# Patient Record
Sex: Male | Born: 1960
Health system: Southern US, Community
[De-identification: ages and names within clinical notes are randomized; demographics above are authoritative.]

## PROBLEM LIST (undated history)

## (undated) DIAGNOSIS — I10 Essential (primary) hypertension: Secondary | ICD-10-CM

## (undated) DIAGNOSIS — J449 Chronic obstructive pulmonary disease, unspecified: Secondary | ICD-10-CM

## (undated) DIAGNOSIS — M069 Rheumatoid arthritis, unspecified: Secondary | ICD-10-CM

## (undated) DIAGNOSIS — K7581 Nonalcoholic steatohepatitis (NASH): Secondary | ICD-10-CM

## (undated) DIAGNOSIS — R899 Unspecified abnormal finding in specimens from other organs, systems and tissues: Secondary | ICD-10-CM

## (undated) DIAGNOSIS — I872 Venous insufficiency (chronic) (peripheral): Secondary | ICD-10-CM

## (undated) DIAGNOSIS — Z993 Dependence on wheelchair: Secondary | ICD-10-CM

## (undated) DIAGNOSIS — R945 Abnormal results of liver function studies: Secondary | ICD-10-CM

## (undated) DIAGNOSIS — E66813 Obesity, class 3: Secondary | ICD-10-CM

## (undated) DIAGNOSIS — R768 Other specified abnormal immunological findings in serum: Secondary | ICD-10-CM

## (undated) DIAGNOSIS — R0602 Shortness of breath: Secondary | ICD-10-CM

## (undated) DIAGNOSIS — E785 Hyperlipidemia, unspecified: Secondary | ICD-10-CM

## (undated) DIAGNOSIS — Z720 Tobacco use: Secondary | ICD-10-CM

## (undated) DIAGNOSIS — G894 Chronic pain syndrome: Secondary | ICD-10-CM

## (undated) DIAGNOSIS — M1990 Unspecified osteoarthritis, unspecified site: Secondary | ICD-10-CM

## (undated) DIAGNOSIS — F418 Other specified anxiety disorders: Secondary | ICD-10-CM

## (undated) DIAGNOSIS — G4733 Obstructive sleep apnea (adult) (pediatric): Secondary | ICD-10-CM

## (undated) HISTORY — DX: Unspecified abnormal finding in specimens from other organs, systems and tissues: R89.9

## (undated) HISTORY — DX: Other specified anxiety disorders: F41.8

## (undated) HISTORY — DX: Obesity, class 3: E66.813

## (undated) HISTORY — DX: Other specified abnormal immunological findings in serum: R76.8

## (undated) HISTORY — DX: Abnormal results of liver function studies: R94.5

## (undated) HISTORY — DX: Hyperlipidemia, unspecified: E78.5

## (undated) HISTORY — DX: Morbid (severe) obesity due to excess calories: E66.01

## (undated) HISTORY — DX: Unspecified osteoarthritis, unspecified site: M19.90

## (undated) HISTORY — DX: Dependence on wheelchair: Z99.3

## (undated) HISTORY — DX: Venous insufficiency (chronic) (peripheral): I87.2

## (undated) HISTORY — DX: Essential (primary) hypertension: I10

## (undated) HISTORY — DX: Tobacco use: Z72.0

## (undated) HISTORY — DX: Obstructive sleep apnea (adult) (pediatric): G47.33

## (undated) HISTORY — DX: Nonalcoholic steatohepatitis (NASH): K75.81

## (undated) HISTORY — DX: Chronic obstructive pulmonary disease, unspecified: J44.9

## (undated) HISTORY — DX: Rheumatoid arthritis, unspecified: M06.9

## (undated) HISTORY — PX: TONSILLECTOMY: SUR1361

## (undated) HISTORY — DX: Chronic pain syndrome: G89.4

---

## 1993-03-25 HISTORY — PX: LAMINECTOMY: SHX219

## 1998-10-04 ENCOUNTER — Emergency Department (HOSPITAL_COMMUNITY): Admission: EM | Admit: 1998-10-04 | Discharge: 1998-10-04 | Payer: Self-pay | Admitting: *Deleted

## 1999-09-28 ENCOUNTER — Encounter: Admission: RE | Admit: 1999-09-28 | Discharge: 1999-09-28 | Payer: Self-pay | Admitting: Neurosurgery

## 1999-09-28 ENCOUNTER — Encounter: Payer: Self-pay | Admitting: Neurosurgery

## 1999-09-29 ENCOUNTER — Encounter: Payer: Self-pay | Admitting: Neurosurgery

## 1999-09-29 ENCOUNTER — Encounter: Admission: RE | Admit: 1999-09-29 | Discharge: 1999-09-29 | Payer: Self-pay | Admitting: Neurosurgery

## 1999-11-16 ENCOUNTER — Encounter: Payer: Self-pay | Admitting: Neurosurgery

## 1999-11-16 ENCOUNTER — Ambulatory Visit (HOSPITAL_COMMUNITY): Admission: RE | Admit: 1999-11-16 | Discharge: 1999-11-16 | Payer: Self-pay | Admitting: Neurosurgery

## 1999-11-30 ENCOUNTER — Encounter: Payer: Self-pay | Admitting: Neurosurgery

## 1999-11-30 ENCOUNTER — Ambulatory Visit (HOSPITAL_COMMUNITY): Admission: RE | Admit: 1999-11-30 | Discharge: 1999-11-30 | Payer: Self-pay | Admitting: Neurosurgery

## 1999-12-14 ENCOUNTER — Ambulatory Visit (HOSPITAL_COMMUNITY): Admission: RE | Admit: 1999-12-14 | Discharge: 1999-12-14 | Payer: Self-pay | Admitting: Neurosurgery

## 1999-12-17 ENCOUNTER — Encounter: Payer: Self-pay | Admitting: Neurosurgery

## 1999-12-17 ENCOUNTER — Ambulatory Visit (HOSPITAL_COMMUNITY): Admission: RE | Admit: 1999-12-17 | Discharge: 1999-12-17 | Payer: Self-pay | Admitting: Neurosurgery

## 2003-12-28 ENCOUNTER — Ambulatory Visit: Payer: Self-pay | Admitting: Family Medicine

## 2005-11-04 ENCOUNTER — Emergency Department (HOSPITAL_COMMUNITY): Admission: EM | Admit: 2005-11-04 | Discharge: 2005-11-04 | Payer: Self-pay | Admitting: Family Medicine

## 2006-03-05 ENCOUNTER — Emergency Department (HOSPITAL_COMMUNITY): Admission: EM | Admit: 2006-03-05 | Discharge: 2006-03-05 | Payer: Self-pay | Admitting: Emergency Medicine

## 2007-03-26 HISTORY — PX: TOTAL HIP ARTHROPLASTY: SHX124

## 2007-11-19 ENCOUNTER — Inpatient Hospital Stay (HOSPITAL_COMMUNITY): Admission: RE | Admit: 2007-11-19 | Discharge: 2007-11-23 | Payer: Self-pay | Admitting: Orthopedic Surgery

## 2008-01-20 ENCOUNTER — Encounter: Admission: RE | Admit: 2008-01-20 | Discharge: 2008-03-24 | Payer: Self-pay | Admitting: Orthopedic Surgery

## 2008-05-18 ENCOUNTER — Encounter: Admission: RE | Admit: 2008-05-18 | Discharge: 2008-06-28 | Payer: Self-pay | Admitting: Orthopedic Surgery

## 2008-06-10 ENCOUNTER — Inpatient Hospital Stay (HOSPITAL_COMMUNITY): Admission: EM | Admit: 2008-06-10 | Discharge: 2008-06-22 | Payer: Self-pay | Admitting: Emergency Medicine

## 2008-06-10 ENCOUNTER — Ambulatory Visit: Payer: Self-pay | Admitting: Internal Medicine

## 2008-06-21 ENCOUNTER — Encounter: Payer: Self-pay | Admitting: Internal Medicine

## 2008-07-11 ENCOUNTER — Encounter: Payer: Self-pay | Admitting: Internal Medicine

## 2008-08-01 ENCOUNTER — Ambulatory Visit: Payer: Self-pay | Admitting: *Deleted

## 2008-08-01 ENCOUNTER — Encounter (INDEPENDENT_AMBULATORY_CARE_PROVIDER_SITE_OTHER): Payer: Self-pay | Admitting: *Deleted

## 2008-08-01 DIAGNOSIS — Z720 Tobacco use: Secondary | ICD-10-CM | POA: Insufficient documentation

## 2008-08-01 DIAGNOSIS — M109 Gout, unspecified: Secondary | ICD-10-CM

## 2008-08-01 DIAGNOSIS — F17200 Nicotine dependence, unspecified, uncomplicated: Secondary | ICD-10-CM

## 2008-08-01 DIAGNOSIS — I10 Essential (primary) hypertension: Secondary | ICD-10-CM | POA: Insufficient documentation

## 2008-08-03 DIAGNOSIS — E785 Hyperlipidemia, unspecified: Secondary | ICD-10-CM | POA: Insufficient documentation

## 2008-08-03 LAB — CONVERTED CEMR LAB
Albumin: 4.1 g/dL (ref 3.5–5.2)
Bacteria, UA: NONE SEEN
Bilirubin Urine: NEGATIVE
CO2: 25 meq/L (ref 19–32)
Calcium: 9.1 mg/dL (ref 8.4–10.5)
Chloride: 102 meq/L (ref 96–112)
Cholesterol: 251 mg/dL — ABNORMAL HIGH (ref 0–200)
GFR calc Af Amer: >60 mL/min (ref >60)
GFR calc non Af Amer: >60 mL/min (ref >60)
Glucose, Bld: 94 mg/dL (ref 70–99)
HDL: 78 mg/dL (ref >39)
Ketones, ur: NEGATIVE mg/dL
LDL Cholesterol: 132 mg/dL — ABNORMAL HIGH (ref 0–99)
Protein, ur: NEGATIVE mg/dL
RBC / HPF: NONE SEEN (ref <3)
Sodium: 141 meq/L (ref 135–145)
Specific Gravity, Urine: 1.021 (ref 1.005–1.030)
Total Bilirubin: 0.4 mg/dL (ref 0.3–1.2)
Total Protein: 7.2 g/dL (ref 6.0–8.3)
Triglycerides: 206 mg/dL — ABNORMAL HIGH (ref <150)
Urine Glucose: NEGATIVE mg/dL
Urobilinogen, UA: 0.2 (ref 0.0–1.0)
VLDL: 41 mg/dL — ABNORMAL HIGH (ref 0–40)
WBC, UA: NONE SEEN cells/hpf (ref <3)

## 2008-08-21 ENCOUNTER — Encounter: Payer: Self-pay | Admitting: Internal Medicine

## 2008-08-21 ENCOUNTER — Ambulatory Visit (HOSPITAL_BASED_OUTPATIENT_CLINIC_OR_DEPARTMENT_OTHER): Admission: RE | Admit: 2008-08-21 | Discharge: 2008-08-21 | Payer: Self-pay | Admitting: *Deleted

## 2008-08-21 DIAGNOSIS — G4733 Obstructive sleep apnea (adult) (pediatric): Secondary | ICD-10-CM

## 2008-08-21 HISTORY — DX: Obstructive sleep apnea (adult) (pediatric): G47.33

## 2008-08-26 ENCOUNTER — Encounter (INDEPENDENT_AMBULATORY_CARE_PROVIDER_SITE_OTHER): Payer: Self-pay | Admitting: *Deleted

## 2008-08-28 ENCOUNTER — Ambulatory Visit: Payer: Self-pay | Admitting: Internal Medicine

## 2008-09-07 ENCOUNTER — Encounter: Admission: RE | Admit: 2008-09-07 | Discharge: 2008-10-10 | Payer: Self-pay | Admitting: Orthopedic Surgery

## 2008-11-16 ENCOUNTER — Ambulatory Visit: Payer: Self-pay | Admitting: Internal Medicine

## 2008-11-16 DIAGNOSIS — J449 Chronic obstructive pulmonary disease, unspecified: Secondary | ICD-10-CM | POA: Insufficient documentation

## 2008-11-16 DIAGNOSIS — G4733 Obstructive sleep apnea (adult) (pediatric): Secondary | ICD-10-CM | POA: Insufficient documentation

## 2008-12-19 ENCOUNTER — Ambulatory Visit: Payer: Self-pay | Admitting: Infectious Diseases

## 2009-03-03 ENCOUNTER — Ambulatory Visit: Payer: Self-pay | Admitting: Infectious Diseases

## 2009-03-03 ENCOUNTER — Ambulatory Visit (HOSPITAL_COMMUNITY): Admission: RE | Admit: 2009-03-03 | Discharge: 2009-03-03 | Payer: Self-pay | Admitting: Infectious Diseases

## 2009-03-03 LAB — CONVERTED CEMR LAB
Bilirubin Urine: NEGATIVE
Ketones, ur: NEGATIVE mg/dL
MCHC: 33.2 g/dL (ref 30.0–36.0)
MCV: 94.8 fL (ref >=78.0)
Nitrite: NEGATIVE
Platelets: 238 10*3/uL (ref 150–400)
Urobilinogen, UA: 0.2 (ref 0.0–1.0)
pH: 6.5 (ref 5.0–8.0)

## 2009-05-10 ENCOUNTER — Encounter: Admission: RE | Admit: 2009-05-10 | Discharge: 2009-05-10 | Payer: Self-pay | Admitting: Orthopedic Surgery

## 2009-06-23 ENCOUNTER — Encounter: Payer: Self-pay | Admitting: Internal Medicine

## 2009-06-29 ENCOUNTER — Encounter: Payer: Self-pay | Admitting: Internal Medicine

## 2009-07-15 ENCOUNTER — Encounter: Admission: RE | Admit: 2009-07-15 | Discharge: 2009-07-15 | Payer: Self-pay | Admitting: Orthopedic Surgery

## 2009-08-07 ENCOUNTER — Encounter: Payer: Self-pay | Admitting: Internal Medicine

## 2009-08-17 ENCOUNTER — Encounter
Admission: RE | Admit: 2009-08-17 | Discharge: 2009-08-17 | Payer: Self-pay | Admitting: Physical Medicine and Rehabilitation

## 2009-09-27 ENCOUNTER — Ambulatory Visit: Payer: Self-pay | Admitting: Internal Medicine

## 2009-09-28 LAB — CONVERTED CEMR LAB
Bacteria, UA: NONE SEEN
Bilirubin Urine: NEGATIVE
Casts: NONE SEEN /lpf
Crystals: NONE SEEN
Hemoglobin, Urine: NEGATIVE
Specific Gravity, Urine: >1.045 — ABNORMAL HIGH (ref 1.005–1.0)
pH: 6 (ref 5.0–8.0)

## 2009-10-25 ENCOUNTER — Encounter: Payer: Self-pay | Admitting: Internal Medicine

## 2009-12-25 ENCOUNTER — Ambulatory Visit (HOSPITAL_COMMUNITY): Admission: RE | Admit: 2009-12-25 | Discharge: 2009-12-25 | Payer: Self-pay | Admitting: Orthopedic Surgery

## 2010-01-26 ENCOUNTER — Encounter: Payer: Self-pay | Admitting: Internal Medicine

## 2010-01-30 ENCOUNTER — Telehealth (INDEPENDENT_AMBULATORY_CARE_PROVIDER_SITE_OTHER): Payer: Self-pay | Admitting: *Deleted

## 2010-01-31 ENCOUNTER — Ambulatory Visit: Payer: Self-pay | Admitting: Internal Medicine

## 2010-01-31 DIAGNOSIS — E1169 Type 2 diabetes mellitus with other specified complication: Secondary | ICD-10-CM

## 2010-01-31 LAB — CONVERTED CEMR LAB
ALT: 52 units/L (ref 0–53)
BUN: 8 mg/dL (ref 6–23)
Blood Glucose, Fingerstick: 264
CO2: 27 meq/L (ref 19–32)
Calcium: 9.8 mg/dL (ref 8.4–10.5)
Chloride: 96 meq/L (ref 96–112)
Cholesterol: 221 mg/dL — ABNORMAL HIGH (ref 0–200)
Creatinine, Ser: 0.79 mg/dL (ref 0.40–1.50)
Microalb Creat Ratio: 10.4 mg/g (ref 0.0–30.0)
Microalb, Ur: 1.64 mg/dL (ref 0.00–1.89)
VLDL: 64 mg/dL — ABNORMAL HIGH (ref 0–40)

## 2010-01-31 LAB — HM DIABETES FOOT EXAM

## 2010-02-26 ENCOUNTER — Telehealth (INDEPENDENT_AMBULATORY_CARE_PROVIDER_SITE_OTHER): Payer: Self-pay | Admitting: *Deleted

## 2010-02-28 ENCOUNTER — Ambulatory Visit: Payer: Self-pay

## 2010-04-06 ENCOUNTER — Encounter: Payer: Self-pay | Admitting: Internal Medicine

## 2010-04-15 ENCOUNTER — Encounter: Payer: Self-pay | Admitting: Physical Medicine and Rehabilitation

## 2010-04-15 ENCOUNTER — Encounter: Payer: Self-pay | Admitting: Orthopedic Surgery

## 2010-04-24 NOTE — Letter (Signed)
Summary: CMN - ADVANCED   CMN - ADVANCED   Imported By: Enedina Finner 02/13/2010 11:53:11  _____________________________________________________________________  External Attachment:    Type:   Image     Comment:   External Document

## 2010-04-24 NOTE — Progress Notes (Signed)
Summary: CDE appointment confirmed/dmr  Phone Note Call from Patient Call back at Home Phone [phone]   Caller: Patient Summary of Call: left voicemail about card for diabetes that someone sent him- called patient and confirmed that he does have an appointment on 12/7 with CDE and to bring his meter.  Initial call taken by: Barnabas Harries RD,CDE,  February 26, 2010 4:34 PM

## 2010-04-24 NOTE — Letter (Signed)
Summary: ORTHOTIC AND PROSTHETIC DEVICES  ORTHOTIC AND PROSTHETIC DEVICES   Imported By: Garlan Fillers 06/30/2009 11:38:06  _____________________________________________________________________  External Attachment:    Type:   Image     Comment:   External Document

## 2010-04-24 NOTE — Assessment & Plan Note (Signed)
Summary: EST-CK/FU/MEDS/CFB   Vital Signs:  Patient profile:   50 year old male Height:      68.5 inches (173.99 cm) Weight:      326.01 pounds (148.19 kg) BMI:     49.03 Temp:     98.4 degrees F (36.89 degrees C) oral Pulse rate:   97 / minute BP sitting:   123 / 73  (right arm)  Vitals Entered By: Sander Nephew RN (September 27, 2009 2:35 PM) CC: Depression, Back Pain Is Patient Diabetic? No Pain Assessment Patient in pain? yes     Location: back, legs Intensity: 10 Type: , throbbing Onset of pain  Constant Nutritional Status BMI of > 30 = obese  Have you ever been in a relationship where you felt threatened, hurt or afraid?No   Does patient need assistance? Functional Status Self care Ambulation Wheelchair Comments When he pees it burns.  Does'nt happen all the time. Blister on  his arms.  Knot on his stomach still has a knot on it.  Needs labs drawn for Mental Health.  Needs some urine jugs if possible.  Dry mouth a lot last 6 to 8 months.   Primary Care Provider:  [name] MD  CC:  Depression and Back Pain.  History of Present Illness: Patient is a 50 yo male with PMH of OSA, HTN, tobacco abuse, morbid obesity came here for regular f/u and depression. He said his depression has been well controlled until sevral months ag his father died, now he thought depression better, no SI/HI. He also reported intermittent urgency and dysuria, no fever. He continues smoking, but has cut to 1/2 ppd, denies CP or SOB. He denies ETOH or drug use, and has tried to lose weight about 20 lbs over 6 months.    Depression History:      The patient is having a depressed mood most of the day.        Suicide risk questions reveal that he wishes that he were dead and he has thought about ending his life.  The patient denies that he feels like life is not worth living and denies that he has planned how to end his life.        Comments:  REcently lost her father.  Down about being in the wheel  chair.    Preventive Screening-Counseling & Management  Alcohol-Tobacco     Smoking Status: current     Smoking Cessation Counseling: yes     Packs/Day: 0.5     Year Started: 2003  Comments: Cutting back since December.  Problems Prior to Update: 1)  Urinary Urgency  (DDU-202.54) 2)  Dizziness and Giddiness  (ICD-780.4) 3)  Uri  (ICD-465.9) 4)  Chronic Obstructive Pulmonary Disease  (ICD-496) 5)  Obstructive Sleep Apnea  (ICD-327.23) 6)  Hyperlipidemia  (ICD-272.4) 7)  Skin Lesion  (ICD-709.9) 8)  Morbid Obesity  (ICD-278.01) 9)  Hypertension  (ICD-401.9) 10)  Depression  (ICD-311) 11)  Osteoarthritis  (ICD-715.90) 12)  Hip Replacement, Left, Hx of  (ICD-V43.64) 13)  Gout, Unspecified  (ICD-274.9) 14)  Tobacco Abuse  (ICD-305.1) 15)  Transaminases, Serum, Elevated  (ICD-790.4) 16)  Back Pain, Chronic  (ICD-724.5)  Medications Prior to Update: 1)  Bayer Low Strength 81 Mg Tbec (Aspirin) .... Take One Tablet By Mouth Once Daily 2)  Prozac 20 Mg Caps (Fluoxetine Hcl) .... Take One Tablet By Mouth Once Daily 3)  Amlodipine Besylate 10 Mg Tabs (Amlodipine Besylate) .... Take One Tablet By Mouth  Once Daily 4)  Advair Diskus 100-50 Mcg/dose  Misc (Fluticasone-Salmeterol) .... One Puff Twice Daily 5)  Ventolin Hfa 108 (90 Base) Mcg/act  Aers (Albuterol Sulfate) .Marland Kitchen.. 1-2 Puffs Every 4-6 Hours As Needed For Shortness of Breath 6)  Lisinopril 20 Mg Tabs (Lisinopril) .... Take 1 Tablet By Mouth Once A Day 7)  Alprazolam 0.5 Mg Tabs (Alprazolam) .... Take 1 Tablet By Mouth Three Times A Day An Needed For Anxiety 8)  Cvs Omeprazole 20 Mg Tbec (Omeprazole) .... Take 1 Tablet By Mouth Once A Day 9)  Meclizine Hcl 25 Mg Chew (Meclizine Hcl) .... Take One Tablet Two Times A Day  Current Medications (verified): 1)  Bayer Low Strength 81 Mg Tbec (Aspirin) .... Take One Tablet By Mouth Once Daily 2)  Prozac 20 Mg Caps (Fluoxetine Hcl) .... Take One Tablet By Mouth Once Daily 3)  Advair  Diskus 100-50 Mcg/dose  Misc (Fluticasone-Salmeterol) .... One Puff Twice Daily 4)  Ventolin Hfa 108 (90 Base) Mcg/act  Aers (Albuterol Sulfate) .Marland Kitchen.. 1-2 Puffs Every 4-6 Hours As Needed For Shortness of Breath 5)  Alprazolam 0.5 Mg Tabs (Alprazolam) .... Take 1 Tablet By Mouth Three Times A Day An Needed For Anxiety 6)  Cvs Omeprazole 20 Mg Tbec (Omeprazole) .... Take 1 Tablet By Mouth Once A Day  Allergies (verified): No Known Drug Allergies  Past History:  Past Medical History: Last updated: 08/01/2008 1. Atypical pneumonia, hospitalized 06/10/2008 2. Respiratory distress secondary to chronic obstructive pulmonary     disease or asthma exacerbation. 3. Transaminitis. 4. Hypertension. 5. Tobacco abuse. 6. Depression and anxiety. 7. Morbid obesity. 8. Osteoarthritis. 9. Gout. 10.Carpal tunnel syndrome. 11.Status post left hip replacement.   Social History: Last updated: 11/16/2008 smokes 1/2 ppd EtOH: about once a week has a 6pack. Max last month 12 beers. Denies withdrawal, eye openers, adverse consequences Remote MJ, cocaine  Risk Factors: Exercise: yes (03/03/2009)  Risk Factors: Smoking Status: current (09/27/2009) Packs/Day: 0.5 (09/27/2009)  Social History: Reviewed history from 11/16/2008 and no changes required. smokes 1/2 ppd EtOH: about once a week has a 6pack. Max last month 12 beers. Denies withdrawal, eye openers, adverse consequences Remote MJ, cocainePacks/Day:  0.5  Review of Systems  The patient denies fever, dyspnea on exertion, peripheral edema, prolonged cough, headaches, hemoptysis, and abdominal pain.    Physical Exam  General:  alert, well-developed, well-nourished, well-hydrated, and overweight-appearing.   Head:  normocephalic.   Eyes:  pupils reactive to light.   Nose:  no nasal discharge.   Mouth:  pharynx pink and moist.   Neck:  supple.   Lungs:  normal respiratory effort, no accessory muscle use, normal breath sounds, no fremitus,  no crackles, and no wheezes.   Heart:  normal rate, regular rhythm, no murmur, no gallop, and no rub.   Abdomen:  soft, non-tender, normal bowel sounds, and no distention.   Msk:  normal ROM, no joint tenderness, no joint swelling, and no joint warmth.   Pulses:  2+ Extremities:  No pitting edema. Neurologic:  alert & oriented X3, cranial nerves II-XII intact, strength normal in all extremities, and sensation intact to light touch.     Impression & Recommendations:  Problem # 1:  CHRONIC OBSTRUCTIVE PULMONARY DISEASE (ICD-496) Assessment Improved He has COPD on bronchodilators and steroids, no excerbation since last discharge. Glad he lost 20 lbs. Current smoker, advised him to quit smoking and weight loss. He would be happy to do it.  His updated medication list for this problem  includes:    Advair Diskus 100-50 Mcg/dose Misc (Fluticasone-salmeterol) ..... One puff twice daily    Ventolin Hfa 108 (90 Base) Mcg/act Aers (Albuterol sulfate) .Marland Kitchen... 1-2 puffs every 4-6 hours as needed for shortness of breath  Problem # 2:  DEPRESSION (ICD-311) Assessment: Improved His dad's recent death made his depression worse, but now he feels better. Will continue current meds including seroqel. Will refill for him xanax.   His updated medication list for this problem includes:    Prozac 20 Mg Caps (Fluoxetine hcl) .Marland Kitchen... Take one tablet by mouth once daily    Alprazolam 0.5 Mg Tabs (Alprazolam) .Marland Kitchen... Take 1 tablet by mouth three times a day an needed for anxiety  Discussed treatment options, including trial of antidpressant medication. Will refer to behavioral health. Follow-up call in in 24-48 hours and recheck in 2 weeks, sooner as needed. Patient agrees to call if any worsening of symptoms or thoughts of doing harm arise. Verified that the patient has no suicidal ideation at this time.   Problem # 3:  HYPERLIPIDEMIA (NTI-144.4) Assessment: Comment Only Will check FLP and his psycho doc. lwill need lab  result faxed to them. Orders: T-Lipid Profile [id number]) T-CMP with Estimated GFR ([id number])  Labs Reviewed: SGOT: 33 (08/01/2008)   SGPT: 46 (08/01/2008)   HDL:78 (08/01/2008)  LDL:132 (08/01/2008)  Chol:251 (08/01/2008)  Trig:206 (08/01/2008)  Problem # 4:  URINARY URGENCY (TIW-580.99) Assessment: Unchanged He has urinary dysuria and frequency. Will check UA to r/o UTI. He may have prostatitis.  Orders: T-Urinalysis ([id number])  Problem # 5:  TOBACCO ABUSE (ICD-305.1) Assessment: Comment Only  Encouraged smoking cessation and discussed different methods for smoking cessation. He would like to consider cut and stop eventually.   Complete Medication List: 1)  Bayer Low Strength 81 Mg Tbec (Aspirin) .... Take one tablet by mouth once daily 2)  Prozac 20 Mg Caps (Fluoxetine hcl) .... Take one tablet by mouth once daily 3)  Advair Diskus 100-50 Mcg/dose Misc (Fluticasone-salmeterol) .... One puff twice daily 4)  Ventolin Hfa 108 (90 Base) Mcg/act Aers (Albuterol sulfate) .Marland Kitchen.. 1-2 puffs every 4-6 hours as needed for shortness of breath 5)  Alprazolam 0.5 Mg Tabs (Alprazolam) .... Take 1 tablet by mouth three times a day an needed for anxiety 6)  Cvs Omeprazole 20 Mg Tbec (Omeprazole) .... Take 1 tablet by mouth once a day 7)  Seroquel Xr 50 Mg Xr24h-tab (Quetiapine fumarate) .... Take 1 tablet by mouth once a day  Patient Instructions: 1)  Please schedule a follow-up appointment in 6 months. 2)  We will call you if any abnormal labs. 3)  Tobacco is very bad for your health and your loved ones! You Should stop smoking!. 4)  Stop Smoking Tips: Choose a Quit date. Cut down before the Quit date. decide what you will do as a substitute when you feel the urge to smoke(gum,toothpick,exercise). 5)  It is important that you exercise regularly at least 20 minutes 5 times a week. If you develop chest pain, have severe difficulty breathing, or feel very tired , stop exercising immediately  and seek medical attention. 6)  You need to lose weight. Consider a lower calorie diet and regular exercise.  Prescriptions: ALPRAZOLAM 0.5 MG TABS (ALPRAZOLAM) Take 1 tablet by mouth three times a day an needed for anxiety  #60 x 0   Entered and Authorized by:   [name] MD   Signed by:   [name] MD on 09/27/2009   Method used:  Print then Give to Patient   RxID:   [id number]   Prevention & Chronic Care Immunizations   Influenza vaccine: Not documented   Influenza vaccine due: 11/23/2008    Tetanus booster: Not documented   Td booster deferral: Deferred  (03/03/2009)    Pneumococcal vaccine: Not documented  Other Screening   Smoking status: current  (09/27/2009)   Smoking cessation counseling: yes  (09/27/2009)  Lipids   Total Cholesterol: 251  (08/01/2008)   Lipid panel action/deferral: Lipid Panel ordered   LDL: 132  (08/01/2008)   LDL Direct: Not documented   HDL: 78  (08/01/2008)   Triglycerides: 206  (08/01/2008)    SGOT (AST): 33  (08/01/2008)   SGPT (ALT): 46  (08/01/2008)   Alkaline phosphatase: 192  (08/01/2008)   Total bilirubin: 0.4  (08/01/2008)    Lipid flowsheet reviewed?: Yes   Progress toward LDL goal: Unchanged  Hypertension   Last Blood Pressure: 123 / 73  (09/27/2009)   Serum creatinine: 0.87  (08/01/2008)   Serum potassium 4.4  (08/01/2008)    Hypertension flowsheet reviewed?: Yes   Progress toward BP goal: At goal  Self-Management Support :   Personal Goals (by the next clinic visit) :      Personal blood pressure goal: 140/90  (12/19/2008)     Personal LDL goal: 130  (12/19/2008)    Patient will work on the following items until the next clinic visit to reach self-care goals:     Medications and monitoring: take my medicines every day, bring all of my medications to every visit  (09/27/2009)     Eating: drink diet soda or water instead of juice or soda, eat more vegetables, use fresh or frozen vegetables, eat foods  that are low in salt, eat baked foods instead of fried foods, eat fruit for snacks and desserts, limit or avoid alcohol  (09/27/2009)    Hypertension self-management support: Written self-care plan, Education handout, Pre-printed educational material, Resources for patients handout  (09/27/2009)   Hypertension self-care plan printed.   Hypertension education handout printed    Lipid self-management support: Written self-care plan, Education handout, Pre-printed educational material, Resources for patients handout  (09/27/2009)   Lipid self-care plan printed.   Lipid education handout printed      Resource handout printed.  Process Orders Check Orders Results:     [facility]: QAE not required for this insurance Tests Sent for requisitioning (September 28, 2009 6:18 PM):     09/27/2009: [facility] -- T-Lipid Profile [id number] (signed)     09/27/2009: [facility] -- T-CMP with Estimated GFR [[id number]] (signed)     09/27/2009: [facility] -- T-Urinalysis [[id number]] (signed)     Vital Signs:  Patient profile:   50 year old male Height:      68.5 inches (173.99 cm) Weight:      326.01 pounds (148.19 kg) BMI:     49.03 Temp:     98.4 degrees F (36.89 degrees C) oral Pulse rate:   97 / minute BP sitting:   123 / 73  (right arm)  Vitals Entered By: Sander Nephew RN (September 27, 2009 2:35 PM)

## 2010-04-24 NOTE — Progress Notes (Signed)
Summary: PREVENTIVE COLONOSCOPY  Phone Note Outgoing Call   Summary of Call: Patient is under the age of 55.  No Info on Colonoscopy at this time. Initial call taken by: Enedina Finner,  January 30, 2010 4:54 PM

## 2010-04-24 NOTE — Assessment & Plan Note (Signed)
Summary: CHECKUP/SB.   Vital Signs:  Patient profile:   50 year old male Height:      68.5 inches (173.99 cm) Weight:      310.0 pounds (140.91 kg) BMI:     46.62 Temp:     97.2 degrees F (36.22 degrees C) oral Pulse rate:   91 / minute BP sitting:   132 / 95  (right arm) Cuff size:   large  Vitals Entered By: Mateo Flow Deborra Medina) (January 31, 2010 10:50 AM) CC: f/u elevated blood glucoses Is Patient Diabetic? Yes Did you bring your meter with you today? none-rx given today Pain Assessment Patient in pain? no      Nutritional Status BMI of > 30 = obese CBG Result 264  Have you ever been in a relationship where you felt threatened, hurt or afraid?No   Does patient need assistance? Functional Status Cook/clean Ambulation Impaired:Risk for fall, Wheelchair Comments uses a cane, arrived via w/c   Diabetic Foot Exam Foot Inspection Is there a history of a foot ulcer?              No Is there a foot ulcer now?              No Is there swelling or an abnormal foot shape?          No Are the toenails long?                No Are the toenails thick?                No Are the toenails ingrown?              No Is there heavy callous build-up?              No Is there pain in the calf muscle (Intermittent claudication) when walking?    NoIs there a claw toe deformity?              No Is there elevated skin temperature?            No Is there limited ankle dorsiflexion?            No Is there foot or ankle muscle weakness?            No  Diabetic Foot Care Education Patient educated on appropriate care of diabetic feet.  Pulse Check          Right Foot          Left Foot Posterior Tibial:        normal            normal Dorsalis Pedis:        normal            normal  High Risk Feet? No Set Next Diabetic Foot Exam here: 01/30/2011   10-g (5.07) Semmes-Weinstein Monofilament Test Performed by: Adline Peals          Right Foot          Left Foot Visual Inspection      normal           normal Test Control      normal         normal Site 1         normal         normal Site 2         normal         normal  Site 3         normal         normal Site 4         normal         normal Site 5         normal         normal Site 6         normal         normal  Impression      normal         normal   Primary Care Provider:  [name] MD  CC:  f/u elevated blood glucoses.  History of Present Illness: Patient is a 50 yo male with PMH of OSA, HTN, tobacco abuse and morbid obesity who came here for regular f/u. He has been doing well, occasionally has nocturia, no significant thirsty, polyuria or frequency. He has been trying to eat more vegetable and lost about 16 lbs. Still has exertional SOB, no chest pain, adbominal pain, fever, or diarrhea. No dizziness or palpitationm. Current smoker about 1/2 PPD, denies ETOH or drug abuse. Depression is stable on prozac. No SI/HI.   Preventive Screening-Counseling & Management  Alcohol-Tobacco     Smoking Status: current     Smoking Cessation Counseling: yes     Packs/Day: 0.5     Year Started: 2003  Problems Prior to Update: 1)  Glycosuria  (ICD-791.5) 2)  Urinary Urgency  (ICD-788.63) 3)  Dizziness and Giddiness  (ICD-780.4) 4)  Uri  (ICD-465.9) 5)  Chronic Obstructive Pulmonary Disease  (ICD-496) 6)  Obstructive Sleep Apnea  (ICD-327.23) 7)  Hyperlipidemia  (ICD-272.4) 8)  Skin Lesion  (ICD-709.9) 9)  Morbid Obesity  (ICD-278.01) 10)  Hypertension  (ICD-401.9) 11)  Depression  (ICD-311) 12)  Osteoarthritis  (ICD-715.90) 13)  Hip Replacement, Left, Hx of  (ICD-V43.64) 14)  Gout, Unspecified  (ICD-274.9) 15)  Tobacco Abuse  (ICD-305.1) 16)  Transaminases, Serum, Elevated  (ICD-790.4) 17)  Back Pain, Chronic  (ICD-724.5)  Medications Prior to Update: 1)  Bayer Low Strength 81 Mg Tbec (Aspirin) .... Take One Tablet By Mouth Once Daily 2)  Prozac 20 Mg Caps (Fluoxetine Hcl) .... Take One Tablet By Mouth  Once Daily 3)  Advair Diskus 100-50 Mcg/dose  Misc (Fluticasone-Salmeterol) .... One Puff Twice Daily 4)  Ventolin Hfa 108 (90 Base) Mcg/act  Aers (Albuterol Sulfate) .Marland Kitchen.. 1-2 Puffs Every 4-6 Hours As Needed For Shortness of Breath 5)  Alprazolam 0.5 Mg Tabs (Alprazolam) .... Take 1 Tablet By Mouth Three Times A Day An Needed For Anxiety 6)  Cvs Omeprazole 20 Mg Tbec (Omeprazole) .... Take 1 Tablet By Mouth Once A Day 7)  Seroquel Xr 50 Mg Xr24h-Tab (Quetiapine Fumarate) .... Take 1 Tablet By Mouth Once A Day  Current Medications (verified): 1)  Bayer Low Strength 81 Mg Tbec (Aspirin) .... Take One Tablet By Mouth Once Daily 2)  Prozac 20 Mg Caps (Fluoxetine Hcl) .... Take One Tablet By Mouth Once Daily 3)  Advair Diskus 100-50 Mcg/dose  Misc (Fluticasone-Salmeterol) .... One Puff Twice Daily 4)  Ventolin Hfa 108 (90 Base) Mcg/act  Aers (Albuterol Sulfate) .Marland Kitchen.. 1-2 Puffs Every 4-6 Hours As Needed For Shortness of Breath 5)  Alprazolam 0.5 Mg Tabs (Alprazolam) .... Take 1 Tablet By Mouth Three Times A Day An Needed For Anxiety 6)  Cvs Omeprazole 20 Mg Tbec (Omeprazole) .... Take 1 Tablet By Mouth Once A Day 7)  Prodigy No Coding Blood Gluc  Strp (Glucose Blood) .... Check Your Sugar Twice A Day 8)  Prodigy Lancets 26g  Misc (Lancets) .... Check Sugar Two Times A Day 9)  Prodigy Preferred Monitor  Devi (Blood Glucose Monitoring Suppl) .... Use As Directed. 10)  Lisinopril 10 Mg Tabs (Lisinopril) .... Take 1 Tablet By Mouth Once A Day 11)  Metformin Hcl 500 Mg Tabs (Metformin Hcl) .... Take 1 Tablet By Mouth Two Times A Day  Allergies (verified): No Known Drug Allergies  Past History:  Past Medical History: Last updated: 08/01/2008 1. Atypical pneumonia, hospitalized 06/10/2008 2. Respiratory distress secondary to chronic obstructive pulmonary     disease or asthma exacerbation. 3. Transaminitis. 4. Hypertension. 5. Tobacco abuse. 6. Depression and anxiety. 7. Morbid obesity. 8.  Osteoarthritis. 9. Gout. 10.Carpal tunnel syndrome. 11.Status post left hip replacement.   Family History: Last updated: 01/31/2010 Parents have DM.   Social History: Last updated: 11/16/2008 smokes 1/2 ppd EtOH: about once a week has a 6pack. Max last month 12 beers. Denies withdrawal, eye openers, adverse consequences Remote MJ, cocaine  Risk Factors: Smoking Status: current (01/31/2010) Packs/Day: 0.5 (01/31/2010)  Family History: Parents have DM.   Social History: Reviewed history from 11/16/2008 and no changes required. smokes 1/2 ppd EtOH: about once a week has a 6pack. Max last month 12 beers. Denies withdrawal, eye openers, adverse consequences Remote MJ, cocaine  Review of Systems       The patient complains of weight loss, dyspnea on exertion, and prolonged cough.  The patient denies anorexia, fever, chest pain, syncope, peripheral edema, hemoptysis, abdominal pain, melena, and hematochezia.    Physical Exam  General:  alert, well-developed, well-nourished, well-hydrated, and overweight-appearing.  Sitting in wheel chair. Head:  normocephalic.   Nose:  no nasal discharge.   Mouth:  pharynx pink and moist.   Neck:  supple.   Lungs:  normal respiratory effort, no accessory muscle use, normal breath sounds, and no crackles. Very mild wheezing.   Heart:  normal rate, no murmur, and no JVD.   Abdomen:  soft, non-tender, normal bowel sounds, no distention, and no masses.   Msk:  normal ROM, no joint tenderness, no joint swelling, and no joint warmth.   Pulses:  2+ Extremities:  trace left pedal edema and trace right pedal edema.   Neurologic:  alert & oriented X3, cranial nerves II-XII intact, strength normal in all extremities, sensation intact to light touch, gait normal, and DTRs symmetrical and normal.    Diabetes Management Exam:    Foot Exam (with socks and/or shoes not present):       Sensory-Monofilament:          Left foot: normal          Right foot:  normal   Impression & Recommendations:  Problem # 1:  DM (ICD-250.00) Assessment New His CBG 264 and A1C 9.6. This is newly diagnosed DM. Will start metformin and ACEIs. Provided DM education and side effects of these medications in detail. He fully understands these. Also have DM referral for detailed DM education.  His updated medication list for this problem includes:    Bayer Low Strength 81 Mg Tbec (Aspirin) .Marland Kitchen... Take one tablet by mouth once daily    Lisinopril 10 Mg Tabs (Lisinopril) .Marland Kitchen... Take 1 tablet by mouth once a day    Metformin Hcl 500 Mg Tabs (Metformin hcl) .Marland Kitchen... Take 1 tablet by mouth two times a day  Labs Reviewed: Creat: 0.87 (08/01/2008)     Orders: T-Urine  Microalbumin w/creat. ratio [id number]) T-CMP with Estimated GFR ([id number]) Diabetic Clinic Referral (Diabetic)  Labs Reviewed: Creat: 0.87 (08/01/2008)    Reviewed HgBA1c results: 9.6 (01/31/2010)  Problem # 2:  HYPERTENSION (ICD-401.9) Assessment: Deteriorated Since he has newly diagnosed DM, will start low dose of lisinopril for HTN and kidney protection. Recheck BP at next visit. BP today: 132/95 Prior BP: 123/73 (09/27/2009)  Labs Reviewed: K+: 4.4 (08/01/2008) Creat: : 0.87 (08/01/2008)   Chol: 251 (08/01/2008)   HDL: 78 (08/01/2008)   LDL: 132 (08/01/2008)   TG: 206 (08/01/2008)  His updated medication list for this problem includes:    Lisinopril 10 Mg Tabs (Lisinopril) .Marland Kitchen... Take 1 tablet by mouth once a day  Problem # 3:  CHRONIC OBSTRUCTIVE PULMONARY DISEASE (ICD-496) Assessment: Unchanged Stable, very mild wheezing, will continue bronchodilators and advised quit smoking. He would like to cut soon.  His updated medication list for this problem includes:    Advair Diskus 100-50 Mcg/dose Misc (Fluticasone-salmeterol) ..... One puff twice daily    Ventolin Hfa 108 (90 Base) Mcg/act Aers (Albuterol sulfate) .Marland Kitchen... 1-2 puffs every 4-6 hours as needed for shortness of breath  Problem  # 4:  TOBACCO ABUSE (ICD-305.1) Assessment: Unchanged  Encouraged smoking cessation and discussed different methods for smoking cessation. He would like to cut soon.  Problem # 5:  HYPERLIPIDEMIA (GHW-299.4) Assessment: Unchanged  Will check FLP and start zocor.  Orders: T-Lipid Profile [phone] [id number]) T-CMP with Estimated GFR ([id number])  Labs Reviewed: SGOT: 33 (08/01/2008)   SGPT: 46 (08/01/2008)   HDL:78 (08/01/2008)  LDL:132 (08/01/2008)  Chol:251 (08/01/2008)  Trig:206 (08/01/2008)  His updated medication list for this problem includes:    Zocor 40 Mg Tab (Simvastatin) .Marland Kitchen... Take 1 tablet by mouth each evening  Complete Medication List: 1)  Bayer Low Strength 81 Mg Tbec (Aspirin) .... Take one tablet by mouth once daily 2)  Prozac 20 Mg Caps (Fluoxetine hcl) .... Take one tablet by mouth once daily 3)  Advair Diskus 100-50 Mcg/dose Misc (Fluticasone-salmeterol) .... One puff twice daily 4)  Ventolin Hfa 108 (90 Base) Mcg/act Aers (Albuterol sulfate) .Marland Kitchen.. 1-2 puffs every 4-6 hours as needed for shortness of breath 5)  Alprazolam 0.5 Mg Tabs (Alprazolam) .... Take 1 tablet by mouth three times a day an needed for anxiety 6)  Cvs Omeprazole 20 Mg Tbec (Omeprazole) .... Take 1 tablet by mouth once a day 7)  Prodigy No Coding Blood Gluc Strp (Glucose blood) .... Check your sugar twice a day 8)  Prodigy Lancets 26g Misc (Lancets) .... Check sugar two times a day 9)  Prodigy Preferred Monitor Devi (Blood glucose monitoring suppl) .... Use as directed. 10)  Lisinopril 10 Mg Tabs (Lisinopril) .... Take 1 tablet by mouth once a day 11)  Metformin Hcl 500 Mg Tabs (Metformin hcl) .... Take 1 tablet by mouth two times a day 12)  Zocor 40 Mg Tab (Simvastatin) .... Take 1 tablet by mouth each evening  Other Orders: T-Hgb A1C (in-house) ([id number]) T- Capillary Blood Glucose (24235)  Patient Instructions: 1)  Please schedule a follow-up appointment in 3 months. 2)  Tobacco is very bad for  your health and your loved ones! You Should stop smoking!. 3)  Stop Smoking Tips: Choose a Quit date. Cut down before the Quit date. decide what you will do as a substitute when you feel the urge to smoke(gum,toothpick,exercise). 4)  It is important that you exercise regularly at least 20 minutes 5 times a week. If you develop chest  pain, have severe difficulty breathing, or feel very tired , stop exercising immediately and seek medical attention. 5)  You need to lose weight. Consider a lower calorie diet and regular exercise.  6)  Check your blood sugars regularly. If your readings are usually above : or below 70 you should contact our office. 7)  Check your feet each night for sore areas, calluses or signs of infection. Prescriptions: ZOCOR 40 MG TAB (SIMVASTATIN) Take 1 tablet by mouth each evening  #30 x 3   Entered and Authorized by:   [name] MD   Signed by:   [name] MD on 01/31/2010   Method used:   Electronically to        [facility]. [phone]* (retail)       [address] [place]  [address]       Ph: [phone] or [phone]       Fax: [phone]   RxID:   [id number] METFORMIN HCL 500 MG TABS (METFORMIN HCL) Take 1 tablet by mouth two times a day  #60 x 5   Entered and Authorized by:   [name] MD   Signed by:   [name] MD on 01/31/2010   Method used:   Electronically to        [facility]. [phone]* (retail)       [address] [place]  [address]       Ph: [phone] or [phone]       Fax: [phone]   RxID:   [id number] ALPRAZOLAM 0.5 MG TABS (ALPRAZOLAM) Take 1 tablet by mouth three times a day an needed for anxiety  #60 x 1   Entered and Authorized by:   [name] MD   Signed by:   [name] MD on 01/31/2010   Method used:   Print then Give to Patient   RxID:   [id number] LISINOPRIL 10 MG TABS (LISINOPRIL) Take 1 tablet by mouth once a day  #30 x 5   Entered and  Authorized by:   [name] MD   Signed by:   [name] MD on 01/31/2010   Method used:   Electronically to        [facility]. [phone]* (retail)       [address] [place]  [address]       Ph: [phone] or [phone]       Fax: [phone]   RxID:   [id number] PRODIGY PREFERRED MONITOR  DEVI (BLOOD GLUCOSE MONITORING SUPPL) use as directed.  #1 x 1   Entered and Authorized by:   [name] MD   Signed by:   [name] MD on 01/31/2010   Method used:   Electronically to        [facility]. [phone]* (retail)       [address], [place]  [address]       Ph: [phone] or [phone]       Fax: [phone]   RxID:   [id number] PRODIGY LANCETS 26G  MISC (LANCETS) check sugar two times a day  #60 x 5   Entered and Authorized by:   [name] MD   Signed by:   [name] MD on 01/31/2010   Method used:   Electronically to  [facility]. [phone]* (retail)       [phone] [place]  [address]       Ph: [phone] or [phone]       Fax: [phone]   RxID:   [id number] PRODIGY NO CODING BLOOD GLUC  STRP (GLUCOSE BLOOD) check your sugar twice a day  #60 x 4   Entered and Authorized by:   [name] MD   Signed by:   [name] MD on 01/31/2010   Method used:   Electronically to        [facility]. [phone]* (retail)       [address] [place]  [address]       Ph: [phone] or [phone]       Fax: [phone]   RxID:   [id number]    Orders Added: 1)  T-Hgb A1C (in-house) [17001VC] 2)  T- Capillary Blood Glucose [82948] 3)  T-Urine Microalbumin w/creat. ratio [[id number]] 4)  T-Lipid Profile [[id number]] 5)  T-CMP with Estimated GFR [[id number]] 6)  Diabetic Clinic Referral [Diabetic] 7)  Est. Patient Level IV [94496]   Process Orders Tests Sent for requisitioning (January 31, 2010 3:17 PM):     01/31/2010: [facility] -- T-Urine Microalbumin w/creat. ratio [[id number]] (signed)     01/31/2010: [facility] -- T-Lipid Profile [phone] (signed)     01/31/2010: [facility] -- T-CMP with Estimated GFR [[id number]] (signed)   Prevention & Chronic Care Immunizations   Influenza vaccine: Not documented   Influenza vaccine deferral: Refused  (01/31/2010)   Influenza vaccine due: 11/23/2008    Tetanus booster: Not documented   Td booster deferral: Deferred  (03/03/2009)    Pneumococcal vaccine: Not documented  Other Screening   Smoking status: current  (01/31/2010)   Smoking cessation counseling: yes  (01/31/2010)  Diabetes Mellitus   HgbA1C: 9.6  (01/31/2010)    Eye exam: Not documented    Foot exam: yes  (01/31/2010)   Foot exam action/deferral: Do today   High risk foot: No  (01/31/2010)   Foot care education: Done  (01/31/2010)   Foot exam due: 01/30/2011    Urine microalbumin/creatinine ratio: 4.7  (08/01/2008)   Urine microalbumin action/deferral: Ordered    Diabetes flowsheet reviewed?: Yes   Progress toward A1C goal: Unchanged  Lipids   Total Cholesterol: 251  (08/01/2008)   Lipid panel action/deferral: Lipid Panel ordered   LDL: 132  (08/01/2008)   LDL Direct: Not documented   HDL: 78  (08/01/2008)   Triglycerides: 206  (08/01/2008)    SGOT (AST): 33  (08/01/2008)   SGPT (ALT): 46  (08/01/2008)   Alkaline phosphatase: 192  (08/01/2008)   Total bilirubin: 0.4  (08/01/2008)    Lipid flowsheet reviewed?: Yes   Progress toward LDL goal: Unchanged  Hypertension   Last Blood Pressure: 132 / 95  (01/31/2010)   Serum creatinine: 0.87  (08/01/2008)   Serum potassium 4.4  (08/01/2008)    Hypertension flowsheet reviewed?: Yes   Progress toward BP goal: Deteriorated  Self-Management Support :   Personal Goals (by the next clinic visit) :     Personal A1C goal: 7  (01/31/2010)     Personal blood pressure goal: 130/80   (01/31/2010)     Personal LDL goal: 100  (01/31/2010)    Patient will work on the following items until the next clinic visit  to reach self-care goals:     Medications and monitoring: take my medicines every day, check my blood sugar  (01/31/2010)     Eating: eat more vegetables  (01/31/2010)    Diabetes self-management support: Written self-care plan, Education handout  (01/31/2010)   Diabetes care plan printed   Diabetes education handout printed   Referred for diabetes self-mgmt training.    Hypertension self-management support: Written self-care plan, Education handout  (01/31/2010)   Hypertension self-care plan printed.   Hypertension education handout printed    Lipid self-management support: Written self-care plan  (01/31/2010)   Lipid self-care plan printed.   Nursing Instructions: Diabetic foot exam today     Laboratory Results   Blood Tests   Date/Time Received: January 31, 2010 11:15 AM Date/Time Reported: Maryan Rued  January 31, 2010 11:15 AM   HGBA1C: 9.6%   (Normal Range: Non-Diabetic - 3-6%   Control Diabetic - 6-8%) CBG Random:: 242m/dL     Process Orders Tests Sent for requisitioning (January 31, 2010 3:17 PM):     01/31/2010: [facility] -- T-Urine Microalbumin w/creat. ratio [[id number]] (signed)     01/31/2010: [facility] -- T-Lipid Profile [phone] [id number](signed)     01/31/2010: [facility] -- T-CMP with Estimated GFR [[[id number]](signed)

## 2010-04-24 NOTE — Letter (Signed)
Summary: ADVANCED CMN ORDER  ADVANCED CMN ORDER   Imported By: Enedina Finner 10/30/2009 16:51:12  _____________________________________________________________________  External Attachment:    Type:   Image     Comment:   External Document

## 2010-04-24 NOTE — Letter (Signed)
Summary: MEDICAL EQUIPMENT AND ORTHOTIC AND PROSTHETIC DEVICES  MEDICAL EQUIPMENT AND ORTHOTIC AND PROSTHETIC DEVICES   Imported By: Garlan Fillers 07/04/2009 15:36:52  _____________________________________________________________________  External Attachment:    Type:   Image     Comment:   External Document

## 2010-04-26 NOTE — Letter (Signed)
Summary: ADVANCED-CMN  ADVANCED-CMN   Imported By: Enedina Finner 04/06/2010 11:28:15  _____________________________________________________________________  External Attachment:    Type:   Image     Comment:   External Document

## 2010-04-27 ENCOUNTER — Ambulatory Visit: Payer: Self-pay | Admitting: Internal Medicine

## 2010-04-27 ENCOUNTER — Ambulatory Visit: Payer: Self-pay | Admitting: Ophthalmology

## 2010-05-24 ENCOUNTER — Ambulatory Visit (INDEPENDENT_AMBULATORY_CARE_PROVIDER_SITE_OTHER): Payer: Medicaid Other | Admitting: Internal Medicine

## 2010-05-24 ENCOUNTER — Encounter: Payer: Self-pay | Admitting: Internal Medicine

## 2010-05-24 VITALS — BP 110/70 | HR 80 | Temp 97.7°F | Resp 18 | Ht 68.5 in | Wt 312.2 lb

## 2010-05-24 DIAGNOSIS — E119 Type 2 diabetes mellitus without complications: Secondary | ICD-10-CM

## 2010-05-24 DIAGNOSIS — F329 Major depressive disorder, single episode, unspecified: Secondary | ICD-10-CM

## 2010-05-24 DIAGNOSIS — M549 Dorsalgia, unspecified: Secondary | ICD-10-CM

## 2010-05-24 DIAGNOSIS — J449 Chronic obstructive pulmonary disease, unspecified: Secondary | ICD-10-CM

## 2010-05-24 MED ORDER — TRAMADOL HCL 50 MG PO TABS: 50.0000 mg | ORAL_TABLET | Freq: Four times a day (QID) | ORAL | Status: DC | PRN

## 2010-05-24 MED ORDER — ACCU-CHEK MULTICLIX LANCETS MISC: 1.0000 | Freq: Two times a day (BID) | Status: DC

## 2010-05-24 MED ORDER — ALPRAZOLAM 0.5 MG PO TABS: 0.5000 mg | ORAL_TABLET | Freq: Three times a day (TID) | ORAL | Status: DC | PRN

## 2010-05-24 MED ORDER — ACCU-CHEK AVIVA KIT: 1.0000 | PACK | Status: DC

## 2010-05-24 MED ORDER — GLUCOSE BLOOD VI STRP: 1.0000 | ORAL_STRIP | Freq: Two times a day (BID) | Status: DC

## 2010-05-24 NOTE — Assessment & Plan Note (Addendum)
He has run out of xanax and wants to refill. Denies SI/HI. Will continue Prozac and refill xanax for him.

## 2010-05-24 NOTE — Assessment & Plan Note (Signed)
Now has not used his bronchodilators for several months, no SOB or wheezing. Has cut smoking a lot. Will stop advair and continue albuterol prn. Encourage to stop smoking.

## 2010-05-24 NOTE — Assessment & Plan Note (Signed)
CBG well controlled, most time <200, and current A1C 6.0. Will continue current metformin dose. He has not done eye exam yet. Will have eye referral for annual eye exam.

## 2010-05-24 NOTE — Patient Instructions (Signed)
Please try to cut your smoking and eventually quit it.

## 2010-05-24 NOTE — Assessment & Plan Note (Signed)
He still has chronic back pain and ibuprofen does not help too much. So will give him tramadol and advised him to continue weight loss.

## 2010-05-24 NOTE — Progress Notes (Signed)
  Subjective:    Patient ID: Andrew Fowler, male    DOB: 05/16/1960, 50 y.o.   MRN: [id number]  HPI Pt is a 50 yo male with PMH of DM, HTN, morbid obesity, OSA, tobacco abuse who came here for regular f/u. He has no c/o, including SOB, wheezing, CP, or fever. He has not used his bronchodilators for several months. He still has back pain and ibuprofen does not help too much. HisCBGhas been well controlled and most time less than 200. No abdominal pain or diarrhea. Now cut his smoking to 1/2 PPD from 2 PPD.       Review of Systems  Constitutional: Negative for fever, chills and fatigue.  Respiratory: Negative for apnea, cough, chest tightness, shortness of breath and wheezing.   Cardiovascular: Negative for chest pain, palpitations and leg swelling.  Gastrointestinal: Negative for nausea, abdominal pain, diarrhea, blood in stool, abdominal distention and anal bleeding.  Genitourinary: Negative for dysuria.  Musculoskeletal: Positive for back pain and arthralgias.  Skin: Negative.   Neurological: Positive for weakness. Negative for light-headedness.       Objective:   Physical Exam  Constitutional: He is oriented to person, place, and time. He appears well-developed and well-nourished.  HENT:  Head: Normocephalic and atraumatic.  Mouth/Throat: No oropharyngeal exudate.  Eyes: Conjunctivae and EOM are normal. Pupils are equal, round, and reactive to light.  Neck: Normal range of motion. Neck supple. No thyromegaly present.  Cardiovascular: Normal rate, regular rhythm, normal heart sounds and intact distal pulses.   Pulmonary/Chest: Effort normal and breath sounds normal. No respiratory distress. He has no wheezes. He exhibits no tenderness.  Abdominal: Soft. Bowel sounds are normal. He exhibits no distension. There is no tenderness. There is no rebound.  Neurological: He is alert and oriented to person, place, and time. He has normal reflexes.          Assessment & Plan:

## 2010-06-04 ENCOUNTER — Other Ambulatory Visit: Payer: Self-pay | Admitting: Internal Medicine

## 2010-06-04 DIAGNOSIS — E785 Hyperlipidemia, unspecified: Secondary | ICD-10-CM

## 2010-06-05 LAB — GLUCOSE, CAPILLARY: Glucose-Capillary: 264 mg/dL — ABNORMAL HIGH (ref 70–99)

## 2010-06-07 LAB — COMPREHENSIVE METABOLIC PANEL
Albumin: 3.3 g/dL — ABNORMAL LOW (ref 3.5–5.2)
BUN: 3 mg/dL — ABNORMAL LOW (ref 6–23)
Creatinine, Ser: 0.65 mg/dL (ref 0.4–1.5)
Potassium: 4.1 mEq/L (ref 3.5–5.1)
Total Protein: 5.9 g/dL — ABNORMAL LOW (ref 6.0–8.3)

## 2010-06-07 LAB — CBC
Hemoglobin: 16.3 g/dL (ref 13.0–17.0)
Platelets: 145 10*3/uL — ABNORMAL LOW (ref 150–400)
RBC: 5 MIL/uL (ref 4.22–5.81)
WBC: 7.9 10*3/uL (ref 4.0–10.5)

## 2010-06-07 LAB — PROTIME-INR: INR: 0.82 (ref 0.00–1.49)

## 2010-06-07 LAB — APTT: aPTT: 24 seconds (ref 24–37)

## 2010-06-07 LAB — SURGICAL PCR SCREEN
MRSA, PCR: NEGATIVE
Staphylococcus aureus: NEGATIVE

## 2010-06-24 ENCOUNTER — Inpatient Hospital Stay (HOSPITAL_COMMUNITY)
Admission: EM | Admit: 2010-06-24 | Discharge: 2010-06-26 | DRG: 641 | Disposition: A | Discharge status: Home / Self Care | Payer: Medicaid Other | Attending: Infectious Diseases | Admitting: Infectious Diseases

## 2010-06-24 ENCOUNTER — Emergency Department (HOSPITAL_COMMUNITY): Payer: Medicaid Other

## 2010-06-24 DIAGNOSIS — W19XXXA Unspecified fall, initial encounter: Secondary | ICD-10-CM

## 2010-06-24 DIAGNOSIS — M109 Gout, unspecified: Secondary | ICD-10-CM | POA: Diagnosis present

## 2010-06-24 DIAGNOSIS — G4733 Obstructive sleep apnea (adult) (pediatric): Secondary | ICD-10-CM | POA: Diagnosis present

## 2010-06-24 DIAGNOSIS — E119 Type 2 diabetes mellitus without complications: Secondary | ICD-10-CM | POA: Diagnosis present

## 2010-06-24 DIAGNOSIS — F341 Dysthymic disorder: Secondary | ICD-10-CM | POA: Diagnosis present

## 2010-06-24 DIAGNOSIS — E662 Morbid (severe) obesity with alveolar hypoventilation: Secondary | ICD-10-CM | POA: Diagnosis present

## 2010-06-24 DIAGNOSIS — Z96649 Presence of unspecified artificial hip joint: Secondary | ICD-10-CM

## 2010-06-24 DIAGNOSIS — R7989 Other specified abnormal findings of blood chemistry: Secondary | ICD-10-CM | POA: Diagnosis present

## 2010-06-24 DIAGNOSIS — M25559 Pain in unspecified hip: Secondary | ICD-10-CM

## 2010-06-24 DIAGNOSIS — E871 Hypo-osmolality and hyponatremia: Principal | ICD-10-CM | POA: Diagnosis present

## 2010-06-24 DIAGNOSIS — F1010 Alcohol abuse, uncomplicated: Secondary | ICD-10-CM | POA: Diagnosis present

## 2010-06-24 DIAGNOSIS — I10 Essential (primary) hypertension: Secondary | ICD-10-CM | POA: Diagnosis present

## 2010-06-24 DIAGNOSIS — G5600 Carpal tunnel syndrome, unspecified upper limb: Secondary | ICD-10-CM | POA: Diagnosis present

## 2010-06-24 DIAGNOSIS — W1809XA Striking against other object with subsequent fall, initial encounter: Secondary | ICD-10-CM | POA: Diagnosis present

## 2010-06-24 LAB — BASIC METABOLIC PANEL
CO2: 20 mEq/L (ref 19–32)
Calcium: 8.5 mg/dL (ref 8.4–10.5)
Chloride: 94 mEq/L — ABNORMAL LOW (ref 96–112)
GFR calc Af Amer: >60 mL/min (ref >60)
Sodium: 128 mEq/L — ABNORMAL LOW (ref 135–145)

## 2010-06-24 LAB — CBC
HCT: 42.3 % (ref 39.0–52.0)
Hemoglobin: 14.5 g/dL (ref 13.0–17.0)
RDW: 14.2 % (ref 11.5–15.5)
WBC: 13.4 10*3/uL — ABNORMAL HIGH (ref 4.0–10.5)

## 2010-06-24 LAB — DIFFERENTIAL
Basophils Absolute: 0.1 10*3/uL (ref 0.0–0.1)
Lymphocytes Relative: 21 % (ref 12–46)
Neutro Abs: 9.4 10*3/uL — ABNORMAL HIGH (ref 1.7–7.7)
Neutrophils Relative %: 70 % (ref 43–77)

## 2010-06-25 DIAGNOSIS — M25559 Pain in unspecified hip: Secondary | ICD-10-CM

## 2010-06-25 DIAGNOSIS — Z9181 History of falling: Secondary | ICD-10-CM

## 2010-06-25 LAB — COMPREHENSIVE METABOLIC PANEL
ALT: 42 U/L (ref 0–53)
AST: 38 U/L — ABNORMAL HIGH (ref 0–37)
Alkaline Phosphatase: 156 U/L — ABNORMAL HIGH (ref 39–117)
CO2: 22 mEq/L (ref 19–32)
Chloride: 104 mEq/L (ref 96–112)
GFR calc Af Amer: >60 mL/min (ref >60)
GFR calc non Af Amer: >60 mL/min (ref >60)
Potassium: 4.3 mEq/L (ref 3.5–5.1)
Sodium: 136 mEq/L (ref 135–145)
Total Bilirubin: <0.1 mg/dL — ABNORMAL LOW (ref 0.3–1.2)

## 2010-06-25 LAB — MAGNESIUM: Magnesium: 2.1 mg/dL (ref 1.5–2.5)

## 2010-06-25 LAB — URINALYSIS, ROUTINE W REFLEX MICROSCOPIC
Hgb urine dipstick: NEGATIVE
Specific Gravity, Urine: 1.009 (ref 1.005–1.030)
Urobilinogen, UA: 0.2 mg/dL (ref 0.0–1.0)

## 2010-06-25 LAB — GLUCOSE, CAPILLARY
Glucose-Capillary: 199 mg/dL — ABNORMAL HIGH (ref 70–99)
Glucose-Capillary: 130 mg/dL — ABNORMAL HIGH (ref 70–99)
Glucose-Capillary: 110 mg/dL — ABNORMAL HIGH (ref 70–99)

## 2010-06-25 LAB — PHOSPHORUS: Phosphorus: 3.6 mg/dL (ref 2.3–4.6)

## 2010-06-25 LAB — SODIUM, URINE, RANDOM: Sodium, Ur: 20 mEq/L

## 2010-06-25 LAB — CREATININE, URINE, RANDOM: Creatinine, Urine: 20.9 mg/dL

## 2010-06-25 LAB — CBC
Hemoglobin: 12.5 g/dL — ABNORMAL LOW (ref 13.0–17.0)
MCH: 30.4 pg (ref 26.0–34.0)
RBC: 4.11 MIL/uL — ABNORMAL LOW (ref 4.22–5.81)
WBC: 7.8 10*3/uL (ref 4.0–10.5)

## 2010-06-25 LAB — OSMOLALITY, URINE: Osmolality, Ur: 160 mOsm/kg — ABNORMAL LOW (ref 390–1090)

## 2010-06-25 LAB — LIPID PANEL: VLDL: 28 mg/dL (ref 0–40)

## 2010-06-25 LAB — RAPID URINE DRUG SCREEN, HOSP PERFORMED: Cocaine: NOT DETECTED

## 2010-06-26 LAB — CARDIAC PANEL(CRET KIN+CKTOT+MB+TROPI): CK, MB: 1.4 ng/mL (ref 0.3–4.0)

## 2010-06-26 LAB — BASIC METABOLIC PANEL
BUN: 9 mg/dL (ref 6–23)
CO2: 26 mEq/L (ref 19–32)
Calcium: 8.6 mg/dL (ref 8.4–10.5)
GFR calc non Af Amer: >60 mL/min (ref >60)
Glucose, Bld: 109 mg/dL — ABNORMAL HIGH (ref 70–99)
Potassium: 4 mEq/L (ref 3.5–5.1)

## 2010-07-05 LAB — CBC
HCT: 43.3 % (ref 39.0–52.0)
HCT: 46.2 % (ref 39.0–52.0)
HCT: 47.2 % (ref 39.0–52.0)
HCT: 48.6 % (ref 39.0–52.0)
HCT: 48.8 % (ref 39.0–52.0)
HCT: 49.2 % (ref 39.0–52.0)
Hemoglobin: 15.1 g/dL (ref 13.0–17.0)
Hemoglobin: 15.6 g/dL (ref 13.0–17.0)
Hemoglobin: 16.5 g/dL (ref 13.0–17.0)
Hemoglobin: 16.7 g/dL (ref 13.0–17.0)
Hemoglobin: 17 g/dL (ref 13.0–17.0)
Hemoglobin: 17 g/dL (ref 13.0–17.0)
Hemoglobin: 17.3 g/dL — ABNORMAL HIGH (ref 13.0–17.0)
MCHC: 33.9 g/dL (ref 30.0–36.0)
MCHC: 34.3 g/dL (ref 30.0–36.0)
MCHC: 34.3 g/dL (ref 30.0–36.0)
MCHC: 34.5 g/dL (ref 30.0–36.0)
MCHC: 34.5 g/dL (ref 30.0–36.0)
MCHC: 34.8 g/dL (ref 30.0–36.0)
MCV: 95.2 fL (ref 78.0–100.0)
MCV: 95.3 fL (ref 78.0–100.0)
MCV: 95.8 fL (ref 78.0–100.0)
MCV: 95.8 fL (ref 78.0–100.0)
MCV: 96.2 fL (ref 78.0–100.0)
MCV: 96.4 fL (ref 78.0–100.0)
Platelets: 189 10*3/uL (ref 150–400)
Platelets: 191 10*3/uL (ref 150–400)
Platelets: 191 10*3/uL (ref 150–400)
Platelets: 196 10*3/uL (ref 150–400)
Platelets: 205 10*3/uL (ref 150–400)
RBC: 4.49 MIL/uL (ref 4.22–5.81)
RBC: 4.55 MIL/uL (ref 4.22–5.81)
RBC: 4.66 MIL/uL (ref 4.22–5.81)
RBC: 4.68 MIL/uL (ref 4.22–5.81)
RBC: 4.89 MIL/uL (ref 4.22–5.81)
RBC: 4.99 MIL/uL (ref 4.22–5.81)
RBC: 5.12 MIL/uL (ref 4.22–5.81)
RBC: 5.14 MIL/uL (ref 4.22–5.81)
RBC: 5.18 MIL/uL (ref 4.22–5.81)
RBC: 5.25 MIL/uL (ref 4.22–5.81)
RDW: 14.3 % (ref 11.5–15.5)
RDW: 14.4 % (ref 11.5–15.5)
RDW: 14.6 % (ref 11.5–15.5)
RDW: 14.6 % (ref 11.5–15.5)
RDW: 14.7 % (ref 11.5–15.5)
RDW: 14.9 % (ref 11.5–15.5)
WBC: 10.5 10*3/uL (ref 4.0–10.5)
WBC: 11.2 10*3/uL — ABNORMAL HIGH (ref 4.0–10.5)
WBC: 11.8 10*3/uL — ABNORMAL HIGH (ref 4.0–10.5)
WBC: 12 10*3/uL — ABNORMAL HIGH (ref 4.0–10.5)
WBC: 12.3 10*3/uL — ABNORMAL HIGH (ref 4.0–10.5)
WBC: 14.5 10*3/uL — ABNORMAL HIGH (ref 4.0–10.5)
WBC: 14.5 10*3/uL — ABNORMAL HIGH (ref 4.0–10.5)
WBC: 15.1 10*3/uL — ABNORMAL HIGH (ref 4.0–10.5)

## 2010-07-05 LAB — URINE MICROSCOPIC-ADD ON

## 2010-07-05 LAB — DIFFERENTIAL
Basophils Absolute: 0 10*3/uL (ref 0.0–0.1)
Basophils Absolute: 0 10*3/uL (ref 0.0–0.1)
Basophils Absolute: 0 10*3/uL (ref 0.0–0.1)
Basophils Absolute: 0.1 10*3/uL (ref 0.0–0.1)
Basophils Absolute: 0.1 10*3/uL (ref 0.0–0.1)
Basophils Absolute: 0.4 10*3/uL — ABNORMAL HIGH (ref 0.0–0.1)
Basophils Relative: 0 % (ref 0–1)
Basophils Relative: 0 % (ref 0–1)
Basophils Relative: 0 % (ref 0–1)
Basophils Relative: 1 % (ref 0–1)
Basophils Relative: 3 % — ABNORMAL HIGH (ref 0–1)
Eosinophils Absolute: 0 10*3/uL (ref 0.0–0.7)
Eosinophils Absolute: 0 10*3/uL (ref 0.0–0.7)
Eosinophils Absolute: 0 10*3/uL (ref 0.0–0.7)
Eosinophils Absolute: 0.1 10*3/uL (ref 0.0–0.7)
Eosinophils Absolute: 0.1 10*3/uL (ref 0.0–0.7)
Eosinophils Relative: 0 % (ref 0–5)
Eosinophils Relative: 0 % (ref 0–5)
Eosinophils Relative: 0 % (ref 0–5)
Eosinophils Relative: 0 % (ref 0–5)
Eosinophils Relative: 0 % (ref 0–5)
Lymphocytes Relative: 11 % — ABNORMAL LOW (ref 12–46)
Lymphocytes Relative: 7 % — ABNORMAL LOW (ref 12–46)
Lymphs Abs: 0.9 10*3/uL (ref 0.7–4.0)
Lymphs Abs: 1 10*3/uL (ref 0.7–4.0)
Lymphs Abs: 1.3 10*3/uL (ref 0.7–4.0)
Monocytes Absolute: 0.3 10*3/uL (ref 0.1–1.0)
Monocytes Absolute: 0.6 10*3/uL (ref 0.1–1.0)
Monocytes Absolute: 0.7 10*3/uL (ref 0.1–1.0)
Monocytes Absolute: 0.7 10*3/uL (ref 0.1–1.0)
Monocytes Relative: 5 % (ref 3–12)
Monocytes Relative: 7 % (ref 3–12)
Neutro Abs: 11 10*3/uL — ABNORMAL HIGH (ref 1.7–7.7)
Neutro Abs: 11.9 10*3/uL — ABNORMAL HIGH (ref 1.7–7.7)
Neutro Abs: 11.9 10*3/uL — ABNORMAL HIGH (ref 1.7–7.7)
Neutro Abs: 9.8 10*3/uL — ABNORMAL HIGH (ref 1.7–7.7)
Neutrophils Relative %: 81 % — ABNORMAL HIGH (ref 43–77)
Neutrophils Relative %: 82 % — ABNORMAL HIGH (ref 43–77)
Neutrophils Relative %: 89 % — ABNORMAL HIGH (ref 43–77)
Neutrophils Relative %: 90 % — ABNORMAL HIGH (ref 43–77)

## 2010-07-05 LAB — COMPREHENSIVE METABOLIC PANEL
ALT: 106 U/L — ABNORMAL HIGH (ref 0–53)
ALT: 117 U/L — ABNORMAL HIGH (ref 0–53)
ALT: 506 U/L — ABNORMAL HIGH (ref 0–53)
ALT: 848 U/L — ABNORMAL HIGH (ref 0–53)
ALT: 851 U/L — ABNORMAL HIGH (ref 0–53)
ALT: 871 U/L — ABNORMAL HIGH (ref 0–53)
AST: 177 U/L — ABNORMAL HIGH (ref 0–37)
AST: 225 U/L — ABNORMAL HIGH (ref 0–37)
AST: 259 U/L — ABNORMAL HIGH (ref 0–37)
AST: 70 U/L — ABNORMAL HIGH (ref 0–37)
AST: 71 U/L — ABNORMAL HIGH (ref 0–37)
Albumin: 2.5 g/dL — ABNORMAL LOW (ref 3.5–5.2)
Albumin: 2.6 g/dL — ABNORMAL LOW (ref 3.5–5.2)
Albumin: 2.7 g/dL — ABNORMAL LOW (ref 3.5–5.2)
Albumin: 2.9 g/dL — ABNORMAL LOW (ref 3.5–5.2)
Alkaline Phosphatase: 162 U/L — ABNORMAL HIGH (ref 39–117)
Alkaline Phosphatase: 200 U/L — ABNORMAL HIGH (ref 39–117)
Alkaline Phosphatase: 218 U/L — ABNORMAL HIGH (ref 39–117)
Alkaline Phosphatase: 219 U/L — ABNORMAL HIGH (ref 39–117)
Alkaline Phosphatase: 287 U/L — ABNORMAL HIGH (ref 39–117)
Alkaline Phosphatase: 337 U/L — ABNORMAL HIGH (ref 39–117)
Alkaline Phosphatase: 361 U/L — ABNORMAL HIGH (ref 39–117)
BUN: 12 mg/dL (ref 6–23)
BUN: 14 mg/dL (ref 6–23)
BUN: 15 mg/dL (ref 6–23)
BUN: 15 mg/dL (ref 6–23)
BUN: 16 mg/dL (ref 6–23)
CO2: 28 mEq/L (ref 19–32)
CO2: 29 mEq/L (ref 19–32)
CO2: 30 mEq/L (ref 19–32)
CO2: 31 mEq/L (ref 19–32)
CO2: 31 mEq/L (ref 19–32)
CO2: 32 mEq/L (ref 19–32)
CO2: 34 mEq/L — ABNORMAL HIGH (ref 19–32)
CO2: 35 mEq/L — ABNORMAL HIGH (ref 19–32)
Calcium: 8.5 mg/dL (ref 8.4–10.5)
Calcium: 8.9 mg/dL (ref 8.4–10.5)
Calcium: 9.4 mg/dL (ref 8.4–10.5)
Chloride: 100 mEq/L (ref 96–112)
Chloride: 102 mEq/L (ref 96–112)
Chloride: 103 mEq/L (ref 96–112)
Chloride: 93 mEq/L — ABNORMAL LOW (ref 96–112)
Chloride: 94 mEq/L — ABNORMAL LOW (ref 96–112)
Chloride: 97 mEq/L (ref 96–112)
Chloride: 99 mEq/L (ref 96–112)
Creatinine, Ser: 0.78 mg/dL (ref 0.4–1.5)
Creatinine, Ser: 0.94 mg/dL (ref 0.4–1.5)
Creatinine, Ser: 1.02 mg/dL (ref 0.4–1.5)
GFR calc Af Amer: >60 mL/min (ref >60)
GFR calc Af Amer: >60 mL/min (ref >60)
GFR calc Af Amer: >60 mL/min (ref >60)
GFR calc Af Amer: >60 mL/min (ref >60)
GFR calc Af Amer: >60 mL/min (ref >60)
GFR calc non Af Amer: >60 mL/min (ref >60)
GFR calc non Af Amer: >60 mL/min (ref >60)
GFR calc non Af Amer: >60 mL/min (ref >60)
GFR calc non Af Amer: >60 mL/min (ref >60)
GFR calc non Af Amer: >60 mL/min (ref >60)
GFR calc non Af Amer: >60 mL/min (ref >60)
Glucose, Bld: 110 mg/dL — ABNORMAL HIGH (ref 70–99)
Glucose, Bld: 115 mg/dL — ABNORMAL HIGH (ref 70–99)
Glucose, Bld: 124 mg/dL — ABNORMAL HIGH (ref 70–99)
Glucose, Bld: 130 mg/dL — ABNORMAL HIGH (ref 70–99)
Glucose, Bld: 169 mg/dL — ABNORMAL HIGH (ref 70–99)
Potassium: 3.5 mEq/L (ref 3.5–5.1)
Potassium: 3.6 mEq/L (ref 3.5–5.1)
Potassium: 3.8 mEq/L (ref 3.5–5.1)
Potassium: 3.9 mEq/L (ref 3.5–5.1)
Potassium: 4 mEq/L (ref 3.5–5.1)
Potassium: 4.8 mEq/L (ref 3.5–5.1)
Potassium: 5.2 mEq/L — ABNORMAL HIGH (ref 3.5–5.1)
Sodium: 136 mEq/L (ref 135–145)
Sodium: 136 mEq/L (ref 135–145)
Sodium: 136 mEq/L (ref 135–145)
Sodium: 137 mEq/L (ref 135–145)
Sodium: 138 mEq/L (ref 135–145)
Sodium: 140 mEq/L (ref 135–145)
Total Bilirubin: 0.7 mg/dL (ref 0.3–1.2)
Total Bilirubin: 0.9 mg/dL (ref 0.3–1.2)
Total Bilirubin: 1 mg/dL (ref 0.3–1.2)
Total Bilirubin: 1 mg/dL (ref 0.3–1.2)
Total Bilirubin: 1.5 mg/dL — ABNORMAL HIGH (ref 0.3–1.2)
Total Bilirubin: 1.7 mg/dL — ABNORMAL HIGH (ref 0.3–1.2)
Total Protein: 6.1 g/dL (ref 6.0–8.3)
Total Protein: 7 g/dL (ref 6.0–8.3)
Total Protein: 7.1 g/dL (ref 6.0–8.3)

## 2010-07-05 LAB — LIPID PANEL
HDL: 61 mg/dL (ref >39)
LDL Cholesterol: 111 mg/dL — ABNORMAL HIGH (ref 0–99)
Triglycerides: 77 mg/dL (ref <150)
VLDL: 15 mg/dL (ref 0–40)

## 2010-07-05 LAB — BASIC METABOLIC PANEL
BUN: 13 mg/dL (ref 6–23)
CO2: 27 mEq/L (ref 19–32)
CO2: 28 mEq/L (ref 19–32)
Chloride: 96 mEq/L (ref 96–112)
Chloride: 98 mEq/L (ref 96–112)
GFR calc Af Amer: >60 mL/min (ref >60)
GFR calc non Af Amer: >60 mL/min (ref >60)
Glucose, Bld: 144 mg/dL — ABNORMAL HIGH (ref 70–99)
Potassium: 3.9 mEq/L (ref 3.5–5.1)
Potassium: 4.4 mEq/L (ref 3.5–5.1)
Sodium: 134 mEq/L — ABNORMAL LOW (ref 135–145)
Sodium: 135 mEq/L (ref 135–145)

## 2010-07-05 LAB — URINALYSIS, ROUTINE W REFLEX MICROSCOPIC
Glucose, UA: NEGATIVE mg/dL
Leukocytes, UA: NEGATIVE
Nitrite: NEGATIVE
Specific Gravity, Urine: 1.013 (ref 1.005–1.030)
pH: 7 (ref 5.0–8.0)

## 2010-07-05 LAB — BLOOD GAS, ARTERIAL
Bicarbonate: 28.8 mEq/L — ABNORMAL HIGH (ref 20.0–24.0)
O2 Content: 3 L/min
Patient temperature: 98.6
TCO2: 30.5 mmol/L (ref 0–100)
pCO2 arterial: 53.6 mmHg — ABNORMAL HIGH (ref 35.0–45.0)
pH, Arterial: 7.35 (ref 7.350–7.450)
pO2, Arterial: 70.3 mmHg — ABNORMAL LOW (ref 80.0–100.0)

## 2010-07-05 LAB — ALPHA-1-ANTITRYPSIN: A-1 Antitrypsin, Ser: 126 mg/dL (ref 83–200)

## 2010-07-05 LAB — CARDIAC PANEL(CRET KIN+CKTOT+MB+TROPI)
CK, MB: 2.4 ng/mL (ref 0.3–4.0)
CK, MB: 3.2 ng/mL (ref 0.3–4.0)
Relative Index: 1.4 (ref 0.0–2.5)
Total CK: 132 U/L (ref 7–232)
Troponin I: 0.01 ng/mL (ref 0.00–0.06)

## 2010-07-05 LAB — VANCOMYCIN, TROUGH: Vancomycin Tr: 22.5 ug/mL — ABNORMAL HIGH (ref 10.0–20.0)

## 2010-07-05 LAB — HEPATITIS PANEL, ACUTE: Hepatitis B Surface Ag: NEGATIVE

## 2010-07-05 LAB — LEGIONELLA ANTIGEN, URINE: Legionella Antigen, Urine: NEGATIVE

## 2010-07-05 LAB — CK TOTAL AND CKMB (NOT AT ARMC)
CK, MB: 1.8 ng/mL (ref 0.3–4.0)
Relative Index: 1.5 (ref 0.0–2.5)
Total CK: 105 U/L (ref 7–232)
Total CK: 115 U/L (ref 7–232)

## 2010-07-05 LAB — HEPATIC FUNCTION PANEL
ALT: 74 U/L — ABNORMAL HIGH (ref 0–53)
Albumin: 3.5 g/dL (ref 3.5–5.2)
Alkaline Phosphatase: 246 U/L — ABNORMAL HIGH (ref 39–117)
Indirect Bilirubin: 0.7 mg/dL (ref 0.3–0.9)
Total Protein: 7.3 g/dL (ref 6.0–8.3)

## 2010-07-05 LAB — RAPID URINE DRUG SCREEN, HOSP PERFORMED
Barbiturates: NOT DETECTED
Benzodiazepines: NOT DETECTED

## 2010-07-05 LAB — CULTURE, RESPIRATORY W GRAM STAIN

## 2010-07-05 LAB — EXPECTORATED SPUTUM ASSESSMENT W GRAM STAIN, RFLX TO RESP C

## 2010-07-05 LAB — CULTURE, BLOOD (ROUTINE X 2): Culture: NO GROWTH

## 2010-07-05 LAB — TROPONIN I: Troponin I: <0.01 ng/mL (ref 0.00–0.06)

## 2010-07-05 LAB — HIV ANTIBODY (ROUTINE TESTING W REFLEX): HIV: NONREACTIVE

## 2010-07-05 LAB — RHEUMATOID FACTOR: Rhuematoid fact SerPl-aCnc: <20 IU/mL (ref 0–20)

## 2010-07-05 LAB — CERULOPLASMIN: Ceruloplasmin: 33 mg/dL (ref 21–63)

## 2010-07-09 NOTE — Discharge Summary (Signed)
Andrew Fowler, VANDENBRINK               ACCOUNT NO.:  [id number]  MEDICAL RECORD NO.:  [id number]           PATIENT TYPE:  I  LOCATION:  5400                         FACILITY:  [facility]  PHYSICIAN:  Alison Murray, M.D.  DATE OF BIRTH:  05-10-1960  DATE OF ADMISSION:  06/24/2010 DATE OF DISCHARGE:  06/26/2010                              DISCHARGE SUMMARY   PRIMARY CARE PHYSICIAN:  [name], MD  DISCHARGE DIAGNOSES: 1. Hyponatremia secondary to dehydration and excessive beer intake     resolved at the time of discharge. 2. Alcohol abuse.  The patient says he drinks about 18 14 ounces beer     a day at this admission, has been drinking since the age of 73. 3. Chronic obstructive pulmonary disease or asthma exacerbation.  The     patient does not have any pulmonary function test on file. 4. Elevated liver enzymes secondary to alcohol abuse, negative workup     including abdominal ultrasound, hepatitis serology, ANA, rheumatoid     factor, ceruloplasmin, CMV serologies, alpha-1 antitrypsin and AMA     in the past. 5. Hypertension. 6. Depression and anxiety. 7. Morbid obesity.  The patient's weight at the time of this admission     was 147.8 kg. 8. Osteoarthritis status post total left hip replacement. 9. Gout. 10.Carpal tunnel syndrome. 11.Obstructive sleep apnea/obesity hypoventilation syndrome.  The     patient is using CPAP at home although it is unsure how compliant     he is with it. 12.Diabetes mellitus.  The patient's last HbA1c in March was 6 been     treated with oral hypoglycemic agents.  DISCHARGE MEDICATIONS: 1. Advair 1 puff twice a day. 2. Protonix 40 mg 1 tablet by mouth twice a day. 3. Fluoxetine 20 mg 1 tablet by mouth daily. 4. Ibuprofen 200 mg 4 tablets by mouth twice a day as needed for pain. 5. Lisinopril 10 mg 1 tablet by mouth daily. 6. Metformin 500 mg 1 tablet by mouth twice a day. 7. Simvastatin 40 mg 1 tablet by mouth every evening. 8. Xanax 0.5  mg 1 tablet by mouth daily as needed for anxiety.  DISPOSITION AND FOLLOWUP:  The patient will be followed up by [facility], an appointment has not been set up at the time of discharge but we will call the patient with an appointment within next 2 weeks of discharge.  At that time, follow up on his basic metabolic panel to check for persistent hyponatremia and any hypokalemia.  Also, the patient was counseled extensively about alcohol abuse.  This will need to be reinforced during outpatient clinic visit.  No other acute issues needs to be followed up for this hospitalization during the Outpatient Clinic.  Please continue his regular chronic medical problem management as before in the clinic.  ADMISSION HISTORY AND PHYSICAL:  A 50 year old gentleman with past medical history as dictated below presents to the emergency room for fall.  He reports drinking 18 beer in 4 hours duration with his brother on the day of admission and when he got up from his wheelchair his left leg  gave out and he fell on his left side and hit his face on the table. He is by the way wheelchair-bound 90% of the time during the day.  He denied any loss of consciousness, shortness of breath, chest pain, dizziness, diarrhea, cough, numbness or tingling in his left leg.  He reports bilateral leg pain from his ankle up to his groin area which he could not bear weight, left greater than right for past several months and was last seen by Dr. Sharol Given, orthopedist in October 2011.  Per the patient, Dr. Sharol Given does not think that his right hip pain is bad enough to require total hip replacement as of yet.  He denied any trauma/laceration to his left leg or thigh.  In the emergency room, blood pressure was found to be 64/42, tachycardia at rate of 103.  The patient received 3 liters of normal saline and his blood pressure trended up to 130/90 and stayed stable since then.  He remained asymptomatic after his  presentation.  PHYSICAL EXAMINATION:  VITAL SIGNS:  At admission temperature 97, pulse 103, blood pressure 130/90 when we saw the patient, respiration 18, oxygen saturation 97% on room air. GENERAL:  Alert, well developed no acute distress. NECK:  Supple.  No JVD. LUNGS:  Distant breath sounds bilaterally with mild expiratory wheezes bilaterally.  Normal respiratory effort.  No crackles. HEART:  Tachycardia regular rate and rhythm.  No murmurs. ABDOMEN:  Soft, obese, nontender, nondistended.  Normal bowel sounds. NEUROLOGICAL:  Cranial nerves II-XII intact.  Strength normal bilaterally.  Sensation is grossly intact.  Reflexes normal bilaterally. The patient was alert to time, place and person.  LABORATORY DATA:  At admission, his white count was 13.4, hemoglobin 14.5.  Sodium 128, potassium 4.2, chloride 94, bicarb 20, BUN 13, creatinine 0.98, anion gap of 14.  There was no evidence of fractures on his pelvic x-ray after his fall although it did show advanced right hip osteoarthritis and no evidence of pneumonia on his chest x-ray.  HOSPITAL COURSE: 1. Hyponatremia this was secondary to dehydration and excessive beer    intake.  We hydrated the patient aggressively during this     hospitalization and his labs trended back to normal and remained so     since then.  He tolerated regular diet prior to discharge.  We     checked his magnesium, phosphorus and other electrolytes and they     were normal prior to discharge.  He was not orthostatic at the time     of discharge.  We have encouraged him to maintain a good oral     rehydration at the time of discharge. 2. Alcohol abuse.  The patient was kept on CIWA observation during     this hospital course and he did not show any signs of withdrawal     during this hospitalization.  He was counseled extensively by the     physicians and social worker against alcohol abuse during this     hospitalization.  This will be re-enforced in his  outpatient clinic     visits. 3. Obstructive sleep apnea.  The patient was prescribed CPAP but he     refused to use it.  He also says he does not use it at home very     frequently. 4. All his other medical problems have been stable and no changes have     been made to his management.  DISCHARGE VITALS:  Temperature 98.6, pulse 93, respiration 18, blood  pressure 132/80, oxygen saturation 95% on room air.  DISCHARGE LABORATORIES:  Sodium 139, potassium 4, chloride 108, bicarb 26, BUN 9, creatinine 0.84, glucose 109, calcium 8.6.  Liver function tests show AST of 38, ALT of 42, alk phos of 156, albumin of 2.9, it seems to be at his baseline.  His lipid profile shows a cholesterol of 146, HDL of 59, LDL of 59, triglyceride of 138.  The patient has an appointment with outpatient clinic on July 11, 2010, at 1:15 p.m. with his primary care physician.     Lester [place], MD   ______________________________ Alison Murray, M.D.    MD/MEDQ  D:  06/28/2010  T:  06/28/2010  Job:  [id number]  Electronically Signed by Lester Riley  on 07/03/2010 12:21:47 PM Electronically Signed by Lars Mage M.D. on 07/09/2010 12:39:03 PM

## 2010-07-11 ENCOUNTER — Encounter: Payer: Medicaid Other | Admitting: Internal Medicine

## 2010-07-24 ENCOUNTER — Other Ambulatory Visit: Payer: Self-pay | Admitting: *Deleted

## 2010-07-24 DIAGNOSIS — J449 Chronic obstructive pulmonary disease, unspecified: Secondary | ICD-10-CM

## 2010-07-24 DIAGNOSIS — M549 Dorsalgia, unspecified: Secondary | ICD-10-CM

## 2010-07-24 DIAGNOSIS — E119 Type 2 diabetes mellitus without complications: Secondary | ICD-10-CM

## 2010-07-24 DIAGNOSIS — F329 Major depressive disorder, single episode, unspecified: Secondary | ICD-10-CM

## 2010-07-24 MED ORDER — OMEPRAZOLE 20 MG PO TBEC: 1.0000 | DELAYED_RELEASE_TABLET | Freq: Every day | ORAL | Status: DC

## 2010-07-24 MED ORDER — LISINOPRIL 10 MG PO TABS: 10.0000 mg | ORAL_TABLET | Freq: Every day | ORAL | Status: DC

## 2010-07-24 MED ORDER — ALPRAZOLAM 0.5 MG PO TABS: 0.5000 mg | ORAL_TABLET | Freq: Three times a day (TID) | ORAL | Status: DC | PRN

## 2010-07-25 NOTE — Telephone Encounter (Signed)
Rx faxed in.

## 2010-08-07 ENCOUNTER — Other Ambulatory Visit: Payer: Self-pay | Admitting: Internal Medicine

## 2010-08-07 NOTE — Procedures (Signed)
Andrew Fowler, Andrew Fowler               ACCOUNT NO.:  [id number]   MEDICAL RECORD NO.:  [id number]          PATIENT TYPE:  OUT   LOCATION:  SLEEP CENTER                 FACILITY:  [facility]   PHYSICIAN:  Clinton D. Annamaria Boots, MD, FCCP, FACPDATE OF BIRTH:  1960/05/24   DATE OF STUDY:  08/21/2008                            NOCTURNAL POLYSOMNOGRAM   REFERRING PHYSICIAN:  Lajean Saver, MD   REFERRING PHYSICIAN:  Lajean Saver, MD   INDICATION FOR STUDY:  Hypersomnia with sleep apnea.   EPWORTH SLEEPINESS SCORE:  6/24, BMI 48.5, weight 348 pounds, height 71  inches, neck 22 inches.   MEDICATIONS:  Home medications are charted and reviewed.   SLEEP ARCHITECTURE:  Split study protocol.  During the diagnostic phase,  total sleep time 143 minutes with sleep efficiency 51.7%.  Stage I was  22%, stage II 61.9%, stage III 6.3%, REM 9.8% of total sleep time.  Sleep latency 81 minutes, REM latency 181 minutes, awake after sleep  onset 52 minutes, arousal index 19.3.  No bedtime medication was taken.   RESPIRATORY DATA:  Split study protocol.  Apnea-hypopnea index (AHI)  16.4 per hour.  A total of 39 events was scored including 19 obstructive  apneas, 1 central apnea, and 19 hypopneas.  All events were recorded as  supine.  REM AHI 107.1.  CPAP was then titrated to 17 CWP, AHI 0.9 per  hour.  He wore a large Respironics FitLife mask with heated humidifier.   OXYGEN DATA:  Moderately loud snoring before CPAP with oxygen  desaturation to a nadir of 66%.  After CPAP control, mean oxygen  saturation held 94.2% on room air.   CARDIAC DATA:  Normal sinus rhythm.   MOVEMENT-PARASOMNIA:  No significant movement disturbance.  Bathroom x2.   IMPRESSIONS-RECOMMENDATIONS:  1. Moderate obstructive sleep apnea/hypopnea syndrome, apnea-hypopnea      index 16.4 per hour with most events while sleeping supine.      Moderately loud snoring with oxygen desaturation to a nadir of 66%      on room air.  2. Successful  continuous positive airway pressure titration to 17      centimeters of water pressure, apnea-hypopnea index 0.9 per hour.      He wore a large Respironics FitLife mask with heated humidifier.      Clinton D. Annamaria Boots, MD, FCCP, FACP  Diplomate, Tax adviser of Sleep Medicine  Electronically Signed     CDY/MEDQ  D:  08/28/2008 19:11:16  T:  08/29/2008 06:47:50  Job:  [id number]

## 2010-08-07 NOTE — Discharge Summary (Signed)
Andrew Fowler, Andrew Fowler               ACCOUNT NO.:  [id number]   MEDICAL RECORD NO.:  [id number]          PATIENT TYPE:  INP   LOCATION:  9242                         FACILITY:  [facility]   PHYSICIAN:  Newt Minion, MD     DATE OF BIRTH:  12/21/1960   DATE OF ADMISSION:  11/19/2007  DATE OF DISCHARGE:  11/23/2007                               DISCHARGE SUMMARY   FINAL DIAGNOSIS:  Osteoarthritis, left hip.   PROCEDURE:  Left total hip arthroplasty.   Discharged to home in stable condition with home health physical  therapy, Tylox, and Coumadin.  Followup in the office in 2 weeks.   HISTORY OF PRESENT ILLNESS:  The patient is a 50 year old gentleman with  osteoarthritis of the left hip.  He has pain with activities of daily  living, has failed conservative care, and presents at this time for  total hip arthroplasty.  The patient's hospital course was essentially  unremarkable.  He underwent a left total hip arthroplasty on November 19, 2007, with DePuy components #13 femur, 56-mm acetabulum, 49-mm head with  -1 neck.  He received Kefzol for infection prophylaxis and was started  on Coumadin for DVT prophylaxis.  Postoperatively, the patient  progressed well with his physical therapy and was discharged to home in  stable condition on November 23, 2007, with prescriptions for Tylox and  Coumadin with followup in the office in 2 weeks.      Newt Minion, MD  Electronically Signed     MVD/MEDQ  D:  01/13/2008  T:  01/13/2008  Job:  [id number]

## 2010-08-07 NOTE — Op Note (Signed)
Andrew Fowler, Andrew Fowler               ACCOUNT NO.:  [id number]   MEDICAL RECORD NO.:  [id number]          PATIENT TYPE:  INP   LOCATION:  7001                         FACILITY:  [facility]   PHYSICIAN:  Newt Minion, MD     DATE OF BIRTH:  [birth year]   DATE OF PROCEDURE:  11/19/2007  DATE OF DISCHARGE:                               OPERATIVE REPORT   PREOPERATIVE DIAGNOSIS:  Osteoarthritis, left hip.   POSTOPERATIVE DIAGNOSIS:  Osteoarthritis, left hip.   PROCEDURE:  Left total hip arthroplasty with DePuy components, #13  Corail femur, 56-mm acetabulum, 49-mm head with a -1 mm neck.   SURGEON:  Newt Minion, MD   ANESTHESIA:  General.   ESTIMATED BLOOD LOSS:  300 mL.   ANTIBIOTICS:  2 g of Kefzol.   DRAINS:  None.   COMPLICATIONS:  None.   DISPOSITION:  To PACU in stable condition.   INDICATIONS FOR PROCEDURE:  The patient is a 50 year old gentleman with  severe osteoarthritis of his left hip.  He has extension of 0 degrees,  flexion of 30 degrees, internal and external rotation of 0 degrees.  He  has failed conservative care.  He has pain with activities of daily  living and presents at this time for total hip arthroplasty.  Of note,  the patient is morbidly obese with a BMI greater than 40 with a body  weight over 350 pounds.  Risks and benefits of surgery were discussed  including infection, neurovascular injury, persistent pain, and need for  additional surgery.  The patient states he understands and wish to  proceed at this time.  The patient required extra time to perform the  procedure secondary to the morbid obesity.   DESCRIPTION OF PROCEDURE:  The patient was brought to OR 1 and underwent  a general anesthetic.  After adequate level of anesthesia obtained, the  patient was placed in the right lateral decubitus position with the left  side up and the left lower extremity was prepped using DuraPrep, draped  in a sterile field, and Ioban was used to cover all exposed  skin.  A  posterolateral incision was made, this was carried down to the tensor  fascia lata, which was split, a Charnley retractor was placed.  The  insertion of the gluteus maximus was released.  The piriformis, short  external rotators, and capsules were incised longitudinally off the neck  and this was reflected with #1 Ethibond.  The hip was dislocated and a  femoral neck cut was made 1 cm proximal to the calcar.  Attention was  then focused on the acetabulum.  The fibrinous tissue, bone spurs, and  capsule were incised from around the acetabulum.  The capsule insertion  was relaxed medially.  The acetabulum was sequentially reamed to 55 mm  from a 56-mm acetabulum.  This was impacted at 45 degrees of abduction  and 20 degrees of flexion.  Attention was then focused on the femur.  The femur was sequentially broached to a size 13, and this was tried  with a size 13 with a minus 1 neck.  The patient had flexion to greater  than 100 degrees.  He had full abduction with internal rotation of 70  degrees and his hip was stable.  The trial components were then removed.  The wounds was irrigated with normal saline throughout the case.  The  #13 Corail stem was then placed with the 49-mm head, +1 neck.  This was  reduced again, placed through range of motion, and the hip was stable.  It was again irrigated.  The piriformis short external rotators and  capsules were reapproximated using #1 Ethibond.  The tensor fascia lata  was closed using #1 Vicryl.  Subcu was closed using 2-0 Vicryl.  The  skin was closed using approximate staples.  The wound was covered with a  Mepilex dressing.  The patient was extubated, placed supine on the bed,  and was taken to PACU in stable condition with a bed pillow between his  legs.  Plan for step-down unit observation overnight due to his severe  sleep apnea and follow up expectantly with in-patient rehab.      Newt Minion, MD  Electronically  Signed     MVD/MEDQ  D:  11/19/2007  T:  11/20/2007  Job:  [id number]

## 2010-08-07 NOTE — Discharge Summary (Signed)
NAMEMELINDA, POTTINGER               ACCOUNT NO.:  [id number]   MEDICAL RECORD NO.:  [id number]          PATIENT TYPE:  INP   LOCATION:  0973                         FACILITY:  [facility]   PHYSICIAN:  Thomes Lolling, M.D.    DATE OF BIRTH:  December 29, 1960   DATE OF ADMISSION:  06/10/2008  DATE OF DISCHARGE:  06/22/2008                               DISCHARGE SUMMARY   DISCHARGE DIAGNOSES:  1. Atypical pneumonia.  2. Respiratory distress secondary to chronic obstructive pulmonary      disease or asthma exacerbation.  3. Transaminitis.  4. Hypertension.  5. Tobacco abuse.  6. Depression and anxiety.  7. Morbid obesity.  8. Osteoarthritis.  9. Gout.  10.Carpal tunnel syndrome.  11.Status post left hip replacement.   DISCHARGE MEDICATIONS:  1. HCTZ 25 mg p.o. daily.  2. Aspirin 81 mg p.o. daily.  3. Prozac 20 mg p.o. daily.  4. Seroquel 200 mg p.o. at bedtime.  5. Amlodipine 10 mg p.o. daily.  6. Advair Diskus 100/20mg, one puff b.i.d.  7. Albuterol 108 mcg 1-2 puffs every 4-6 hours p.r.n. for shortness of      breath.  8. Avelox 400 mg p.o. daily for 5 days.   DISPOSITION AND FOLLOWUP:  Mr. CWadlowwas discharged from the hospital  on June 22, 2008, in stable and improved condition.  The patient's  pneumonia and shortness of breath has significantly improved on  discharge, he has only mild wheezing and his blood pressure has been  well controlled.  He will have appointment [facility] on July 11, 2008, at 2:30 p.m.  At that  time, we need to check if his pulmonary symptoms has resolved completely  and may have sleep study referal.  Check the CMET for his liver function  because he had transaminitis during the hospitalization without  specifically identified causes.  Check his blood pressure and may need  medication adjustment if necessary.   CONSULTATIONS:  None.   PROCEDURES PERFORMED:  1. Chest x-ray on June 10, 2008, shows no  acute disease.  2. Chest CT angiogram on June 10, 2008, shows diffuse patchy ground-      glass opacities in both lungs consistent with atypical pneumonia.      No pulmonary embolism.  Mildly-to-moderately enlarged mediastinal      and hilar lymph nodes, may be reactive.  3. Abdominal ultrasound on June 11, 2008, shows no gallstones, but      fatty infiltration of the liver.   ADMISSION HISTORY:  Mr. CZimbelmanis a 50year old Caucasian male with  past medical history of smoking abuse, morbid obesity, and hypertension  [facility] with fever,  pleuritic chest pain, and shortness of breath.  The patient has these  symptoms about 5 days prior to admission, he first had right-sided chest  pain, soon later he had productive cough, shortness of breath, sore  throat, and chills.  The sputum was brownish, moderate, no blood.  He  also has nasal congestion and rhinorrhea.  During the 2 days prior to  admission, the patient found his symptoms becoming worse, and he started  to have fever, chills, and exacerbation of shortness of breath  particularly on walking.  So, he came to the ED for further evaluation.  He denies use of any drug or recent travel or sick contact.  When he  came to the ED, his temperature went up to 101.2.   ADMISSION PHYSICAL EXAMINATION:  VITAL SIGNS:  Temperature 98.7, blood  pressure 144/78, heart rate 118, respiratory rate 22, O2 saturation 91%  on room air.  GENERAL:  The patient is in no acute distress, morbidly obese.  ENT:  Poor dentition.  Dry mucosa.  NECK:  Supple.  No JVD or thyroid enlargement.  LUNGS:  Clear.  Poor-to-fair air movement, diffuse wheezing bilaterally,  but no crackles.  HEART:  Regular rate and rhythm.  Distant heart sounds, tachycardia.  No  murmur.  ABDOMEN:  Soft.  No tenderness or distention.  Bowel sounds positive.  EXTREMITIES:  No edema or tenderness.  NEURO:  Alert and oriented x3, cranial  nerves II through XII intact.   ADMISSION LABORATORY DATA:  White blood cells 10.5, hemoglobin 17,  platelets 165, ANC 8.4, MCV 95.2.  Sodium 135, potassium 3.9, chloride  98, bicarb 27, BUN 13, creatinine 1.08, glucose 119.  Bilirubin 1,  alkaline phosphatase 246, AST 61, ALT 74, total protein 7.3, albumin  3.5.  BNP less than 30, D-dimer 0.75.  Cyclic cardiac enzymes negative  x3.   HOSPITAL COURSE:  1. Atypical pneumonia.  The patient has symptoms of pleuritic chest      pain with fever, productive cough, sore throat, nasal congestion,      rhinorrhea, and chest CT shows typical finding of atypical      pneumonia with diffuse patchy ground-glass opacities in both lungs.      So, this is most likely atypical pneumonia. After admission, we      have treated the patient with Rocephin and azithromycin initially      and about 2 days later, we checked his chest X-Ray and found the      worsening of pulmonary patchy area.  So, we have added vancomycin      and Tamiflu to cover more broader bacteria and viral infection.      The patient  was also given Solu-Medrol for his pulmonary wheezing.      During the hospitalization, his blood culture is negative, and      urine Legionella antigen is negative.  Sputum culture did not found      any specific bacteria, but induced sputum only shows few Candida      albicans. Following these treatment, the patient's symptoms has      been gradually improved after treatment of antibiotics, total of      about 10 days IV then two days of oral avelox. On discharge the      patient has been afebrile, and his shortness of breath has      significantly improved, and the patient will continue to take 5      more days of oral Avelox after discharge, and we will follow up his      pulmonary symptoms at [facility] within 3 weeks.  2. Respiratory distress likely due to COPD or asthma exacerbation.      When the patient came to the hospital, he had severe  wheezing, so      we have treated this patient with Solu-Medrol 60 mg q.6 h.  with      bronchodilators of albuterol and Atrovent besides antibiotics      treatment. After 5 days of Solu-Medrol treatment, the patient's      wheeze has been gradually increased.  So, we tapered his Solu-      Medrol, and then switched to p.o. prednisone, and on discharge, we      have tapered off his prednisone, but continued to use      bronchodilators. On discharge, his wheezing has significantly      improved, and almost back to his baseline, the patient will      continue to use Advair and albuterol after discharge, also asked      the patient to quit the smoking and he would like to quit.  3. Transaminitis.  On admission, the patient had mildly increased ALT      and AST and increased alkaline phosphatase. We checked ultrasound,      which showed only fatty liver, hepatitis panel negative. Later on      the patient's liver function has been gradually increased, and the      highest alkaline phosphatase was 355, AST 259, and ALT 871.  We      felt that the increased liver function may be due to his medication      side effect, or viral infection such as CMV or Wilson disease,      autoimmune hepatitis or vasculitis associated hepatitis.  So, we      checked his ANA, rheumatoid factor, ceruloplasmin, CMV IgM, alpha      antitrypsin, and AMA, which were all within normal limits.  So, we      are not clear about the cause of his increased liver function, but      it may be due to the viral infections related to his atypical      pneumonia or the medication-induced liver damage inaddition to his      fatty liver. On discharge, his AST was 155, ALT was 772, bilirubin      1.7, alkaline phosphatase 355, and the patient had no symptoms      including jaundice, fever or abdominal pain, and we will follow up      his liver function at next office visit to see if his liver      function has been trending down. If  they continue to goes up, we      may have the GI referral for further evaluation.  4. Hypertension.  During the hospitalization, the patient's blood      pressure had been well controlled with HCTZ and amlodipine.  The      patient will continue to take the medications after discharge.  On      discharge, his blood pressure is 116/87.  5. Leukocytosis.  During the hospitalization, his white blood cells      has been stable.  They ranged around 10.5-14.5.  He had been stable      during the hospitalization without fever,  so this is likely due to      the steroid use.  On discharge, his white blood cell is 13.5, and      we will follow up his CBC at next office visit.  6. Anxiety and depression.  The patient has been taking Prozac and      Seroquel during the hospitalization, and has stable mood.  No      suicide or homicide ideation.  7. Tobacco abuse.  The patient has been given nicotine patch during      the hospitalization, we also provided smoking cessation counseling.      The patient would like to quit the smoking after discharge.  So, we      will follow up for this at next office visit.   DISCHARGE VITAL SIGNS:  Temperature 98.1, blood pressure 116/87, pulse  93, respirations 18, oxygen saturation 93% on 1 L.   DISCHARGE LABORATORY DATA:  White blood cells 13.5, hemoglobin 17.3,  platelets 192.  Sodium 134, potassium 3.2, chloride 93, bicarbonate 31,  BUN 16, creatinine 1.02, glucose 150.  Bilirubin 1.7, alkaline  phosphatase 355, AST 155, ALT 772.      [name], MD  Electronically Signed      Thomes Lolling, M.D.  Electronically Signed    ZY/MEDQ  D:  06/23/2008  T:  06/24/2008  Job:  [id number]   cc:   Outpatient Clinic

## 2010-10-03 ENCOUNTER — Other Ambulatory Visit: Payer: Self-pay | Admitting: *Deleted

## 2010-10-03 DIAGNOSIS — M549 Dorsalgia, unspecified: Secondary | ICD-10-CM

## 2010-10-03 DIAGNOSIS — J449 Chronic obstructive pulmonary disease, unspecified: Secondary | ICD-10-CM

## 2010-10-03 DIAGNOSIS — F329 Major depressive disorder, single episode, unspecified: Secondary | ICD-10-CM

## 2010-10-03 DIAGNOSIS — E119 Type 2 diabetes mellitus without complications: Secondary | ICD-10-CM

## 2010-10-03 MED ORDER — ALPRAZOLAM 0.5 MG PO TABS: 0.5000 mg | ORAL_TABLET | Freq: Three times a day (TID) | ORAL | Status: DC | PRN

## 2010-10-03 NOTE — Telephone Encounter (Signed)
[facility]anax called to [facility]; pt has an appt 10/10/10.

## 2010-10-03 NOTE — Telephone Encounter (Signed)
Ok to refill X 1 month but needs appointment prior to any additional refills or requests.Marland Kitchen

## 2010-10-10 ENCOUNTER — Ambulatory Visit (INDEPENDENT_AMBULATORY_CARE_PROVIDER_SITE_OTHER): Payer: Medicaid Other | Admitting: Internal Medicine

## 2010-10-10 ENCOUNTER — Encounter: Payer: Self-pay | Admitting: Internal Medicine

## 2010-10-10 VITALS — BP 132/90 | HR 91 | Temp 97.9°F | Ht 68.5 in | Wt 320.4 lb

## 2010-10-10 DIAGNOSIS — I10 Essential (primary) hypertension: Secondary | ICD-10-CM

## 2010-10-10 DIAGNOSIS — K429 Umbilical hernia without obstruction or gangrene: Secondary | ICD-10-CM

## 2010-10-10 DIAGNOSIS — E119 Type 2 diabetes mellitus without complications: Secondary | ICD-10-CM

## 2010-10-10 LAB — POCT GLYCOSYLATED HEMOGLOBIN (HGB A1C): Hemoglobin A1C: 7.2

## 2010-10-10 LAB — GLUCOSE, CAPILLARY: Glucose-Capillary: 135 mg/dL — ABNORMAL HIGH (ref 70–99)

## 2010-10-10 NOTE — Assessment & Plan Note (Signed)
Patient HTN is well controlled. His Bp is 132/90 mmHg. Will continue current regimen.

## 2010-10-10 NOTE — Assessment & Plan Note (Signed)
Patient's A1c is 7.2 today. Will continue the current regimen.

## 2010-10-10 NOTE — Assessment & Plan Note (Signed)
Patient's hernia is completely reducible. Referral to general surgeon.

## 2010-10-10 NOTE — Progress Notes (Signed)
  Subjective:    Patient ID: Andrew Fowler, male    DOB: 1960-06-27, 50 y.o.   MRN: [id number]  HPI Patient is 50 YO male with PMH of hypertension, DM-II, depression, COPD, morbid obesity and s/p of left hip replacement presents with abdominal bulge and pain. Patient first noticed the abdominal bulge around his umbilicus area one and half years ago. It was fist-like in size at the beginning. It is gradually enlarging. Now it is football-like in size. It is completely reducible at rest and appears when he does exertion or bears down.  Since three months ago, he starts having abdominal pain. The pain is localized at the middle upper and left lower quadrants, 8/10 in severity, sharp. Pain always comes on when bulge appears and goes away when bulge disappears. Patient denies nausea, vomiting, diarrhea, fever and chills.    Review of Systems  Constitutional: Negative for fever, chills, diaphoresis, activity change, appetite change, fatigue and unexpected weight change.  HENT: Negative for hearing loss, ear pain, nosebleeds, congestion, sore throat, facial swelling, rhinorrhea, sneezing, drooling, mouth sores, neck pain, neck stiffness, dental problem, voice change, postnasal drip, sinus pressure, tinnitus and ear discharge.   Eyes: Negative.   Respiratory: Negative for apnea, cough, choking, chest tightness, shortness of breath, wheezing and stridor.   Cardiovascular: Negative for chest pain, palpitations and leg swelling.  Gastrointestinal: Positive for abdominal pain. Negative for nausea, vomiting, diarrhea, constipation, blood in stool, abdominal distention, anal bleeding and rectal pain.  Genitourinary: Negative.   Musculoskeletal: Negative for myalgias, back pain, joint swelling, arthralgias and gait problem.  Skin: Negative for color change, pallor and rash.  Neurological: Negative.   Hematological: Negative for adenopathy. Does not bruise/bleed easily.  Psychiatric/Behavioral: Negative for  suicidal ideas, hallucinations, behavioral problems, confusion, sleep disturbance, self-injury, dysphoric mood, decreased concentration and agitation. The patient is not nervous/anxious and is not hyperactive.        Objective:   Physical Exam    General: alert, well-developed, and cooperative to examination.  Head: normocephalic and atraumatic.  Eyes: vision grossly intact, pupils equal, pupils round, pupils reactive to light, no injection and anicteric.  Mouth: pharynx pink and moist, no erythema, and no exudates.  Neck: supple, full ROM, no thyromegaly, no JVD, and no carotid bruits.  Lungs: normal respiratory effort, no accessory muscle use, normal breath sounds, no crackles, and no wheezes. Heart: normal rate, regular rhythm, no murmur, no gallop, and no rub.  Abdomen: soft, normal bowel sounds,  no guarding, no hepatomegaly, and no splenomegaly. A football-sized bulge around umbilicus area can be seen when patient bears down at supine position. It is completely reducible. Tenderness over middle upper and left lower quadrants. No rebound pain.   Pulses: 2+ DP/PT pulses bilaterally Extremities: No cyanosis, clubbing, edema Neurologic: alert, strength normal in all extremities, sensation intact to light touch, and gait normal.  Skin: turgor normal and no rashes.  Psych: Oriented X3, memory intact for recent and remote, normally interactive, good eye contact, not anxious appearing, and not depressed appearing.      Assessment & Plan:

## 2010-10-11 NOTE — Progress Notes (Signed)
Andrew Fowler was seen and examined. He is scheduled to see general surgery on 10/26/10. I agree with assessment and plan as per Dr. Donna Bernard. Patient knows to be very careful and to seek emergent care if he is unable to reduce hernia.

## 2010-10-12 ENCOUNTER — Encounter (INDEPENDENT_AMBULATORY_CARE_PROVIDER_SITE_OTHER): Payer: Self-pay | Admitting: Surgery

## 2010-10-22 ENCOUNTER — Encounter (INDEPENDENT_AMBULATORY_CARE_PROVIDER_SITE_OTHER): Payer: Self-pay | Admitting: Surgery

## 2010-10-26 ENCOUNTER — Ambulatory Visit (INDEPENDENT_AMBULATORY_CARE_PROVIDER_SITE_OTHER): Payer: Medicaid Other | Admitting: Surgery

## 2010-10-26 ENCOUNTER — Encounter (INDEPENDENT_AMBULATORY_CARE_PROVIDER_SITE_OTHER): Payer: Self-pay | Admitting: Surgery

## 2010-10-26 VITALS — BP 114/84 | HR 80 | Temp 97.4°F | Ht 71.0 in | Wt 315.2 lb

## 2010-10-26 DIAGNOSIS — K429 Umbilical hernia without obstruction or gangrene: Secondary | ICD-10-CM

## 2010-10-26 NOTE — Progress Notes (Signed)
Andrew Fowler is a 50 y.o. male.    Chief Complaint  Patient presents with  . Other    new pt- eval umb hernia.... npi in system    HPI HPI Patient presents with several months of periumbilical pain. He has also noted a large mass that protrudes in his upper abdomen when he is supine transition into a sitting position. Most of his tenderness is around his umbilicus. He denies any obstructive symptoms.  Past Medical History  Diagnosis Date  . COPD (chronic obstructive pulmonary disease)   . Hypertension   . Hyperlipidemia   . Arthritis   . Obesity   . Tobacco abuse   . Gout   . Diabetes mellitus   . Depression   . Obstructive sleep apnea   . Umbilical hernia     Past Surgical History  Procedure Date  . Back surgery 1995    lower back  . Joint replacement     left hip replacement    Family History  Problem Relation Age of Onset  . Diabetes Mother   . Heart attack Mother   . Heart disease Mother   . Diabetes Father   . Kidney disease Father     Social History History  Substance Use Topics  . Smoking status: Current Everyday Smoker -- 0.5 packs/day  . Smokeless tobacco: Not on file  . Alcohol Use: 3.6 oz/week    6 Cans of beer per week    No Known Allergies  Current Outpatient Prescriptions  Medication Sig Dispense Refill  . albuterol (VENTOLIN HFA) 108 (90 BASE) MCG/ACT inhaler Inhale 1-2 puffs into the lungs every 4 (four) hours as needed. Or every 6 hours as needed for shortness of breath       . ALPRAZolam (XANAX) 0.5 MG tablet Take 1 tablet (0.5 mg total) by mouth 3 (three) times daily as needed. For anxiety  90 tablet  0  . aspirin (BAYER LOW STRENGTH) 81 MG EC tablet Take 81 mg by mouth daily.        . Blood Glucose Monitoring Suppl (ACCU-CHEK AVIVA) kit 1 each by Other route as directed. Use as instructed  1 each  0  . FLUoxetine (PROZAC) 20 MG capsule Take 20 mg by mouth daily.        Marland Kitchen glucose blood (ACCU-CHEK AVIVA PLUS) test strip 1 each by  Other route 2 (two) times daily before a meal. Use as instructed  100 each  12  . Lancets (ACCU-CHEK MULTICLIX) lancets 1 each by Other route 2 (two) times daily before a meal. Use as instructed  100 each  12  . lisinopril (PRINIVIL,ZESTRIL) 10 MG tablet TAKE 1 TABLET BY MOUTH ONCE A DAY  90 tablet  2  . metFORMIN (GLUCOPHAGE) 500 MG tablet TAKE 1 TABLET BY MOUTH TWO TIMES A DAY  180 tablet  2  . simvastatin (ZOCOR) 40 MG tablet TAKE 1 TABLET BY MOUTH EACH EVENING  30 tablet  5  . Omeprazole ([facility] OMEPRAZOLE) 20 MG TBEC Take 1 tablet (20 mg total) by mouth daily.  30 each  5  . traMADol (ULTRAM) 50 MG tablet Take 1 tablet (50 mg total) by mouth every 6 (six) hours as needed for pain.  60 tablet  3    Review of Systems ROS Positive for obesity shortness of breath, sleep apnea, reflux symptoms, chronic hip pain requiring motorized scooter Physical Exam Physical Exam   Blood pressure 114/84, pulse 80, temperature 97.4 F (36.3 C),  height 5' 11"  (1.803 m), weight 315 lb 3.2 oz (142.974 kg). WDWN in NAD - obese, wheelchair bound HEENT:  EOMI, sclera anicteric Neck:  No masses, no thyromegaly Lungs:  CTA bilaterally; normal respiratory effort CV:  Regular rate and rhythm; no murmurs Abd:  +bowel sounds, soft, non-tender,large upper midline rectus diastasis, 2 cm reducible umbilical hernia Ext:  Well-perfused; no edema Skin:  Warm, dry; no sign of jaundice  Assessment/Plan 1.  Rectus diastasis - no treatment needed 2.  Umbilical hernia - recommend umbilical hernia repair with mesh.  The surgical procedure has been discussed with the patient.  Potential risks, benefits, alternative treatments, and expected outcomes have been explained.  All of the patient's questions at this time have been answered.  The patient understand the proposed surgical procedure and wishes to proceed  Counseled the patient regarding smoking cessation. Latoia Eyster K. 10/26/2010, 3:19 PM

## 2010-11-01 ENCOUNTER — Other Ambulatory Visit: Payer: Self-pay | Admitting: *Deleted

## 2010-11-01 ENCOUNTER — Telehealth: Payer: Self-pay | Admitting: *Deleted

## 2010-11-01 DIAGNOSIS — J449 Chronic obstructive pulmonary disease, unspecified: Secondary | ICD-10-CM

## 2010-11-01 DIAGNOSIS — M549 Dorsalgia, unspecified: Secondary | ICD-10-CM

## 2010-11-01 DIAGNOSIS — E119 Type 2 diabetes mellitus without complications: Secondary | ICD-10-CM

## 2010-11-01 DIAGNOSIS — F329 Major depressive disorder, single episode, unspecified: Secondary | ICD-10-CM

## 2010-11-01 MED ORDER — ALPRAZOLAM 0.5 MG PO TABS: 0.5000 mg | ORAL_TABLET | Freq: Three times a day (TID) | ORAL | Status: DC | PRN

## 2010-11-01 NOTE — Telephone Encounter (Signed)
[facility] rx [facility]; pt was called and made awared.

## 2010-11-01 NOTE — Telephone Encounter (Signed)
Appears to be on alprazolam chronically for anxiety but there is no anxiety on problem list or mention of anxiety in recent progress notes.  Will refill and ask that Dr. Blaine Hamper document anxiety, if present, at his follow-up visit.

## 2010-11-01 NOTE — Telephone Encounter (Signed)
Last refill 10/03/10. Last appt was 10/10/10.

## 2010-12-07 ENCOUNTER — Encounter: Payer: Self-pay | Admitting: Internal Medicine

## 2010-12-07 ENCOUNTER — Other Ambulatory Visit: Payer: Self-pay | Admitting: *Deleted

## 2010-12-07 DIAGNOSIS — E785 Hyperlipidemia, unspecified: Secondary | ICD-10-CM

## 2010-12-07 MED ORDER — SIMVASTATIN 40 MG PO TABS: 40.0000 mg | ORAL_TABLET | Freq: Every day | ORAL | Status: DC

## 2010-12-19 ENCOUNTER — Encounter: Payer: Medicaid Other | Admitting: Internal Medicine

## 2010-12-19 ENCOUNTER — Emergency Department (HOSPITAL_COMMUNITY): Payer: Medicaid Other

## 2010-12-19 ENCOUNTER — Emergency Department (HOSPITAL_COMMUNITY)
Admission: EM | Admit: 2010-12-19 | Discharge: 2010-12-19 | Disposition: A | Discharge status: Home / Self Care | Payer: Medicaid Other | Attending: Emergency Medicine | Admitting: Emergency Medicine

## 2010-12-19 DIAGNOSIS — I2510 Atherosclerotic heart disease of native coronary artery without angina pectoris: Secondary | ICD-10-CM | POA: Insufficient documentation

## 2010-12-19 DIAGNOSIS — I10 Essential (primary) hypertension: Secondary | ICD-10-CM | POA: Insufficient documentation

## 2010-12-19 DIAGNOSIS — F3289 Other specified depressive episodes: Secondary | ICD-10-CM | POA: Insufficient documentation

## 2010-12-19 DIAGNOSIS — R059 Cough, unspecified: Secondary | ICD-10-CM | POA: Insufficient documentation

## 2010-12-19 DIAGNOSIS — Z79899 Other long term (current) drug therapy: Secondary | ICD-10-CM | POA: Insufficient documentation

## 2010-12-19 DIAGNOSIS — R079 Chest pain, unspecified: Secondary | ICD-10-CM | POA: Insufficient documentation

## 2010-12-19 DIAGNOSIS — R05 Cough: Secondary | ICD-10-CM | POA: Insufficient documentation

## 2010-12-19 DIAGNOSIS — R0609 Other forms of dyspnea: Secondary | ICD-10-CM | POA: Insufficient documentation

## 2010-12-19 DIAGNOSIS — F329 Major depressive disorder, single episode, unspecified: Secondary | ICD-10-CM | POA: Insufficient documentation

## 2010-12-19 DIAGNOSIS — E119 Type 2 diabetes mellitus without complications: Secondary | ICD-10-CM | POA: Insufficient documentation

## 2010-12-19 DIAGNOSIS — Z7982 Long term (current) use of aspirin: Secondary | ICD-10-CM | POA: Insufficient documentation

## 2010-12-19 DIAGNOSIS — M25519 Pain in unspecified shoulder: Secondary | ICD-10-CM | POA: Insufficient documentation

## 2010-12-19 DIAGNOSIS — R000 Tachycardia, unspecified: Secondary | ICD-10-CM | POA: Insufficient documentation

## 2010-12-19 DIAGNOSIS — R0989 Other specified symptoms and signs involving the circulatory and respiratory systems: Secondary | ICD-10-CM | POA: Insufficient documentation

## 2010-12-19 DIAGNOSIS — R0602 Shortness of breath: Secondary | ICD-10-CM | POA: Insufficient documentation

## 2010-12-19 LAB — COMPREHENSIVE METABOLIC PANEL
AST: 58 U/L — ABNORMAL HIGH (ref 0–37)
Albumin: 3.5 g/dL (ref 3.5–5.2)
Alkaline Phosphatase: 338 U/L — ABNORMAL HIGH (ref 39–117)
Chloride: 99 mEq/L (ref 96–112)
Creatinine, Ser: 0.74 mg/dL (ref 0.50–1.35)
Potassium: 3.6 mEq/L (ref 3.5–5.1)
Total Bilirubin: 0.4 mg/dL (ref 0.3–1.2)
Total Protein: 7 g/dL (ref 6.0–8.3)

## 2010-12-19 LAB — DIFFERENTIAL
Basophils Absolute: 0.1 10*3/uL (ref 0.0–0.1)
Basophils Relative: 1 % (ref 0–1)
Monocytes Absolute: 0.8 10*3/uL (ref 0.1–1.0)
Neutro Abs: 9.8 10*3/uL — ABNORMAL HIGH (ref 1.7–7.7)
Neutrophils Relative %: 73 % (ref 43–77)

## 2010-12-19 LAB — CBC
Hemoglobin: 14.5 g/dL (ref 13.0–17.0)
MCHC: 34.4 g/dL (ref 30.0–36.0)
RBC: 4.64 MIL/uL (ref 4.22–5.81)
WBC: 13.5 10*3/uL — ABNORMAL HIGH (ref 4.0–10.5)

## 2010-12-19 LAB — TROPONIN I: Troponin I: <0.3 ng/mL (ref <0.30)

## 2010-12-19 LAB — CK TOTAL AND CKMB (NOT AT ARMC): CK, MB: 1.7 ng/mL (ref 0.3–4.0)

## 2010-12-19 MED ORDER — IOHEXOL 350 MG/ML SOLN
100.0000 mL | Freq: Once | INTRAVENOUS | Status: AC | PRN
  Administered 2010-12-19: 100 mL via INTRAVENOUS

## 2010-12-26 ENCOUNTER — Emergency Department (HOSPITAL_COMMUNITY): Payer: Medicaid Other

## 2010-12-26 ENCOUNTER — Emergency Department (HOSPITAL_COMMUNITY)
Admission: EM | Admit: 2010-12-26 | Discharge: 2010-12-26 | Disposition: A | Discharge status: Home / Self Care | Payer: Medicaid Other | Attending: Emergency Medicine | Admitting: Emergency Medicine

## 2010-12-26 DIAGNOSIS — M542 Cervicalgia: Secondary | ICD-10-CM | POA: Insufficient documentation

## 2010-12-26 DIAGNOSIS — R05 Cough: Secondary | ICD-10-CM | POA: Insufficient documentation

## 2010-12-26 DIAGNOSIS — R0989 Other specified symptoms and signs involving the circulatory and respiratory systems: Secondary | ICD-10-CM | POA: Insufficient documentation

## 2010-12-26 DIAGNOSIS — M546 Pain in thoracic spine: Secondary | ICD-10-CM | POA: Insufficient documentation

## 2010-12-26 DIAGNOSIS — R059 Cough, unspecified: Secondary | ICD-10-CM | POA: Insufficient documentation

## 2010-12-26 DIAGNOSIS — R0602 Shortness of breath: Secondary | ICD-10-CM | POA: Insufficient documentation

## 2010-12-26 DIAGNOSIS — R071 Chest pain on breathing: Secondary | ICD-10-CM | POA: Insufficient documentation

## 2010-12-26 DIAGNOSIS — E119 Type 2 diabetes mellitus without complications: Secondary | ICD-10-CM | POA: Insufficient documentation

## 2010-12-26 DIAGNOSIS — J45909 Unspecified asthma, uncomplicated: Secondary | ICD-10-CM | POA: Insufficient documentation

## 2010-12-26 DIAGNOSIS — F329 Major depressive disorder, single episode, unspecified: Secondary | ICD-10-CM | POA: Insufficient documentation

## 2010-12-26 DIAGNOSIS — Z79899 Other long term (current) drug therapy: Secondary | ICD-10-CM | POA: Insufficient documentation

## 2010-12-26 DIAGNOSIS — F3289 Other specified depressive episodes: Secondary | ICD-10-CM | POA: Insufficient documentation

## 2010-12-26 DIAGNOSIS — I10 Essential (primary) hypertension: Secondary | ICD-10-CM | POA: Insufficient documentation

## 2010-12-26 DIAGNOSIS — R0609 Other forms of dyspnea: Secondary | ICD-10-CM | POA: Insufficient documentation

## 2010-12-26 LAB — CBC
HCT: 42.1 % (ref 39.0–52.0)
MCHC: 34.7 g/dL (ref 30.0–36.0)
Platelets: 228 10*3/uL (ref 150–400)
RDW: 14.2 % (ref 11.5–15.5)
WBC: 15.1 10*3/uL — ABNORMAL HIGH (ref 4.0–10.5)

## 2010-12-26 LAB — COMPREHENSIVE METABOLIC PANEL
ALT: 44 U/L (ref 0–53)
AST: 27 U/L (ref 0–37)
Albumin: 3.6 g/dL (ref 3.5–5.2)
Alkaline Phosphatase: 321 U/L — ABNORMAL HIGH (ref 39–117)
Chloride: 90 mEq/L — ABNORMAL LOW (ref 96–112)
Potassium: 3.3 mEq/L — ABNORMAL LOW (ref 3.5–5.1)
Sodium: 130 mEq/L — ABNORMAL LOW (ref 135–145)
Total Bilirubin: 0.4 mg/dL (ref 0.3–1.2)
Total Protein: 7.4 g/dL (ref 6.0–8.3)

## 2010-12-26 LAB — DIFFERENTIAL
Basophils Absolute: 0.1 10*3/uL (ref 0.0–0.1)
Basophils Relative: 1 % (ref 0–1)
Eosinophils Relative: 2 % (ref 0–5)
Lymphocytes Relative: 15 % (ref 12–46)

## 2010-12-26 LAB — POCT I-STAT TROPONIN I: Troponin i, poc: 0.01 ng/mL (ref 0.00–0.08)

## 2010-12-31 ENCOUNTER — Telehealth: Payer: Self-pay | Admitting: Internal Medicine

## 2010-12-31 ENCOUNTER — Telehealth: Payer: Self-pay | Admitting: *Deleted

## 2010-12-31 DIAGNOSIS — M109 Gout, unspecified: Secondary | ICD-10-CM

## 2010-12-31 MED ORDER — COLCHICINE 0.6 MG PO TABS: 0.6000 mg | ORAL_TABLET | Freq: Every day | ORAL | Status: DC

## 2010-12-31 NOTE — Assessment & Plan Note (Signed)
a 

## 2010-12-31 NOTE — Telephone Encounter (Signed)
If patient develops muscle pain, please stop taking this medication and come to clinic for evaluation.

## 2010-12-31 NOTE — Telephone Encounter (Signed)
Call from pt would like to get a refill on his Colchicine.   Has not had the medication for awhile.

## 2011-01-02 NOTE — Telephone Encounter (Signed)
Unable to reach pt, message left for him to call clinic

## 2011-01-16 ENCOUNTER — Inpatient Hospital Stay (INDEPENDENT_AMBULATORY_CARE_PROVIDER_SITE_OTHER)
Admission: RE | Admit: 2011-01-16 | Discharge: 2011-01-16 | Disposition: A | Discharge status: Home / Self Care | Payer: Medicaid Other | Source: Ambulatory Visit | Attending: Family Medicine | Admitting: Family Medicine

## 2011-01-16 DIAGNOSIS — IMO0002 Reserved for concepts with insufficient information to code with codable children: Secondary | ICD-10-CM

## 2011-01-16 NOTE — Progress Notes (Signed)
Addended by: Hulan Fray on: 01/16/2011 07:34 AM   Modules accepted: Orders

## 2011-01-31 ENCOUNTER — Encounter: Payer: Self-pay | Admitting: Internal Medicine

## 2011-01-31 ENCOUNTER — Encounter: Payer: Medicaid Other | Admitting: Internal Medicine

## 2011-01-31 ENCOUNTER — Ambulatory Visit (INDEPENDENT_AMBULATORY_CARE_PROVIDER_SITE_OTHER): Payer: Medicaid Other | Admitting: Internal Medicine

## 2011-01-31 VITALS — BP 114/82 | HR 100 | Temp 98.1°F | Wt 304.9 lb

## 2011-01-31 DIAGNOSIS — L738 Other specified follicular disorders: Secondary | ICD-10-CM

## 2011-01-31 DIAGNOSIS — I10 Essential (primary) hypertension: Secondary | ICD-10-CM

## 2011-01-31 DIAGNOSIS — E785 Hyperlipidemia, unspecified: Secondary | ICD-10-CM

## 2011-01-31 DIAGNOSIS — Z23 Encounter for immunization: Secondary | ICD-10-CM

## 2011-01-31 DIAGNOSIS — F17200 Nicotine dependence, unspecified, uncomplicated: Secondary | ICD-10-CM

## 2011-01-31 DIAGNOSIS — J449 Chronic obstructive pulmonary disease, unspecified: Secondary | ICD-10-CM

## 2011-01-31 DIAGNOSIS — N8300 Follicular cyst of ovary, unspecified side: Secondary | ICD-10-CM

## 2011-01-31 DIAGNOSIS — F329 Major depressive disorder, single episode, unspecified: Secondary | ICD-10-CM

## 2011-01-31 DIAGNOSIS — E119 Type 2 diabetes mellitus without complications: Secondary | ICD-10-CM

## 2011-01-31 DIAGNOSIS — M549 Dorsalgia, unspecified: Secondary | ICD-10-CM

## 2011-01-31 LAB — POCT GLYCOSYLATED HEMOGLOBIN (HGB A1C): Hemoglobin A1C: 6.4

## 2011-01-31 LAB — GLUCOSE, CAPILLARY: Glucose-Capillary: 142 mg/dL — ABNORMAL HIGH (ref 70–99)

## 2011-01-31 MED ORDER — SIMVASTATIN 40 MG PO TABS: 40.0000 mg | ORAL_TABLET | Freq: Every day | ORAL | Status: DC

## 2011-01-31 MED ORDER — FLUOXETINE HCL 20 MG PO CAPS: 20.0000 mg | ORAL_CAPSULE | Freq: Every day | ORAL | Status: DC

## 2011-01-31 MED ORDER — METFORMIN HCL 500 MG PO TABS: 500.0000 mg | ORAL_TABLET | Freq: Two times a day (BID) | ORAL | Status: DC

## 2011-01-31 MED ORDER — LISINOPRIL 10 MG PO TABS: 10.0000 mg | ORAL_TABLET | Freq: Every day | ORAL | Status: DC

## 2011-01-31 MED ORDER — ALPRAZOLAM 0.5 MG PO TABS: 0.5000 mg | ORAL_TABLET | Freq: Three times a day (TID) | ORAL | Status: DC | PRN

## 2011-01-31 NOTE — Assessment & Plan Note (Signed)
No changes in meds

## 2011-01-31 NOTE — Patient Instructions (Signed)
1800 Calorie Diabetic Diet The 1800 calorie diabetic diet is designed for eating up to 1800 calories each day. Following this diet and making healthy meal choices can help improve overall health. It controls blood glucose (sugar) levels, and it can also help lower blood pressure and cholesterol. SERVING SIZES Measuring foods and serving sizes helps to make sure you are getting the right amount of food. The list below tells how big or small some common serving sizes are:  1 oz.........4 stacked dice.   3 oz........Marland KitchenDeck of cards.   1 tsp.......Marland KitchenTip of little finger.   1 tbs......Marland KitchenMarland KitchenThumb.   2 tbs.......Marland KitchenGolf ball.    cup......Marland KitchenHalf of a fist.   1 cup.......Marland KitchenA fist.  GUIDELINES FOR CHOOSING FOODS The goal of this diet is to eat a variety of foods and limit calories to 1800 each day. This can be done by choosing foods that are low in calories and fat. The diet also suggests eating small amounts of food frequently. Doing this helps control your blood glucose levels so they do not get too high or too low. Each meal or snack may include a protein food source to help you feel more satisfied. Try to eat about the same amount of food around the same time each day. This includes weekend days, travel days, and days off work. Space your meals about 4 to 5 hours apart, and add a snack between them, if you wish.  For example, a daily food plan could include breakfast, a morning snack, lunch, dinner, and an evening snack. Healthy meals and snacks have different types of foods, including whole grains, vegetables, fruits, lean meats, poultry, fish, and dairy products. As you plan your meals, select a variety of foods. Choose from the bread and starch, vegetable, fruit, dairy, and meat/protein groups. Examples of foods from each group are listed below with their suggested serving sizes. Use measuring cups and spoons to become familiar with what a healthy portion looks like. Bread and Starch Each serving equals  15 grams of carbohydrates.  1 slice bread.    bagel.    cup cold cereal (unsweetened).    cup hot cereal or mashed potatoes.   1 small potato (size of a computer mouse).   ? cup cooked pasta or rice.    English muffin.   1 cup broth-based soup.   3 cups of popcorn.   4 to 6 whole-wheat crackers.    cup cooked beans, peas, or corn.  Vegetables Each serving equals 5 grams of carbohydrates.   cup cooked vegetables.   1 cup raw vegetables.    cup tomato or vegetable juice.  Fruit Each serving equals 15 grams of carbohydrates.  1 small apple or orange.   1  cup watermelon or strawberries.    cup applesauce (no sugar added).   2 tbs raisins.    banana.    cup canned fruit, packed in water or in its own juice.    cup unsweetened fruit juice.  Dairy Each serving equals 12 to 15 grams of carbohydrates.  1 cup fat-free milk.   6 oz artificially sweetened yogurt or plain yogurt.   1 cup low-fat buttermilk.   1 cup soy milk.   1 cup almond milk.  Meat/Protein  1 large egg.   2 to 3 oz meat, poultry, or fish.    cup low-fat cottage cheese.   1 tbs peanut butter.   1 oz low-fat cheese.    cup tuna, packed in water.  cup tofu.  Fat  1 tsp oil.   1 tsp trans-fat-free margarine.   1 tsp butter.   1 tsp mayonnaise.   2 tbs avocado.   1 tbs salad dressing.   1 tbs cream cheese.   2 tbs sour cream.  SAMPLE 1800 CALORIE DIET PLAN Breakfast   cup unsweetened cereal (1 carb serving).   1 cup fat-free milk (1 carb serving).   1 slice whole-wheat toast (1 carb serving).    small banana (1 carb serving).   1 scrambled egg.   1 tsp trans-fat-free margarine.  Lunch  Tuna sandwich.   2 slices whole-wheat bread (2 carb servings).    cup canned tuna in water, drained.   1 tbs reduced fat mayonnaise.   1 stalk celery, chopped.   2 slices tomato.   1 lettuce leaf.   1 cup carrot sticks.   24 to 30 seedless  grapes (2 carb servings).   6 oz light yogurt (1 carb serving).  Afternoon Snack  3 graham cracker squares (1 carb serving).   1 cup fat-free milk (1 carb serving).   1 tbs peanut butter.  Dinner  3 oz salmon, broiled with 1 tsp oil.   1 cup mashed potatoes (2 carb servings) with 1 tsp trans-fat-free margarine.   1 cup fresh or frozen green beans.   1 cup steamed asparagus.   1 cup fat-free milk (1 carb serving).  Evening Snack  3 cups of air-popped popcorn (1 carb serving).   2 tbs Parmesan cheese.  Meal Plan You can use this worksheet to help you make a daily meal plan based on the 1800 calorie diabetic diet suggestions. If you are using this plan to help you control your blood glucose, you may interchange carbohydrate-containing foods (dairy, starches, and fruits). Select a variety of fresh foods of varying colors and flavors. The total amount of carbohydrate in your meals or snacks is more important than making sure you include all of the food groups every time you eat. Choose from the approximate amount of the following foods to build your day's meals:  8 Starches.   4 Vegetables.   3 Fruits.   2 Dairy.   6 to 7 oz Meat/Protein.   Up to 4 Fats.  Your dietician can use this worksheet to help you decide how many servings and which types of foods are right for you. BREAKFAST Food Group and Servings / Food Choice Starches _______________________________________________________ Dairy __________________________________________________________ Fruit ___________________________________________________________ Meat/Protein ____________________________________________________ Fat ____________________________________________________________ LUNCH Food Group and Servings / Food Choice Starch _________________________________________________________ Meat/Protein ___________________________________________________ Vegetables  _____________________________________________________ Fruit __________________________________________________________ Dairy __________________________________________________________ Fat ____________________________________________________________ Andrew Fowler Food Group and Servings / Food Choice Starch ________________________________________________________ Meat/Protein ___________________________________________________ Fruit __________________________________________________________ Dairy __________________________________________________________ Andrew Fowler Food Group and Servings / Food Choice Starches _______________________________________________________ Meat/Protein ___________________________________________________ Dairy __________________________________________________________ Vegetable ______________________________________________________ Fruit ___________________________________________________________ Fat ____________________________________________________________ Andrew Fowler Food Group and Servings / Food Choice Fruit __________________________________________________________ Meat/Protein ___________________________________________________ Dairy __________________________________________________________ Starch _________________________________________________________ DAILY TOTALS Starches _________________________ Vegetables _______________________ Fruits ____________________________ Dairy ____________________________ Meat/Protein_____________________ Fats _____________________________ Document Released: 10/01/2004 Document Revised: 11/21/2010 Document Reviewed: 01/26/2009 [employer] Patient Information 2012 [place], [place].

## 2011-01-31 NOTE — Assessment & Plan Note (Signed)
Counseled but not ready to quit at this time.

## 2011-01-31 NOTE — Assessment & Plan Note (Signed)
Not indicated today.

## 2011-01-31 NOTE — Assessment & Plan Note (Signed)
1800 calorie diet pamphlet given today.

## 2011-01-31 NOTE — Assessment & Plan Note (Signed)
Well controlled 

## 2011-01-31 NOTE — Progress Notes (Signed)
  Subjective:    Patient ID: Andrew Fowler, male    DOB: Aug 21, 1960, 50 y.o.   MRN: [id number]  HPI Patient is today for regular follow up.  He was started on tapering doses of prednisone by his orthopedics for gout flare 3 weeks ago.  He does not complain of any pain in his joints at this time.  He is trying to lose weight and has lost about 10 pounds since last visit.  Still smoking about 1/2 pack per day.  No other complaints.  He has a little bump on his head which seems like a folliculitis. No fever, chills, pain noted. He did pinch it himself and had some discharge come out of it.   Review of Systems  Constitutional: Negative for fever, activity change and appetite change.  HENT: Negative for sore throat.   Respiratory: Negative for cough and shortness of breath.   Cardiovascular: Negative for chest pain and leg swelling.  Gastrointestinal: Negative for nausea, abdominal pain, diarrhea, constipation and abdominal distention.  Genitourinary: Negative for frequency, hematuria and difficulty urinating.  Neurological: Negative for dizziness and headaches.  Psychiatric/Behavioral: Negative for suicidal ideas and behavioral problems.       Objective:   Physical Exam  Constitutional: He is oriented to person, place, and time. He appears well-developed and well-nourished.       Wheel chair bound  HENT:  Head: Normocephalic and atraumatic.    Eyes: Conjunctivae and EOM are normal. Pupils are equal, round, and reactive to light. No scleral icterus.  Neck: Normal range of motion. Neck supple. No JVD present. No thyromegaly present.  Cardiovascular: Normal rate, regular rhythm, normal heart sounds and intact distal pulses.  Exam reveals no gallop and no friction rub.   No murmur heard. Pulmonary/Chest: Effort normal and breath sounds normal. No respiratory distress. He has no wheezes. He has no rales.  Abdominal: Soft. Bowel sounds are normal. He exhibits no distension and no  mass. There is no tenderness. There is no rebound and no guarding.  Musculoskeletal: Normal range of motion. He exhibits no edema and no tenderness.  Lymphadenopathy:    He has no cervical adenopathy.  Neurological: He is alert and oriented to person, place, and time.  Psychiatric: He has a normal mood and affect. His behavior is normal.          Assessment & Plan:

## 2011-02-18 ENCOUNTER — Other Ambulatory Visit: Payer: Self-pay | Admitting: Internal Medicine

## 2011-02-18 DIAGNOSIS — M109 Gout, unspecified: Secondary | ICD-10-CM

## 2011-02-18 MED ORDER — COLCHICINE 0.6 MG PO TABS: 0.6000 mg | ORAL_TABLET | Freq: Every day | ORAL | Status: DC

## 2011-02-18 NOTE — Assessment & Plan Note (Signed)
a 

## 2011-02-19 NOTE — Progress Notes (Signed)
Pt informed and voices understanding 

## 2011-03-07 ENCOUNTER — Other Ambulatory Visit: Payer: Self-pay | Admitting: Orthopedic Surgery

## 2011-03-07 DIAGNOSIS — M542 Cervicalgia: Secondary | ICD-10-CM

## 2011-03-12 ENCOUNTER — Telehealth: Payer: Self-pay | Admitting: Dietician

## 2011-03-12 DIAGNOSIS — E119 Type 2 diabetes mellitus without complications: Secondary | ICD-10-CM

## 2011-03-12 NOTE — Telephone Encounter (Signed)
Patient called saying he is now ready to have na eye exam, wants to know if he should call or if he needs another referral. He missed appointment with Dr. Herbert Deaner. Referral entered for tracking. Patient to call to  Tell us day and time of new appointment

## 2011-03-15 ENCOUNTER — Inpatient Hospital Stay: Admission: RE | Admit: 2011-03-15 | Payer: Medicaid Other | Source: Ambulatory Visit

## 2011-03-26 DIAGNOSIS — I872 Venous insufficiency (chronic) (peripheral): Secondary | ICD-10-CM

## 2011-03-26 DIAGNOSIS — R768 Other specified abnormal immunological findings in serum: Secondary | ICD-10-CM

## 2011-03-26 DIAGNOSIS — M069 Rheumatoid arthritis, unspecified: Secondary | ICD-10-CM

## 2011-03-26 HISTORY — DX: Rheumatoid arthritis, unspecified: M06.9

## 2011-03-26 HISTORY — DX: Venous insufficiency (chronic) (peripheral): I87.2

## 2011-03-26 HISTORY — DX: Other specified abnormal immunological findings in serum: R76.8

## 2011-03-28 ENCOUNTER — Other Ambulatory Visit: Payer: Self-pay | Admitting: *Deleted

## 2011-03-28 DIAGNOSIS — F329 Major depressive disorder, single episode, unspecified: Secondary | ICD-10-CM

## 2011-03-28 DIAGNOSIS — M549 Dorsalgia, unspecified: Secondary | ICD-10-CM

## 2011-03-28 DIAGNOSIS — E119 Type 2 diabetes mellitus without complications: Secondary | ICD-10-CM

## 2011-03-28 DIAGNOSIS — J449 Chronic obstructive pulmonary disease, unspecified: Secondary | ICD-10-CM

## 2011-03-28 MED ORDER — TRAMADOL HCL 50 MG PO TABS: 50.0000 mg | ORAL_TABLET | Freq: Four times a day (QID) | ORAL | Status: DC | PRN

## 2011-03-29 ENCOUNTER — Emergency Department (HOSPITAL_COMMUNITY)
Admission: EM | Admit: 2011-03-29 | Discharge: 2011-03-30 | Disposition: A | Discharge status: Home / Self Care | Payer: Medicaid Other | Attending: Emergency Medicine | Admitting: Emergency Medicine

## 2011-03-29 ENCOUNTER — Encounter (HOSPITAL_COMMUNITY): Payer: Self-pay | Admitting: Emergency Medicine

## 2011-03-29 ENCOUNTER — Emergency Department (HOSPITAL_COMMUNITY): Payer: Medicaid Other

## 2011-03-29 ENCOUNTER — Other Ambulatory Visit: Payer: Self-pay

## 2011-03-29 DIAGNOSIS — R209 Unspecified disturbances of skin sensation: Secondary | ICD-10-CM | POA: Insufficient documentation

## 2011-03-29 DIAGNOSIS — F17200 Nicotine dependence, unspecified, uncomplicated: Secondary | ICD-10-CM | POA: Insufficient documentation

## 2011-03-29 DIAGNOSIS — E119 Type 2 diabetes mellitus without complications: Secondary | ICD-10-CM | POA: Insufficient documentation

## 2011-03-29 DIAGNOSIS — E669 Obesity, unspecified: Secondary | ICD-10-CM | POA: Insufficient documentation

## 2011-03-29 DIAGNOSIS — R079 Chest pain, unspecified: Secondary | ICD-10-CM | POA: Insufficient documentation

## 2011-03-29 DIAGNOSIS — I10 Essential (primary) hypertension: Secondary | ICD-10-CM | POA: Insufficient documentation

## 2011-03-29 LAB — COMPREHENSIVE METABOLIC PANEL
CO2: 28 mEq/L (ref 19–32)
Calcium: 10 mg/dL (ref 8.4–10.5)
Creatinine, Ser: 0.7 mg/dL (ref 0.50–1.35)
GFR calc Af Amer: >90 mL/min (ref >90)
GFR calc non Af Amer: >90 mL/min (ref >90)
Glucose, Bld: 101 mg/dL — ABNORMAL HIGH (ref 70–99)

## 2011-03-29 LAB — DIFFERENTIAL
Eosinophils Relative: 2 % (ref 0–5)
Lymphocytes Relative: 14 % (ref 12–46)
Lymphs Abs: 1.6 10*3/uL (ref 0.7–4.0)
Monocytes Absolute: 0.7 10*3/uL (ref 0.1–1.0)

## 2011-03-29 LAB — CBC
HCT: 40.5 % (ref 39.0–52.0)
MCV: 91.6 fL (ref 78.0–100.0)
RBC: 4.42 MIL/uL (ref 4.22–5.81)
WBC: 11.3 10*3/uL — ABNORMAL HIGH (ref 4.0–10.5)

## 2011-03-29 LAB — CARDIAC PANEL(CRET KIN+CKTOT+MB+TROPI)
CK, MB: 1.4 ng/mL (ref 0.3–4.0)
Troponin I: <0.3 ng/mL (ref <0.30)

## 2011-03-29 MED ORDER — MORPHINE SULFATE 4 MG/ML IJ SOLN
4.0000 mg | Freq: Once | INTRAMUSCULAR | Status: AC
  Administered 2011-03-29: 4 mg via INTRAVENOUS
  Filled 2011-03-29: qty 1

## 2011-03-29 MED ORDER — ASPIRIN 81 MG PO CHEW
324.0000 mg | CHEWABLE_TABLET | Freq: Once | ORAL | Status: AC
  Administered 2011-03-29: 324 mg via ORAL
  Filled 2011-03-29: qty 4

## 2011-03-29 MED ORDER — ONDANSETRON HCL 4 MG/2ML IJ SOLN
4.0000 mg | Freq: Once | INTRAMUSCULAR | Status: AC
  Administered 2011-03-29: 4 mg via INTRAVENOUS
  Filled 2011-03-29: qty 2

## 2011-03-29 MED ORDER — ONDANSETRON HCL 8 MG PO TABS: 8.0000 mg | ORAL_TABLET | ORAL | Status: AC | PRN

## 2011-03-29 MED ORDER — MORPHINE SULFATE 4 MG/ML IJ SOLN
INTRAMUSCULAR | Status: AC
  Filled 2011-03-29: qty 1

## 2011-03-29 MED ORDER — MORPHINE SULFATE 4 MG/ML IJ SOLN
4.0000 mg | Freq: Once | INTRAMUSCULAR | Status: AC
  Administered 2011-03-29: 4 mg via INTRAVENOUS

## 2011-03-29 MED ORDER — OXYCODONE-ACETAMINOPHEN 5-325 MG PO TABS: 2.0000 | ORAL_TABLET | ORAL | Status: DC | PRN

## 2011-03-29 NOTE — ED Provider Notes (Addendum)
History     CSN: [id number]  Arrival date & time 03/29/11  60   First MD Initiated Contact with Patient 03/29/11 1554      Chief Complaint  Patient presents with  . Chest Pain  . Shoulder Pain  . Cough    (Consider location/radiation/quality/duration/timing/severity/associated sxs/prior treatment) HPI... chest pain since last night, described as pressure and tightness.no dyspnea, diaphoresis, nausea. He has chronic neck pain and is scheduled for an MRI tomorrow of same via Dr. Sharol Given.  Has tried ibuprofen without relief. No previous MI.  Has several cardiac risk factors.   nothing makes pain better or worse.    Past Medical History  Diagnosis Date  . COPD (chronic obstructive pulmonary disease)   . Hypertension   . Hyperlipidemia   . Arthritis   . Obesity   . Tobacco abuse   . Gout   . Diabetes mellitus   . Depression   . Obstructive sleep apnea   . Umbilical hernia     Past Surgical History  Procedure Date  . Back surgery 1995    lower back  . Joint replacement     left hip replacement    Family History  Problem Relation Age of Onset  . Diabetes Mother   . Heart attack Mother   . Heart disease Mother   . Diabetes Father   . Kidney disease Father     History  Substance Use Topics  . Smoking status: Current Everyday Smoker -- 0.5 packs/day    Types: Cigarettes  . Smokeless tobacco: Not on file  . Alcohol Use: 3.6 oz/week    6 Cans of beer per week      Review of Systems  All other systems reviewed and are negative.    Allergies  Review of patient's allergies indicates no known allergies.  Home Medications   Current Outpatient Rx  Name Route Sig Dispense Refill  . ALPRAZOLAM 0.5 MG PO TABS Oral Take 1 tablet (0.5 mg total) by mouth 3 (three) times daily as needed. For anxiety 90 tablet 3  . ASPIRIN 81 MG PO TBEC Oral Take 81 mg by mouth daily.      Marland Kitchen FLUOXETINE HCL 20 MG PO CAPS Oral Take 1 capsule (20 mg total) by mouth daily. 90 capsule 3  .  IBUPROFEN 200 MG PO TABS Oral Take 600 mg by mouth every 6 (six) hours as needed. pain     . LISINOPRIL 10 MG PO TABS Oral Take 1 tablet (10 mg total) by mouth daily. 90 tablet 3  . METFORMIN HCL 500 MG PO TABS Oral Take 1 tablet (500 mg total) by mouth 2 (two) times daily with a meal. 180 tablet 3  . NABUMETONE 750 MG PO TABS Oral Take 750 mg by mouth 2 (two) times daily as needed. Inflammation/pain     . SIMVASTATIN 40 MG PO TABS Oral Take 1 tablet (40 mg total) by mouth at bedtime. 90 tablet 1  . TRAMADOL HCL 50 MG PO TABS Oral Take 1 tablet (50 mg total) by mouth every 6 (six) hours as needed for pain. 60 tablet 3  . ALBUTEROL SULFATE HFA 108 (90 BASE) MCG/ACT IN AERS Inhalation Inhale 1-2 puffs into the lungs every 4 (four) hours as needed. shortness of breath      BP 113/66  Pulse 100  Temp 97.7 F (36.5 C)  Resp 16  SpO2 100%  Physical Exam  Nursing note and vitals reviewed. Constitutional: He is oriented to person,  place, and time. He appears well-developed and well-nourished.       obese  HENT:  Head: Normocephalic and atraumatic.  Eyes: Conjunctivae and EOM are normal. Pupils are equal, round, and reactive to light.  Neck: Normal range of motion. Neck supple.  Cardiovascular: Normal rate and regular rhythm.   Pulmonary/Chest: Effort normal and breath sounds normal.  Abdominal: Soft. Bowel sounds are normal.  Musculoskeletal: Normal range of motion.  Neurological: He is alert and oriented to person, place, and time.  Skin: Skin is warm and dry.  Psychiatric: He has a normal mood and affect.    ED Course  Procedures (including critical care time)  Labs Reviewed  CBC - Abnormal; Notable for the following:    WBC 11.3 (*)    All other components within normal limits  DIFFERENTIAL - Abnormal; Notable for the following:    Neutro Abs 8.7 (*)    All other components within normal limits  COMPREHENSIVE METABOLIC PANEL  CARDIAC PANEL(CRET KIN+CKTOT+MB+TROPI)   Dg Chest  Port 1 View  03/29/2011  *RADIOLOGY REPORT*  Clinical Data: Numbness and tingling in both arms and legs. Smoker.  Hypertension.  PORTABLE CHEST - 1 VIEW  Comparison: 12/26/2010.  Findings: Poor inspiration.  Mildly enlarged cardiac silhouette. Clear lungs with normal vascularity.  Interstitial markings are mildly prominent.  Unremarkable bones.  IMPRESSION: Cardiomegaly and mild chronic interstitial lung disease.  Original Report Authenticated By: Gerald Stabs, M.D.     No diagnosis found.  Date: 03/29/2011  Rate: 97  Rhythm: normal sinus rhythm  QRS Axis: normal  Intervals: normal  ST/T Wave abnormalities: normal  Conduction Disutrbances:none  Narrative Interpretation:   Old EKG Reviewed: changes noted   MDM  Will do rule out protocol. Patient has multiple cardiac risk factors.    Recheck at 11:15. Doing much better. We'll follow up with primary care     Nat Christen, MD 03/29/11 2046  Nat Christen, MD 03/29/11 [place], MD 03/29/11 [phone]

## 2011-03-29 NOTE — ED Notes (Signed)
Per EMS.  Pt has hx of neck/back injury which is beginning to cause pain radiating into his arms and hand. Pt is scheduled for MRI tomarrow.  He also has some chest wall pain that is worse with deep breath and palpation.  States he has had cold sx recently and he is coughing up green sputum.

## 2011-03-29 NOTE — ED Notes (Signed)
Ultrasound at bedside

## 2011-03-30 ENCOUNTER — Ambulatory Visit
Admission: RE | Admit: 2011-03-30 | Discharge: 2011-03-30 | Disposition: A | Discharge status: Home / Self Care | Payer: Medicaid Other | Source: Ambulatory Visit | Attending: Orthopedic Surgery | Admitting: Orthopedic Surgery

## 2011-03-30 DIAGNOSIS — M542 Cervicalgia: Secondary | ICD-10-CM

## 2011-04-02 ENCOUNTER — Encounter: Payer: Self-pay | Admitting: Internal Medicine

## 2011-04-02 ENCOUNTER — Ambulatory Visit (INDEPENDENT_AMBULATORY_CARE_PROVIDER_SITE_OTHER): Payer: Medicaid Other | Admitting: Internal Medicine

## 2011-04-02 ENCOUNTER — Other Ambulatory Visit: Payer: Self-pay | Admitting: Internal Medicine

## 2011-04-02 VITALS — BP 100/70 | HR 96 | Temp 97.1°F | Wt 309.0 lb

## 2011-04-02 DIAGNOSIS — M1990 Unspecified osteoarthritis, unspecified site: Secondary | ICD-10-CM

## 2011-04-02 MED ORDER — NAPROXEN SODIUM 220 MG PO TABS: 440.0000 mg | ORAL_TABLET | Freq: Two times a day (BID) | ORAL | Status: DC

## 2011-04-02 MED ORDER — ACETAMINOPHEN 325 MG PO TABS: 650.0000 mg | ORAL_TABLET | ORAL | Status: DC | PRN

## 2011-04-02 NOTE — Patient Instructions (Signed)
  Please complete the medications as prescribed Please call or come back to hospital if your symptoms get severely worse.

## 2011-04-02 NOTE — Progress Notes (Signed)
Patient ID: Andrew Fowler, male   DOB: [birth year], 51 y.o.   MRN: [id number]   HPI Patient is 51 YO male with PMH of hypertension, DM-II, depression, COPD, morbid obesity and s/p of left hip replacement presents with bilateral shoulder pain and neck pain. Per patient, his pain started about 6 weeks ago. The pain is aching, radiating to his arms and hands bilaterally. He visited orthopedic, Dr. Trecia Rogers ordered a MRI test. Unfortunately, he could not complete this test due to severe pain. He has been prescribed 20 tablet of percocet which relived his pain. But after running off the percocet, he still have pain. Several pain medications are on his med list, including ibuprofen, percocet and tramadol. He side that he is taking them some times, not regularly.  Denies fever, chills, fatigue, headaches,  cough, chest pain, SOB,  abdominal pain,diarrhea, constipation, dysuria, urgency, frequency, hematuria, joint pain or leg swelling.   Past Medical History  Diagnosis Date  . COPD (chronic obstructive pulmonary disease)   . Hypertension   . Hyperlipidemia   . Arthritis   . Obesity   . Tobacco abuse   . Gout   . Diabetes mellitus   . Depression   . Obstructive sleep apnea   . Umbilical hernia    Current Outpatient Prescriptions  Medication Sig Dispense Refill  . albuterol (VENTOLIN HFA) 108 (90 BASE) MCG/ACT inhaler Inhale 1-2 puffs into the lungs every 4 (four) hours as needed. shortness of breath      . ALPRAZolam (XANAX) 0.5 MG tablet Take 1 tablet (0.5 mg total) by mouth 3 (three) times daily as needed. For anxiety  90 tablet  3  . aspirin (BAYER LOW STRENGTH) 81 MG EC tablet Take 81 mg by mouth daily.        Marland Kitchen FLUoxetine (PROZAC) 20 MG capsule Take 1 capsule (20 mg total) by mouth daily.  90 capsule  3  . lisinopril (PRINIVIL,ZESTRIL) 10 MG tablet Take 1 tablet (10 mg total) by mouth daily.  90 tablet  3  . metFORMIN (GLUCOPHAGE) 500 MG tablet Take 1 tablet (500 mg total) by mouth 2 (two)  times daily with a meal.  180 tablet  3  . ondansetron (ZOFRAN) 8 MG tablet Take 1 tablet (8 mg total) by mouth every 4 (four) hours as needed for nausea.  12 tablet  0  . simvastatin (ZOCOR) 40 MG tablet Take 1 tablet (40 mg total) by mouth at bedtime.  90 tablet  1  . acetaminophen (TYLENOL) 325 MG tablet Take 2 tablets (650 mg total) by mouth every 4 (four) hours as needed for pain.  100 tablet  2  . naproxen sodium (AF-NAPROXEN SODIUM) 220 MG tablet Take 2 tablets (440 mg total) by mouth 2 (two) times daily with a meal.  120 tablet  2   Family History  Problem Relation Age of Onset  . Diabetes Mother   . Heart attack Mother   . Heart disease Mother   . Diabetes Father   . Kidney disease Father    History   Social History  . Marital Status: Single    Spouse Name: N/A    Number of Children: N/A  . Years of Education: N/A   Social History Main Topics  . Smoking status: Current Everyday Smoker -- 0.5 packs/day    Types: Cigarettes  . Smokeless tobacco: None  . Alcohol Use: 3.6 oz/week    6 Cans of beer per week  . Drug Use: No  .  Sexually Active: None   Other Topics Concern  . None   Social History Narrative  . None    ROS: General: no fevers, chills, no changes in body weight, no changes in appetite Skin: no rash HEENT: no blurry vision, hearing changes or sore throat Pulm: no dyspnea, coughing, wheezing CV: no chest pain, palpitations, shortness of breath Abd: no nausea/vomiting, abdominal pain, diarrhea/constipation GU: no dysuria, hematuria, polyuria Ext: tender over shoulders bilaterally. There is no redness or warmth or swelling over his shoulder. Numbness on the ulnar side of hands.  Neuro: no weakness, numbness, or tingling. Brachial reflexes are 2+ bilaterally.   Objective:  Physical Exam: Filed Vitals:   04/02/11 1451 04/02/11 1452  BP:  100/70  Pulse:  96  Temp: 97.1 F (36.2 C)   Weight: 309 lb (140.161 kg)   SpO2: 95%     Head: Normocephalic  and atraumatic Ear: TM normal bilaterally Mouth: no erythema or exudates, MMM Eyes: PERRL, EOMI, conjunctivae normal, No scleral icterus.  Neck: Supple, Trachea midline normal ROM, No JVD, mass, thyromegaly, or carotid bruit present. tender at back of his neck at C5 to C7 level. Cardiovascular: RRR, S1 normal, S2 normal, no MRG, pulses symmetric and intact bilaterally Pulmonary/Chest: CTAB, no wheezes, rales, or rhonchi Abdominal: Soft. Non-tender, non-distended, bowel sounds are normal, no masses, organomegaly, or guarding present.  GU: no CVA tenderness Ext: tender over shoulders bilaterally. There is no redness or warmth or swelling over his shoulder. Numbness on the ulnar side of hands. Normal muscle strength and sensation to light touch. Hematology: no cervical, inginal, or axillary adenopathy.  Neurological: A&O x3, Strenght is normal and symmetric bilaterally, cranial nerve II-XII are grossly intact, no focal motor deficit, sensory intact to light touch bilaterally. Brachial reflexes are 2+ bilaterally. Skin: Warm, dry and intact. No rash, cyanosis, or clubbing.  Psychiatric: Normal mood and affect. speech and behavior is normal. Judgment and thought content normal. Cognition and memory are normal.   Assessment & Plan:   # shoulder and neck pain: patient had an abnormal MRI-spin in 5/12, which showed multilevel disc protrusions, To significant left foraminal stenosis at C4-5 and C5-6. This is most likely the source of his pain. Patient  Was seen by orthopedic, Dr. Trecia Rogers in the last week. Dr. Trecia Rogers ordered MRI for him. Unfortunately, he could not complete this test due to severe pain. Patient said that he will go back to Dr. Trecia Rogers next week for further evaluation. Today I prescribed Naproxen and tylenol for and anti-inflamatory and symptomatic treatment. I discontinued his ibuprofen, percocet, tramadol and Relafen.  He finally still needs orthopedic evaluation. I advice patient to follow up with  Dr. Trecia Rogers for further evaluation and he promised that he will do it.  # DM-II: well controlled. His meter record shows that his glucose is 89% within the target range. Will continue the current regimen.  # HYPERTENSION - Patient HTN is well controlled. His Bp is 100/70 mmHg. Will continue current regimen.

## 2011-04-03 NOTE — Progress Notes (Signed)
I agree with Dr. Edgar Frisk assessment and plan.

## 2011-04-04 ENCOUNTER — Telehealth: Payer: Self-pay | Admitting: *Deleted

## 2011-04-04 ENCOUNTER — Other Ambulatory Visit: Payer: Self-pay | Admitting: Internal Medicine

## 2011-04-04 DIAGNOSIS — M1990 Unspecified osteoarthritis, unspecified site: Secondary | ICD-10-CM

## 2011-04-04 MED ORDER — TRAMADOL HCL 50 MG PO TABS: 50.0000 mg | ORAL_TABLET | Freq: Every evening | ORAL | Status: DC | PRN

## 2011-04-04 NOTE — Telephone Encounter (Signed)
Pt informed tramadol has been called in for him.

## 2011-04-04 NOTE — Telephone Encounter (Signed)
Pt called in stating tylenol and naproxen 220 mg is only helping a little.  Pain is increase at night. He can not afford the OTC meds and wants to know if you can write a Rx for a stronger dose so medicaid will pay for it.  Insurance will pay if it's not OTC strength. [facility] on [address]. [phone]

## 2011-04-13 ENCOUNTER — Inpatient Hospital Stay: Admission: RE | Admit: 2011-04-13 | Payer: Medicaid Other | Source: Ambulatory Visit

## 2011-04-13 ENCOUNTER — Encounter (HOSPITAL_COMMUNITY): Payer: Self-pay | Admitting: Emergency Medicine

## 2011-04-13 ENCOUNTER — Emergency Department (HOSPITAL_COMMUNITY)
Admission: EM | Admit: 2011-04-13 | Discharge: 2011-04-13 | Disposition: A | Discharge status: Home / Self Care | Payer: Medicaid Other | Attending: Emergency Medicine | Admitting: Emergency Medicine

## 2011-04-13 ENCOUNTER — Emergency Department (HOSPITAL_COMMUNITY): Payer: Medicaid Other

## 2011-04-13 DIAGNOSIS — R059 Cough, unspecified: Secondary | ICD-10-CM | POA: Insufficient documentation

## 2011-04-13 DIAGNOSIS — F17200 Nicotine dependence, unspecified, uncomplicated: Secondary | ICD-10-CM | POA: Insufficient documentation

## 2011-04-13 DIAGNOSIS — Z7982 Long term (current) use of aspirin: Secondary | ICD-10-CM | POA: Insufficient documentation

## 2011-04-13 DIAGNOSIS — E669 Obesity, unspecified: Secondary | ICD-10-CM | POA: Insufficient documentation

## 2011-04-13 DIAGNOSIS — M109 Gout, unspecified: Secondary | ICD-10-CM | POA: Insufficient documentation

## 2011-04-13 DIAGNOSIS — J449 Chronic obstructive pulmonary disease, unspecified: Secondary | ICD-10-CM | POA: Insufficient documentation

## 2011-04-13 DIAGNOSIS — G4733 Obstructive sleep apnea (adult) (pediatric): Secondary | ICD-10-CM | POA: Insufficient documentation

## 2011-04-13 DIAGNOSIS — R0602 Shortness of breath: Secondary | ICD-10-CM | POA: Insufficient documentation

## 2011-04-13 DIAGNOSIS — I10 Essential (primary) hypertension: Secondary | ICD-10-CM | POA: Insufficient documentation

## 2011-04-13 DIAGNOSIS — E119 Type 2 diabetes mellitus without complications: Secondary | ICD-10-CM | POA: Insufficient documentation

## 2011-04-13 DIAGNOSIS — R609 Edema, unspecified: Secondary | ICD-10-CM | POA: Insufficient documentation

## 2011-04-13 DIAGNOSIS — R05 Cough: Secondary | ICD-10-CM | POA: Insufficient documentation

## 2011-04-13 DIAGNOSIS — Z79899 Other long term (current) drug therapy: Secondary | ICD-10-CM | POA: Insufficient documentation

## 2011-04-13 DIAGNOSIS — J4489 Other specified chronic obstructive pulmonary disease: Secondary | ICD-10-CM | POA: Insufficient documentation

## 2011-04-13 DIAGNOSIS — Z96649 Presence of unspecified artificial hip joint: Secondary | ICD-10-CM | POA: Insufficient documentation

## 2011-04-13 DIAGNOSIS — M129 Arthropathy, unspecified: Secondary | ICD-10-CM | POA: Insufficient documentation

## 2011-04-13 MED ORDER — HYDROCODONE-ACETAMINOPHEN 5-325 MG PO TABS: 1.0000 | ORAL_TABLET | Freq: Four times a day (QID) | ORAL | Status: DC | PRN

## 2011-04-13 MED ORDER — HYDROCODONE-ACETAMINOPHEN 5-325 MG PO TABS
2.0000 | ORAL_TABLET | Freq: Once | ORAL | Status: AC
  Administered 2011-04-13: 2 via ORAL
  Filled 2011-04-13: qty 2

## 2011-04-13 MED ORDER — IPRATROPIUM BROMIDE 0.02 % IN SOLN
0.5000 mg | Freq: Once | RESPIRATORY_TRACT | Status: AC
  Administered 2011-04-13: 0.5 mg via RESPIRATORY_TRACT
  Filled 2011-04-13: qty 2.5

## 2011-04-13 MED ORDER — ALBUTEROL SULFATE (5 MG/ML) 0.5% IN NEBU
5.0000 mg | INHALATION_SOLUTION | Freq: Once | RESPIRATORY_TRACT | Status: AC
  Administered 2011-04-13: 5 mg via RESPIRATORY_TRACT
  Filled 2011-04-13: qty 1

## 2011-04-13 NOTE — ED Notes (Signed)
Assisted to sitting up at bedside---c/o achiness of shoulders, back, all joints.

## 2011-04-13 NOTE — ED Notes (Signed)
RTJ:[id number]<YT> Expected date:04/13/11<BR> Expected time: 3:49 AM<BR> Means of arrival:Ambulance<BR> Comments:<BR> Flu like s/s, generalized pain and weakness

## 2011-04-13 NOTE — ED Notes (Signed)
Vital signs are HR 110, Oral Temp 99.5, R 24, B/P 115/76  Pulse Ox 100% on 2 liters 02

## 2011-04-13 NOTE — ED Notes (Signed)
Assisted to sitting on bedside---pt. Very anxious--unable to maintain a position of comfort--talking to self

## 2011-04-13 NOTE — ED Notes (Signed)
Reports feeling he can breathe easier after neb trt.

## 2011-04-13 NOTE — ED Provider Notes (Signed)
History     CSN: [id number]  Arrival date & time 04/13/11  5170   First MD Initiated Contact with Patient 04/13/11 [phone]      Chief Complaint  Patient presents with  . Cough    (Consider location/radiation/quality/duration/timing/severity/associated sxs/prior treatment) HPI This is a 51 year old white male with several day history of cough, shortness of breath, wheezing, generalized body aches, malaise and difficulty sleeping due to the pain. He is not aware of having a fever but was noted to have a temperature of 99.5 here. He states he has severe generalized pain and is not getting relief with naproxen sodium. He states his sugars have been well controlled recently despite his history of diabetes. He he states he has some edema of the ankles and hands. He denies nausea, vomiting or diarrhea . He did have some constipation earlier in the week is resolved. He is being treated for arthritis and has an MRI scheduled for later today.  Past Medical History  Diagnosis Date  . COPD (chronic obstructive pulmonary disease)   . Hypertension   . Hyperlipidemia   . Arthritis   . Obesity   . Tobacco abuse   . Gout   . Diabetes mellitus   . Depression   . Obstructive sleep apnea   . Umbilical hernia     Past Surgical History  Procedure Date  . Back surgery 1995    lower back  . Joint replacement     left hip replacement    Family History  Problem Relation Age of Onset  . Diabetes Mother   . Heart attack Mother   . Heart disease Mother   . Diabetes Father   . Kidney disease Father     History  Substance Use Topics  . Smoking status: Current Everyday Smoker -- 1.0 packs/day    Types: Cigarettes  . Smokeless tobacco: Not on file  . Alcohol Use: 0.0 oz/week     weekends      Review of Systems  All other systems reviewed and are negative.    Allergies  Review of patient's allergies indicates no known allergies.  Home Medications   Current Outpatient Rx  Name Route  Sig Dispense Refill  . ACETAMINOPHEN 325 MG PO TABS Oral Take 2 tablets (650 mg total) by mouth every 4 (four) hours as needed for pain. 100 tablet 2  . ALPRAZOLAM 0.5 MG PO TABS Oral Take 1 tablet (0.5 mg total) by mouth 3 (three) times daily as needed. For anxiety 90 tablet 3  . ASPIRIN 81 MG PO TBEC Oral Take 81 mg by mouth daily.      Marland Kitchen FLUOXETINE HCL 20 MG PO CAPS Oral Take 1 capsule (20 mg total) by mouth daily. 90 capsule 3  . LISINOPRIL 10 MG PO TABS Oral Take 1 tablet (10 mg total) by mouth daily. 90 tablet 3  . METFORMIN HCL 500 MG PO TABS Oral Take 1 tablet (500 mg total) by mouth 2 (two) times daily with a meal. 180 tablet 3  . NAPROXEN SODIUM 220 MG PO TABS Oral Take 2 tablets (440 mg total) by mouth 2 (two) times daily with a meal. 120 tablet 2  . SIMVASTATIN 40 MG PO TABS Oral Take 1 tablet (40 mg total) by mouth at bedtime. 90 tablet 1  . ALBUTEROL SULFATE HFA 108 (90 BASE) MCG/ACT IN AERS Inhalation Inhale 1-2 puffs into the lungs every 4 (four) hours as needed. shortness of breath      BP  115/76  Pulse 110  Temp(Src) 99.5 F (37.5 C) (Oral)  Resp 24  Ht 5' 9"  (1.753 m)  Wt 309 lb (140.161 kg)  BMI 45.63 kg/m2  SpO2 100%  Physical Exam General: Well-developed, well-nourished male in no acute distress; appearance consistent with age of record HENT: normocephalic, atraumatic Eyes: pupils equal round and reactive to light; extraocular muscles intact Neck: supple Heart: regular rate and rhythm Lungs: Expiratory wheezes Abdomen: soft; nondistended; nontender Extremities: No deformity; full range of motion; trace edema of lower legs and hands Neurologic: Awake, alert and oriented; motor function intact in all extremities and symmetric; no facial droop Skin: Warm and dry Psychiatric: Flat affect    ED Course  Procedures (including critical care time)     MDM   Nursing notes and vitals signs, including pulse oximetry, reviewed.  Summary of this visit's results,  reviewed by myself:  Labs:  Results for orders placed in visit on 04/02/11  GLUCOSE, CAPILLARY      Component Value Range   Glucose-Capillary 107 (*) 70 - 99 (mg/dL)    Imaging Studies: Dg Chest 2 View  04/13/2011  *RADIOLOGY REPORT*  Clinical Data: Shortness of breath.  CHEST - 2 VIEW  Comparison: 03/29/2011  Findings: Heart is borderline in size.  Mild vascular congestion. Low lung volumes.  No confluent opacities or effusions.  No edema.  IMPRESSION: Borderline heart size.  Vascular congestion.  Original Report Authenticated By: Raelyn Number, M.D.            Wynetta Fines, MD 04/13/11 [phone]

## 2011-04-13 NOTE — ED Notes (Addendum)
C/o generalized achiness, chills, non productive cough and dark blood in stool yesterday.  History for gout--uses a wheelchair at home--brother assists him with ADL's.

## 2011-04-13 NOTE — ED Notes (Signed)
02 sat on room air has fluctuated between 95-97%---Neb trt in progress.

## 2011-04-15 ENCOUNTER — Encounter (HOSPITAL_COMMUNITY): Payer: Self-pay

## 2011-04-15 ENCOUNTER — Emergency Department (HOSPITAL_COMMUNITY)
Admission: EM | Admit: 2011-04-15 | Discharge: 2011-04-15 | Disposition: A | Discharge status: Home / Self Care | Payer: Medicaid Other | Attending: Emergency Medicine | Admitting: Emergency Medicine

## 2011-04-15 ENCOUNTER — Ambulatory Visit
Admit: 2011-04-15 | Discharge: 2011-04-15 | Disposition: A | Discharge status: Home / Self Care | Payer: Medicaid Other | Attending: Orthopedic Surgery | Admitting: Orthopedic Surgery

## 2011-04-15 DIAGNOSIS — G4733 Obstructive sleep apnea (adult) (pediatric): Secondary | ICD-10-CM | POA: Insufficient documentation

## 2011-04-15 DIAGNOSIS — M79629 Pain in unspecified upper arm: Secondary | ICD-10-CM

## 2011-04-15 DIAGNOSIS — J4489 Other specified chronic obstructive pulmonary disease: Secondary | ICD-10-CM | POA: Insufficient documentation

## 2011-04-15 DIAGNOSIS — G894 Chronic pain syndrome: Secondary | ICD-10-CM | POA: Insufficient documentation

## 2011-04-15 DIAGNOSIS — F411 Generalized anxiety disorder: Secondary | ICD-10-CM | POA: Insufficient documentation

## 2011-04-15 DIAGNOSIS — J449 Chronic obstructive pulmonary disease, unspecified: Secondary | ICD-10-CM | POA: Insufficient documentation

## 2011-04-15 DIAGNOSIS — F17200 Nicotine dependence, unspecified, uncomplicated: Secondary | ICD-10-CM | POA: Insufficient documentation

## 2011-04-15 DIAGNOSIS — I10 Essential (primary) hypertension: Secondary | ICD-10-CM | POA: Insufficient documentation

## 2011-04-15 DIAGNOSIS — M542 Cervicalgia: Secondary | ICD-10-CM | POA: Insufficient documentation

## 2011-04-15 DIAGNOSIS — E119 Type 2 diabetes mellitus without complications: Secondary | ICD-10-CM | POA: Insufficient documentation

## 2011-04-15 DIAGNOSIS — M25519 Pain in unspecified shoulder: Secondary | ICD-10-CM | POA: Insufficient documentation

## 2011-04-15 MED ORDER — OXYCODONE-ACETAMINOPHEN 5-325 MG PO TABS
1.0000 | ORAL_TABLET | Freq: Four times a day (QID) | ORAL | Status: DC | PRN
Start: 2011-04-15 — End: 2011-04-17

## 2011-04-15 MED ORDER — HYDROMORPHONE HCL PF 1 MG/ML IJ SOLN
1.0000 mg | Freq: Once | INTRAMUSCULAR | Status: AC
  Administered 2011-04-15: 1 mg via INTRAVENOUS
  Filled 2011-04-15: qty 1

## 2011-04-15 MED ORDER — ACETAMINOPHEN 325 MG PO TABS
650.0000 mg | ORAL_TABLET | Freq: Once | ORAL | Status: AC
  Administered 2011-04-15: 650 mg via ORAL
  Filled 2011-04-15: qty 2

## 2011-04-15 MED ORDER — DIAZEPAM 5 MG/ML IJ SOLN
5.0000 mg | Freq: Once | INTRAMUSCULAR | Status: AC
  Administered 2011-04-15: 5 mg via INTRAVENOUS
  Filled 2011-04-15: qty 2

## 2011-04-15 MED ORDER — DIAZEPAM 5 MG PO TABS: 5.0000 mg | ORAL_TABLET | Freq: Three times a day (TID) | ORAL | Status: DC | PRN

## 2011-04-15 MED ORDER — ONDANSETRON HCL 4 MG/2ML IJ SOLN
4.0000 mg | Freq: Once | INTRAMUSCULAR | Status: AC
  Administered 2011-04-15: 4 mg via INTRAVENOUS
  Filled 2011-04-15: qty 2

## 2011-04-15 MED ORDER — IBUPROFEN 200 MG PO TABS
400.0000 mg | ORAL_TABLET | Freq: Once | ORAL | Status: AC
  Administered 2011-04-15: 400 mg via ORAL
  Filled 2011-04-15: qty 2

## 2011-04-15 NOTE — ED Notes (Signed)
Patient presents with generalized, arthritic/chronic pain x six months with worsening pain over past few weeks.  Patient especially reporting pain to bilateral upper extremities.  Patient has been seen for similar symptoms before.

## 2011-04-17 ENCOUNTER — Telehealth: Payer: Self-pay | Admitting: *Deleted

## 2011-04-17 ENCOUNTER — Ambulatory Visit: Payer: Medicaid Other | Admitting: Internal Medicine

## 2011-04-17 ENCOUNTER — Encounter (HOSPITAL_COMMUNITY): Payer: Self-pay | Admitting: *Deleted

## 2011-04-17 ENCOUNTER — Emergency Department (HOSPITAL_COMMUNITY)
Admission: EM | Admit: 2011-04-17 | Discharge: 2011-04-17 | Disposition: A | Discharge status: Home / Self Care | Payer: Medicaid Other | Attending: Emergency Medicine | Admitting: Emergency Medicine

## 2011-04-17 DIAGNOSIS — R609 Edema, unspecified: Secondary | ICD-10-CM | POA: Insufficient documentation

## 2011-04-17 DIAGNOSIS — E119 Type 2 diabetes mellitus without complications: Secondary | ICD-10-CM | POA: Insufficient documentation

## 2011-04-17 DIAGNOSIS — M2550 Pain in unspecified joint: Secondary | ICD-10-CM | POA: Insufficient documentation

## 2011-04-17 DIAGNOSIS — I10 Essential (primary) hypertension: Secondary | ICD-10-CM | POA: Insufficient documentation

## 2011-04-17 DIAGNOSIS — F17200 Nicotine dependence, unspecified, uncomplicated: Secondary | ICD-10-CM | POA: Insufficient documentation

## 2011-04-17 DIAGNOSIS — J4489 Other specified chronic obstructive pulmonary disease: Secondary | ICD-10-CM | POA: Insufficient documentation

## 2011-04-17 DIAGNOSIS — J449 Chronic obstructive pulmonary disease, unspecified: Secondary | ICD-10-CM | POA: Insufficient documentation

## 2011-04-17 DIAGNOSIS — R600 Localized edema: Secondary | ICD-10-CM

## 2011-04-17 DIAGNOSIS — E785 Hyperlipidemia, unspecified: Secondary | ICD-10-CM | POA: Insufficient documentation

## 2011-04-17 MED ORDER — HYDROMORPHONE HCL PF 2 MG/ML IJ SOLN
2.0000 mg | Freq: Once | INTRAMUSCULAR | Status: AC
  Administered 2011-04-17: 2 mg via INTRAMUSCULAR
  Filled 2011-04-17: qty 1

## 2011-04-17 MED ORDER — OXYCODONE-ACETAMINOPHEN 5-325 MG PO TABS: ORAL_TABLET | ORAL | Status: DC

## 2011-04-17 NOTE — ED Notes (Signed)
Per EMS: Pt from home, non-ambulatory. Reports gout pain all over in joints. NAD noted.

## 2011-04-17 NOTE — ED Notes (Signed)
Pt reports pain and joint swelling x2 months. Believes it is gout. Seen two days ago for same. Given Percocet, sts "these only work for a few hours."

## 2011-04-17 NOTE — Telephone Encounter (Signed)
Pt calls and states medicaid has informed him that he must call his pcp office before going to ED, he states when he calls he needs to be seen then not later, it is explained that usually there are long waits in the ED and if he would call office, wait for a return call and keep appts on a regular basis there should not be a problem. He is ask if he could have appt asap, offered today and pt refused citing transportation. appt is made for fri pm dr Newt Lukes. He states he has pain in hands, arms, shoulders, neck and back.

## 2011-04-17 NOTE — Telephone Encounter (Signed)
Pt just called and stated" i'm going to the emergency room for pain medicine" i informed him that he should let me try to schedule an appt at which time he told me he couldn't wait and he was just calling to let the clinic know, i advised him that this should be addressed at an appt and that an appt could be scheduled, he refused and hung up

## 2011-04-17 NOTE — ED Provider Notes (Signed)
History     CSN: [id number]  Arrival date & time 04/17/11  1521   First MD Initiated Contact with Patient 04/17/11 1714      Chief Complaint  Patient presents with  . Gout    (Consider location/radiation/quality/duration/timing/severity/associated sxs/prior treatment) HPI  Patient who has been followed by Dr. Blaine Hamper and Dr. Sharol Given (PCP and ortho) for bilateral hand pain and swelling as well as shoulder pain presents to ER complaining of a 2 month hx of waxing and waning bilateral shoulder and hand pain with bilateral hand edema that he states he will get temporary relief after anti-inflammatories and percocet but after running out of medication the pain will return and be severe. Patient states that he was been told "maybe gout" and that Dr. Sharol Given had set him up for MRI but that he was unable to perform MRI due to pain in shoulders when lying flat. Patient states the pain has been the same pain x 2 months without new or changing symptoms. Patient denies fevers, chills, erythema or breaks in skin. Patient states he has a f/u with PCP in 2 days for further evaluation. Patient states he called PCP today and had appt this afternoon but did not have transportation and therefore called EMS. Pain is aggravated by touch and movement and temporarily relieved with pain medication use.   Past Medical History  Diagnosis Date  . COPD (chronic obstructive pulmonary disease)   . Hypertension   . Hyperlipidemia   . Arthritis   . Obesity   . Tobacco abuse   . Gout   . Diabetes mellitus   . Depression   . Obstructive sleep apnea   . Umbilical hernia     Past Surgical History  Procedure Date  . Back surgery 1995    lower back  . Joint replacement     left hip replacement  . Total hip arthroplasty     to left hip    Family History  Problem Relation Age of Onset  . Diabetes Mother   . Heart attack Mother   . Heart disease Mother   . Diabetes Father   . Kidney disease Father     History    Substance Use Topics  . Smoking status: Current Everyday Smoker -- 1.0 packs/day    Types: Cigarettes  . Smokeless tobacco: Not on file  . Alcohol Use: 0.0 oz/week     weekends      Review of Systems  All other systems reviewed and are negative.    Allergies  Review of patient's allergies indicates no known allergies.  Home Medications   Current Outpatient Rx  Name Route Sig Dispense Refill  . ALBUTEROL SULFATE HFA 108 (90 BASE) MCG/ACT IN AERS Inhalation Inhale 1-2 puffs into the lungs every 4 (four) hours as needed. shortness of breath    . ALPRAZOLAM 0.5 MG PO TABS Oral Take 0.5 mg by mouth 3 (three) times daily. For anxiety    . ASPIRIN 81 MG PO TBEC Oral Take 81 mg by mouth daily.      Marland Kitchen DIAZEPAM 5 MG PO TABS Oral Take 5 mg by mouth every 8 (eight) hours as needed. For anxiety    . FLUOXETINE HCL 20 MG PO CAPS Oral Take 1 capsule (20 mg total) by mouth daily. 90 capsule 3  . HYDROCODONE-ACETAMINOPHEN 5-325 MG PO TABS Oral Take 1-2 tablets by mouth every 6 (six) hours as needed. For pain    . IBUPROFEN 200 MG PO TABS Oral  Take 800-1,200 mg by mouth 6 (six) times daily. For pain    . LISINOPRIL 10 MG PO TABS Oral Take 1 tablet (10 mg total) by mouth daily. 90 tablet 3  . METFORMIN HCL 500 MG PO TABS Oral Take 1 tablet (500 mg total) by mouth 2 (two) times daily with a meal. 180 tablet 3  . OXYCODONE-ACETAMINOPHEN 5-325 MG PO TABS Oral Take 1-2 tablets by mouth every 6 (six) hours as needed. For pain    . SIMVASTATIN 40 MG PO TABS Oral Take 1 tablet (40 mg total) by mouth at bedtime. 90 tablet 1    BP 139/93  Pulse 112  Temp(Src) 97.8 F (36.6 C) (Oral)  Resp 20  SpO2 96%  Physical Exam  Nursing note and vitals reviewed. Constitutional: He is oriented to person, place, and time. He appears well-developed and well-nourished. No distress.  HENT:  Head: Normocephalic and atraumatic.  Eyes: Conjunctivae are normal.  Neck: Normal range of motion. Neck supple.   Cardiovascular: Normal rate, regular rhythm, normal heart sounds and intact distal pulses.  Exam reveals no gallop and no friction rub.   No murmur heard. Pulmonary/Chest: Effort normal and breath sounds normal. No respiratory distress. He has no wheezes. He has no rales. He exhibits no tenderness.  Abdominal: Bowel sounds are normal. He exhibits no distension and no mass. There is no tenderness. There is no rebound and no guarding.  Musculoskeletal: Normal range of motion. He exhibits edema and tenderness.       TTP of bilateral shoulders and hands with soft tissue swelling of dorsal of bilateral hands and fingers but no erythema or crepitous. 5/5 grip but with pain. Good radial pulse and cap refill bilaterally. FROM of bilateral shoulders but with pain. No TTP of neck and FROM of neck without pain. FROM of LE without pain. No edema of LE's.   Neurological: He is alert and oriented to person, place, and time.  Skin: Skin is warm and dry. No rash noted. He is not diaphoretic. No erythema.  Psychiatric: He has a normal mood and affect.    ED Course  Procedures (including critical care time)  IM dilaudid  Labs Reviewed - No data to display No results found.   1. Arthralgia   2. Edema of upper extremity       MDM  poly arthralgias of UE but without signs or symptoms of septic joints or central cord compression. Patient is being followed by PCP and ortho for same symptoms for greater than 2 months. Afebrile. NAD. Patient has follow up appointment in 2 days with PCP.         Eben Burow, PA 04/18/11 0001

## 2011-04-18 NOTE — ED Provider Notes (Signed)
Medical screening examination/treatment/procedure(s) were conducted as a shared visit with non-physician practitioner(s) and myself.  I personally evaluated the patient during the encounter Patient in no distress w sym strength in UE.  We had a long discussion re: pain control, and the necessity for pain management services. D/C home in stable condition.  Carmin Muskrat, MD 04/18/11 0003

## 2011-04-19 ENCOUNTER — Ambulatory Visit (INDEPENDENT_AMBULATORY_CARE_PROVIDER_SITE_OTHER): Payer: Medicaid Other | Admitting: Internal Medicine

## 2011-04-19 ENCOUNTER — Encounter: Payer: Self-pay | Admitting: Internal Medicine

## 2011-04-19 ENCOUNTER — Telehealth: Payer: Self-pay | Admitting: Internal Medicine

## 2011-04-19 ENCOUNTER — Other Ambulatory Visit: Payer: Medicaid Other

## 2011-04-19 ENCOUNTER — Encounter: Payer: Medicaid Other | Admitting: Internal Medicine

## 2011-04-19 ENCOUNTER — Ambulatory Visit: Payer: Medicaid Other | Admitting: Internal Medicine

## 2011-04-19 DIAGNOSIS — G4733 Obstructive sleep apnea (adult) (pediatric): Secondary | ICD-10-CM

## 2011-04-19 DIAGNOSIS — G894 Chronic pain syndrome: Secondary | ICD-10-CM

## 2011-04-19 DIAGNOSIS — J449 Chronic obstructive pulmonary disease, unspecified: Secondary | ICD-10-CM

## 2011-04-19 DIAGNOSIS — M5412 Radiculopathy, cervical region: Secondary | ICD-10-CM

## 2011-04-19 DIAGNOSIS — R609 Edema, unspecified: Secondary | ICD-10-CM | POA: Insufficient documentation

## 2011-04-19 DIAGNOSIS — F418 Other specified anxiety disorders: Secondary | ICD-10-CM

## 2011-04-19 DIAGNOSIS — E119 Type 2 diabetes mellitus without complications: Secondary | ICD-10-CM

## 2011-04-19 DIAGNOSIS — E669 Obesity, unspecified: Secondary | ICD-10-CM | POA: Insufficient documentation

## 2011-04-19 DIAGNOSIS — E785 Hyperlipidemia, unspecified: Secondary | ICD-10-CM

## 2011-04-19 DIAGNOSIS — F17200 Nicotine dependence, unspecified, uncomplicated: Secondary | ICD-10-CM

## 2011-04-19 DIAGNOSIS — M109 Gout, unspecified: Secondary | ICD-10-CM

## 2011-04-19 DIAGNOSIS — I10 Essential (primary) hypertension: Secondary | ICD-10-CM

## 2011-04-19 DIAGNOSIS — Z79891 Long term (current) use of opiate analgesic: Secondary | ICD-10-CM | POA: Insufficient documentation

## 2011-04-19 DIAGNOSIS — F341 Dysthymic disorder: Secondary | ICD-10-CM

## 2011-04-19 HISTORY — DX: Chronic pain syndrome: G89.4

## 2011-04-19 HISTORY — DX: Edema, unspecified: R60.9

## 2011-04-19 HISTORY — DX: Other specified anxiety disorders: F41.8

## 2011-04-19 MED ORDER — GABAPENTIN 300 MG PO CAPS: ORAL_CAPSULE | ORAL | Status: DC

## 2011-04-19 MED ORDER — COLCHICINE 0.6 MG PO TABS: ORAL_TABLET | ORAL | Status: DC

## 2011-04-19 MED ORDER — OXYCODONE-ACETAMINOPHEN 5-325 MG PO TABS: 1.0000 | ORAL_TABLET | ORAL | Status: DC | PRN

## 2011-04-19 MED ORDER — TRAMADOL HCL 50 MG PO TABS: 50.0000 mg | ORAL_TABLET | Freq: Four times a day (QID) | ORAL | Status: DC | PRN

## 2011-04-19 NOTE — Assessment & Plan Note (Addendum)
Please see A&P under chronic pain syndrome.

## 2011-04-19 NOTE — Progress Notes (Signed)
  Subjective:    Patient ID: Andrew Fowler, male    DOB: 05-Nov-1960, 51 y.o.   MRN: [id number]  HPI  Please see the A&P for the status of the pt's chronic medical problems.   Review of Systems  Constitutional: Positive for fatigue.  Respiratory: Negative for shortness of breath.   Cardiovascular: Positive for leg swelling.  Musculoskeletal: Positive for myalgias, back pain, joint swelling, arthralgias and gait problem.  Psychiatric/Behavioral: Positive for sleep disturbance.       Objective:   Physical Exam  Constitutional: He appears well-developed and well-nourished. No distress.  HENT:  Head: Normocephalic and atraumatic.  Right Ear: External ear normal.  Left Ear: External ear normal.  Musculoskeletal: He exhibits edema and tenderness.       Decreased ROM passive and active of hands, wrist B. Non-pitting edema of hands. 1+ pitting edema of L leg. Trace on R leg. Some mild erythema of hands B. Tobacco staining of R fingers. In [facility] so cannot assess gait. Decreased and painful ROM of legs B.   Skin: Skin is warm and dry. Rash noted. He is not diaphoretic.  Psychiatric: He has a normal mood and affect. His behavior is normal. Judgment normal.          Assessment & Plan:

## 2011-04-19 NOTE — Assessment & Plan Note (Signed)
He is at high risk for a CM. He has untreated OSA, HTN, HLD, DM, tobacco use and obesity. He states that he has had an ECHO but I cannot find in the EMR. He now some edema of hands (likely unrelated) and legs L>R. Will proceed with ECHO to R/O CM. Also check TSH and UA for protein. Has had CMP and CBC recently so do not need to recheck. Albumin was a little low.

## 2011-04-19 NOTE — Patient Instructions (Signed)
The gabapentin is for the nerve pain. Take it every day. The ultram is for pain flares. Take it as needed The oxycodone is for the pain with the MRI. The ECHO is to look at your call. Call 1800QUITNOW for help stopping smoking The colchicine is to try to decrease the gout. One day only is needed.

## 2011-04-19 NOTE — Assessment & Plan Note (Signed)
I discussed his benzo use. He uses it for anxiety - gets agitated by people, things, and situations. Describes. Usually takes in in AM, one a few hours later, and one at bedtime. Occassioanlly may be able to skip a dose. Has been able to go a day or two without any but not recently. Uses the SSRI also.  Will need to get contract at next visit for benzo.

## 2011-04-19 NOTE — Assessment & Plan Note (Signed)
His A1 C is well controlled. Need to address further next visit.

## 2011-04-19 NOTE — Telephone Encounter (Signed)
Called around 2:30 pm 04/19/11, as [facility] refused to fill prescription as there were not appropriate sig on prescription. I will resend the prescription with sigs.

## 2011-04-19 NOTE — Assessment & Plan Note (Addendum)
Pt states that he used to have freq attacks but not anymore. He has never been on allopurinol nor colchicine. He has had flares in his ankles and great toe. He has never had a crystal diagnosis. He states that his hands have been swollen and painful for three weeks. They reached max pain rapidly and stayed at max pain since. He has assoc swelling and very mild erythema. He has sig reduced motion of hands. They are tender to touch and movement. This could be gout although it would be nice to get a crystal diagnosis at some point. It is strange that the gout flared while on pretty high dosage of NSAID. Will try colchicine - acute tx dosage only. Will get uric acid level - this has been going on for 3 weeks so technically not acute.

## 2011-04-19 NOTE — Assessment & Plan Note (Signed)
I told Andrew Fowler that if he and Dr Sharol Given even entertain the idea of a surgical intervention for his neck that he needs to be an ex-smoker at least 4 weeks prior to the surgery. I explained that he is at very high risk of complications and the he could D/C the smoking to decrease his risk. I gave him the 1800QUITNOW number.

## 2011-04-19 NOTE — Assessment & Plan Note (Deleted)
His most recent BMI was 45.6. I can refer to Christene Slates at next visit.

## 2011-04-19 NOTE — Assessment & Plan Note (Signed)
Per pt, he has had PFT's. I was unable to locate in EMR. If he does have COPD, I will need to improve med regimen as he is on albuterol alone.

## 2011-04-19 NOTE — Assessment & Plan Note (Addendum)
BP better today. I am checking a UA to see if gross proteinuria as cause of edema.  BP Readings from Last 3 Encounters:  04/19/11 130/86  04/17/11 125/93  04/15/11 144/89

## 2011-04-19 NOTE — Assessment & Plan Note (Addendum)
I discussed that he really needs to use CPAP every night as it can cause severe if he doesn't. Will need to delve further why he is not compliant. Ordered CPAP to assess R ventricle as has new edema.

## 2011-04-19 NOTE — Assessment & Plan Note (Signed)
His most recent BMI was 45.6. I can refer to Christene Slates at next visit.

## 2011-04-20 LAB — URINALYSIS, ROUTINE W REFLEX MICROSCOPIC
Glucose, UA: NEGATIVE mg/dL
Leukocytes, UA: NEGATIVE
Protein, ur: NEGATIVE mg/dL
Specific Gravity, Urine: 1.026 (ref 1.005–1.030)
Urobilinogen, UA: 1 mg/dL (ref 0.0–1.0)
pH: 7 (ref 5.0–8.0)

## 2011-04-23 ENCOUNTER — Encounter: Payer: Self-pay | Admitting: Internal Medicine

## 2011-04-23 ENCOUNTER — Ambulatory Visit (INDEPENDENT_AMBULATORY_CARE_PROVIDER_SITE_OTHER): Payer: Medicaid Other | Admitting: Internal Medicine

## 2011-04-23 VITALS — BP 124/91 | HR 115 | Temp 96.9°F | Ht 70.5 in

## 2011-04-23 DIAGNOSIS — M2550 Pain in unspecified joint: Secondary | ICD-10-CM

## 2011-04-23 DIAGNOSIS — G894 Chronic pain syndrome: Secondary | ICD-10-CM

## 2011-04-23 DIAGNOSIS — F341 Dysthymic disorder: Secondary | ICD-10-CM

## 2011-04-23 DIAGNOSIS — F418 Other specified anxiety disorders: Secondary | ICD-10-CM

## 2011-04-23 DIAGNOSIS — F17200 Nicotine dependence, unspecified, uncomplicated: Secondary | ICD-10-CM

## 2011-04-23 DIAGNOSIS — F419 Anxiety disorder, unspecified: Secondary | ICD-10-CM

## 2011-04-23 DIAGNOSIS — M5412 Radiculopathy, cervical region: Secondary | ICD-10-CM

## 2011-04-23 DIAGNOSIS — I10 Essential (primary) hypertension: Secondary | ICD-10-CM

## 2011-04-23 DIAGNOSIS — R609 Edema, unspecified: Secondary | ICD-10-CM

## 2011-04-23 DIAGNOSIS — M109 Gout, unspecified: Secondary | ICD-10-CM

## 2011-04-23 LAB — GLUCOSE, CAPILLARY: Glucose-Capillary: 110 mg/dL — ABNORMAL HIGH (ref 70–99)

## 2011-04-23 MED ORDER — HYDROCODONE-ACETAMINOPHEN 5-500 MG PO TABS: 2.0000 | ORAL_TABLET | Freq: Four times a day (QID) | ORAL | Status: DC | PRN

## 2011-04-23 MED ORDER — GABAPENTIN 300 MG PO CAPS: 600.0000 mg | ORAL_CAPSULE | Freq: Three times a day (TID) | ORAL | Status: DC

## 2011-04-23 NOTE — Assessment & Plan Note (Signed)
I need to research his chronic pain syndrome further. We focused on his neck / shoulder / arm pain today.  This has been present since 2011 at least. He had an MRI that showed multi-level DDD and L foraminal stenosis at C4-5 and C5-6. He states that Dr Sharol Given wanted to do ClTS release at that time. Had a steroid shot at some point that helped some. I need to get old records and review.   Pain has been persistent since then, maybe worse recently. He was referred back to Dr Sharol Given who wanted to get a repeat MRI but pt tried and was unable to lie on the hard flat surface 2/2 pain. Pain in lower neck, B shoulders, B biceps, and sometimes to elbows and hands B. It interferes with his sleep. He got some oxycodone 5 mg from the ER recently and was requesting same from me. I am hesitant to start him on chronic opioids. He says he gets no relief from the NSAID.   I will rx gabapentin for neuropathic pain. I also Rx tramadol for flares. He knows that I cannot take away all of his pain just reduce it to tolerable. I am also treating his possible gout with colchicine that might help his hands. I also re-ordered the cervical MRI and gave oxycodone to use the day before and day of so that he can tolerate the MRI procedure. I may end up putting him on opioids in the end as I do not know what Dr Sharol Given will plan.

## 2011-04-23 NOTE — Progress Notes (Signed)
Subjective:     Patient ID: Andrew Fowler, male   DOB: 10-29-1960, 51 y.o.   MRN: [id number]  HPI Patient is a 51 year old man with extensive medical problems including diabetes, hypertension, chronic pain syndrome, OSA, cervical neuropathic pain, and anxiety with depression who is here for followup.  Patient was seen 4 days ago on Friday by Dr. Lynnae January for continued pain of the bilateral hands, neck, and bilateral shoulders and clavicles. 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 On Friday, he was given a course of colchicine for possible gout of the hands. He says the colchicine may have helped, but he has a hard time remembering. He says tramadol he was given did not help. He says gabapentin he was given maybe helped a little bit. His pain is worst in the bilateral hands, with inability to fully flex his fingers to close this, and pain at MCP, PIP, and DIP.  Please see individual problems or further history, assessments, and plans  Review of Systems As per history of present illness    Objective:   Physical Exam GEN: NAD.  Alert and oriented x 3.  Pleasant, conversant, and cooperative to exam.  In wheelchair Hands: No erythema or warmth, no atrophy, but diffuse TTP of the b/l hands.  Difficulty with finger flexion, decreased grip strength diffusely.  Possibly mild non-pitting edema, but I have never examined his hands before. Arms: inability to raise arms above head.  TTP at neck. EXT: warm and dry. Trace pitting edema in R leg worse than L     Assessment:         Plan:

## 2011-04-23 NOTE — Assessment & Plan Note (Signed)
BP continues to be acceptable. No protein in urine.

## 2011-04-23 NOTE — Assessment & Plan Note (Signed)
Uric acid came back as normal. Colchicine was equivocal in its efficacy of treatment. He does not have overtly warm or swollen joints today. He does not have a crystal diagnosis of a history of gout, and it appears that this pain on Friday was possibly not gout. We did not start him on a prophylactic gout medicine.

## 2011-04-23 NOTE — Assessment & Plan Note (Signed)
Patient continues to smoke, and knows the risks of continuing to smoke. Regarding his preoperative risk, that is jumping down a little bit given that we don't know what his operation be or how good of an overall surgical candidate he is. Nonetheless, we encouraged him to consider quitting smoking.

## 2011-04-23 NOTE — Assessment & Plan Note (Addendum)
Patient is scheduled for an echo, as he has high-risk for cardiomyopathy given OSA, HTN, HLD, DM, tobacco, obesity. TSH and UA were unremarkable. - Followup echo

## 2011-04-23 NOTE — Assessment & Plan Note (Addendum)
Patient continues to use Xanax up to 3 times daily. He signed a pain contract today or Xanax. However, I did not give him any more prescriptions today because he still had a refill to go. He said he had not taken Xanax in a couple of days, so that it may not be in his urine. I said that for this baseline test that would be okay. - UDS today for baseline - Pain contract has been signed

## 2011-04-23 NOTE — Assessment & Plan Note (Addendum)
This is the patient's primary concern today. He does continue to have considerable bilateral hand pain as well as pain in the neck and bilateral shoulders. He has an MRI scheduled for Monday, and will followup with his orthopedist Dr.Duda regarding those results. Tramadol provided him no relief, and it appears that cultures seen may not have helped either. He reports some relief with gabapentin, so we will uptitrate from [phone] daily. He asked for a narcotic to help him until his MRI, and after discussion with Dr. Lynnae January, we agreed that it would be okay. We gave him Vicodin to last him until his MRI. He also has a prescription for 10 pills of Percocet for the day of the MRI, but he says the Percocet upsets his stomach and he would prefer vicodin anyway.  As outlined in the overview, it is difficult to say if he has a chronic pain syndrome, cervical neck pathology, or bilateral carpal tunnel syndromes (the patient says he has been diagnosed formally with carpal tunnel). He is a very difficult case, and he does come off as reasonable and appropriate. I do not think he is "seeking" pain medicines in the classic sense, but he has a hard time managing his pain. Depending on what his MRI shows and what Dr. Sharol Given wants to do, he may end up on chronic opioids in the end. For now, he can use his Xanax and a few Vicodin until his MRI.  He does complain that his pain is worse in the morning, with complaints of shoulder stiffness, so we will get a few basic labs to evaluate rheumatic pathology. - Increase gabapentin to 600 3 times a day - Vicodin to bridge through MRI in 6 days from now - Followup with Dr. Sharol Given for MRI results - UDS obtained for baseline for Xanax - ESR - CRP - Anti-CCP

## 2011-04-24 LAB — PRESCRIPTION ABUSE MONITORING 15P, URINE
Amphetamine/Meth: NEGATIVE ng/mL
Barbiturate Screen, Urine: NEGATIVE ng/mL
Buprenorphine, Urine: NEGATIVE ng/mL
Carisoprodol, Urine: NEGATIVE ng/mL
Cocaine Metabolites: NEGATIVE ng/mL
Fentanyl, Ur: POSITIVE ng/mL — ABNORMAL HIGH
Meperidine, Ur: POSITIVE ng/mL — ABNORMAL HIGH
Oxycodone Screen, Ur: POSITIVE ng/mL — ABNORMAL HIGH
Propoxyphene: NEGATIVE ng/mL

## 2011-04-24 LAB — SEDIMENTATION RATE: Sed Rate: 76 mm/hr — ABNORMAL HIGH (ref 0–16)

## 2011-04-24 LAB — CYCLIC CITRUL PEPTIDE ANTIBODY, IGG: Cyclic Citrullin Peptide Ab: 7.8 U/mL — ABNORMAL HIGH (ref 0.0–5.0)

## 2011-04-24 LAB — C-REACTIVE PROTEIN: CRP: 14.31 mg/dL — ABNORMAL HIGH (ref <0.60)

## 2011-04-24 NOTE — Progress Notes (Signed)
agree

## 2011-04-26 LAB — BENZODIAZEPINES (GC/LC/MS), URINE
Alprazolam metabolite (GC/LC/MS), ur confirm: NEGATIVE NG/ML
Diazepam (GC/LC/MS), ur confirm: NEGATIVE NG/ML
Estazolam (GC/LC/MS), ur confirm: NEGATIVE NG/ML
Flunitrazepam metabolite (GC/LC/MS), ur confirm: NEGATIVE NG/ML
Midazolam (GC/LC/MS), ur confirm: NEGATIVE NG/ML
Oxazepam (GC/LC/MS), ur confirm: 691 NG/ML — ABNORMAL HIGH

## 2011-04-26 LAB — OPIATES/OPIOIDS (LC/MS-MS)
Codeine Urine: NEGATIVE NG/ML
Hydrocodone: NEGATIVE NG/ML
Hydromorphone: NEGATIVE NG/ML
Oxycodone, ur: 5370 NG/ML — ABNORMAL HIGH
Oxymorphone: NEGATIVE NG/ML

## 2011-04-26 LAB — MEPERIDINE (GC/LC/MS), URINE: Normeperidine (GC/LC/MS), ur confirm: NEGATIVE NG/ML

## 2011-04-26 LAB — FENTANYL (GC/LC/MS), URINE
Fentanyl, confirm: NEGATIVE NG/ML
Norfentanyl, confirm: NEGATIVE NG/ML

## 2011-04-26 NOTE — ED Provider Notes (Signed)
History    50yM with shoulder and neck pain. Chronic. Previous evaluations by orthopedics for same. Denies acute trauma. No acute focal weakness, numbness or tingling. No fever or chills. No rash. Hands feel swollen.   CSN: [id number]  Arrival date & time 04/15/11  0277   First MD Initiated Contact with Patient 04/15/11 [phone]      Chief Complaint  Patient presents with  . Pain    musculoskeletal     (Consider location/radiation/quality/duration/timing/severity/associated sxs/prior treatment) HPI  Past Medical History  Diagnosis Date  . COPD (chronic obstructive pulmonary disease)     Per pt, he has a diagnosis of COPD. Need to locate PFT's.  . Hypertension     ACEI monotherapy  . Hyperlipidemia     At goal of LDL <100 with statin.  . Obesity, Class III, BMI 40-49.9 (morbid obesity)   . Tobacco abuse     Per pt, he has a diagnosis of COPD. Need to locate PFT's.  . Gout     Pt has never had a crystal diagnosis.  . Diabetes mellitus     Non-insulin dependent Type II.  Marland Kitchen Obstructive sleep apnea 08/21/08    Sleep study AHI 16.4 with desat to 66%. CPAP of 17 decreased AHI to 0.9. Non-compliant with CPAP.  Marland Kitchen Anxiety associated with depression 04/19/2011    On chronic benzos and SSRI's. Gets panic attacks. Does not see mental health.  . Chronic pain syndrome     s/p L4-L5 laminectomy. Lumbar MRI 4/11 : L3-L4 mod central canal narrowing.  Cervical MRI 6/11 : multi-level DDD and L foraminal stenosis at C4-5 and C5-6.    Past Surgical History  Procedure Date  . Laminectomy 1995    L4-L5  . Joint replacement 2009    left hip replacement    Family History  Problem Relation Age of Onset  . Diabetes Mother   . Heart attack Mother   . Heart disease Mother   . Diabetes Father   . Kidney disease Father     History  Substance Use Topics  . Smoking status: Current Everyday Smoker -- 1.0 packs/day    Types: Cigarettes  . Smokeless tobacco: Not on file  . Alcohol Use: 0.0  oz/week     weekends      Review of Systems   Review of symptoms negative unless otherwise noted in HPI.   Allergies  Review of patient's allergies indicates no known allergies.  Home Medications   Current Outpatient Rx  Name Route Sig Dispense Refill  . ALBUTEROL SULFATE HFA 108 (90 BASE) MCG/ACT IN AERS Inhalation Inhale 1-2 puffs into the lungs every 4 (four) hours as needed. shortness of breath    . ALPRAZOLAM 0.5 MG PO TABS Oral Take 0.5 mg by mouth 3 (three) times daily. For anxiety    . ASPIRIN 81 MG PO TBEC Oral Take 81 mg by mouth daily.      Marland Kitchen FLUOXETINE HCL 20 MG PO CAPS Oral Take 1 capsule (20 mg total) by mouth daily. 90 capsule 3  . IBUPROFEN 200 MG PO TABS Oral Take 800-1,200 mg by mouth 6 (six) times daily. For pain    . LISINOPRIL 10 MG PO TABS Oral Take 1 tablet (10 mg total) by mouth daily. 90 tablet 3  . METFORMIN HCL 500 MG PO TABS Oral Take 1 tablet (500 mg total) by mouth 2 (two) times daily with a meal. 180 tablet 3  . SIMVASTATIN 40 MG PO TABS Oral Take  1 tablet (40 mg total) by mouth at bedtime. 90 tablet 1  . COLCHICINE 0.6 MG PO TABS  Take two pills now then take one pill one hour later. 3 tablet 0  . GABAPENTIN 300 MG PO CAPS Oral Take 2 capsules (600 mg total) by mouth 3 (three) times daily. Take one pill today, then one pill twice a day on Sat, then three pills three times a day every day after that. 180 capsule 2    1 cap on 1/25, 1 cap twice daily on 1/26 and then  ...  . HYDROCODONE-ACETAMINOPHEN 5-500 MG PO TABS Oral Take 2 tablets by mouth every 6 (six) hours as needed for pain. 42 tablet 0  . TRAMADOL HCL 50 MG PO TABS Oral Take 1 tablet (50 mg total) by mouth every 6 (six) hours as needed for pain. 20 tablet 0    BP 144/89  Pulse 108  Temp(Src) 97.7 F (36.5 C) (Oral)  Resp 22  SpO2 97%  Physical Exam  Nursing note and vitals reviewed. Constitutional: He appears well-developed and well-nourished. No distress.       Sitting up in bed.  Nad. Obese.  HENT:  Head: Normocephalic and atraumatic.  Eyes: Conjunctivae are normal. Right eye exhibits no discharge. Left eye exhibits no discharge.  Neck: Neck supple.  Cardiovascular: Normal rate, regular rhythm and normal heart sounds.  Exam reveals no gallop and no friction rub.   No murmur heard. Pulmonary/Chest: Effort normal and breath sounds normal. No respiratory distress.  Musculoskeletal: He exhibits no edema and no tenderness.       Diffuse tenderness across upper back, shoulders and neck. No overlying skin lesions. FROM at shoulders, elbows and wrists but with increased pain with ROM of shoulder. Neurovascularly intact distally. Strength 5/5 b/l upper extrem.  Neurological: He is alert. He exhibits normal muscle tone.  Skin: Skin is warm and dry.  Psychiatric: He has a normal mood and affect. His behavior is normal. Thought content normal.    ED Course  Procedures (including critical care time)  Labs Reviewed - No data to display No results found.   1. Upper arm pain   2. Neck pain       MDM  50yM with chronic neck and upper back pain. No acute trauma. Nonfocal neurological examination. Afebrile and NAD. Suspect imaging low yield. Plan symptomatic tx and outpt fu.        Virgel Manifold, MD 04/26/11 [phone]

## 2011-04-29 ENCOUNTER — Ambulatory Visit
Admission: RE | Admit: 2011-04-29 | Discharge: 2011-04-29 | Disposition: A | Discharge status: Home / Self Care | Payer: Medicaid Other | Source: Ambulatory Visit | Attending: Orthopedic Surgery | Admitting: Orthopedic Surgery

## 2011-05-01 ENCOUNTER — Ambulatory Visit (HOSPITAL_COMMUNITY)
Admission: RE | Admit: 2011-05-01 | Discharge: 2011-05-01 | Disposition: A | Discharge status: Home / Self Care | Payer: Medicaid Other | Source: Ambulatory Visit | Attending: Internal Medicine | Admitting: Internal Medicine

## 2011-05-01 ENCOUNTER — Emergency Department (HOSPITAL_COMMUNITY)
Admission: EM | Admit: 2011-05-01 | Discharge: 2011-05-01 | Disposition: A | Discharge status: Home / Self Care | Payer: Medicaid Other | Attending: Emergency Medicine | Admitting: Emergency Medicine

## 2011-05-01 ENCOUNTER — Encounter (HOSPITAL_COMMUNITY): Payer: Self-pay | Admitting: *Deleted

## 2011-05-01 DIAGNOSIS — M109 Gout, unspecified: Secondary | ICD-10-CM | POA: Insufficient documentation

## 2011-05-01 DIAGNOSIS — M7989 Other specified soft tissue disorders: Secondary | ICD-10-CM | POA: Insufficient documentation

## 2011-05-01 DIAGNOSIS — F17200 Nicotine dependence, unspecified, uncomplicated: Secondary | ICD-10-CM | POA: Insufficient documentation

## 2011-05-01 DIAGNOSIS — J449 Chronic obstructive pulmonary disease, unspecified: Secondary | ICD-10-CM | POA: Insufficient documentation

## 2011-05-01 DIAGNOSIS — J4489 Other specified chronic obstructive pulmonary disease: Secondary | ICD-10-CM | POA: Insufficient documentation

## 2011-05-01 DIAGNOSIS — E785 Hyperlipidemia, unspecified: Secondary | ICD-10-CM | POA: Insufficient documentation

## 2011-05-01 DIAGNOSIS — M25559 Pain in unspecified hip: Secondary | ICD-10-CM | POA: Insufficient documentation

## 2011-05-01 DIAGNOSIS — R609 Edema, unspecified: Secondary | ICD-10-CM

## 2011-05-01 DIAGNOSIS — G4733 Obstructive sleep apnea (adult) (pediatric): Secondary | ICD-10-CM | POA: Insufficient documentation

## 2011-05-01 DIAGNOSIS — M25569 Pain in unspecified knee: Secondary | ICD-10-CM | POA: Insufficient documentation

## 2011-05-01 DIAGNOSIS — I10 Essential (primary) hypertension: Secondary | ICD-10-CM | POA: Insufficient documentation

## 2011-05-01 DIAGNOSIS — E119 Type 2 diabetes mellitus without complications: Secondary | ICD-10-CM | POA: Insufficient documentation

## 2011-05-01 DIAGNOSIS — G8929 Other chronic pain: Secondary | ICD-10-CM | POA: Insufficient documentation

## 2011-05-01 DIAGNOSIS — M79609 Pain in unspecified limb: Secondary | ICD-10-CM | POA: Insufficient documentation

## 2011-05-01 DIAGNOSIS — M2550 Pain in unspecified joint: Secondary | ICD-10-CM

## 2011-05-01 DIAGNOSIS — M25519 Pain in unspecified shoulder: Secondary | ICD-10-CM | POA: Insufficient documentation

## 2011-05-01 MED ORDER — HYDROCODONE-ACETAMINOPHEN 5-325 MG PO TABS: 2.0000 | ORAL_TABLET | Freq: Once | ORAL | Status: DC

## 2011-05-01 MED ORDER — HYDROCODONE-ACETAMINOPHEN 5-325 MG PO TABS
2.0000 | ORAL_TABLET | Freq: Once | ORAL | Status: AC
  Administered 2011-05-01: 2 via ORAL
  Filled 2011-05-01: qty 2

## 2011-05-01 NOTE — ED Notes (Signed)
Pt is [facility] at 12:45. [phone]

## 2011-05-01 NOTE — ED Provider Notes (Signed)
History     CSN: [id number]  Arrival date & time 05/01/11  0811   First MD Initiated Contact with Patient 05/01/11 0848      Chief Complaint  Patient presents with  . Muscle Pain    (Consider location/radiation/quality/duration/timing/severity/associated sxs/prior treatment) Patient is a 51 y.o. male presenting with leg pain. The history is provided by the patient.  Leg Pain  Incident onset: The patient has chronic pain in joints, usually under control with Vicodin. He reports increasing pain this morning after running out of pain medication. The pain is present in the left hip, left knee, right hip and right knee. Pertinent negatives include no muscle weakness and no tingling. Associated symptoms comments: He has pain limited to knee and hip joints bilaterally. No new injury, no swelling, redness or fever. He also complains today of persistent bilateral shoulder pain and upper extremity/hand swelling. He has been seen and evaluated for this by his primary care provider at [facility] and an outpatient echo has been arranged for later today.Marland Kitchen He has tried NSAIDs for the symptoms.    Past Medical History  Diagnosis Date  . COPD (chronic obstructive pulmonary disease)     Per pt, he has a diagnosis of COPD. Need to locate PFT's.  . Hypertension     ACEI monotherapy  . Hyperlipidemia     At goal of LDL <100 with statin.  . Obesity, Class III, BMI 40-49.9 (morbid obesity)   . Tobacco abuse     Per pt, he has a diagnosis of COPD. Need to locate PFT's.  . Gout     Pt has never had a crystal diagnosis.  . Diabetes mellitus     Non-insulin dependent Type II.  Marland Kitchen Obstructive sleep apnea 08/21/08    Sleep study AHI 16.4 with desat to 66%. CPAP of 17 decreased AHI to 0.9. Non-compliant with CPAP.  Marland Kitchen Anxiety associated with depression 04/19/2011    On chronic benzos and SSRI's. Gets panic attacks. Does not see mental health.  . Chronic pain syndrome     s/p L4-L5 laminectomy. Lumbar MRI  4/11 : L3-L4 mod central canal narrowing.  Cervical MRI 6/11 : multi-level DDD and L foraminal stenosis at C4-5 and C5-6.    Past Surgical History  Procedure Date  . Laminectomy 1995    L4-L5  . Joint replacement 2009    left hip replacement    Family History  Problem Relation Age of Onset  . Diabetes Mother   . Heart attack Mother   . Heart disease Mother   . Diabetes Father   . Kidney disease Father     History  Substance Use Topics  . Smoking status: Current Everyday Smoker -- 1.0 packs/day    Types: Cigarettes  . Smokeless tobacco: Not on file  . Alcohol Use: 0.0 oz/week     weekends      Review of Systems  Constitutional: Negative for fever and chills.  HENT: Negative.   Respiratory: Negative.   Cardiovascular: Negative.   Gastrointestinal: Negative.   Musculoskeletal: Negative.        See HPI  Skin: Negative.   Neurological: Negative.  Negative for tingling.    Allergies  Review of patient's allergies indicates no known allergies.  Home Medications   Current Outpatient Rx  Name Route Sig Dispense Refill  . ALBUTEROL SULFATE HFA 108 (90 BASE) MCG/ACT IN AERS Inhalation Inhale 1-2 puffs into the lungs every 4 (four) hours as needed. shortness of breath    .  ALPRAZOLAM 0.5 MG PO TABS Oral Take 0.5 mg by mouth 3 (three) times daily. For anxiety    . ASPIRIN 81 MG PO TBEC Oral Take 81 mg by mouth daily.      Marland Kitchen FLUOXETINE HCL 20 MG PO CAPS Oral Take 20 mg by mouth daily.    Marland Kitchen GABAPENTIN 300 MG PO CAPS Oral Take 300 mg by mouth 3 (three) times daily.    Marland Kitchen HYDROCODONE-ACETAMINOPHEN 5-500 MG PO TABS Oral Take 2 tablets by mouth every 6 (six) hours as needed. For pain    . IBUPROFEN 200 MG PO TABS Oral Take 800-1,200 mg by mouth 6 (six) times daily. For pain    . LISINOPRIL 10 MG PO TABS Oral Take 10 mg by mouth daily.    Marland Kitchen METFORMIN HCL 500 MG PO TABS Oral Take 500 mg by mouth 2 (two) times daily with a meal.    . SIMVASTATIN 40 MG PO TABS Oral Take 40 mg by  mouth at bedtime.      BP 139/94  Pulse 105  Temp(Src) 97.7 F (36.5 C) (Oral)  Resp 18  SpO2 96%  Physical Exam  Constitutional: He appears well-developed and well-nourished.  HENT:  Head: Normocephalic.  Neck: Normal range of motion. Neck supple.  Cardiovascular: Normal rate and regular rhythm.   Pulmonary/Chest: Effort normal and breath sounds normal.  Abdominal: Soft. Bowel sounds are normal. There is no tenderness. There is no rebound and no guarding.  Musculoskeletal: Normal range of motion.       Minimal swelling without pitting in hands bilaterally. No redness, warmth. FROM all joints, including lower extremities. Pulses equal bilaterally upper and lower extremities.  Neurological: He is alert. No cranial nerve deficit.  Skin: Skin is warm and dry. No rash noted.  Psychiatric: He has a normal mood and affect.    ED Course  Procedures (including critical care time)  Labs Reviewed - No data to display Mr Cervical Spine Wo Contrast  04/29/2011  *RADIOLOGY REPORT*  Clinical Data: Chronic neck pain.  Bilateral extremity pain.  MRI CERVICAL SPINE WITHOUT CONTRAST  Technique:  Multiplanar and multiecho pulse sequences of the cervical spine, to include the craniocervical junction and cervicothoracic junction, were obtained according to standard protocol without intravenous contrast.  Comparison: MR cervical spine 08/17/2009.  Findings: Again seen is straightening of the normal cervical lordosis.  Vertebral body height, signal and alignment are maintained.  The craniocervical junction is normal and cervical cord signal is normal.  C2-3:  Minimal disc bulge without central canal or foraminal narrowing.  C3-4:  Mild disc bulge without central canal or foraminal narrowing.  C4-5:  Disc bulge eccentric left and uncovertebral disease on the left appear unchanged.  Central canal remains open.  Left foraminal narrowing is stable in appearance.  Right foramen is open.  C5-6:  There is some disc  bulging eccentric to the left and uncovertebral disease on the left.  Ventral thecal sac is effaced. Left foraminal narrowing is unchanged.  Right foramen open.  The appearance of this level is stable.  C6-7:  Mild disc bulge without central canal or foraminal narrowing.  C7-T1:  No change in a tiny central protrusion.  Central canal and foramina are open.  IMPRESSION: Unchanged examination of the cervical spine as detailed above.  As on the prior study, most notable finding is left foraminal narrowing at C4-5 and C5-6.  Original Report Authenticated By: Arvid Right. Luther Parody, M.D.     No diagnosis found.  MDM  Chronic joint pain with normal exam coincidental to patient's running out of pain medications. Feel no further work up required. He arrives to ED via EMS and relies on public transportation to go to appointments. Will place in CDU until appointment time for ECHO, then discharge from ED.        Leotis Shames, PA-C 05/01/11 1010

## 2011-05-01 NOTE — ED Notes (Signed)
Per EMS- pt has had gout pain for approx 6 weeks. Pt was unable to get out of bed this am due to pain. Pt has had incontinence due to pain from gout.

## 2011-05-01 NOTE — Progress Notes (Signed)
*  PRELIMINARY RESULTS* Echocardiogram 2D Echocardiogram has been performed.  Leavy Cella R 05/01/2011, 1:53 PM

## 2011-05-01 NOTE — ED Notes (Signed)
Pt placed on bedpan

## 2011-05-01 NOTE — ED Notes (Signed)
Patient requested to get back into the stretcher.  Patient tolerated transfer with minimal assistance to standup lay in bed.

## 2011-05-01 NOTE — ED Notes (Signed)
Pt to cdu awaiting d/c at 1245. Pt to be taken for out patient echo at 1pm. Diet tray ordered for pt.

## 2011-05-01 NOTE — ED Provider Notes (Signed)
Medical screening examination/treatment/procedure(s) were performed by non-physician practitioner and as supervising physician I was immediately available for consultation/collaboration.  Richarda Blade, MD 05/01/11 [phone]

## 2011-05-01 NOTE — ED Notes (Signed)
Patient transferred to recliner chair per his request. Patient tolerated movement without incident. Given [place] sandwich and orange juice per PA order.

## 2011-05-06 ENCOUNTER — Emergency Department (HOSPITAL_COMMUNITY): Payer: Medicaid Other

## 2011-05-06 ENCOUNTER — Telehealth: Payer: Self-pay | Admitting: *Deleted

## 2011-05-06 ENCOUNTER — Inpatient Hospital Stay (HOSPITAL_COMMUNITY)
Admission: EM | Admit: 2011-05-06 | Discharge: 2011-05-10 | DRG: 555 | Disposition: A | Discharge status: Skilled Nursing Facility | Payer: Medicaid Other | Attending: Internal Medicine | Admitting: Internal Medicine

## 2011-05-06 ENCOUNTER — Other Ambulatory Visit: Payer: Self-pay

## 2011-05-06 ENCOUNTER — Encounter (HOSPITAL_COMMUNITY): Payer: Self-pay | Admitting: Emergency Medicine

## 2011-05-06 DIAGNOSIS — J189 Pneumonia, unspecified organism: Secondary | ICD-10-CM

## 2011-05-06 DIAGNOSIS — E66813 Obesity, class 3: Secondary | ICD-10-CM

## 2011-05-06 DIAGNOSIS — F17200 Nicotine dependence, unspecified, uncomplicated: Secondary | ICD-10-CM | POA: Diagnosis present

## 2011-05-06 DIAGNOSIS — I10 Essential (primary) hypertension: Secondary | ICD-10-CM | POA: Insufficient documentation

## 2011-05-06 DIAGNOSIS — G894 Chronic pain syndrome: Secondary | ICD-10-CM

## 2011-05-06 DIAGNOSIS — E1169 Type 2 diabetes mellitus with other specified complication: Secondary | ICD-10-CM | POA: Insufficient documentation

## 2011-05-06 DIAGNOSIS — R627 Adult failure to thrive: Secondary | ICD-10-CM

## 2011-05-06 DIAGNOSIS — E119 Type 2 diabetes mellitus without complications: Secondary | ICD-10-CM

## 2011-05-06 DIAGNOSIS — Z79891 Long term (current) use of opiate analgesic: Secondary | ICD-10-CM | POA: Insufficient documentation

## 2011-05-06 DIAGNOSIS — J45909 Unspecified asthma, uncomplicated: Secondary | ICD-10-CM | POA: Diagnosis present

## 2011-05-06 DIAGNOSIS — M2550 Pain in unspecified joint: Principal | ICD-10-CM

## 2011-05-06 DIAGNOSIS — Z720 Tobacco use: Secondary | ICD-10-CM | POA: Insufficient documentation

## 2011-05-06 DIAGNOSIS — F418 Other specified anxiety disorders: Secondary | ICD-10-CM | POA: Insufficient documentation

## 2011-05-06 LAB — URINALYSIS, ROUTINE W REFLEX MICROSCOPIC
Bilirubin Urine: NEGATIVE
Hgb urine dipstick: NEGATIVE
Ketones, ur: NEGATIVE mg/dL
Protein, ur: NEGATIVE mg/dL
Urobilinogen, UA: 1 mg/dL (ref 0.0–1.0)

## 2011-05-06 LAB — BASIC METABOLIC PANEL
Calcium: 10.7 mg/dL — ABNORMAL HIGH (ref 8.4–10.5)
GFR calc non Af Amer: >90 mL/min (ref >90)
Glucose, Bld: 79 mg/dL (ref 70–99)
Sodium: 132 mEq/L — ABNORMAL LOW (ref 135–145)

## 2011-05-06 LAB — POCT I-STAT TROPONIN I: Troponin i, poc: 0 ng/mL (ref 0.00–0.08)

## 2011-05-06 LAB — CBC
MCH: 29.2 pg (ref 26.0–34.0)
MCV: 89.5 fL (ref 78.0–100.0)
Platelets: 451 10*3/uL — ABNORMAL HIGH (ref 150–400)
RDW: 14.3 % (ref 11.5–15.5)

## 2011-05-06 LAB — DIFFERENTIAL
Eosinophils Absolute: 0.4 10*3/uL (ref 0.0–0.7)
Eosinophils Relative: 3 % (ref 0–5)
Lymphs Abs: 1.4 10*3/uL (ref 0.7–4.0)

## 2011-05-06 LAB — GLUCOSE, CAPILLARY: Glucose-Capillary: 92 mg/dL (ref 70–99)

## 2011-05-06 LAB — SEDIMENTATION RATE: Sed Rate: 115 mm/hr — ABNORMAL HIGH (ref 0–16)

## 2011-05-06 MED ORDER — LORAZEPAM 2 MG/ML IJ SOLN
1.0000 mg | Freq: Four times a day (QID) | INTRAMUSCULAR | Status: AC | PRN
  Filled 2011-05-06: qty 1

## 2011-05-06 MED ORDER — LORAZEPAM 1 MG PO TABS
1.0000 mg | ORAL_TABLET | Freq: Four times a day (QID) | ORAL | Status: AC | PRN
  Administered 2011-05-08: 1 mg via ORAL
  Filled 2011-05-06: qty 1

## 2011-05-06 MED ORDER — DEXTROSE 5 % IV SOLN
1.0000 g | Freq: Once | INTRAVENOUS | Status: AC
  Administered 2011-05-06: 1 g via INTRAVENOUS
  Filled 2011-05-06: qty 10

## 2011-05-06 MED ORDER — ACETAMINOPHEN 650 MG RE SUPP: 650.0000 mg | Freq: Four times a day (QID) | RECTAL | Status: DC | PRN

## 2011-05-06 MED ORDER — ONDANSETRON HCL 4 MG PO TABS: 4.0000 mg | ORAL_TABLET | Freq: Three times a day (TID) | ORAL | Status: DC | PRN

## 2011-05-06 MED ORDER — NICOTINE 21 MG/24HR TD PT24
21.0000 mg | MEDICATED_PATCH | Freq: Every day | TRANSDERMAL | Status: DC
  Administered 2011-05-06 – 2011-05-10 (×5): 21 mg via TRANSDERMAL
  Filled 2011-05-06 (×5): qty 1

## 2011-05-06 MED ORDER — DEXTROSE 5 % IV SOLN
500.0000 mg | Freq: Once | INTRAVENOUS | Status: AC
  Administered 2011-05-06: 500 mg via INTRAVENOUS
  Filled 2011-05-06: qty 500

## 2011-05-06 MED ORDER — GABAPENTIN 300 MG PO CAPS
300.0000 mg | ORAL_CAPSULE | Freq: Three times a day (TID) | ORAL | Status: DC
  Administered 2011-05-06 – 2011-05-10 (×11): 300 mg via ORAL
  Filled 2011-05-06 (×13): qty 1

## 2011-05-06 MED ORDER — SIMVASTATIN 40 MG PO TABS
40.0000 mg | ORAL_TABLET | Freq: Every day | ORAL | Status: DC
  Administered 2011-05-06 – 2011-05-09 (×4): 40 mg via ORAL
  Filled 2011-05-06 (×5): qty 1

## 2011-05-06 MED ORDER — ENOXAPARIN SODIUM 40 MG/0.4ML ~~LOC~~ SOLN
40.0000 mg | SUBCUTANEOUS | Status: DC
  Administered 2011-05-06 – 2011-05-09 (×4): 40 mg via SUBCUTANEOUS
  Filled 2011-05-06 (×5): qty 0.4

## 2011-05-06 MED ORDER — ALBUTEROL SULFATE (5 MG/ML) 0.5% IN NEBU
2.5000 mg | INHALATION_SOLUTION | Freq: Four times a day (QID) | RESPIRATORY_TRACT | Status: DC
  Administered 2011-05-07: 2.5 mg via RESPIRATORY_TRACT
  Filled 2011-05-06: qty 0.5

## 2011-05-06 MED ORDER — FLUOXETINE HCL 20 MG PO CAPS
20.0000 mg | ORAL_CAPSULE | Freq: Every day | ORAL | Status: DC
  Administered 2011-05-07 – 2011-05-10 (×4): 20 mg via ORAL
  Filled 2011-05-06 (×4): qty 1

## 2011-05-06 MED ORDER — ONDANSETRON HCL 4 MG/2ML IJ SOLN
4.0000 mg | Freq: Once | INTRAMUSCULAR | Status: AC
  Administered 2011-05-06: 4 mg via INTRAVENOUS
  Filled 2011-05-06: qty 2

## 2011-05-06 MED ORDER — FAMOTIDINE 20 MG PO TABS
20.0000 mg | ORAL_TABLET | Freq: Every day | ORAL | Status: DC
  Administered 2011-05-06 – 2011-05-10 (×5): 20 mg via ORAL
  Filled 2011-05-06 (×5): qty 1

## 2011-05-06 MED ORDER — HYDROCODONE-ACETAMINOPHEN 5-325 MG PO TABS
1.0000 | ORAL_TABLET | Freq: Four times a day (QID) | ORAL | Status: DC | PRN
  Administered 2011-05-06 – 2011-05-07 (×2): 2 via ORAL
  Administered 2011-05-09: 1 via ORAL
  Administered 2011-05-10 (×2): 2 via ORAL
  Filled 2011-05-06 (×2): qty 1
  Filled 2011-05-06 (×2): qty 2
  Filled 2011-05-06: qty 1
  Filled 2011-05-06: qty 2

## 2011-05-06 MED ORDER — DEXTROSE 5 % IV SOLN
1.0000 g | INTRAVENOUS | Status: DC
  Administered 2011-05-07: 1 g via INTRAVENOUS
  Filled 2011-05-06 (×2): qty 10

## 2011-05-06 MED ORDER — ASPIRIN EC 81 MG PO TBEC
81.0000 mg | DELAYED_RELEASE_TABLET | Freq: Every day | ORAL | Status: DC
  Administered 2011-05-06 – 2011-05-10 (×5): 81 mg via ORAL
  Filled 2011-05-06 (×5): qty 1

## 2011-05-06 MED ORDER — VITAMIN B-1 100 MG PO TABS
100.0000 mg | ORAL_TABLET | Freq: Every day | ORAL | Status: DC
  Administered 2011-05-06 – 2011-05-10 (×5): 100 mg via ORAL
  Filled 2011-05-06 (×6): qty 1

## 2011-05-06 MED ORDER — SODIUM CHLORIDE 0.9 % IV BOLUS (SEPSIS)
1000.0000 mL | Freq: Once | INTRAVENOUS | Status: AC
  Administered 2011-05-06: 1000 mL via INTRAVENOUS

## 2011-05-06 MED ORDER — AZITHROMYCIN 500 MG PO TABS
500.0000 mg | ORAL_TABLET | Freq: Every day | ORAL | Status: DC
  Administered 2011-05-07 – 2011-05-08 (×2): 500 mg via ORAL
  Filled 2011-05-06 (×2): qty 1

## 2011-05-06 MED ORDER — FOLIC ACID 1 MG PO TABS
1.0000 mg | ORAL_TABLET | Freq: Every day | ORAL | Status: DC
  Administered 2011-05-06 – 2011-05-10 (×5): 1 mg via ORAL
  Filled 2011-05-06 (×5): qty 1

## 2011-05-06 MED ORDER — LORAZEPAM 1 MG PO TABS: 0.0000 mg | ORAL_TABLET | Freq: Two times a day (BID) | ORAL | Status: DC

## 2011-05-06 MED ORDER — INSULIN ASPART 100 UNIT/ML ~~LOC~~ SOLN
0.0000 [IU] | Freq: Three times a day (TID) | SUBCUTANEOUS | Status: DC
  Administered 2011-05-07 – 2011-05-10 (×4): 2 [IU] via SUBCUTANEOUS
  Filled 2011-05-06: qty 3

## 2011-05-06 MED ORDER — HYDROMORPHONE HCL PF 1 MG/ML IJ SOLN
1.0000 mg | Freq: Once | INTRAMUSCULAR | Status: AC
  Administered 2011-05-06: 1 mg via INTRAVENOUS
  Filled 2011-05-06: qty 1

## 2011-05-06 MED ORDER — ASPIRIN 81 MG PO TBEC: 81.0000 mg | DELAYED_RELEASE_TABLET | Freq: Every day | ORAL | Status: DC

## 2011-05-06 MED ORDER — ONDANSETRON HCL 4 MG/2ML IJ SOLN: 4.0000 mg | Freq: Four times a day (QID) | INTRAMUSCULAR | Status: DC | PRN

## 2011-05-06 MED ORDER — ALBUTEROL SULFATE (5 MG/ML) 0.5% IN NEBU: 2.5000 mg | INHALATION_SOLUTION | RESPIRATORY_TRACT | Status: DC | PRN

## 2011-05-06 MED ORDER — LORAZEPAM 1 MG PO TABS
0.0000 mg | ORAL_TABLET | Freq: Four times a day (QID) | ORAL | Status: AC
  Administered 2011-05-06: 2 mg via ORAL
  Administered 2011-05-07: 1 mg via ORAL
  Filled 2011-05-06: qty 1
  Filled 2011-05-06: qty 2

## 2011-05-06 MED ORDER — SODIUM CHLORIDE 0.9 % IV SOLN
INTRAVENOUS | Status: AC
  Administered 2011-05-06 – 2011-05-07 (×3): via INTRAVENOUS

## 2011-05-06 MED ORDER — ACETAMINOPHEN 325 MG PO TABS: 650.0000 mg | ORAL_TABLET | Freq: Four times a day (QID) | ORAL | Status: DC | PRN

## 2011-05-06 NOTE — H&P (Signed)
Hospital Admission Note Date: 05/06/2011  Patient name: Andrew Fowler Medical record number: [id number] Date of birth: 1960-11-09 Age: 51 y.o. Gender: male PCP: Ivor Costa, MD, MD  Medical Service: Int Med  Attending physician:  Baxter Flattery    Pager: Resident (R2/R3): [place]    Pager: [phone] Resident (R1): [place]   Pager: [phone]  Chief Complaint: Polyarthralgia  History of Present Illness: Patient is a 51 year old man with history of multiple joint pains, possible chronic pain syndrome, anxiety with associated depression, diabetes, possible gout, and hypertension who presents with continued polyarthralgia.  Patient was seen in [facility] twice in the month of January with continued pains in the bilateral hands, ankles, knees, and shoulders. At the first appointment he had a suspected gout flare and was prescribed colchicine with some improvement. At the second appointment, some labs were drawn that included an elevated ESR, CRP, and anti-CCP, and he was going to followup for hand x-rays.  In addition, he has an orthopedist who has been working him up for cervical neck pain. He had a repeat MRI last week that showed no progression of foraminal narrowing at C4-5 and C5-6.  Since his last appointment, the patient has had worsening pains diffusely, and has now become weaker and more deconditioned. Over the past couple of days he has been unable to get out of bed, requiring assistance from family. He has urinated on himself as well as defecated in the bed.  He has additional complaints that include cough, chills, diarrhea, and right-sided chest pain is worse with inspiration. At baseline he has a power wheelchair but over the past few weeks has essentially become bedridden.  Past Medical History: Past Medical History  Diagnosis Date  . COPD (chronic obstructive pulmonary disease)     Per pt, he has a diagnosis of COPD. Need to locate PFT's.  . Hypertension     ACEI monotherapy  . Hyperlipidemia     At  goal of LDL <100 with statin.  . Obesity, Class III, BMI 40-49.9 (morbid obesity)   . Tobacco abuse     Per pt, he has a diagnosis of COPD. Need to locate PFT's.  . Gout     Pt has never had a crystal diagnosis.  . Diabetes mellitus     Non-insulin dependent Type II.  Marland Kitchen Obstructive sleep apnea 08/21/08    Sleep study AHI 16.4 with desat to 66%. CPAP of 17 decreased AHI to 0.9. Non-compliant with CPAP.  Marland Kitchen Anxiety associated with depression 04/19/2011    On chronic benzos and SSRI's. Gets panic attacks. Does not see mental health.  . Chronic pain syndrome     s/p L4-L5 laminectomy. Lumbar MRI 4/11 : L3-L4 mod central canal narrowing.  Cervical MRI 6/11 : multi-level DDD and L foraminal stenosis at C4-5 and C5-6.   Past Surgical History  Procedure Date  . Laminectomy 1995    L4-L5  . Joint replacement 2009    left hip replacement    Meds: Prior to Admission medications   Medication Sig Start Date End Date Taking? Authorizing Provider  albuterol (VENTOLIN HFA) 108 (90 BASE) MCG/ACT inhaler Inhale 1-2 puffs into the lungs every 4 (four) hours as needed. shortness of breath   Yes Historical Provider, MD  ALPRAZolam (XANAX) 0.5 MG tablet Take 0.5 mg by mouth 3 (three) times daily. For anxiety 01/31/11  Yes Janell Quiet, MD  aspirin (BAYER LOW STRENGTH) 81 MG EC tablet Take 81 mg by mouth daily.     Yes  Historical Provider, MD  FLUoxetine (PROZAC) 20 MG capsule Take 20 mg by mouth daily. 01/31/11  Yes Janell Quiet, MD  gabapentin (NEURONTIN) 300 MG capsule Take 300 mg by mouth 3 (three) times daily. 04/23/11  Yes Rich Reining, MD  ibuprofen (ADVIL,MOTRIN) 200 MG tablet Take 800-1,200 mg by mouth 6 (six) times daily. For pain   Yes Historical Provider, MD  lisinopril (PRINIVIL,ZESTRIL) 10 MG tablet Take 10 mg by mouth daily. 01/31/11  Yes Janell Quiet, MD  metFORMIN (GLUCOPHAGE) 500 MG tablet Take 500 mg by mouth 2 (two) times daily with a meal. 01/31/11  Yes Janell Quiet, MD  simvastatin (ZOCOR)  40 MG tablet Take 40 mg by mouth at bedtime. 01/31/11  Yes Janell Quiet, MD    Allergies: Review of patient's allergies indicates no known allergies.  Family Hx: Family History  Problem Relation Age of Onset  . Diabetes Mother   . Heart attack Mother   . Heart disease Mother   . Diabetes Father   . Kidney disease Father     Social Hx: History   Social History  . Marital Status: Single    Spouse Name: N/A    Number of Children: N/A  . Years of Education: N/A   Occupational History  . Not on file.   Social History Main Topics  . Smoking status: Current Everyday Smoker -- 1.0 packs/day    Types: Cigarettes  . Smokeless tobacco: Not on file  . Alcohol Use: 0.0 oz/week     weekends  . Drug Use: No  . Sexually Active: Not on file   Other Topics Concern  . Not on file   Social History Narrative  . No narrative on file    Review of Systems: As per history of present illness  Physical Exam: Filed Vitals:   05/06/11 1647 05/06/11 1700 05/06/11 1715 05/06/11 1830  BP: 113/62 108/61 108/61 100/59  Pulse: 100 101 101 100  Temp:      TempSrc:      Resp: 20 21 24 19   SpO2: 93% 92% 92% 92%   GEN: Lying in bed very still.  Alert and oriented x 3.  Conversant, resistant to any movement. HEENT: head is autraumatic and normocephalic.  Neck is supple without palpable masses or lymphadenopathy.  Very poor dentition RESP:  Diffuse wheezes b/l, no localizing rhonchi. CARDIOVASCULAR: regular rate, normal rhythm.  Clear S1, S2, no murmurs, gallops, or rubs. ABDOMEN: soft, non-tender, non-distended.  Bowels sounds present in all quadrants and normoactive.  No palpable masses. EXT: diffuse TTP with any movement of essentially any limb.  Notable findings include swelling at the PIP's of the b/l hands. SKIN: warm and dry with normal turgor.  No rashes or abnormal lesions observed. GU: perineal sensation intact, good anal sphincter tone NEURO: CN II-XII grossly intact.  Muscle strength  2/5 diffusely.  Unable to participate in cerebellar testing or gait.  Lab results: Basic Metabolic Panel:  Basename 05/06/11 1453  NA 132*  K 4.8  CL 93*  CO2 24  GLUCOSE 79  BUN 34*  CREATININE 0.77  CALCIUM 10.7*  MG --  PHOS --   CBC:  Basename 05/06/11 1453  WBC 14.5*  NEUTROABS 11.7*  HGB 13.1  HCT 40.1  MCV 89.5  PLT 451*   CBG:  Basename 05/06/11 1552  GLUCAP 79   Urine Drug Screen: Drugs of Abuse     Component Value Date/Time   LABOPIA POS* 04/23/2011 1113   [place] DETECTED 06/25/2010  0022   COCAINSCRNUR NEG 04/23/2011 1113   COCAINSCRNUR NONE DETECTED 06/25/2010 0022   LABBENZ POS* 04/23/2011 1113   LABBENZ NONE DETECTED 06/25/2010 0022   AMPHETMU NONE DETECTED 06/25/2010 0022   THCU NONE DETECTED 06/25/2010 0022   LABBARB NEG 04/23/2011 1113   LABBARB  Value: NONE DETECTED        DRUG SCREEN FOR MEDICAL PURPOSES ONLY.  IF CONFIRMATION IS NEEDED FOR ANY PURPOSE, NOTIFY LAB WITHIN 5 DAYS.        LOWEST DETECTABLE LIMITS FOR URINE DRUG SCREEN Drug Class       Cutoff (ng/mL) Amphetamine      1000 Barbiturate      200 Benzodiazepine   035 Tricyclics       597 Opiates          300 Cocaine          300 THC              50 06/25/2010 0022    Urinalysis:  Basename 05/06/11 1555  COLORURINE YELLOW  LABSPEC 1.019  PHURINE 5.5  GLUCOSEU NEGATIVE  HGBUR NEGATIVE  BILIRUBINUR NEGATIVE  KETONESUR NEGATIVE  PROTEINUR NEGATIVE  UROBILINOGEN 1.0  NITRITE NEGATIVE  LEUKOCYTESUR NEGATIVE    Imaging results:  Dg Chest 1 View  05/06/2011  *RADIOLOGY REPORT*  Clinical Data: Right chest pain, chills, history COPD, hypertension, diabetes  CHEST - 1 VIEW  Comparison: 04/13/2011  Findings: Minimal enlargement of cardiac silhouette. Mediastinal contours and pulmonary vascularity grossly stable for technique. Right basilar infiltrate consistent with pneumonia. Questionable additional infiltrate peripherally in right upper lobe. No pleural effusion or pneumothorax. Bones  unremarkable.  IMPRESSION: Right basilar infiltrate consistent with pneumonia.  Original Report Authenticated By: Burnetta Sabin, M.D.    Assessment & Plan by Problem: # Cough Patient presents with increased cough, mild leukocytosis, and subjective fevers with chest x-ray indicating possible right lower lobe infiltrate. We will treat him for community acquired pneumonia. He also has substantial wheezing on exam, given his extensive smoking history this could indicate a component of reactive airway disease (he has no formal diagnosis of COPD).  Given his h/o alcohol abuse there is also concern for aspiration, but the pt does not report blacking out with EtOH. Patient has had soft blood pressures, but this is likely secondary to his lying down and stasis. We will have her low threshold for increasing his level of care, though on exam he appears to be in no distress. - Azithromycin - Ceftriaxone - CBC in a.m. - abx started prior to blood cx - Consider urine Legionella and strep if spikes fever - Albuterol nebs scheduled - Normal saline bolus then maintenance - if spikes fever or clinically worsens, will broaden and get blood cultures  # Polyarthralgia & Chronic Pain Syndrome Patient presents with worsening polyarthralgia of the bilateral ankles, knees, hands, shoulders. This has been worsening over the past few months. Patient was seen twice as an outpatient with a perplexing presentation, consistent with possible chronic pain syndrome, possible gout, versus another rheumatologic condition. His diffuse pain in many different joints including the DIPs lead the [facility] doctors away initially from a rheumatologic diagnosis. However, a few weeks ago an ESR, CRP, and anti-CCP were all elevated, raising concern for rheumatoid arthritis.  He does appear to have swelling at the PIPs, which he says are the most painful.  His cervical MRI from last week is stable, indicating no urgent indication for orthopedic  intervention. At present, we should consider  to workup possible rheumatologic causes and other etiologies. He does not have any overt joint swelling or warmth that would indicate aspiration, and his pain is too diffuse for gout. - Repeat ESR, CRP - xrays b/l hands - Consider rheumatology consult - Pain control with Tylenol and Vicodin  # Anxiety and depression Patient was on 3 times daily dosing of Xanax, and also endorses drinking 3-5 beers daily, with his last drink was in the past 24 hours. - CIWA - Benzos when necessary for anxiety - Continue fluoxetine - UDS  #HTN Holding antiHTN meds for soft BP's right now.  Echo from last week was wnl, EF 55-60%.  # DM A1c from 01/2011 was 6.4 on metformin only. - repeat A1c - hold metformin - SSI while in hospital  #Tobacco Abuse - nicotine patch - smoking cessation has been attempted with the pt in the past, but consider again this admission  # DVT - lovenox

## 2011-05-06 NOTE — ED Notes (Signed)
[id number] Ready

## 2011-05-06 NOTE — ED Notes (Signed)
[id number] Ready

## 2011-05-06 NOTE — ED Notes (Signed)
Pt complaining of leg pain..RN notified.

## 2011-05-06 NOTE — Telephone Encounter (Signed)
Agree with plan 

## 2011-05-06 NOTE — Telephone Encounter (Signed)
Pt's brother calls c/o that pt has not been able to walk or move extremeties for appr 2 days, he is not continent of bladder or bowels, is using diapers. He c/o pain over entire body. Also c/o chest pain and nonproductive cough. He denies fevers. He is ask to call 911 and come to [place]

## 2011-05-06 NOTE — ED Provider Notes (Signed)
History     CSN: [id number]  Arrival date & time 05/06/11  1131   First MD Initiated Contact with Patient 05/06/11 1407      Chief Complaint  Patient presents with  . Joint Pain    (Consider location/radiation/quality/duration/timing/severity/associated sxs/prior treatment) HPI Comments: The patient also complains of diffuse arthralgias and myalgias. States he's had multiple clinic and emergency department visits for this pain. Has been seeing Dr. Sharol Given from orthopedics. Has been evaluated by MRI. Has been taking pain medication with limited results. Getting to the point where he is unable to care for himself at home. Is unable to make it to the bathroom.  Is a patient of [facility]  Patient is a 51 y.o. male presenting with cough. The history is provided by the patient. No language interpreter was used.  Cough This is a new problem. The current episode started more than 1 week ago. The problem occurs constantly. The problem has been gradually worsening. The cough is non-productive. Maximum temperature: subjective fever. Associated symptoms include chest pain, myalgias and shortness of breath. Pertinent negatives include no chills, no sweats, no ear congestion, no ear pain, no headaches, no rhinorrhea and no sore throat. He has tried nothing for the symptoms. He is a smoker. His past medical history is significant for COPD.    Past Medical History  Diagnosis Date  . COPD (chronic obstructive pulmonary disease)     Per pt, he has a diagnosis of COPD. Need to locate PFT's.  . Hypertension     ACEI monotherapy  . Hyperlipidemia     At goal of LDL <100 with statin.  . Obesity, Class III, BMI 40-49.9 (morbid obesity)   . Tobacco abuse     Per pt, he has a diagnosis of COPD. Need to locate PFT's.  . Gout     Pt has never had a crystal diagnosis.  . Diabetes mellitus     Non-insulin dependent Type II.  Marland Kitchen Obstructive sleep apnea 08/21/08    Sleep study AHI 16.4 with desat to 66%. CPAP of 17  decreased AHI to 0.9. Non-compliant with CPAP.  Marland Kitchen Anxiety associated with depression 04/19/2011    On chronic benzos and SSRI's. Gets panic attacks. Does not see mental health.  . Chronic pain syndrome     s/p L4-L5 laminectomy. Lumbar MRI 4/11 : L3-L4 mod central canal narrowing.  Cervical MRI 6/11 : multi-level DDD and L foraminal stenosis at C4-5 and C5-6.    Past Surgical History  Procedure Date  . Laminectomy 1995    L4-L5  . Joint replacement 2009    left hip replacement    Family History  Problem Relation Age of Onset  . Diabetes Mother   . Heart attack Mother   . Heart disease Mother   . Diabetes Father   . Kidney disease Father     History  Substance Use Topics  . Smoking status: Current Everyday Smoker -- 1.0 packs/day    Types: Cigarettes  . Smokeless tobacco: Not on file  . Alcohol Use: 0.0 oz/week     weekends      Review of Systems  Constitutional: Positive for fever (subjective), activity change and appetite change. Negative for chills and fatigue.  HENT: Positive for congestion. Negative for ear pain, sore throat, rhinorrhea, neck pain and neck stiffness.   Respiratory: Positive for cough and shortness of breath.   Cardiovascular: Positive for chest pain. Negative for palpitations.  Gastrointestinal: Negative for nausea, vomiting, abdominal pain, diarrhea  and constipation.  Genitourinary: Negative for dysuria, urgency, frequency and flank pain.  Musculoskeletal: Positive for myalgias, back pain and arthralgias.  Neurological: Positive for weakness (diffuse). Negative for dizziness, light-headedness, numbness and headaches.  All other systems reviewed and are negative.    Allergies  Review of patient's allergies indicates no known allergies.  Home Medications   Current Outpatient Rx  Name Route Sig Dispense Refill  . ALBUTEROL SULFATE HFA 108 (90 BASE) MCG/ACT IN AERS Inhalation Inhale 1-2 puffs into the lungs every 4 (four) hours as needed.  shortness of breath    . ALPRAZOLAM 0.5 MG PO TABS Oral Take 0.5 mg by mouth 3 (three) times daily. For anxiety    . ASPIRIN 81 MG PO TBEC Oral Take 81 mg by mouth daily.      Marland Kitchen FLUOXETINE HCL 20 MG PO CAPS Oral Take 20 mg by mouth daily.    Marland Kitchen GABAPENTIN 300 MG PO CAPS Oral Take 300 mg by mouth 3 (three) times daily.    . IBUPROFEN 200 MG PO TABS Oral Take 800-1,200 mg by mouth 6 (six) times daily. For pain    . LISINOPRIL 10 MG PO TABS Oral Take 10 mg by mouth daily.    Marland Kitchen METFORMIN HCL 500 MG PO TABS Oral Take 500 mg by mouth 2 (two) times daily with a meal.    . SIMVASTATIN 40 MG PO TABS Oral Take 40 mg by mouth at bedtime.      BP 117/62  Pulse 97  Temp(Src) 97 F (36.1 C) (Oral)  Resp 20  SpO2 95%  Physical Exam  Nursing note and vitals reviewed. Constitutional: He is oriented to person, place, and time.       morbidly obese.  Appears uncomfortable  HENT:  Head: Normocephalic and atraumatic.       MM dry  Eyes: Conjunctivae and EOM are normal. Pupils are equal, round, and reactive to light.  Neck: Normal range of motion. Neck supple.  Cardiovascular: Normal rate, regular rhythm, normal heart sounds and intact distal pulses.  Exam reveals no gallop and no friction rub.   No murmur heard. Pulmonary/Chest: Effort normal. No respiratory distress. He has no wheezes. He has no rales. He exhibits no tenderness.       Diminished breath sounds at the right base  Abdominal: Soft. Bowel sounds are normal. He exhibits no distension. There is no tenderness. There is no rebound and no guarding.  Musculoskeletal: He exhibits tenderness. He exhibits no edema.       Tenderness to both shoulders. Limited range of motion in the shoulders secondary to pain. She would not move his legs secondary to pain. There was some pain on passive range of motion of the knees  Neurological: He is alert and oriented to person, place, and time. No cranial nerve deficit.  Skin: Skin is warm and dry. No rash noted.     ED Course  Procedures (including critical care time)   Date: 05/06/2011  Rate: 101  Rhythm: sinus tachycardia  QRS Axis: normal  Intervals: normal  ST/T Wave abnormalities: normal  Conduction Disutrbances:none  Narrative Interpretation:   Old EKG Reviewed: unchanged  Labs Reviewed  CBC - Abnormal; Notable for the following:    WBC 14.5 (*)    Platelets 451 (*)    All other components within normal limits  DIFFERENTIAL - Abnormal; Notable for the following:    Neutrophils Relative 81 (*)    Neutro Abs 11.7 (*)    Lymphocytes  Relative 10 (*)    All other components within normal limits  BASIC METABOLIC PANEL - Abnormal; Notable for the following:    Sodium 132 (*)    Chloride 93 (*)    BUN 34 (*)    Calcium 10.7 (*)    All other components within normal limits  POCT I-STAT TROPONIN I  URINALYSIS, ROUTINE W REFLEX MICROSCOPIC  GLUCOSE, CAPILLARY   Dg Chest 1 View  05/06/2011  *RADIOLOGY REPORT*  Clinical Data: Right chest pain, chills, history COPD, hypertension, diabetes  CHEST - 1 VIEW  Comparison: 04/13/2011  Findings: Minimal enlargement of cardiac silhouette. Mediastinal contours and pulmonary vascularity grossly stable for technique. Right basilar infiltrate consistent with pneumonia. Questionable additional infiltrate peripherally in right upper lobe. No pleural effusion or pneumothorax. Bones unremarkable.  IMPRESSION: Right basilar infiltrate consistent with pneumonia.  Original Report Authenticated By: Burnetta Sabin, M.D.     1. CAP (community acquired pneumonia)   2. Arthralgia   3. FTT (failure to thrive) in adult   4. Obesity, Class III, BMI 40-49.9 (morbid obesity)       MDM  Evidence of community acquired pneumonia on chest x-ray. Was placed on Rocephin and Zithromax. The patient has been unable to care for himself at home and requires admission to the hospital. Leukocytosis of 14. Internal Medicine teaching service will admit the patient for further  evaluation and treatment. I feel he is eligible for placement as he is unable to care for himself adequately at home.        Trisha Mangle, MD 05/06/11 [phone]

## 2011-05-06 NOTE — ED Notes (Signed)
Per EMS: pt c/o generalized body aches and joint pain x several weeks that is more severe today; pt denies obvious injury; pt sts generalized pain all over with some pain in rib area with cough; pt sts possible fever; pt sts coughing

## 2011-05-07 DIAGNOSIS — J189 Pneumonia, unspecified organism: Secondary | ICD-10-CM

## 2011-05-07 DIAGNOSIS — M2550 Pain in unspecified joint: Secondary | ICD-10-CM

## 2011-05-07 LAB — CBC
HCT: 37.2 % — ABNORMAL LOW (ref 39.0–52.0)
Hemoglobin: 12.2 g/dL — ABNORMAL LOW (ref 13.0–17.0)
MCH: 29.5 pg (ref 26.0–34.0)
MCHC: 32.8 g/dL (ref 30.0–36.0)
MCV: 90.1 fL (ref 78.0–100.0)

## 2011-05-07 LAB — GLUCOSE, CAPILLARY: Glucose-Capillary: 130 mg/dL — ABNORMAL HIGH (ref 70–99)

## 2011-05-07 LAB — BASIC METABOLIC PANEL
BUN: 19 mg/dL (ref 6–23)
CO2: 26 mEq/L (ref 19–32)
Chloride: 98 mEq/L (ref 96–112)
Glucose, Bld: 80 mg/dL (ref 70–99)
Potassium: 4.3 mEq/L (ref 3.5–5.1)

## 2011-05-07 LAB — C-REACTIVE PROTEIN: CRP: 22.72 mg/dL — ABNORMAL HIGH (ref <0.60)

## 2011-05-07 MED ORDER — METHYLPREDNISOLONE SODIUM SUCC 125 MG IJ SOLR
125.0000 mg | Freq: Three times a day (TID) | INTRAMUSCULAR | Status: DC
  Administered 2011-05-07: 125 mg via INTRAVENOUS
  Filled 2011-05-07 (×5): qty 2

## 2011-05-07 MED ORDER — PREDNISONE 20 MG PO TABS
40.0000 mg | ORAL_TABLET | Freq: Every day | ORAL | Status: DC
  Administered 2011-05-08 – 2011-05-10 (×3): 40 mg via ORAL
  Filled 2011-05-07 (×4): qty 2

## 2011-05-07 MED ORDER — ALBUTEROL SULFATE (5 MG/ML) 0.5% IN NEBU: 2.5000 mg | INHALATION_SOLUTION | Freq: Three times a day (TID) | RESPIRATORY_TRACT | Status: DC

## 2011-05-07 MED ORDER — ALPRAZOLAM 0.5 MG PO TABS
0.5000 mg | ORAL_TABLET | Freq: Three times a day (TID) | ORAL | Status: DC | PRN
  Administered 2011-05-07 – 2011-05-08 (×2): 0.5 mg via ORAL
  Filled 2011-05-07: qty 2
  Filled 2011-05-07: qty 1

## 2011-05-07 MED ORDER — ALBUTEROL SULFATE (5 MG/ML) 0.5% IN NEBU: 2.5000 mg | INHALATION_SOLUTION | Freq: Two times a day (BID) | RESPIRATORY_TRACT | Status: DC

## 2011-05-07 MED ORDER — ALBUTEROL SULFATE (5 MG/ML) 0.5% IN NEBU
2.5000 mg | INHALATION_SOLUTION | Freq: Three times a day (TID) | RESPIRATORY_TRACT | Status: DC
  Administered 2011-05-07 – 2011-05-08 (×4): 2.5 mg via RESPIRATORY_TRACT
  Filled 2011-05-07 (×5): qty 0.5

## 2011-05-07 MED ORDER — ALBUTEROL SULFATE (5 MG/ML) 0.5% IN NEBU: 2.5000 mg | INHALATION_SOLUTION | Freq: Four times a day (QID) | RESPIRATORY_TRACT | Status: DC | PRN

## 2011-05-07 MED ORDER — LORAZEPAM 1 MG PO TABS
1.0000 mg | ORAL_TABLET | Freq: Three times a day (TID) | ORAL | Status: DC
  Administered 2011-05-07 – 2011-05-10 (×7): 1 mg via ORAL
  Filled 2011-05-07 (×7): qty 1

## 2011-05-07 NOTE — H&P (Signed)
Internal Medicine Teaching Service Attending Note Date: 05/07/2011  Patient name: Andrew Fowler  Medical record number: [id number]  Date of birth: [birth year]    This patient has been seen and discussed with the house staff. Please see their note for complete details. I concur with their findings with the following additions/corrections: [name] is a 51yo Male who has had worsening pain and immobility of multiple joints. He initially reports having shoulder, wrist, hands involvement, followed by knees and feet. He has become so incapitated he uses a "scratching stick" to reach his head since he is unable to move his arm up to reach his head. He has significantly elevated inflammatory markers, elevated RF, and anti-CCP concerning for rheumatoid arthritis. We will start him on pulse dose steroids for the moment and have rheumatology consultation.  Elzie Rings Ensign for Infectious Diseases [phone]  Carlyle Basques 05/07/2011, 1:33 PM

## 2011-05-07 NOTE — Progress Notes (Addendum)
Subjective: No events overnight, pt feels a little better in terms of breathing this a.m.  Objective: Vital signs in last 24 hours: Filed Vitals:   05/06/11 1830 05/06/11 2003 05/07/11 0500 05/07/11 1126  BP: 100/59 87/58 99/63  124/83  Pulse: 100 105 106 105  Temp:  97.9 F (36.6 C) 98.2 F (36.8 C)   TempSrc:  Oral Oral   Resp: 19 20 20    Height:  5' 10.5" (1.791 m)    Weight:  281 lb 8 oz (127.688 kg)    SpO2: 92% 94% 93%    Weight change:   Intake/Output Summary (Last 24 hours) at 05/07/11 1218 Last data filed at 05/07/11 1100  Gross per 24 hour  Intake 1733.33 ml  Output   1450 ml  Net 283.33 ml   Physical Exam: GEN: NAD.  Alert and oriented x 3.  Pleasant, conversant, and cooperative to exam. RESP:  Trace wheezes diffusely CARDIOVASCULAR: RRR, S1, S2, no m/r/g ABDOMEN: soft, NT/ND, NABS EXT: warm and dry. No edema in b/l LE  Lab Results: Basic Metabolic Panel:  Lab 54/27/06 0532 05/06/11 1453  NA 133* 132*  K 4.3 4.8  CL 98 93*  CO2 26 24  GLUCOSE 80 79  BUN 19 34*  CREATININE 0.65 0.77  CALCIUM 10.3 10.7*  MG -- --  PHOS -- --   CBC:  Lab 05/07/11 0532 05/06/11 1453  WBC 11.2* 14.5*  NEUTROABS -- 11.7*  HGB 12.2* 13.1  HCT 37.2* 40.1  MCV 90.1 89.5  PLT 408* 451*   Cardiac Enzymes:  Lab 05/06/11 2116  CKTOTAL 190  CKMB 1.2  CKMBINDEX --  TROPONINI <0.30   CBG:  Lab 05/07/11 1122 05/07/11 0736 05/06/11 2019 05/06/11 1552  GLUCAP 82 79 92 79   Hemoglobin A1C:  Lab 05/06/11 2115  HGBA1C 6.1*   Urinalysis:  Lab 05/06/11 1555  COLORURINE YELLOW  LABSPEC 1.019  PHURINE 5.5  GLUCOSEU NEGATIVE  HGBUR NEGATIVE  BILIRUBINUR NEGATIVE  KETONESUR NEGATIVE  PROTEINUR NEGATIVE  UROBILINOGEN 1.0  NITRITE NEGATIVE  LEUKOCYTESUR NEGATIVE   Micro Results: No results found for this or any previous visit (from the past 240 hour(s)). Studies/Results: Dg Chest 1 View  05/06/2011  *RADIOLOGY REPORT*  Clinical Data: Right chest pain,  chills, history COPD, hypertension, diabetes  CHEST - 1 VIEW  Comparison: 04/13/2011  Findings: Minimal enlargement of cardiac silhouette. Mediastinal contours and pulmonary vascularity grossly stable for technique. Right basilar infiltrate consistent with pneumonia. Questionable additional infiltrate peripherally in right upper lobe. No pleural effusion or pneumothorax. Bones unremarkable.  IMPRESSION: Right basilar infiltrate consistent with pneumonia.  Original Report Authenticated By: Burnetta Sabin, M.D.   Dg Hand Complete Left  05/06/2011  *RADIOLOGY REPORT*  Clinical Data: Bilateral hand pain and swelling.  LEFT HAND - COMPLETE 3+ VIEW  Comparison: Right hand films from the same day.  Findings: A similar appearance of degenerative change at the first [facility] joint is noted.  Age advanced degenerative changes are present in the DIP and PIP joints.  No significant erosions are present. Bone mineralization is within normal limits.  IMPRESSION:  1.  Degenerative changes compatible with osteoarthritis.  Original Report Authenticated By: Resa Miner. MATTERN, M.D.   Dg Hand Complete Right  05/06/2011  *RADIOLOGY REPORT*  Clinical Data: Bilateral hand pain and swelling.  RIGHT HAND - COMPLETE 3+ VIEW  Comparison: None.  Findings: Degenerative changes are present at the first [facility] joint. There is some loss of joint space in the DIP  greater than PIP joints.  No erosions are present.  Bone mineralization is within normal limits.  IMPRESSION:  1.  Mild osteoarthritic changes as described.  Original Report Authenticated By: Resa Miner. MATTERN, M.D.   Medications: I have reviewed the patient's current medications. Scheduled Meds:   . albuterol  2.5 mg Nebulization BID  . aspirin EC  81 mg Oral Daily  . azithromycin  500 mg Intravenous Once  . azithromycin  500 mg Oral Daily  . cefTRIAXone (ROCEPHIN)  IV  1 g Intravenous Once  . cefTRIAXone (ROCEPHIN)  IV  1 g Intravenous Q24H  . enoxaparin  40 mg  Subcutaneous Q24H  . famotidine  20 mg Oral Daily  . FLUoxetine  20 mg Oral Daily  . folic acid  1 mg Oral Daily  . gabapentin  300 mg Oral TID  .  HYDROmorphone (DILAUDID) injection  1 mg Intravenous Once  .  HYDROmorphone (DILAUDID) injection  1 mg Intravenous Once  . insulin aspart  0-15 Units Subcutaneous TID WC  . LORazepam  0-4 mg Oral Q6H   Followed by  . LORazepam  0-4 mg Oral Q12H  . methylPREDNISolone (SOLU-MEDROL) injection  125 mg Intravenous Q8H  . nicotine  21 mg Transdermal Daily  . ondansetron  4 mg Intravenous Once  . simvastatin  40 mg Oral QHS  . sodium chloride  1,000 mL Intravenous Once  . thiamine  100 mg Oral Daily  . DISCONTD: albuterol  2.5 mg Nebulization Q6H  . DISCONTD: aspirin  81 mg Oral Daily   Continuous Infusions:   . sodium chloride 200 mL/hr at 05/07/11 0649   PRN Meds:.acetaminophen, acetaminophen, albuterol, HYDROcodone-acetaminophen, LORazepam, LORazepam, ondansetron (ZOFRAN) IV, ondansetron, DISCONTD: albuterol  Assessment/Plan: # CAP with reactive airway dz Pt breathing improved overnight.  Afebrile, vitals stable, WBC trending down, wheezes decreasing on exam.  Likely CAP with component of RAD. - Azithromycin  - Ceftriaxone  - Albuterol nebs scheduled  - steroids for prob #2 will likely help with RAD component - if spikes fever or clinically worsens, will broaden and get blood cultures   # Polyarthralgia & Chronic Pain Syndrome  ESR, CRP, RF all markedly elevated.  Combined with elevated anti-CCP from last [facility] visit, there is definite concern for RA or other rheumatic pathology.  Hand xrays indicate OA only.  Still no joint edema amenable to aspiration. - discuss case with Dr. Trudie Reed of [facility] endocrine - solumedrol 131m q8h - consider additional rheum labs: ANA, anti-RNP, anti-ss1/2, (will await Dr. HCathey Endowguidance) - Pain control with Tylenol and Vicodin   # Anxiety and depression  Patient was on 3 times daily dosing of Xanax, and  also endorses drinking 3-5 beers daily, with his last drink was in the past 24 hours.  No s/s of withdrawal at this time. - CIWA  - Benzos when necessary for anxiety  - Continue fluoxetine  - UDS pending  #HTN  Holding antiHTN meds for soft BP's right now. Echo from last week was wnl, EF 55-60%.   # DM  A1c 6.1 on metformin only.  - hold metformin  - SSI while in hospital - steroids will increase CBG, can adjust SSI as needed  #Tobacco Abuse  - nicotine patch  - smoking cessation has been attempted with the pt in the past, but consider again this admission   # DVT  - lovenox   LOS: 1 day   [facility] 05/07/2011, 12:18 PM  Internal Medicine Teaching Service Attending Note  Date: 05/07/2011  Patient name: Andrew Fowler  Medical record number: [id number]  Date of birth: 1960-12-04    This patient has been seen and discussed with Dr. Rich Reining. Please see his note for complete details. I concur with his findings.  Carlyle Basques 05/07/2011, 1:36 PM

## 2011-05-08 ENCOUNTER — Inpatient Hospital Stay (HOSPITAL_COMMUNITY): Payer: Medicaid Other

## 2011-05-08 LAB — HEPATIC FUNCTION PANEL
AST: 31 U/L (ref 0–37)
Bilirubin, Direct: <0.1 mg/dL (ref 0.0–0.3)
Total Bilirubin: 0.3 mg/dL (ref 0.3–1.2)

## 2011-05-08 LAB — CBC
MCH: 29.1 pg (ref 26.0–34.0)
Platelets: 423 10*3/uL — ABNORMAL HIGH (ref 150–400)
RBC: 4.23 MIL/uL (ref 4.22–5.81)
WBC: 10.5 10*3/uL (ref 4.0–10.5)

## 2011-05-08 LAB — HIV ANTIBODY (ROUTINE TESTING W REFLEX): HIV: NONREACTIVE

## 2011-05-08 LAB — GLUCOSE, CAPILLARY: Glucose-Capillary: 106 mg/dL — ABNORMAL HIGH (ref 70–99)

## 2011-05-08 LAB — PROTIME-INR: INR: 1.04 (ref 0.00–1.49)

## 2011-05-08 MED ORDER — DOXYCYCLINE HYCLATE 100 MG PO TABS
100.0000 mg | ORAL_TABLET | Freq: Two times a day (BID) | ORAL | Status: DC
  Administered 2011-05-08 – 2011-05-10 (×4): 100 mg via ORAL
  Filled 2011-05-08 (×6): qty 1

## 2011-05-08 NOTE — Progress Notes (Signed)
Pt placed on 10cmh20 by FFM as stated by pt and what he wears at home and per order. Pt tolerates well for now

## 2011-05-08 NOTE — Progress Notes (Addendum)
Subjective: No events overnight, pt joints and breathing feel improved this a.m. after steroids.  Objective: Vital signs in last 24 hours: Filed Vitals:   05/07/11 1421 05/07/11 2100 05/08/11 0500 05/08/11 0823  BP: 112/74 116/76 130/73   Pulse: 106 105 94 92  Temp: 98.2 F (36.8 C) 98.4 F (36.9 C) 98.1 F (36.7 C)   TempSrc: Oral Oral Oral   Resp: 18 20 20 20   Height:      Weight:      SpO2: 94% 93% 93% 94%   Weight change:   Intake/Output Summary (Last 24 hours) at 05/08/11 0826 Last data filed at 05/08/11 0706  Gross per 24 hour  Intake    560 ml  Output   3350 ml  Net  -2790 ml   Physical Exam: GEN: NAD.  Alert and oriented x 3.  Pleasant, conversant, and cooperative to exam. RESP:  Trace wheezes diffusely CARDIOVASCULAR: RRR, S1, S2, no m/r/g ABDOMEN: soft, NT/ND, NABS EXT: warm and dry. No edema in b/l LE MSK: improved ROM and strength in all joints b/l  Lab Results: Basic Metabolic Panel:  Lab 84/69/62 0532 05/06/11 1453  NA 133* 132*  K 4.3 4.8  CL 98 93*  CO2 26 24  GLUCOSE 80 79  BUN 19 34*  CREATININE 0.65 0.77  CALCIUM 10.3 10.7*  MG -- --  PHOS -- --   CBC:  Lab 05/08/11 0541 05/07/11 0532 05/06/11 1453  WBC 10.5 11.2* --  NEUTROABS -- -- 11.7*  HGB 12.3* 12.2* --  HCT 37.4* 37.2* --  MCV 88.4 90.1 --  PLT 423* 408* --   Cardiac Enzymes:  Lab 05/06/11 2116  CKTOTAL 190  CKMB 1.2  CKMBINDEX --  TROPONINI <0.30   CBG:  Lab 05/08/11 0748 05/07/11 2108 05/07/11 1718 05/07/11 1122 05/07/11 0736 05/06/11 2019  GLUCAP 117* 157* 130* 82 79 92   Hemoglobin A1C:  Lab 05/06/11 2115  HGBA1C 6.1*   Urinalysis:  Lab 05/06/11 1555  COLORURINE YELLOW  LABSPEC 1.019  PHURINE 5.5  GLUCOSEU NEGATIVE  HGBUR NEGATIVE  BILIRUBINUR NEGATIVE  KETONESUR NEGATIVE  PROTEINUR NEGATIVE  UROBILINOGEN 1.0  NITRITE NEGATIVE  LEUKOCYTESUR NEGATIVE   Studies/Results: Dg Chest 1 View  05/06/2011  *RADIOLOGY REPORT*  Clinical Data: Right chest  pain, chills, history COPD, hypertension, diabetes  CHEST - 1 VIEW  Comparison: 04/13/2011  Findings: Minimal enlargement of cardiac silhouette. Mediastinal contours and pulmonary vascularity grossly stable for technique. Right basilar infiltrate consistent with pneumonia. Questionable additional infiltrate peripherally in right upper lobe. No pleural effusion or pneumothorax. Bones unremarkable.  IMPRESSION: Right basilar infiltrate consistent with pneumonia.  Original Report Authenticated By: Burnetta Sabin, M.D.   Dg Hand Complete Left  05/06/2011  *RADIOLOGY REPORT*  Clinical Data: Bilateral hand pain and swelling.  LEFT HAND - COMPLETE 3+ VIEW  Comparison: Right hand films from the same day.  Findings: A similar appearance of degenerative change at the first [facility] joint is noted.  Age advanced degenerative changes are present in the DIP and PIP joints.  No significant erosions are present. Bone mineralization is within normal limits.  IMPRESSION:  1.  Degenerative changes compatible with osteoarthritis.  Original Report Authenticated By: Resa Miner. MATTERN, M.D.   Dg Hand Complete Right  05/06/2011  *RADIOLOGY REPORT*  Clinical Data: Bilateral hand pain and swelling.  RIGHT HAND - COMPLETE 3+ VIEW  Comparison: None.  Findings: Degenerative changes are present at the first [facility] joint. There is some loss of  joint space in the DIP greater than PIP joints.  No erosions are present.  Bone mineralization is within normal limits.  IMPRESSION:  1.  Mild osteoarthritic changes as described.  Original Report Authenticated By: Resa Miner. MATTERN, M.D.   Medications: I have reviewed the patient's current medications. Scheduled Meds:    . albuterol  2.5 mg Nebulization TID  . aspirin EC  81 mg Oral Daily  . azithromycin  500 mg Oral Daily  . cefTRIAXone (ROCEPHIN)  IV  1 g Intravenous Q24H  . enoxaparin  40 mg Subcutaneous Q24H  . famotidine  20 mg Oral Daily  . FLUoxetine  20 mg Oral Daily  . folic  acid  1 mg Oral Daily  . gabapentin  300 mg Oral TID  . insulin aspart  0-15 Units Subcutaneous TID WC  . LORazepam  0-4 mg Oral Q6H   Followed by  . LORazepam  0-4 mg Oral Q12H  . LORazepam  1 mg Oral Q8H  . nicotine  21 mg Transdermal Daily  . predniSONE  40 mg Oral Q breakfast  . simvastatin  40 mg Oral QHS  . thiamine  100 mg Oral Daily  . DISCONTD: albuterol  2.5 mg Nebulization BID  . DISCONTD: albuterol  2.5 mg Nebulization TID  . DISCONTD: methylPREDNISolone (SOLU-MEDROL) injection  125 mg Intravenous Q8H   Continuous Infusions:    . sodium chloride 100 mL/hr at 05/07/11 1248   PRN Meds:.acetaminophen, acetaminophen, albuterol, ALPRAZolam, HYDROcodone-acetaminophen, LORazepam, LORazepam, ondansetron (ZOFRAN) IV, ondansetron  Assessment/Plan: # Polyarthralgia & Chronic Pain Syndrome  ESR, CRP, RF all markedly elevated.  Combined with elevated anti-CCP from last [facility] visit, there is definite concern for RA or other rheumatic pathology.  Repeat CCP, ANA, and hepatitis panels all pending.  Hand xrays indicate OA only.  Still no joint edema amenable to aspiration.  Pt much improved after steroids yesterday.  Spoke with Dr. Trudie Reed of [facility] rheumatology, who agreed to see pt as outpatient.  Once pt is feeling better, we will move towards con't outpt w/u. - prednisone 61m PO today - f/u CCP, ANA, hepatitis panel - PT today - Pain control with Tylenol and Vicodin   # Elevated AlkPhos/GGT Elevated alk phot with concurrent elevation in GGT indicates possible hepatobiliary dz. Ddx would also include alcohol vs medications.  Hepatitis panel still pending, but other LFT's are wnl.  Unclear etiology, as pt has not described any abdominal or RUQ pain. - abdominal U/s - f/u hepatitis panel  # CAP with reactive airway dz Pt breathing con't to improve.  Afebrile, vitals stable, WBC trending down, wheezes decreasing on exam.  Likely CAP with component of RAD. - narrow to doxycycline -  Albuterol nebs scheduled  - steroids for polyarthralgia will likely help with RAD component   # Anxiety and depression  Patient was on 3 times daily dosing of Xanax, and also endorses drinking 3-5 beers daily, with his last drink was in the past 24 hours.  No s/s of withdrawal at this time. - CIWA  - Benzos when necessary for anxiety  - Continue fluoxetine  - UDS pending  #HTN  Holding antiHTN meds for soft BP's right now. Echo from last week was wnl, EF 55-60%.   # DM  A1c 6.1 on metformin only.  - hold metformin  - SSI while in hospital - steroids will increase CBG, can adjust SSI as needed  #Tobacco Abuse  - nicotine patch  - smoking cessation has been attempted with  the pt in the past, but consider again this admission   # DVT  - lovenox   LOS: 2 days   Dorthula Nettles [facility] 05/08/2011, 8:26 AM  Internal Medicine Teaching Service Attending Note Date: 05/08/2011  Patient name: Andrew Fowler  Medical record number: [id number]  Date of birth: [birth year]    This patient has been seen and discussed with Dr. Dorthula Nettles. Please see his note for complete details. I concur with his findings with the following additions/corrections: Andrew Fowler is doing significantly better since receiving steroids for his arthritis. He is able to move his arms above head and legs above the bed which he was unable to do on admit. We will decrease his prednisone dose to 20-50m daily and have the patient follow up with Dr. HTrudie Reedas an outpatient. He is getting RUQ to evaluate isolated elevated alp today. Anticipate discharge tomorrow.  SCarlyle Basques2/13/2013, 4:03 PM

## 2011-05-09 DIAGNOSIS — E119 Type 2 diabetes mellitus without complications: Secondary | ICD-10-CM

## 2011-05-09 DIAGNOSIS — G894 Chronic pain syndrome: Secondary | ICD-10-CM

## 2011-05-09 LAB — ANA: Anti Nuclear Antibody(ANA): NEGATIVE

## 2011-05-09 LAB — GLUCOSE, CAPILLARY
Glucose-Capillary: 88 mg/dL (ref 70–99)
Glucose-Capillary: 111 mg/dL — ABNORMAL HIGH (ref 70–99)

## 2011-05-09 LAB — CYCLIC CITRUL PEPTIDE ANTIBODY, IGG: Cyclic Citrullin Peptide Ab: 7.5 U/mL — ABNORMAL HIGH (ref 0.0–5.0)

## 2011-05-09 MED ORDER — ALBUTEROL SULFATE (5 MG/ML) 0.5% IN NEBU
2.5000 mg | INHALATION_SOLUTION | Freq: Two times a day (BID) | RESPIRATORY_TRACT | Status: DC
  Administered 2011-05-09 – 2011-05-10 (×2): 2.5 mg via RESPIRATORY_TRACT
  Filled 2011-05-09 (×2): qty 0.5

## 2011-05-09 NOTE — Progress Notes (Signed)
At 0138 pt's o2 sats were checked while the pt was resting and his sats ranged from 92-94% on RA. The pt refuses to wear his cpap. I will continue to monitor the pt. Hoover Brunette
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
## 2011-05-09 NOTE — Progress Notes (Signed)
Clinical Social Work-CSW completed full assessment-to follow in shadow chart-[facility] in shadow chart-pt Medicaid only-CSW in process of contacting [facility] and will request a Letter of Guarantee from Surveyor, quantity to aid with placement if needed.-CSW following for d/c- Gerre Scull, [phone]

## 2011-05-09 NOTE — Progress Notes (Addendum)
Subjective: No events overnight, pt still with pain/stiffness in joints  Objective: Vital signs in last 24 hours: Filed Vitals:   05/08/11 1609 05/08/11 2013 05/08/11 2130 05/09/11 0500  BP: 150/85  117/79 108/73  Pulse: 92  101 96  Temp: 97.5 F (36.4 C)  97.8 F (36.6 C) 98 F (36.7 C)  TempSrc:   Oral Oral  Resp: 18  20 20   Height:      Weight:      SpO2: 100% 98% 92%    Weight change:   Intake/Output Summary (Last 24 hours) at 05/09/11 1025 Last data filed at 05/09/11 0900  Gross per 24 hour  Intake    600 ml  Output    527 ml  Net     73 ml   Physical Exam: GEN: NAD.  Alert and oriented x 3.  Pleasant, conversant, and cooperative to exam. RESP:  Trace wheezes in b/l lower fields CARDIOVASCULAR: RRR, S1, S2, no m/r/g ABDOMEN: soft, NT/ND, NABS EXT: warm and dry. No edema in b/l LE MSK: improved ROM and strength in all joints b/l but still markedly diminished  Lab Results: Basic Metabolic Panel:  Lab 31/51/76 0532 05/06/11 1453  NA 133* 132*  K 4.3 4.8  CL 98 93*  CO2 26 24  GLUCOSE 80 79  BUN 19 34*  CREATININE 0.65 0.77  CALCIUM 10.3 10.7*  MG -- --  PHOS -- --   CBC:  Lab 05/08/11 0541 05/07/11 0532 05/06/11 1453  WBC 10.5 11.2* --  NEUTROABS -- -- 11.7*  HGB 12.3* 12.2* --  HCT 37.4* 37.2* --  MCV 88.4 90.1 --  PLT 423* 408* --   Cardiac Enzymes:  Lab 05/06/11 2116  CKTOTAL 190  CKMB 1.2  CKMBINDEX --  TROPONINI <0.30   CBG:  Lab 05/09/11 0744 05/08/11 2157 05/08/11 1643 05/08/11 1139 05/08/11 0748 05/07/11 2108  GLUCAP 88 106* 127* 128* 117* 157*   Hemoglobin A1C:  Lab 05/06/11 2115  HGBA1C 6.1*   Urinalysis:  Lab 05/06/11 1555  COLORURINE YELLOW  LABSPEC 1.019  PHURINE 5.5  GLUCOSEU NEGATIVE  HGBUR NEGATIVE  BILIRUBINUR NEGATIVE  KETONESUR NEGATIVE  PROTEINUR NEGATIVE  UROBILINOGEN 1.0  NITRITE NEGATIVE  LEUKOCYTESUR NEGATIVE   Studies/Results: US Abdomen Complete  05/08/2011  *RADIOLOGY REPORT*  Clinical Data:   Elevated LFT.  Morbid obesity.  Hypertension and diabetes  COMPLETE ABDOMINAL ULTRASOUND  Comparison:  Ultrasound 03/29/2011  Findings:  Gallbladder:  Limited visualization of the gallbladder. Negative for gallstones.  Negative sonographic Murphy's sign.  Gallbladder is contracted.  The wall is thickened at 3.9 mm however this is likely due to contraction of the gallbladder.  Common bile duct:  3.6 mm  Liver:  Limited visualization without focal abnormality.  IVC:  Limited evaluation without abnormality  Pancreas:  Limited evaluation without abnormality  Spleen:  Splenomegaly.  Splenic volume is 752 ml.  No focal lesion.  Right Kidney:  12.2 cm without obstruction  Left Kidney:  13.1 cm without obstruction  Abdominal aorta:  No aneurysm identified.  IMPRESSION: Contracted gallbladder without definite gallstones.  Splenomegaly  Ultrasound is limited in this obese patient.  Original Report Authenticated By: Truett Perna, M.D.   Medications: I have reviewed the patient's current medications. Scheduled Meds:    . albuterol  2.5 mg Nebulization TID  . aspirin EC  81 mg Oral Daily  . doxycycline  100 mg Oral Q12H  . enoxaparin  40 mg Subcutaneous Q24H  . famotidine  20  mg Oral Daily  . FLUoxetine  20 mg Oral Daily  . folic acid  1 mg Oral Daily  . gabapentin  300 mg Oral TID  . insulin aspart  0-15 Units Subcutaneous TID WC  . LORazepam  0-4 mg Oral Q6H   Followed by  . LORazepam  0-4 mg Oral Q12H  . LORazepam  1 mg Oral Q8H  . nicotine  21 mg Transdermal Daily  . predniSONE  40 mg Oral Q breakfast  . simvastatin  40 mg Oral QHS  . thiamine  100 mg Oral Daily  . DISCONTD: azithromycin  500 mg Oral Daily  . DISCONTD: cefTRIAXone (ROCEPHIN)  IV  1 g Intravenous Q24H   Continuous Infusions:   PRN Meds:.acetaminophen, acetaminophen, albuterol, ALPRAZolam, HYDROcodone-acetaminophen, LORazepam, LORazepam, ondansetron (ZOFRAN) IV, ondansetron  Assessment/Plan: # Polyarthralgia & Chronic Pain  Syndrome  ESR, CRP, RF all markedly elevated.  Still awaiting other labs. Combined with elevated anti-CCP from last [facility] visit, there is definite concern for RA or other rheumatic pathology.  Pt much improved after steroids, but still with substantial pain and mobility limitation. Pt has other OA and orthopedic issues and is markedly deconditioned, so his moderate response to steroids is not surprising.  Spoke with Dr. Trudie Reed of [facility] rheumatology, who agreed to see pt as outpatient.  PT recommends SNF, and pt is amenable.  Given degree of disbility still, this is an excellent option - prednisone 74m PO today, start 15-253mtomorrow - f/u CCP, ANA, hepatitis panel - PT today, OT today - Pain control with Tylenol and Vicodin - begin SNF arrangements with CSW  # Elevated AlkPhos/GGT Elevated alk phot with concurrent elevation in GGT indicates possible hepatobiliary dz. Abdominal [place] limited from obesity but unremarkable.  Ddx includes partial biliary obstruction, PSC, or infiltrative dz. Hepatitis panel still pending, but other LFT's are wnl. - anti-mitochondrial ab - will need repeat LFT's/GGT in 6 months, then consider liver imaging if silll elevated - f/u hepatitis panel  # CAP with reactive airway dz Pt breathing con't to improve.  Afebrile, vitals stable, WBC trending down, wheezes decreasing on exam.  Likely CAP with component of RAD. - con't doxycycline - Albuterol nebs scheduled bid - steroids for polyarthralgia will likely help with RAD component   # Anxiety and depression  Patient was on 3 times daily dosing of Xanax, and also endorses drinking 3-5 beers daily, with his last drink was in the past 24 hours.  No s/s of withdrawal at this time. - CIWA  - Benzos when necessary for anxiety  - Continue fluoxetine  - UDS pending  #HTN  Holding antiHTN meds for soft BP's right now. Echo from last week was wnl, EF 55-60%.   # DM  A1c 6.1 on metformin only.  - hold metformin  - SSI  while in hospital - steroids will increase CBG, can adjust SSI as needed  #Tobacco Abuse  - nicotine patch  - smoking cessation has been attempted with the pt in the past, but consider again this admission   # DVT  - lovenox   LOS: 3 days   WIDorthula NettlesBEN 05/09/2011, 10:25 AM  Internal Medicine Teaching Service Attending Note Date: 05/09/2011 Patient name: JiTHOMOS DOMINEMedical record number: [id number] of birth: 2/06-11-1960  This patient has been seen and discussed with Dr. wiDorthula NettlesPlease see his note for complete details. I concur with his findings with the following additions/corrections: patient's initial response to steroids  appears to be positive since he is able to move his arms and legs more freely than on admit. His isolated elevated ALP warrants further work-up, to include a RUQ ultrasound.  Elzie Rings [place] [facility] [phone]
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
## 2011-05-10 LAB — GLUCOSE, CAPILLARY: Glucose-Capillary: 93 mg/dL (ref 70–99)

## 2011-05-10 MED ORDER — PREDNISONE 20 MG PO TABS: 20.0000 mg | ORAL_TABLET | Freq: Every day | ORAL | Status: AC

## 2011-05-10 MED ORDER — NICOTINE 21 MG/24HR TD PT24: 1.0000 | MEDICATED_PATCH | Freq: Every day | TRANSDERMAL | Status: DC

## 2011-05-10 MED ORDER — IBUPROFEN 600 MG PO TABS
600.0000 mg | ORAL_TABLET | Freq: Four times a day (QID) | ORAL | Status: DC | PRN
  Administered 2011-05-10: 600 mg via ORAL
  Filled 2011-05-10 (×2): qty 1

## 2011-05-10 MED ORDER — HYDROCODONE-ACETAMINOPHEN 5-325 MG PO TABS: 1.0000 | ORAL_TABLET | Freq: Four times a day (QID) | ORAL | Status: DC | PRN

## 2011-05-10 MED ORDER — DOXYCYCLINE HYCLATE 100 MG PO TABS: 100.0000 mg | ORAL_TABLET | Freq: Two times a day (BID) | ORAL | Status: AC

## 2011-05-10 MED ORDER — ACETAMINOPHEN 325 MG PO TABS: 650.0000 mg | ORAL_TABLET | Freq: Four times a day (QID) | ORAL | Status: DC | PRN

## 2011-05-10 MED ORDER — IBUPROFEN 600 MG PO TABS: 600.0000 mg | ORAL_TABLET | Freq: Four times a day (QID) | ORAL | Status: AC | PRN

## 2011-05-10 NOTE — Progress Notes (Addendum)
Subjective: No events overnight, pt still with pain/stiffness in joints  Objective: Vital signs in last 24 hours: Filed Vitals:   05/09/11 1349 05/09/11 2100 05/09/11 2201 05/10/11 0500  BP: 149/88 112/72  119/69  Pulse: 88 72 83 88  Temp: 97.8 F (36.6 C) 97.9 F (36.6 C)  98.6 F (37 C)  TempSrc: Oral Oral  Oral  Resp: 18 18 18 18   Height:      Weight:      SpO2: 95% 93%  95%   Weight change:   Intake/Output Summary (Last 24 hours) at 05/10/11 1209 Last data filed at 05/10/11 0800  Gross per 24 hour  Intake    800 ml  Output    850 ml  Net    -50 ml   Physical Exam: GEN: NAD.  Alert and oriented x 3.  Pleasant, conversant, and cooperative to exam. RESP:  Trace wheezes in b/l lower fields CARDIOVASCULAR: RRR, S1, S2, no m/r/g ABDOMEN: soft, NT/ND, NABS EXT: warm and dry. No edema in b/l LE MSK: improved ROM and strength in all joints b/l but still markedly diminished  Lab Results: Basic Metabolic Panel:  Lab 53/29/92 0532 05/06/11 1453  NA 133* 132*  K 4.3 4.8  CL 98 93*  CO2 26 24  GLUCOSE 80 79  BUN 19 34*  CREATININE 0.65 0.77  CALCIUM 10.3 10.7*  MG -- --  PHOS -- --   CBC:  Lab 05/08/11 0541 05/07/11 0532 05/06/11 1453  WBC 10.5 11.2* --  NEUTROABS -- -- 11.7*  HGB 12.3* 12.2* --  HCT 37.4* 37.2* --  MCV 88.4 90.1 --  PLT 423* 408* --   Cardiac Enzymes:  Lab 05/06/11 2116  CKTOTAL 190  CKMB 1.2  CKMBINDEX --  TROPONINI <0.30   CBG:  Lab 05/10/11 1122 05/10/11 0758 05/09/11 2008 05/09/11 1654 05/09/11 1144 05/09/11 0744  GLUCAP 124* 93 201* 111* 126* 88   Hemoglobin A1C:  Lab 05/06/11 2115  HGBA1C 6.1*   Urinalysis:  Lab 05/06/11 1555  COLORURINE YELLOW  LABSPEC 1.019  PHURINE 5.5  GLUCOSEU NEGATIVE  HGBUR NEGATIVE  BILIRUBINUR NEGATIVE  KETONESUR NEGATIVE  PROTEINUR NEGATIVE  UROBILINOGEN 1.0  NITRITE NEGATIVE  LEUKOCYTESUR NEGATIVE   Studies/Results: US Abdomen Complete  05/08/2011  *RADIOLOGY REPORT*  Clinical  Data:  Elevated LFT.  Morbid obesity.  Hypertension and diabetes  COMPLETE ABDOMINAL ULTRASOUND  Comparison:  Ultrasound 03/29/2011  Findings:  Gallbladder:  Limited visualization of the gallbladder. Negative for gallstones.  Negative sonographic Murphy's sign.  Gallbladder is contracted.  The wall is thickened at 3.9 mm however this is likely due to contraction of the gallbladder.  Common bile duct:  3.6 mm  Liver:  Limited visualization without focal abnormality.  IVC:  Limited evaluation without abnormality  Pancreas:  Limited evaluation without abnormality  Spleen:  Splenomegaly.  Splenic volume is 752 ml.  No focal lesion.  Right Kidney:  12.2 cm without obstruction  Left Kidney:  13.1 cm without obstruction  Abdominal aorta:  No aneurysm identified.  IMPRESSION: Contracted gallbladder without definite gallstones.  Splenomegaly  Ultrasound is limited in this obese patient.  Original Report Authenticated By: Truett Perna, M.D.   Medications: I have reviewed the patient's current medications. Scheduled Meds:    . albuterol  2.5 mg Nebulization BID  . aspirin EC  81 mg Oral Daily  . doxycycline  100 mg Oral Q12H  . enoxaparin  40 mg Subcutaneous Q24H  . famotidine  20 mg  Oral Daily  . FLUoxetine  20 mg Oral Daily  . folic acid  1 mg Oral Daily  . gabapentin  300 mg Oral TID  . insulin aspart  0-15 Units Subcutaneous TID WC  . LORazepam  0-4 mg Oral Q12H  . LORazepam  1 mg Oral Q8H  . nicotine  21 mg Transdermal Daily  . predniSONE  40 mg Oral Q breakfast  . simvastatin  40 mg Oral QHS  . thiamine  100 mg Oral Daily   Continuous Infusions:   PRN Meds:.acetaminophen, acetaminophen, albuterol, ALPRAZolam, HYDROcodone-acetaminophen, ibuprofen, LORazepam, LORazepam, ondansetron (ZOFRAN) IV, ondansetron  Assessment/Plan: # Polyarthralgia & Chronic Pain Syndrome  ESR, CRP, RF, anti-CCP all elevated, ANA neg.  Definite concern for RA or other rheumatic pathology.  Pt much improved after  steroids, but still with substantial pain and mobility limitation. Pt has other OA and orthopedic issues and is markedly deconditioned, so his moderate response to steroids is not surprising.  Spoke with Dr. Trudie Reed of [facility] rheumatology, who agreed to see pt as outpatient.  PT recommends SNF, and pt is amenable.  Given degree of disbility still, this is an excellent option - prednisone 41m PO today, start 15-228mtomorrow - f/u hepatitis panel - PT today, OT today - Pain control with Tylenol and Vicodin - to SNF today  # Elevated AlkPhos/GGT Elevated alk phot with concurrent elevation in GGT indicates possible hepatobiliary dz. Abdominal [place] limited from obesity but unremarkable.  Ddx includes partial biliary obstruction, PSC, or infiltrative dz. Hepatitis panel still pending, but other LFT's are wnl. - anti-mitochondrial ab - will need repeat LFT's/GGT in 6 months, then consider liver imaging if silll elevated - f/u hepatitis panel  # CAP with reactive airway dz Pt breathing con't to improve.  Afebrile, vitals stable, WBC trending down, wheezes decreasing on exam.  Likely CAP with component of RAD. - con't doxycycline - Albuterol nebs scheduled bid - steroids for polyarthralgia will likely help with RAD component   # Anxiety and depression  Patient was on 3 times daily dosing of Xanax, and also endorses drinking 3-5 beers daily, with his last drink was in the past 24 hours.  No s/s of withdrawal at this time. - CIWA  - Benzos when necessary for anxiety  - Continue fluoxetine  - UDS pending  #HTN  Holding antiHTN meds for soft BP's right now. Echo from last week was wnl, EF 55-60%.   # DM  A1c 6.1 on metformin only.  - hold metformin  - SSI while in hospital - steroids will increase CBG, can adjust SSI as needed  #Tobacco Abuse  - nicotine patch  - smoking cessation has been attempted with the pt in the past, but consider again this admission   # DVT  - lovenox   LOS: 4  days   WIDorthula NettlesBEN 05/10/2011, 12:09 PM  Internal Medicine Teaching Service Attending Note Date: 05/10/2011  Patient name: Andrew IRVINGMedical record number: [id number] of birth: 04/1960-03-29  This patient has been seen and discussed with the house staff. Please see their note for complete details. I concur with their findings.  SNCarlyle Basques/15/2013, 1:36 PM

## 2011-05-10 NOTE — Discharge Summary (Signed)
Internal Medicine Teaching Service Attending Note Date: 05/10/2011  Patient name: Andrew Fowler  Medical record number: [id number]  Date of birth: 25-Jun-1960    This patient has been seen and discussed with Dr. Dorthula Nettles. Please see his discharge summary for complete details. I concur with his findings and recommendations. We will discharge Mr. Corona to SNF in order to receiving PT and OT for his polyarticular arthritis, presumably thought to be newly diagnosed RA. He is currently has just started prednisone 53m dialy for which he will continue on, until he sees rheumatologist, Dr. HTrudie Reedas an outpatient in roughly 4 wks.    SCarlyle Basques2/15/2013, 1:34 PM

## 2011-05-10 NOTE — Discharge Summary (Signed)
[facility] Discharge Note  Name: Andrew Fowler MRN: [id number] DOB: 04-Nov-1960 51 y.o.  Date of Admission: 05/06/2011 11:31 AM Date of Discharge: 05/10/2011 Attending Physician: Carlyle Basques, MD  Discharge Diagnosis:  Polyarthralgia concerning for RA  Community acquired PNA vs COPD exacerbation  DM  TOBACCO ABUSE  HYPERTENSION  Anxiety associated with depression  Chronic pain syndrome  Discharge Medications: Medication List  As of 05/10/2011 12:11 PM   TAKE these medications         acetaminophen 325 MG tablet   Commonly known as: TYLENOL   Take 2 tablets (650 mg total) by mouth every 6 (six) hours as needed (or Fever >/= 101).      ALPRAZolam 0.5 MG tablet   Commonly known as: XANAX   Take 0.5 mg by mouth 3 (three) times daily. For anxiety      BAYER LOW STRENGTH 81 MG EC tablet   Generic drug: aspirin   Take 81 mg by mouth daily.      doxycycline 100 MG tablet   Commonly known as: VIBRA-TABS   Take 1 tablet (100 mg total) by mouth every 12 (twelve) hours. For 3 more days      FLUoxetine 20 MG capsule   Commonly known as: PROZAC   Take 20 mg by mouth daily.      gabapentin 300 MG capsule   Commonly known as: NEURONTIN   Take 300 mg by mouth 3 (three) times daily.      HYDROcodone-acetaminophen 5-325 MG per tablet   Commonly known as: NORCO   Take 1-2 tablets by mouth every 6 (six) hours as needed.      ibuprofen 600 MG tablet   Commonly known as: ADVIL,MOTRIN   Take 1 tablet (600 mg total) by mouth every 6 (six) hours as needed for pain.      lisinopril 10 MG tablet   Commonly known as: PRINIVIL,ZESTRIL   Take 10 mg by mouth daily.      metFORMIN 500 MG tablet   Commonly known as: GLUCOPHAGE   Take 500 mg by mouth 2 (two) times daily with a meal.      nicotine 21 mg/24hr patch   Commonly known as: NICODERM CQ - dosed in mg/24 hours   Place 1 patch onto the skin daily. Your primary care doctor will have to lower the  doses of your patches over time.      predniSONE 20 MG tablet   Commonly known as: DELTASONE   Take 1 tablet (20 mg total) by mouth daily.      simvastatin 40 MG tablet   Commonly known as: ZOCOR   Take 40 mg by mouth at bedtime.      VENTOLIN HFA 108 (90 BASE) MCG/ACT inhaler   Generic drug: albuterol   Inhale 1-2 puffs into the lungs every 4 (four) hours as needed. shortness of breath            Disposition and follow-up:   [name] was [facility] from [facility] in Stable condition.    Follow-up Appointments: Follow-up Information    Follow up with Campbell Lerner, MD on 06/06/2011. (3:15)    Contact information:   [address] [place] [phone]       Follow up with Larey Dresser, MD on 05/21/2011. (9:45am)    Contact information:   [place] [phone]  Discharge Orders    Future Appointments: Provider: Department: Dept Phone: Center:   05/21/2011 9:45 AM Larey Dresser, MD [facility] [phone] [facility]     Future Orders Please Complete By Expires   Diet - low sodium heart healthy      Increase activity slowly        PMD: 1. Polyarthralgia Assess the patient's current level of joint pain and immobility. He is on steroids daily until he can see Dr. Trudie Reed.  Please remind him of the importance of this appointment on 3/14.  2. smoking cessation Patient currently on 21 mg nicotine patches, he will stay on it for 6 weeks and should be titrated down at the appropriate time.  3. Elevated Alk/Phos Please see description below, but pt will need f/u of hepatitis panel and anti-mitochondrial ab.  He will need repeat alk phos testing in roughly six months and to consider liver imaging if still elevated.  Consultations:  Dr. Trudie Reed of [facility] Rheumatology  Procedures Performed:  Dg Chest 1 View  05/06/2011  *RADIOLOGY  REPORT*  Clinical Data: Right chest pain, chills, history COPD, hypertension, diabetes  CHEST - 1 VIEW  Comparison: 04/13/2011  Findings: Minimal enlargement of cardiac silhouette. Mediastinal contours and pulmonary vascularity grossly stable for technique. Right basilar infiltrate consistent with pneumonia. Questionable additional infiltrate peripherally in right upper lobe. No pleural effusion or pneumothorax. Bones unremarkable.  IMPRESSION: Right basilar infiltrate consistent with pneumonia.  Original Report Authenticated By: Burnetta Sabin, M.D.   Dg Chest 2 View  04/13/2011  *RADIOLOGY REPORT*  Clinical Data: Shortness of breath.  CHEST - 2 VIEW  Comparison: 03/29/2011  Findings: Heart is borderline in size.  Mild vascular congestion. Low lung volumes.  No confluent opacities or effusions.  No edema.  IMPRESSION: Borderline heart size.  Vascular congestion.  Original Report Authenticated By: Raelyn Number, M.D.   Mr Cervical Spine Wo Contrast  04/29/2011  *RADIOLOGY REPORT*  Clinical Data: Chronic neck pain.  Bilateral extremity pain.  MRI CERVICAL SPINE WITHOUT CONTRAST  Technique:  Multiplanar and multiecho pulse sequences of the cervical spine, to include the craniocervical junction and cervicothoracic junction, were obtained according to standard protocol without intravenous contrast.  Comparison: MR cervical spine 08/17/2009.  Findings: Again seen is straightening of the normal cervical lordosis.  Vertebral body height, signal and alignment are maintained.  The craniocervical junction is normal and cervical cord signal is normal.  C2-3:  Minimal disc bulge without central canal or foraminal narrowing.  C3-4:  Mild disc bulge without central canal or foraminal narrowing.  C4-5:  Disc bulge eccentric left and uncovertebral disease on the left appear unchanged.  Central canal remains open.  Left foraminal narrowing is stable in appearance.  Right foramen is open.  C5-6:  There is some disc bulging  eccentric to the left and uncovertebral disease on the left.  Ventral thecal sac is effaced. Left foraminal narrowing is unchanged.  Right foramen open.  The appearance of this level is stable.  C6-7:  Mild disc bulge without central canal or foraminal narrowing.  C7-T1:  No change in a tiny central protrusion.  Central canal and foramina are open.  IMPRESSION: Unchanged examination of the cervical spine as detailed above.  As on the prior study, most notable finding is left foraminal narrowing at C4-5 and C5-6.  Original Report Authenticated By: Arvid Right. Luther Parody, M.D.   US Abdomen Complete  05/08/2011  *RADIOLOGY REPORT*  Clinical Data:  Elevated LFT.  Morbid  obesity.  Hypertension and diabetes  COMPLETE ABDOMINAL ULTRASOUND  Comparison:  Ultrasound 03/29/2011  Findings:  Gallbladder:  Limited visualization of the gallbladder. Negative for gallstones.  Negative sonographic Murphy's sign.  Gallbladder is contracted.  The wall is thickened at 3.9 mm however this is likely due to contraction of the gallbladder.  Common bile duct:  3.6 mm  Liver:  Limited visualization without focal abnormality.  IVC:  Limited evaluation without abnormality  Pancreas:  Limited evaluation without abnormality  Spleen:  Splenomegaly.  Splenic volume is 752 ml.  No focal lesion.  Right Kidney:  12.2 cm without obstruction  Left Kidney:  13.1 cm without obstruction  Abdominal aorta:  No aneurysm identified.  IMPRESSION: Contracted gallbladder without definite gallstones.  Splenomegaly  Ultrasound is limited in this obese patient.  Original Report Authenticated By: Truett Perna, M.D.   Dg Hand Complete Left  05/06/2011  *RADIOLOGY REPORT*  Clinical Data: Bilateral hand pain and swelling.  LEFT HAND - COMPLETE 3+ VIEW  Comparison: Right hand films from the same day.  Findings: A similar appearance of degenerative change at the first [facility] joint is noted.  Age advanced degenerative changes are present in the DIP and PIP joints.  No  significant erosions are present. Bone mineralization is within normal limits.  IMPRESSION:  1.  Degenerative changes compatible with osteoarthritis.  Original Report Authenticated By: Resa Miner. MATTERN, M.D.   Dg Hand Complete Right  05/06/2011  *RADIOLOGY REPORT*  Clinical Data: Bilateral hand pain and swelling.  RIGHT HAND - COMPLETE 3+ VIEW  Comparison: None.  Findings: Degenerative changes are present at the first [facility] joint. There is some loss of joint space in the DIP greater than PIP joints.  No erosions are present.  Bone mineralization is within normal limits.  IMPRESSION:  1.  Mild osteoarthritic changes as described.  Original Report Authenticated By: Resa Miner. MATTERN, M.D.    Admission HPI:  Patient is a 51 year old man with history of multiple joint pains, possible chronic pain syndrome, anxiety with associated depression, diabetes, possible gout, and hypertension who presents with continued polyarthralgia.   Patient was seen in [facility] twice in the month of January with continued pains in the bilateral hands, ankles, knees, and shoulders. At the first appointment he had a suspected gout flare and was prescribed colchicine with some improvement. At the second appointment, some labs were drawn that included an elevated ESR, CRP, and anti-CCP, and he was going to followup for hand x-rays. In addition, he has an orthopedist who has been working him up for cervical neck pain. He had a repeat MRI last week that showed no progression of foraminal narrowing at C4-5 and C5-6.   Since his last appointment, the patient has had worsening pains diffusely, and has now become weaker and more deconditioned. Over the past couple of days he has been unable to get out of bed, requiring assistance from family. He has urinated on himself as well as defecated in the bed.   He has additional complaints that include cough, chills, diarrhea, and right-sided chest pain is worse with inspiration. At baseline  he has a power wheelchair but over the past few weeks has essentially become bedridden.  Hospital Course by problem list: # Polyarthralgia & Chronic Pain Syndrome  Patient presented with declining function, disability, and immobility secondary to diffuse polyarthralgias. He had been undergoing an outpatient workup, but continued to worsen to the point where he could not even get out of bed to use  the bathroom.  ESR, CRP, RF, anti-CCP all elevated, ANA neg. Definite concern for RA or other rheumatic pathology given this spectrum of lab results.  No joint swelling enough for aspiration.  Dr. Trudie Reed of [facility] rheumatology was consulted via telephone. Per her advice, we started the patient on steroids, and he improved significantly. However, after steroids he still had substantial pain and mobility limitation, likely due to other OA and orthopedic issues along with marketed deconditioning. Patient began working with PT, who recommended SNF, and the patient was amenable. Constantly, he will be DC'd to SNF with outpatient f/u with Dr. Trudie Reed for further diagnostics and eventual therapeutics.  Per her approval, pt to remain and daily steroids at 12m until she sees him.   # Elevated AlkPhos/GGT  Patient noted to have an elevated alk phos with concurrent elevation in GGT but normal LFTs otherwise.  This indicated possible hepatobiliary dz. Abdominal U/S was limited from obesity but unremarkable. Ddx included partial biliary obstruction, PSC, or infiltrative dz. Hepatitis panel still pending at d/c.  Anti-mitochondrial ab was ordered, and pt will need repeat LFT's/GGT in 6 months, then consider liver imaging if silll elevated.  # CAP with reactive airway dz  Patient presented with cough, sputum production, mild leukocytosis, and chest x-ray indicative of possible pneumonia. Patient was started on ceftriaxone and azithromycin, and with quick clinical improvement was narrowed to doxycycline. He has a smoking history  and likely has a component of reactive airway disease, though he did not hear the formal diagnosis of COPD. He was started on nebulizers, and steroids for his polyarthralgia also likely contributed to his feeling better. Patient was breathing comfortably at discharge, with 3 further days of doxycycline as well as albuterol inhaler as needed.  # Anxiety and depression  Patient was continued on his 3 times a day dosing of Xanax, though he did not always use it. He was also continued on his fluoxetine.  #HTN  Blood pressures were initially soft, but the patient was restarted on his home dose of lisinopril at discharge.   # DM  A1c 6.1 on metformin only, CBGs were slightly elevated with steroids. If his subsequent A1c is elevated, I would not worry because of the steroids.  #Tobacco Abuse  Nicotine patch was provided and the patient can continue his smoking cessation with his PMD.  Discharge Vitals:  BP 119/69  Pulse 88  Temp(Src) 98.6 F (37 C) (Oral)  Resp 18  Ht 5' 10.5" (1.791 m)  Wt 281 lb 8 oz (127.688 kg)  BMI 39.82 kg/m2  SpO2 95%  Discharge Labs:  Results for orders placed during the hospital encounter of 05/06/11 (from the past 24 hour(s))  GLUCOSE, CAPILLARY     Status: Abnormal   Collection Time   05/09/11  4:54 PM      Component Value Range   Glucose-Capillary 111 (*) 70 - 99 (mg/dL)  GLUCOSE, CAPILLARY     Status: Abnormal   Collection Time   05/09/11  8:08 PM      Component Value Range   Glucose-Capillary 201 (*) 70 - 99 (mg/dL)   Comment 1 Notify RN    GLUCOSE, CAPILLARY     Status: Normal   Collection Time   05/10/11  7:58 AM      Component Value Range   Glucose-Capillary 93  70 - 99 (mg/dL)  GLUCOSE, CAPILLARY     Status: Abnormal   Collection Time   05/10/11 11:22 AM      Component  Value Range   Glucose-Capillary 124 (*) 70 - 99 (mg/dL)    Signed: Dorthula Nettles, BEN 05/10/2011, 12:11 PM

## 2011-05-10 NOTE — Progress Notes (Signed)
Clinical Social Work-CSW obtained bed offer from [facility]- has notified treatment team and will notify pt-CSW will facilitate d/c to SNF as soon as d/c paperwork prepared-Abdoul Encinas-MSW, [phone]

## 2011-05-10 NOTE — Progress Notes (Signed)
PT Cancellation Note  Treatment cancelled today due to pt reports not sleeping well last night and for d/c to SNF today.  Prefers to try and get some rest and save energy for transfer.  Pt appears motivated to increase mobility as able.Alford Highland, Katina Dung 05/10/2011, 11:21 AM  Narda Bonds, PTA [phone]

## 2011-05-13 LAB — HEPATITIS PANEL, ACUTE
Hep A IgM: NEGATIVE
Hep B C IgM: POSITIVE — AB

## 2011-05-17 ENCOUNTER — Encounter: Payer: Self-pay | Admitting: Internal Medicine

## 2011-05-17 DIAGNOSIS — R7989 Other specified abnormal findings of blood chemistry: Secondary | ICD-10-CM

## 2011-05-17 DIAGNOSIS — M069 Rheumatoid arthritis, unspecified: Secondary | ICD-10-CM | POA: Insufficient documentation

## 2011-05-17 DIAGNOSIS — K7581 Nonalcoholic steatohepatitis (NASH): Secondary | ICD-10-CM | POA: Insufficient documentation

## 2011-05-17 HISTORY — DX: Other specified abnormal findings of blood chemistry: R79.89

## 2011-05-17 HISTORY — DX: Nonalcoholic steatohepatitis (NASH): K75.81

## 2011-05-21 ENCOUNTER — Ambulatory Visit (INDEPENDENT_AMBULATORY_CARE_PROVIDER_SITE_OTHER): Payer: Medicaid Other | Admitting: Internal Medicine

## 2011-05-21 ENCOUNTER — Encounter: Payer: Self-pay | Admitting: Internal Medicine

## 2011-05-21 VITALS — BP 105/71 | HR 100 | Temp 97.0°F | Wt 295.7 lb

## 2011-05-21 DIAGNOSIS — G894 Chronic pain syndrome: Secondary | ICD-10-CM

## 2011-05-21 DIAGNOSIS — F341 Dysthymic disorder: Secondary | ICD-10-CM

## 2011-05-21 DIAGNOSIS — M109 Gout, unspecified: Secondary | ICD-10-CM

## 2011-05-21 DIAGNOSIS — I10 Essential (primary) hypertension: Secondary | ICD-10-CM

## 2011-05-21 DIAGNOSIS — J449 Chronic obstructive pulmonary disease, unspecified: Secondary | ICD-10-CM

## 2011-05-21 DIAGNOSIS — Z0000 Encounter for general adult medical examination without abnormal findings: Secondary | ICD-10-CM

## 2011-05-21 DIAGNOSIS — R609 Edema, unspecified: Secondary | ICD-10-CM

## 2011-05-21 DIAGNOSIS — E785 Hyperlipidemia, unspecified: Secondary | ICD-10-CM

## 2011-05-21 DIAGNOSIS — M069 Rheumatoid arthritis, unspecified: Secondary | ICD-10-CM

## 2011-05-21 DIAGNOSIS — E119 Type 2 diabetes mellitus without complications: Secondary | ICD-10-CM

## 2011-05-21 DIAGNOSIS — G4733 Obstructive sleep apnea (adult) (pediatric): Secondary | ICD-10-CM

## 2011-05-21 DIAGNOSIS — F418 Other specified anxiety disorders: Secondary | ICD-10-CM

## 2011-05-21 DIAGNOSIS — F17200 Nicotine dependence, unspecified, uncomplicated: Secondary | ICD-10-CM

## 2011-05-21 MED ORDER — SIMVASTATIN 40 MG PO TABS: 40.0000 mg | ORAL_TABLET | Freq: Every day | ORAL | Status: DC

## 2011-05-21 MED ORDER — HYDROCODONE-ACETAMINOPHEN 5-325 MG PO TABS: 1.0000 | ORAL_TABLET | Freq: Four times a day (QID) | ORAL | Status: DC | PRN

## 2011-05-21 MED ORDER — ALPRAZOLAM 0.5 MG PO TABS: 0.5000 mg | ORAL_TABLET | Freq: Three times a day (TID) | ORAL | Status: DC

## 2011-05-21 NOTE — Patient Instructions (Signed)
Do not eat prior prior to your next appt. No ibuprofen, tylenol. See Dr Trudie Reed. See me in 2-3 months.

## 2011-05-21 NOTE — Assessment & Plan Note (Signed)
Pitting edema to knees B. W/U negative. Likely 2/2 venous insuff 2/2 weight. Prednisone might also exacerbate this condition. No intervention.

## 2011-05-21 NOTE — Progress Notes (Signed)
  Subjective:    Patient ID: Andrew Fowler, male    DOB: [birth year], 51 y.o.   MRN: [id number]  HPI  Please see the A&P for the status of the pt's chronic medical problems.   Review of Systems  Constitutional: Positive for appetite change. Negative for unexpected weight change.  HENT: Positive for rhinorrhea. Negative for ear pain.   Eyes: Negative for itching and visual disturbance.  Respiratory: Negative for cough and shortness of breath.   Cardiovascular: Positive for leg swelling. Negative for chest pain.  Gastrointestinal: Negative for nausea, diarrhea, constipation and blood in stool.  Genitourinary: Negative for urgency and hematuria.  Musculoskeletal: Positive for joint swelling, arthralgias and gait problem.  Skin: Positive for wound. Negative for rash.  Neurological: Negative for dizziness and headaches.  Psychiatric/Behavioral: Positive for agitation. Negative for sleep disturbance. The patient is nervous/anxious.        Objective:   Physical Exam  Constitutional: He is oriented to person, place, and time. He appears well-developed and well-nourished. He is cooperative. No distress.       Obese In WC  HENT:  Head: Normocephalic and atraumatic.  Right Ear: External ear normal.  Left Ear: External ear normal.  Nose: Nose normal.  Eyes: Conjunctivae are normal.  Musculoskeletal: He exhibits edema. He exhibits no tenderness.       +1 edema to knee B  Neurological: He is alert and oriented to person, place, and time.  Skin: Skin is warm and dry. He is not diaphoretic.       Left side lateral of foot, well healing lesion, 5 mm, scabbed, no erythema, exudate, tenderness  Psychiatric: He has a normal mood and affect. His behavior is normal. Judgment normal.          Assessment & Plan:

## 2011-05-22 ENCOUNTER — Encounter: Payer: Self-pay | Admitting: Internal Medicine

## 2011-05-22 DIAGNOSIS — Z0000 Encounter for general adult medical examination without abnormal findings: Secondary | ICD-10-CM | POA: Insufficient documentation

## 2011-05-22 NOTE — Assessment & Plan Note (Signed)
He does not use CPAP. He was getting the sxs of OSA confused. Education today. Is willing to redo sleep study after more pressing issues are addressed.

## 2011-05-22 NOTE — Assessment & Plan Note (Signed)
Will need to discuss colonoscopy. To get optho exam before next appt.

## 2011-05-22 NOTE — Assessment & Plan Note (Signed)
At goal with only Lisinopril 10 mg QD.

## 2011-05-22 NOTE — Assessment & Plan Note (Addendum)
He has an appt to see Rheum early March. He will remain on prednisone until the eval. He knows the side effects of prednisone and that the goal is to get him off of it.   He still has trouble with his hands - he cannot close them all the way. That makes it difficult for him to stand up as he needs to use his hands to grip bar to help him stand and he cannot grip. Has to has assisstance to stand. Able to walk I. Starts out dragging feet but then gets "loosened" up and able to walk normally. Steady on feet. No falls.   On hydrocodone. Takes 2 at [place], 2 at 4 PM, and 2 at 9PM. One pill doesn't work. Does last until the next dose. I am hoping the opioids are not chronic and once he gets on a biologic agent, he can taper off the opioid.

## 2011-05-22 NOTE — Assessment & Plan Note (Addendum)
He is very well controlled on his metformin 500 mg BID. His goal is to get his optho exam before I see him again. Otherwise, he is UTD on screening. He checks his CBG 1-3 times weekly.

## 2011-05-22 NOTE — Assessment & Plan Note (Signed)
Not ready to quit.

## 2011-05-22 NOTE — Assessment & Plan Note (Signed)
This is probable not an accurate dx. He uses the alb maybe once every 6 months. He still smokes - restarted right after he came out of the SNF. He never has had PFT's. We will discuss PFT's in future once other issues are stable.

## 2011-05-22 NOTE — Assessment & Plan Note (Signed)
He denies any recent gout flares which makes sense as he is on prednisone.

## 2011-05-22 NOTE — Assessment & Plan Note (Signed)
At goal with a statin. Not fasting today. Will come fasting next appt to check lipid panel.

## 2011-05-22 NOTE — Assessment & Plan Note (Signed)
I reviewed his weight curve. Overall slow drop in weight until prednisone started and slight increase in weight. We went over his ideal weight.

## 2011-05-22 NOTE — Assessment & Plan Note (Signed)
His last dose of Xanax was last PM. He does feel he needs this - seems to have some social anxiety - hates to be in crowds or have a lot of people in home. Takes one before goes to grocery store, one QAM, and one before bed bc helps him to sleep. He cont to take an SSRI. He knows that in the future we will be talking about whether we can wean off, change to long acting med, refer the mental health, etc. He is willing to discuss in future.

## 2011-05-22 NOTE — Assessment & Plan Note (Addendum)
MRI of cervical neck was basically unchanged. There was unchanged left foraminal narrowing at C4-5 and C5-6. There did not seem to be overt nerve impingment. His pain is mostly on the left. He has seen Dr Sharol Given who has rec that no intervention is required. He will continue on Gabapentin. The goal would be to get him off opioids.   The pain in the back of his knees has eased off.   [facility] is getting him a hospital bed, walker, and ankle brace. I do not know the necessity of the brace. Supposed to have PT and an aide but they have not started yet.  Please see RA for more pain details.

## 2011-05-28 ENCOUNTER — Ambulatory Visit (INDEPENDENT_AMBULATORY_CARE_PROVIDER_SITE_OTHER): Payer: Medicaid Other | Admitting: Internal Medicine

## 2011-05-28 ENCOUNTER — Encounter: Payer: Self-pay | Admitting: Internal Medicine

## 2011-05-28 VITALS — BP 116/81 | HR 108 | Temp 96.6°F | Ht 70.5 in | Wt 284.0 lb

## 2011-05-28 DIAGNOSIS — Z23 Encounter for immunization: Secondary | ICD-10-CM

## 2011-05-28 DIAGNOSIS — B169 Acute hepatitis B without delta-agent and without hepatic coma: Secondary | ICD-10-CM

## 2011-05-28 DIAGNOSIS — B1910 Unspecified viral hepatitis B without hepatic coma: Secondary | ICD-10-CM

## 2011-05-28 DIAGNOSIS — E119 Type 2 diabetes mellitus without complications: Secondary | ICD-10-CM

## 2011-05-28 LAB — HEPATIC FUNCTION PANEL
ALT: 161 U/L — ABNORMAL HIGH (ref 0–53)
Alkaline Phosphatase: 531 U/L — ABNORMAL HIGH (ref 39–117)
Bilirubin, Direct: 0.3 mg/dL (ref 0.0–0.3)
Indirect Bilirubin: 0.5 mg/dL (ref 0.0–0.9)

## 2011-05-28 NOTE — Patient Instructions (Signed)
It was nice to meet you, Andrew Fowler. We are not sure of how you were exposed to hepatitis. We are checking more blood work and would like you to keep your scheduled appointments. You will receive the Hepatitis A vaccination today.

## 2011-05-29 ENCOUNTER — Telehealth: Payer: Self-pay | Admitting: Dietician

## 2011-05-29 ENCOUNTER — Encounter: Payer: Self-pay | Admitting: Dietician

## 2011-05-29 LAB — HEPATITIS B SURFACE ANTIBODY,QUALITATIVE: Hep B S Ab: NEGATIVE

## 2011-05-29 LAB — HEPATITIS B CORE ANTIBODY, TOTAL: Hep B Core Total Ab: NEGATIVE

## 2011-05-29 NOTE — Progress Notes (Signed)
Subjective:     Patient ID: Andrew Fowler, male   DOB: May 14, 1960, 51 y.o.   MRN: [id number]  HPI  Presents per Dr. Trudie Reed with "abnormal lab tests" after referral for rheum arthritis.  Pt with recent Hepatitis panel positive for IgM Hep B core, Alkaline phosphatase of 632, normal AST 31 and ALT 39 and elevated GGT of 407. All other hepatitis indices are negative including hepatitis A IgM, hepatitis C virus Ab, and Hep B surface Ag. Of note prior hep panel 2 years ago was negative. He denies weakness, fatigue, loss of appetite, nausea no vomiting, no abdominal pain, no jaundice, no dark urine or light stools, no itching. He denies sexual intercourse in over a year and reports that he has not ever done IV drugs. He reports that his drug of choice was crack cocaine of which the last time he has used was 1 year ago. Of note, he had an abdominal ultrasound 05/08/11 which demonstrated no acute findings and contracted gallbladder without definite gallstones.   Review of Systems  Constitutional: Negative for fever, chills, activity change and unexpected weight change.  Eyes: Negative for visual disturbance.  Cardiovascular: Negative for chest pain.  Gastrointestinal: Negative for abdominal distention.  Genitourinary: Negative for dysuria.  Musculoskeletal:       Reports bilateral shin pain  Neurological: Negative for weakness.  Hematological: Does not bruise/bleed easily.    Objective:   Physical Exam  Constitutional: He is oriented to person, place, and time. He appears well-developed and well-nourished. No distress.       Obese, in wheelchair with brother present  HENT:  Head: Normocephalic and atraumatic.  Eyes: Conjunctivae and EOM are normal. Pupils are equal, round, and reactive to light. No scleral icterus.  Neck: Normal range of motion. Neck supple.  Cardiovascular: Normal rate, regular rhythm, normal heart sounds and intact distal pulses.   Pulmonary/Chest: Effort normal and breath  sounds normal.  Abdominal: Soft. Bowel sounds are normal. He exhibits no mass. There is no tenderness.       obese  Musculoskeletal: Normal range of motion. He exhibits no edema.  Lymphadenopathy:    He has no cervical adenopathy.  Neurological: He is alert and oriented to person, place, and time.  Skin: Skin is warm and dry.       Assessment:     1. acute hepatitis B: window phase, Indicated by positive hepatitis B core IgM    Plan:     Will check anti-hepatitis B surface antibody  Check IgG anti-hepatitis B core  Re-check liver panel   Will give hepatitis A vaccine today  Will followup with his PCP for further evaluation as scheduled. At that time he should also be queried about any recent tattoos or body piercing

## 2011-05-29 NOTE — Telephone Encounter (Signed)
Patient says he is not ready at this time, but agrees for [place] to ask him again in 6 months.  Also counseled patient over phone on 1500 mg sodium per day.

## 2011-06-04 ENCOUNTER — Encounter: Payer: Medicaid Other | Admitting: Internal Medicine

## 2011-06-06 ENCOUNTER — Telehealth: Payer: Self-pay | Admitting: *Deleted

## 2011-06-06 ENCOUNTER — Telehealth: Payer: Self-pay | Admitting: Dietician

## 2011-06-06 DIAGNOSIS — B169 Acute hepatitis B without delta-agent and without hepatic coma: Secondary | ICD-10-CM

## 2011-06-06 NOTE — Telephone Encounter (Signed)
Dr schooler, Pt would like for you to call him and discuss his lab results, please use the ph# listed in chart. Thank you, h.

## 2011-06-06 NOTE — Telephone Encounter (Signed)
Returned patient call: he asks that we schedule an eye exam for an afternoon after 06/12/11. This  was scheduled for 07/02/11 at 2:40 PM and letter sent to inform patient. He also wants to know if Medicaid covers smokeless cigarrettes because the patch doe snot seem to be working? He has an appointment with Dr. Lynnae January 3/19 and will discuss with her at that time.

## 2011-06-11 NOTE — Telephone Encounter (Signed)
Called 12:06 and busy. Will try back.

## 2011-06-11 NOTE — Telephone Encounter (Signed)
Reached pt at [facility] for confusion this AM. He knows he has Hep B. Explained that LFT's are still elevated and we need to follow them. Will put in referral to [facility]. C/O ankle pain. Has appt March 21st Dr Michail Sermon.

## 2011-06-13 ENCOUNTER — Inpatient Hospital Stay (HOSPITAL_COMMUNITY)
Admission: AD | Admit: 2011-06-13 | Discharge: 2011-06-14 | DRG: 315 | Disposition: A | Discharge status: Home / Self Care | Payer: Medicaid Other | Source: Ambulatory Visit | Attending: Internal Medicine | Admitting: Internal Medicine

## 2011-06-13 ENCOUNTER — Ambulatory Visit (HOSPITAL_COMMUNITY)
Admission: RE | Admit: 2011-06-13 | Discharge: 2011-06-13 | Disposition: A | Discharge status: Home / Self Care | Payer: Medicaid Other | Source: Ambulatory Visit | Attending: Internal Medicine | Admitting: Internal Medicine

## 2011-06-13 ENCOUNTER — Ambulatory Visit (HOSPITAL_COMMUNITY)
Admission: RE | Admit: 2011-06-13 | Discharge: 2011-06-13 | Disposition: A | Discharge status: Home / Self Care | Payer: Medicaid Other | Source: Ambulatory Visit

## 2011-06-13 ENCOUNTER — Ambulatory Visit (INDEPENDENT_AMBULATORY_CARE_PROVIDER_SITE_OTHER): Payer: Medicaid Other | Admitting: Internal Medicine

## 2011-06-13 ENCOUNTER — Encounter (HOSPITAL_COMMUNITY): Payer: Self-pay | Admitting: General Practice

## 2011-06-13 VITALS — BP 88/48 | HR 91 | Temp 95.7°F | Ht 70.5 in | Wt 283.0 lb

## 2011-06-13 DIAGNOSIS — R7401 Elevation of levels of liver transaminase levels: Secondary | ICD-10-CM | POA: Diagnosis present

## 2011-06-13 DIAGNOSIS — I959 Hypotension, unspecified: Secondary | ICD-10-CM

## 2011-06-13 DIAGNOSIS — Z6841 Body Mass Index (BMI) 40.0 and over, adult: Secondary | ICD-10-CM

## 2011-06-13 DIAGNOSIS — G894 Chronic pain syndrome: Secondary | ICD-10-CM | POA: Diagnosis present

## 2011-06-13 DIAGNOSIS — J449 Chronic obstructive pulmonary disease, unspecified: Secondary | ICD-10-CM | POA: Diagnosis present

## 2011-06-13 DIAGNOSIS — R55 Syncope and collapse: Secondary | ICD-10-CM | POA: Diagnosis present

## 2011-06-13 DIAGNOSIS — I872 Venous insufficiency (chronic) (peripheral): Secondary | ICD-10-CM | POA: Diagnosis present

## 2011-06-13 DIAGNOSIS — R110 Nausea: Secondary | ICD-10-CM

## 2011-06-13 DIAGNOSIS — M069 Rheumatoid arthritis, unspecified: Secondary | ICD-10-CM | POA: Diagnosis present

## 2011-06-13 DIAGNOSIS — IMO0002 Reserved for concepts with insufficient information to code with codable children: Secondary | ICD-10-CM

## 2011-06-13 DIAGNOSIS — J4489 Other specified chronic obstructive pulmonary disease: Secondary | ICD-10-CM | POA: Diagnosis present

## 2011-06-13 DIAGNOSIS — Z01812 Encounter for preprocedural laboratory examination: Secondary | ICD-10-CM

## 2011-06-13 DIAGNOSIS — B1910 Unspecified viral hepatitis B without hepatic coma: Secondary | ICD-10-CM | POA: Diagnosis present

## 2011-06-13 DIAGNOSIS — R1110 Vomiting, unspecified: Secondary | ICD-10-CM

## 2011-06-13 DIAGNOSIS — R112 Nausea with vomiting, unspecified: Secondary | ICD-10-CM

## 2011-06-13 DIAGNOSIS — E785 Hyperlipidemia, unspecified: Secondary | ICD-10-CM | POA: Diagnosis present

## 2011-06-13 DIAGNOSIS — R7402 Elevation of levels of lactic acid dehydrogenase (LDH): Secondary | ICD-10-CM | POA: Diagnosis present

## 2011-06-13 DIAGNOSIS — E119 Type 2 diabetes mellitus without complications: Secondary | ICD-10-CM

## 2011-06-13 DIAGNOSIS — I10 Essential (primary) hypertension: Secondary | ICD-10-CM | POA: Diagnosis present

## 2011-06-13 DIAGNOSIS — F341 Dysthymic disorder: Secondary | ICD-10-CM | POA: Diagnosis present

## 2011-06-13 DIAGNOSIS — Z96649 Presence of unspecified artificial hip joint: Secondary | ICD-10-CM

## 2011-06-13 DIAGNOSIS — L97309 Non-pressure chronic ulcer of unspecified ankle with unspecified severity: Secondary | ICD-10-CM | POA: Diagnosis present

## 2011-06-13 DIAGNOSIS — G4733 Obstructive sleep apnea (adult) (pediatric): Secondary | ICD-10-CM | POA: Diagnosis present

## 2011-06-13 DIAGNOSIS — F17200 Nicotine dependence, unspecified, uncomplicated: Secondary | ICD-10-CM | POA: Diagnosis present

## 2011-06-13 HISTORY — DX: Shortness of breath: R06.02

## 2011-06-13 LAB — URINALYSIS, ROUTINE W REFLEX MICROSCOPIC
Glucose, UA: NEGATIVE mg/dL
Leukocytes, UA: NEGATIVE
Nitrite: NEGATIVE
Specific Gravity, Urine: 1.01 (ref 1.005–1.030)
pH: 6 (ref 5.0–8.0)

## 2011-06-13 LAB — COMPREHENSIVE METABOLIC PANEL
Albumin: 3.2 g/dL — ABNORMAL LOW (ref 3.5–5.2)
BUN: 28 mg/dL — ABNORMAL HIGH (ref 6–23)
Calcium: 9.8 mg/dL (ref 8.4–10.5)
Chloride: 94 mEq/L — ABNORMAL LOW (ref 96–112)
Glucose, Bld: 111 mg/dL — ABNORMAL HIGH (ref 70–99)
Potassium: 4.6 mEq/L (ref 3.5–5.3)

## 2011-06-13 LAB — GLUCOSE, CAPILLARY
Glucose-Capillary: 93 mg/dL (ref 70–99)
Glucose-Capillary: 214 mg/dL — ABNORMAL HIGH (ref 70–99)

## 2011-06-13 LAB — CARDIAC PANEL(CRET KIN+CKTOT+MB+TROPI)
Relative Index: INVALID (ref 0.0–2.5)
Total CK: 20 U/L (ref 7–232)
Troponin I: <0.3 ng/mL (ref <0.30)

## 2011-06-13 LAB — CBC
Hemoglobin: 14.5 g/dL (ref 13.0–17.0)
RBC: 4.99 MIL/uL (ref 4.22–5.81)

## 2011-06-13 LAB — PROTIME-INR: INR: 0.97 (ref 0.00–1.49)

## 2011-06-13 LAB — LIPASE: Lipase: 81 U/L — ABNORMAL HIGH (ref 11–59)

## 2011-06-13 MED ORDER — GABAPENTIN 300 MG PO CAPS
600.0000 mg | ORAL_CAPSULE | Freq: Three times a day (TID) | ORAL | Status: DC
  Administered 2011-06-13 – 2011-06-14 (×2): 600 mg via ORAL
  Filled 2011-06-13 (×4): qty 2

## 2011-06-13 MED ORDER — SIMVASTATIN 40 MG PO TABS
40.0000 mg | ORAL_TABLET | Freq: Every day | ORAL | Status: DC
  Administered 2011-06-13: 21:00:00 40 mg via ORAL
  Filled 2011-06-13 (×2): qty 1

## 2011-06-13 MED ORDER — PREDNISONE 20 MG PO TABS
60.0000 mg | ORAL_TABLET | Freq: Once | ORAL | Status: AC
  Administered 2011-06-13: 60 mg via ORAL

## 2011-06-13 MED ORDER — SODIUM CHLORIDE 0.9 % IV BOLUS (SEPSIS)
2000.0000 mL | Freq: Once | INTRAVENOUS | Status: AC
  Administered 2011-06-13: 18:00:00 2000 mL via INTRAVENOUS

## 2011-06-13 MED ORDER — ENOXAPARIN SODIUM 40 MG/0.4ML ~~LOC~~ SOLN
40.0000 mg | SUBCUTANEOUS | Status: DC
  Administered 2011-06-13: 21:00:00 40 mg via SUBCUTANEOUS
  Filled 2011-06-13 (×2): qty 0.4

## 2011-06-13 MED ORDER — ACETAMINOPHEN 325 MG PO TABS: 650.0000 mg | ORAL_TABLET | Freq: Four times a day (QID) | ORAL | Status: DC | PRN

## 2011-06-13 MED ORDER — FLUOXETINE HCL 20 MG PO CAPS
20.0000 mg | ORAL_CAPSULE | Freq: Every day | ORAL | Status: DC
  Administered 2011-06-14: 10:00:00 20 mg via ORAL
  Filled 2011-06-13: qty 1

## 2011-06-13 MED ORDER — SODIUM CHLORIDE 0.9 % IV SOLN
INTRAVENOUS | Status: DC
  Administered 2011-06-13 – 2011-06-14 (×3): via INTRAVENOUS

## 2011-06-13 MED ORDER — INSULIN ASPART 100 UNIT/ML ~~LOC~~ SOLN
0.0000 [IU] | Freq: Three times a day (TID) | SUBCUTANEOUS | Status: DC
  Administered 2011-06-14: 08:00:00 2 [IU] via SUBCUTANEOUS

## 2011-06-13 MED ORDER — ALBUTEROL SULFATE (5 MG/ML) 0.5% IN NEBU: 2.5000 mg | INHALATION_SOLUTION | Freq: Four times a day (QID) | RESPIRATORY_TRACT | Status: DC | PRN

## 2011-06-13 MED ORDER — METFORMIN HCL 500 MG PO TABS
500.0000 mg | ORAL_TABLET | Freq: Two times a day (BID) | ORAL | Status: DC
  Filled 2011-06-13 (×2): qty 1

## 2011-06-13 MED ORDER — PREDNISONE 50 MG PO TABS
60.0000 mg | ORAL_TABLET | Freq: Every day | ORAL | Status: DC
  Filled 2011-06-13: qty 1

## 2011-06-13 MED ORDER — ASPIRIN EC 81 MG PO TBEC
81.0000 mg | DELAYED_RELEASE_TABLET | Freq: Every day | ORAL | Status: DC
  Administered 2011-06-14: 10:00:00 81 mg via ORAL
  Filled 2011-06-13: qty 1

## 2011-06-13 MED ORDER — ALBUTEROL SULFATE HFA 108 (90 BASE) MCG/ACT IN AERS
1.0000 | INHALATION_SPRAY | RESPIRATORY_TRACT | Status: DC | PRN
  Filled 2011-06-13: qty 6.7

## 2011-06-13 MED ORDER — PROMETHAZINE HCL 25 MG PO TABS: 25.0000 mg | ORAL_TABLET | Freq: Four times a day (QID) | ORAL | Status: DC | PRN

## 2011-06-13 MED ORDER — METHYLPREDNISOLONE SODIUM SUCC 125 MG IJ SOLR
125.0000 mg | Freq: Three times a day (TID) | INTRAMUSCULAR | Status: DC
  Administered 2011-06-13 – 2011-06-14 (×2): 125 mg via INTRAVENOUS
  Filled 2011-06-13 (×3): qty 2

## 2011-06-13 MED ORDER — ASPIRIN 81 MG PO TBEC
81.0000 mg | DELAYED_RELEASE_TABLET | Freq: Every day | ORAL | Status: DC
Start: 2011-06-13 — End: 2011-06-13

## 2011-06-13 MED ORDER — PREDNISONE 20 MG PO TABS: 20.0000 mg | ORAL_TABLET | Freq: Every day | ORAL | Status: DC

## 2011-06-13 MED ORDER — SODIUM CHLORIDE 0.9 % IV BOLUS (SEPSIS): 1000.0000 mL | Freq: Once | INTRAVENOUS | Status: DC

## 2011-06-13 NOTE — Progress Notes (Signed)
Subjective:     Fowler ID: Andrew Fowler, male   DOB: 03/09/1961, 51 y.o.   MRN: [id number]  HPI Andrew Fowler is a 51 year old male with history significant for recent diagnosis of acute hepatitis B, diabetes, hypertension and anxiety associated with depression. Returns for followup visit following his recent diagnosis of Hep B. dictation during blood pressure measurement he became acutely lightheaded, nauseous, and vomited yellow emesis x1. Initial blood pressure reading was in Andrew 31Y systolic. Reports Andrew Fowler was in his normal state of health earlier today without specific complaints other than ankle tenderness and feeling thirsty.  Review of Systems  Constitutional: Negative for fever and fatigue.  HENT: Negative for congestion.   Eyes: Negative for visual disturbance.  Respiratory: Negative for cough and shortness of breath.   Cardiovascular: Positive for chest pain. Negative for palpitations.  Genitourinary: Negative for dysuria and hematuria.  Musculoskeletal: Positive for joint swelling.  Neurological: Negative for weakness, numbness and headaches.       Objective:   Physical Exam  Constitutional: He is oriented to person, place, and time. He appears well-developed and well-nourished. No distress.  HENT:  Head: Normocephalic and atraumatic.  Eyes: Conjunctivae and EOM are normal. Pupils are equal, round, and reactive to light.  Neck: Normal range of motion. Neck supple.  Cardiovascular: Regular rhythm and normal heart sounds.  Exam reveals decreased pulses.   Pulses:      Dorsalis pedis pulses are 1+ on Andrew right side, and 1+ on Andrew left side.       Slightly tachycardic, difficult to obtain bp  Pulmonary/Chest: Effort normal and breath sounds normal.  Abdominal: Soft. Bowel sounds are normal. There is no tenderness.  Musculoskeletal:       Feet:  Neurological: He is alert and oriented to person, place, and time.  Skin: Skin is warm and dry.  Psychiatric: He has a  normal mood and affect.       Assessment:     1. Nausea with vomiting 2. Hepatitis B 3. Hypotension: bp initially 81'V systolic on manual cuff with pulse 94, after emesis and with automatic cuff 84/56 4. Diabetes: CBG 124 5. Bilateral ankle ulcerations 6. Presyncope    Plan:     - phernegan 25  Mg po qd - IV NS Bolus x 1L - STAT CBC, CMET, lipase -STAT 12 lead EKG -admit to SDU given hypotension and further evaluate for cardiac ischemia, GI bleed and/or sepsis -will need Rapid Response to Assess per protocol and transport to floor

## 2011-06-13 NOTE — Progress Notes (Signed)
  IV started in rt hand with #20 angio cath.  Labs drawn at same time. DSD IV started with NS 1,000cc

## 2011-06-13 NOTE — H&P (Signed)
Hospital Admission Note Date: 06/13/2011  Patient name: Andrew Fowler Medical record number: [id number] Date of birth: 1960-06-22 Age: 51 y.o. Gender: male PCP: Larey Dresser, MD, MD  Medical Service:  [facility]  Attending physician:   Dr. Oval Linsey  1st Contact:    Dr. Orvilla Fus Pager: [phone] 2nd Contact:    Dr. Posey Pronto Pager: [phone] After 5 pm or weekends: 1st Contact:      Pager: [phone] 2nd Contact:      Pager: [phone]  Chief Complaint: Hypotension, one episode of vomiting  History of Present Illness: 51 year old man with history of acute hep B, diabetes, hypertension, and anxiety associated with depression who returned to clinic today for follow-up after recent Hep B diagnosis.  During blood pressure measurement in clinic he became lightheaded, nauseated, and vomited yellow emesis once.  He reports decreased hearing at the time that was improved since.  BP was difficult to measure, and initial reading was 22Q systolic.  No blood in the vomit.  No fevers or chills.    Patient reports he felt fine this morning.  He ate a banana and felt good.  CBG on arrival to clinic was 124.  He took two vicodin this morning.  Recently he has been taking one vicodin with each dose several times per day, but before he had taken 8 vicodin per day total and tolerated it fine.  He does report feeling thirsty this morning.      He has taken prednisone 28m PO daily for months now for rheumatoid arthritis.  He did miss his morning dose today because he ran out, but he repeatedly says that he definitely did not miss any doses until today.    He has never fainted before.    No diarrhea, constipation.  No recent nausea or vomiting before this.  No numbness or tingling anywhere new.     Meds: Medication List  As of 06/13/2011  4:54 PM   ASK your doctor about these medications         acetaminophen 325 MG tablet   Commonly known as: TYLENOL   Take 2 tablets (650 mg total) by mouth every  6 (six) hours as needed (or Fever >/= 101).      ALPRAZolam 0.5 MG tablet   Commonly known as: XANAX   Take 1 tablet (0.5 mg total) by mouth 3 (three) times daily. For anxiety      BAYER LOW STRENGTH 81 MG EC tablet   Generic drug: aspirin   Take 81 mg by mouth daily.      FLUoxetine 20 MG capsule   Commonly known as: PROZAC   Take 20 mg by mouth daily.      gabapentin 300 MG capsule   Commonly known as: NEURONTIN   Take 600 mg by mouth 3 (three) times daily.      HYDROcodone-acetaminophen 5-325 MG per tablet   Commonly known as: NORCO   Take 1-2 tablets by mouth every 6 (six) hours as needed.      lisinopril 10 MG tablet   Commonly known as: PRINIVIL,ZESTRIL   Take 10 mg by mouth daily.      metFORMIN 500 MG tablet   Commonly known as: GLUCOPHAGE   Take 500 mg by mouth 2 (two) times daily with a meal.      promethazine 25 MG tablet   Commonly known as: PHENERGAN   Take 1 tablet (25 mg total) by mouth every 6 (six) hours as needed for  nausea.      simvastatin 40 MG tablet   Commonly known as: ZOCOR   Take 1 tablet (40 mg total) by mouth at bedtime.      VENTOLIN HFA 108 (90 BASE) MCG/ACT inhaler   Generic drug: albuterol   Inhale 1-2 puffs into the lungs every 4 (four) hours as needed. shortness of breath          Prednisone 20 mg PO daily  Allergies: Review of patient's allergies indicates no known allergies.  Past Medical History  Diagnosis Date  . Hypertension     ACEI monotherapy  . Hyperlipidemia     At goal of LDL <100 with statin.  . Obesity, Class III, BMI 40-49.9 (morbid obesity)   . Tobacco abuse     Per pt, he has a diagnosis of COPD. Need to locate PFT's.  . Gout     Pt has never had a crystal diagnosis.  . Diabetes mellitus     Non-insulin dependent Type II.  Marland Kitchen Obstructive sleep apnea 08/21/08    Sleep study AHI 16.4 with desat to 66%. CPAP of 17 decreased AHI to 0.9. Non-compliant with CPAP.  Marland Kitchen Anxiety associated with depression 04/19/2011      On chronic benzos and SSRI's. Gets panic attacks. Does not see mental health.  . Chronic pain syndrome     s/p L4-L5 laminectomy. Lumbar MRI 4/11 : L3-L4 mod central canal narrowing.  Cervical MRI 6/11 : multi-level DDD and L foraminal stenosis at C4-5 and C5-6.  Marland Kitchen COPD (chronic obstructive pulmonary disease)     Per pt, he has a diagnosis of COPD. No PFT's. Uses Alb MDI less than once a month.  . RA (rheumatoid arthritis) 2013  . Chronic venous insufficiency 2013    ECHO 2013 was normal. LFT's, creatinine, TSH all normal. Alb a bit low.   Past Surgical History  Procedure Date  . Laminectomy 1995    L4-L5  . Joint replacement 2009    left hip replacement   Family History  Problem Relation Age of Onset  . Diabetes Mother   . Heart attack Mother   . Heart disease Mother   . Diabetes Father   . Kidney disease Father    History   Social History  . Marital Status: Single    Spouse Name: N/A    Number of Children: N/A  . Years of Education: N/A   Occupational History  . Not on file.   Social History Main Topics  . Smoking status: Current Everyday Smoker -- 1.0 packs/day    Types: Cigarettes  . Smokeless tobacco: Not on file  . Alcohol Use: 0.0 oz/week     weekends  . Drug Use: No     Prior THC and crakc cocaine use.   Marland Kitchen Sexually Active: Not on file   Other Topics Concern  . Not on file   Social History Narrative  . No narrative on file    Review of Systems: See HPI  Physical Exam: Filed Vitals:   06/13/11 1656  BP: 79/39  Temp: 97.8 F (36.6 C)  BP 88/48, P 91, T 35.4, Wt 283, SiO2 96%  General: alert, well-developed, and cooperative to examination. Comfortable and in no distress Head: normocephalic and atraumatic.  Eyes: vision grossly intact, pupils equal, pupils round, pupils reactive to light, no injection and anicteric.  Mouth: pharynx pink and moist, no erythema, and no exudates.  Neck: no JVD and no carotid bruits.  Lungs: Diffuse wheezing  present.  No crackles. Heart: normal rate, regular rhythm, no murmur, no gallop, and no rub.  Abdomen: Large abdomen with abdominal striae.  Soft, non-tender, normal bowel sounds, no guarding, no rebound tenderness. Pulses: 2+ DP/PT pulses bilaterally Extremities: No cyanosis, clubbing, edema Neurologic: alert & oriented X3, cranial nerves II-XII intact, strength normal in all extremities Skin: turgor normal and no rashes.    Lab results: Basic Metabolic Panel:  Basename 06/13/11 1351  NA 131*  K 4.6  CL 94*  CO2 24  GLUCOSE 111*  BUN 28*  CREATININE 0.87  CALCIUM 9.8  MG --  PHOS --   Liver Function Tests:  Basename 06/13/11 1351  AST 82*  ALT 357*  ALKPHOS 386*  BILITOT 0.4  PROT 7.0  ALBUMIN 3.2*    Basename 06/13/11 1351  LIPASE 81*  AMYLASE --   CBC:  Basename 06/13/11 1351  WBC 19.3*  NEUTROABS --  HGB 14.5  HCT 43.8  MCV 87.8  PLT 285   CBG:  Basename 06/13/11 1330  GLUCAP 124*    Imaging results:  Dg Chest Port 1 View  06/13/2011  *RADIOLOGY REPORT*  Clinical Data: Wheezing, hypotension  PORTABLE CHEST - 1 VIEW  Comparison: Portable chest x-ray of 05/06/2011  Findings: The lungs are clear.  The heart is mildly enlarged.  No bony abnormality is seen.  IMPRESSION: Stable chest x-ray.  Mild cardiomegaly.  No active lung disease.  Original Report Authenticated By: Joretta Bachelor, M.D.    Other results: EKG: Normal rate.  Sinus rhythm.  1st degree AV block, otherwise normal EKG.  PR was 196 ms on previous, 224 ms on today's EKG.  Assessment & Plan by Problem:  Hypotension: Etiology uncertain.  Per clinic records BPs have always been normal previously.  May be adrenal insufficiency due to his chronic prednisone therapy.  May also represent hypovolemia, but no recent history of poor PO intake.  Leukocytosis makes sepsis a possibility, but he has no fever, CXRay is clear, and no obvious sign of infection.  Alternatively, his hypotension may be related  to his acute Hepatitis B.  Will check PT INR and pTT to make sure this is not severe liver failure.  Leukocytosis may be due to hep B or his steroid use alternatively as well.    -Admit to step down -IVF 2 L bolus given so far in clinic, will order 2L bolus more given echo last month with normal EF.  NS @ 125 per hr continuous after that -Prednisone 82m PO given in clinic, will make this daily for now to act as stress dose steroids -STAT cortisol level -Blood cultures, urinalysis -INR, pTT -nurse to call for HR > 110 or BP < 90/60 constantly -Will check cardiac enzymes given PR interval longer than previous   Vomiting/Nausea: Likely related to his hypotension or etiology of his hypotension.  Currently completely resolved, and the episode was very transient.    Hepatitis B: New diagnosis.  ALT 357 today up from 161 on 05/28/11.  May represent worsening of his acute infection.  Will continue to monitor at this point.  Will need ID clinic referral vs hepatitis clinic referral on discharge.    Rheumatoid Arthritis: Recent diagnosis.  On prednisone for this.  Saw Dr. HTrudie Reed1 1/2 weeks ago, no additional meds.  Has been taking pred[facility] 2-3 months, [facility]  Stress steroids as per above.  Will hold narcotics as they may cause hypotension.  Tylenol for pain.  Diabetes: HbA1c 6.1% 05/06/11.  Recent CBGs on record look good as well.  SSI sensitive, CBG checks.  Will hold metformin in acute setting until we are sure he is stable.    Bilateral Ankle Ulcerations: Small, non-erythematous.  Scabbed over.  Nothing to do acutely at this time.  Will continue to monitor.  Anxiety: Will hold ativan as it may have contributed to this hypotension.  Continue prozac.  DVT Prophylaxis: Lovenox      SignedOrvilla Fus, BRAD 06/13/2011, 4:53 PM

## 2011-06-13 NOTE — Progress Notes (Signed)
Report called to [facility] on 6500.     IV intact with N/S going at 500 cc per hour.  Pt transported via stretcher to [place] cc have been given.  Sander Nephew, RN 3/21,2013 2:25 PM.

## 2011-06-14 LAB — GLUCOSE, CAPILLARY: Glucose-Capillary: 162 mg/dL — ABNORMAL HIGH (ref 70–99)

## 2011-06-14 LAB — BASIC METABOLIC PANEL
BUN: 21 mg/dL (ref 6–23)
Chloride: 101 mEq/L (ref 96–112)
GFR calc Af Amer: >90 mL/min (ref >90)
Glucose, Bld: 220 mg/dL — ABNORMAL HIGH (ref 70–99)
Potassium: 4.7 mEq/L (ref 3.5–5.1)
Sodium: 133 mEq/L — ABNORMAL LOW (ref 135–145)

## 2011-06-14 LAB — IRON AND TIBC
Saturation Ratios: 55 % (ref 20–55)
TIBC: 249 ug/dL (ref 215–435)

## 2011-06-14 MED ORDER — PREDNISONE 10 MG PO TABS: ORAL_TABLET | ORAL | Status: DC

## 2011-06-14 MED ORDER — HYDROCODONE-ACETAMINOPHEN 5-325 MG PO TABS: 1.0000 | ORAL_TABLET | Freq: Four times a day (QID) | ORAL | Status: DC | PRN

## 2011-06-14 NOTE — Progress Notes (Signed)
Subjective: Pt's pressures have normalized since last night and he continues to feel great.   Objective: Vital signs in last 24 hours: Filed Vitals:   06/14/11 0009 06/14/11 0433 06/14/11 0750 06/14/11 1223  BP: 124/74 142/81 153/86 139/71  Pulse: 102 76 98 97  Temp: 98.1 F (36.7 C) 98.4 F (36.9 C) 97.7 F (36.5 C) 97.9 F (36.6 C)  TempSrc:   Oral Oral  Resp: 17 14 22 24   SpO2: 97% 96% 97% 96%   Weight change:   Intake/Output Summary (Last 24 hours) at 06/14/11 1245 Last data filed at 06/14/11 1224  Gross per 24 hour  Intake   4740 ml  Output   4750 ml  Net    -10 ml   Physical Exam:  General: alert, well-developed, and cooperative to examination. Comfortable and in no distress Head: normocephalic and atraumatic.  Neck: no JVD Lungs:Improved wheezing. No crackles. Heart: normal rate, regular rhythm, no murmur, no gallop, and no rub.  Abdomen: Large abdomen with abdominal striae. Soft, non-tender, normal bowel sounds, no guarding, no rebound tenderness. Pulses: 2+ DP/PT pulses bilaterally Extremities: No cyanosis, clubbing, edema Neurologic: alert & oriented X3, cranial nerves II-XII intact, strength normal in all extremities Skin: turgor normal and no rashes.     Lab Results: Basic Metabolic Panel:  Lab 35/45/62 0225 06/13/11 1351  NA 133* 131*  K 4.7 4.6  CL 101 94*  CO2 23 24  GLUCOSE 220* 111*  BUN 21 28*  CREATININE 0.62 0.87  CALCIUM 8.9 9.8  MG -- --  PHOS -- --   Liver Function Tests:  Lab 06/13/11 1351  AST 82*  ALT 357*  ALKPHOS 386*  BILITOT 0.4  PROT 7.0  ALBUMIN 3.2*    Lab 06/13/11 1351  LIPASE 81*  AMYLASE --   CBC:  Lab 06/13/11 1351  WBC 19.3*  NEUTROABS --  HGB 14.5  HCT 43.8  MCV 87.8  PLT 285   Cardiac Enzymes:  Lab 06/14/11 0920 06/14/11 0225 06/13/11 1730  CKTOTAL 19 18 20   CKMB 1.1 1.3 1.4  CKMBINDEX -- -- --  TROPONINI <0.30 <0.30 <0.30   CBG:  Lab 06/14/11 0753 06/13/11 2059 06/13/11 1701 06/13/11  1330  GLUCAP 162* 214* 93 124*   Coagulation:  Lab 06/13/11 1730  LABPROT 13.1  INR 0.97   Urine Drug Screen: Drugs of Abuse     Component Value Date/Time   LABOPIA POS* 04/23/2011 1113   LABOPIA NONE DETECTED 06/25/2010 0022   COCAINSCRNUR NEG 04/23/2011 1113   COCAINSCRNUR NONE DETECTED 06/25/2010 0022   LABBENZ POS* 04/23/2011 1113   LABBENZ NONE DETECTED 06/25/2010 0022   AMPHETMU NONE DETECTED 06/25/2010 0022   THCU NONE DETECTED 06/25/2010 0022   LABBARB NEG 04/23/2011 1113   LABBARB  Value: NONE DETECTED        DRUG SCREEN FOR MEDICAL PURPOSES ONLY.  IF CONFIRMATION IS NEEDED FOR ANY PURPOSE, NOTIFY LAB WITHIN 5 DAYS.        LOWEST DETECTABLE LIMITS FOR URINE DRUG SCREEN Drug Class       Cutoff (ng/mL) Amphetamine      1000 Barbiturate      200 Benzodiazepine   563 Tricyclics       893 Opiates          300 Cocaine          300 THC              50 06/25/2010 0022    Urinalysis:  Lab 06/13/11 1719  COLORURINE YELLOW  LABSPEC 1.010  PHURINE 6.0  GLUCOSEU NEGATIVE  HGBUR NEGATIVE  BILIRUBINUR NEGATIVE  KETONESUR NEGATIVE  PROTEINUR NEGATIVE  UROBILINOGEN 0.2  NITRITE NEGATIVE  LEUKOCYTESUR NEGATIVE    Micro Results: Recent Results (from the past 240 hour(s))  MRSA PCR SCREENING     Status: Normal   Collection Time   06/13/11  5:21 PM      Component Value Range Status Comment   MRSA by PCR NEGATIVE  NEGATIVE  Final    Studies/Results: Dg Chest Port 1 View  06/13/2011  *RADIOLOGY REPORT*  Clinical Data: Wheezing, hypotension  PORTABLE CHEST - 1 VIEW  Comparison: Portable chest x-ray of 05/06/2011  Findings: The lungs are clear.  The heart is mildly enlarged.  No bony abnormality is seen.  IMPRESSION: Stable chest x-ray.  Mild cardiomegaly.  No active lung disease.  Original Report Authenticated By: Joretta Bachelor, M.D.   Medications: I have reviewed the patient's current medications. Scheduled Meds:   . aspirin EC  81 mg Oral Daily  . enoxaparin  40 mg Subcutaneous Q24H  .  FLUoxetine  20 mg Oral Daily  . gabapentin  600 mg Oral TID  . insulin aspart  0-9 Units Subcutaneous TID WC  . simvastatin  40 mg Oral QHS  . sodium chloride  2,000 mL Intravenous Once  . DISCONTD: aspirin  81 mg Oral Daily  . DISCONTD: metFORMIN  500 mg Oral BID WC  . DISCONTD: methylPREDNISolone (SOLU-MEDROL) injection  125 mg Intravenous TID  . DISCONTD: predniSONE  60 mg Oral Q breakfast   Continuous Infusions:   . sodium chloride 125 mL/hr at 06/14/11 0652   PRN Meds:.acetaminophen, albuterol, albuterol   Assessment/Plan: Hypotension: Etiology uncertain. Resolved with fluids and steroids, most likely etiology seems to be acute adrenal insufficiency. Will discharge on prednisone taper back down to his previous 76m daily.    Elevated Liver Enzymes: Serologies done indicate possible past Hep B infection that is most likely now cleared, possibly in the "window period" on 05/28/11 labs.  [facility] IgG [facility] 2[facility]4 [facility] to confirm.  Workup otherwise negative except for borderline anti-smooth muscle ab and no iron studies done to rule out hemochromatosis.   Will order iron studies.  Will also order SPEP, which is possibly more sensitive for autoimmune hepatitis than is anti smooth muscle ab.  [facility]  Fu in clinic next week for results of SPEP and iron studies.  Pending results, patient may need referral to local GI doc for definitive diagnostics of his liver dz.    As of now, etiology of elevated LFTs unclear as if he had Hep B it is likely now cleared.  Elevated LFTs seen in adrenal insufficiency alone.  Alternatively, may be vasculitis related or one of the above Autoimmune Hep or hemochromatosis (DM plus elevated LFTs).   .  Rheumatoid Arthritis: Recent diagnosis. On prednisone for this. Saw Dr. HTrudie Reed1 1/2 weeks ago, no additional meds. Has been taking prednisone 2-3 months, [facility] Dr. HTrudie Reed wants definitive liver diagnosis before starting DMARD meds.  Continue pred and vicodin on discharge.  His vicodin dose should be safe even if he has significant liver disease.    DVT Prophylaxis: Lovenox    LOS: 1 day   WProvidence Lanius3/22/2013, 12:45 PM

## 2011-06-14 NOTE — H&P (Signed)
Internal Medicine Attending Admission Note Date: 06/14/2011  Patient name: Andrew Fowler Medical record number: [id number] Date of birth: 12/21/1960 Age: 51 y.o. Gender: male  I saw and evaluated the patient. I reviewed the resident's note and I agree with the resident's findings and plan as documented in the resident's note.  Chief Complaint(s): nausea, vomiting, and dizziness.  History - key components related to admission:  Andrew Fowler is a 51 year old man with a history of diabetes, hypertension, hyperlipidemia, and rheumatoid arthritis who presents to clinic for a routine follow up. He felt fine on the morning of presentation, but when he stood up to get his blood pressure measured in clinic he vomited times one. At this time he felt dizzy and his measured blood pressures were anywhere between systolics of 60 and 90. He denies any previous nausea or vomiting or diarrhea. His oral intake has been fine. He is taking his medications as prescribed. He denies chest pain, shortness of breath, palpitations, abdominal pain, fevers, shakes, chills, or sick contacts. Although he felt immediately fine after his vomiting episode he was admitted to the hospital because of the blood pressure. He received one dose of prednisone as well as several liters of IV fluid with improvement in his blood pressure. He continues to feel well this morning.  Physical Exam - key components related to admission:  Filed Vitals:   06/14/11 0009 06/14/11 0433 06/14/11 0750 06/14/11 1223  BP: 124/74 142/81 153/86 139/71  Pulse: 102 76 98 97  Temp: 98.1 F (36.7 C) 98.4 F (36.9 C) 97.7 F (36.5 C) 97.9 F (36.6 C)  TempSrc:   Oral Oral  Resp: 17 14 22 24   SpO2: 97% 96% 97% 96%   Gen. Well-developed, well-nourished, obese man lying comfortably in bed in no acute distress Lungs: Clear to auscultation bilaterally without wheezes, rhonchi, or rales. Heart: Regular rate and rhythm without murmurs, rubs, or  gallops. Abdomen: Soft, non tender, active bowel sounds without masses appreciated. Extremities: Without edema.  Lab results:  Basic Metabolic Panel:  Basename 06/14/11 0225 06/13/11 1351  NA 133* 131*  K 4.7 4.6  CL 101 94*  CO2 23 24  GLUCOSE 220* 111*  BUN 21 28*  CREATININE 0.62 0.87  CALCIUM 8.9 9.8  MG -- --  PHOS -- --   Liver Function Tests:  Basename 06/13/11 1351  AST 82*  ALT 357*  ALKPHOS 386*  BILITOT 0.4  PROT 7.0  ALBUMIN 3.2*    Basename 06/13/11 1351  LIPASE 81*  AMYLASE --   CBC:  Basename 06/13/11 1351  WBC 19.3*  NEUTROABS --  HGB 14.5  HCT 43.8  MCV 87.8  PLT 285   Cardiac Enzymes:  Basename 06/14/11 0920 06/14/11 0225 06/13/11 1730  CKTOTAL 19 18 20   CKMB 1.1 1.3 1.4  CKMBINDEX -- -- --  TROPONINI <0.30 <0.30 <0.30   CBG:  Basename 06/14/11 1252 06/14/11 0753 06/13/11 2059 06/13/11 1701 06/13/11 1330  GLUCAP 216* 162* 214* 93 124*   Coagulation:  Basename 06/13/11 1730  INR 0.97   Urinalysis:  Negative  Imaging results:  Dg Chest Port 1 View  06/13/2011  *RADIOLOGY REPORT*  Clinical Data: Wheezing, hypotension  PORTABLE CHEST - 1 VIEW  Comparison: Portable chest x-ray of 05/06/2011  Findings: The lungs are clear.  The heart is mildly enlarged.  No bony abnormality is seen.  IMPRESSION: Stable chest x-ray.  Mild cardiomegaly.  No active lung disease.  Original Report Authenticated By: Andrew Fowler, M.D.  Other results:  EKG: Normal sinus rhythm, first degree AV block, no significant Q waves or LVH, good R wave progression, no changes from the previous EKG in February 2013.  Assessment & Plan by Problem:  Andrew Fowler is a 51 year old man with a history of diabetes, hypertension, hyperlipidemia, and a possible recent hepatitis B infection which seems to have cleared spontaneously who presents with an episode of vomiting and transient hypotension associated with a leukocytosis, a transaminitis and a mild elevation in the  lipase. He responded well to a single dose of prednisone and several liters of normal saline. The cause of his transient hypotension is unclear. Since he has been on chronic prednisone for his rheumatoid arthritis he may have developed a gastroenteritis and associated stress that could not be appropriately responded to because of his chronic prednisone use. He may have responded to the single dose of prednisone in addition to his baseline dose. He also may have been dehydrated although the vomiting was a brief episode and his oral intake and urinary and stool output have been unchanged. He was completely asymptomatic immediately prior to this episode making true dehydration unlikely. He may have had a transient pancreatitis secondary to a gallstone given the elevated transaminases, alkaline phosphatase, and lipase. He may have passed the stone as he has had no symptoms otherwise associated with the event. Despite the above he continues to feel well and his blood pressure is back to baseline.  1) Hypotension: The hypotension has resolved with a single dose of prednisone and several liters of normal saline. We will increase his prednisone to 40 mg a day for one week, decrease it to 30 mg a day for one week, and then down to his baseline 20 mg a day thereafter. We will discharge Andrew Fowler home today with follow up in the clinic late next week.  2) Transaminitis: We will complete his hepatitis workup by getting an SPEP and an iron panel. This will be followed up in clinic late next week. If his transaminitis remains elevated an ultrasound can be obtained once again to assess for evidence of cholelithiasis or any parenchymal disease. He can also be referred to the gastroenterology clinic to assess his liver given his new diagnosis of rheumatoid arthritis and likely need for disease modifying therapy. Some of these therapies can adversely affect the liver, therefore it is important to assure his liver disease has  resolved. The IgM core antibody for hepatitis B suggests that he is in the window of a recent infection that he has spontaneously cleared.  As his serologies are somewhat confusing it may be prudent to repeat an IgG core antibody in about one month to assure that it is increasing and further establishing the diagnosis of a recent hepatitis B infection.  3) Rheumatoid arthritis: Further workup of the liver disease will be done as an outpatient as noted above. He should follow up with rheumatology to be an appropriate therapy for his rheumatoid arthritis as the prednisone is further weaned.

## 2011-06-14 NOTE — Progress Notes (Signed)
Pt education completed, pt states understanding, and that he has no questions. Pt follow-up appointment and medications reviewed and given to patient/family. No distress noted, IV and monitor d/c'd pt discharged via personal wheelchair

## 2011-06-14 NOTE — Progress Notes (Signed)
Inpatient Diabetes Program Recommendations  AACE/ADA: New Consensus Statement on Inpatient Glycemic Control (2009)  Target Ranges:  Prepandial:   less than 140 mg/dL      Peak postprandial:   less than 180 mg/dL (1-2 hours)      Critically ill patients:  140 - 180 mg/dL    Results for Andrew Fowler, Andrew Fowler (MRN [id number]) as of 06/14/2011 13:13  Ref. Range 06/14/2011 07:53 06/14/2011 12:52  Glucose-Capillary Latest Range: 70-99 mg/dL 162 (H) 216 (H)    Inpatient Diabetes Program Recommendations Correction (SSI): Please increase SSI to Moderate scale tid ac + HS while patient on IV steroids.  Note: Will follow. Wyn Quaker RN, MSN, CDE Diabetes Coordinator Inpatient Diabetes Program [phone]

## 2011-06-17 ENCOUNTER — Encounter: Payer: Self-pay | Admitting: Internal Medicine

## 2011-06-18 LAB — PROTEIN ELECTROPHORESIS, SERUM
Beta 2: 4.3 % (ref 3.2–6.5)
Beta Globulin: 6.9 % (ref 4.7–7.2)
Gamma Globulin: 13.8 % (ref 11.1–18.8)
M-Spike, %: NOT DETECTED g/dL

## 2011-06-18 NOTE — Discharge Summary (Signed)
[facility] Discharge Note  Name: Andrew Fowler MRN: [id number] DOB: January 29, 1961 51 y.o.  Date of Admission: 06/13/2011  4:46 PM Date of Discharge: 06/14/2011 Attending Physician: Dr. Oval Linsey  Discharge Diagnosis:  Hypotension Elevated LFTs Rheumatoid Arthritis HTN HLD DM Anxiety/Depression COPD   Discharge Medications: Medication List  As of 06/18/2011 12:28 PM   TAKE these medications         ALPRAZolam 0.5 MG tablet   Commonly known as: XANAX   Take 0.5 mg by mouth 3 (three) times daily as needed. For anxiety      BAYER LOW STRENGTH 81 MG EC tablet   Generic drug: aspirin   Take 81 mg by mouth daily.      FLUoxetine 20 MG capsule   Commonly known as: PROZAC   Take 20 mg by mouth daily.      gabapentin 300 MG capsule   Commonly known as: NEURONTIN   Take 300 mg by mouth 3 (three) times daily.      HYDROcodone-acetaminophen 5-325 MG per tablet   Commonly known as: NORCO   Take 1-2 tablets by mouth every 6 (six) hours as needed. For pain      HYDROcodone-acetaminophen 5-325 MG per tablet   Commonly known as: NORCO   Take 1 tablet by mouth every 6 (six) hours as needed for pain.      lisinopril 10 MG tablet   Commonly known as: PRINIVIL,ZESTRIL   Take 10 mg by mouth daily.      metFORMIN 500 MG tablet   Commonly known as: GLUCOPHAGE   Take 500 mg by mouth 2 (two) times daily with a meal.      predniSONE 10 MG tablet   Commonly known as: DELTASONE   Take 4 tablets by mouth once daily for one week, Then take 3 tablets once daily for one week, Then take 2 tablets once daily after that      simvastatin 40 MG tablet   Commonly known as: ZOCOR   Take 40 mg by mouth at bedtime.      VENTOLIN HFA 108 (90 BASE) MCG/ACT inhaler   Generic drug: albuterol   Inhale 1-2 puffs into the lungs every 4 (four) hours as needed. shortness of breath            Disposition and follow-up:   [name] was discharged from  [facility] in Stable condition.  He is to follow up in the outpatient clinic on 06/19/11 with Dr. Silverio Decamp.  Please make sure his blood pressure is stable at that time.  Please strongly consider GI referral for further workup of his elevated LFTs and possible hepatitis B.    Follow-up Appointments: Follow-up Information    Follow up with HO,MICHELE, MD on 06/19/2011. (8:15am)    Contact information:   [name] [phone]         Discharge Orders    Future Appointments: Provider: Department: Dept Phone: Center:   06/19/2011 8:15 AM Ansel Bong, MD [facility] [phone] [facility]   07/16/2011 1:15 PM Bartholomew Crews, MD [facility] [phone] [facility]     Future Orders Please Complete By Expires   Diet Carb Modified      Activity as tolerated - No restrictions      Call MD for:  persistant dizziness or light-headedness      Call MD for:  difficulty breathing, headache or visual  disturbances      Call MD for:  persistant nausea and vomiting         Consultations:    Procedures Performed:  Dg Chest Port 1 View  06/13/2011  *RADIOLOGY REPORT*  Clinical Data: Wheezing, hypotension  PORTABLE CHEST - 1 VIEW  Comparison: Portable chest x-ray of 05/06/2011  Findings: The lungs are clear.  The heart is mildly enlarged.  No bony abnormality is seen.  IMPRESSION: Stable chest x-ray.  Mild cardiomegaly.  No active lung disease.  Original Report Authenticated By: Joretta Bachelor, M.D.    Admission HPI:  51 year old man with history of acute hep B, diabetes, hypertension, and anxiety associated with depression who returned to clinic today for follow-up after recent Hep B diagnosis. During blood pressure measurement in clinic he became lightheaded, nauseated, and vomited yellow emesis once. He reports decreased hearing at the time that was improved since. BP was difficult to measure, and initial reading was 10C systolic. No blood  in the vomit. No fevers or chills.  Patient reports he felt fine this morning. He ate a banana and felt good. CBG on arrival to clinic was 124. He took two vicodin this morning. Recently he has been taking one vicodin with each dose several times per day, but before he had taken 8 vicodin per day total and tolerated it fine. He does report feeling thirsty this morning.  He has taken prednisone 84m PO daily for months now for rheumatoid arthritis. He did miss his morning dose today because he ran out, but he repeatedly says that he definitely did not miss any doses until today.  He has never fainted before.  No diarrhea, constipation. No recent nausea or vomiting before this. No numbness or tingling anywhere new.    Hospital Course by problem list:  Hypotension: Etiology uncertain.  No history of this in the records that we could find. Patient had an episode of vomiting in clinic but then was completely asymptomatic.  Initially, syncope seemed most likely etiology, but the hypotension persisted for about 5-6 hours which makes syncope a less likely cause.  Resolved with fluids (4-5 liters of NS) and steroids, most likely etiology seems to be acute adrenal insufficiency.  Patient did miss his morning dose of prednisone on day of presentation, but had not missed any other doses.  Will discharge on prednisone taper back down to his previous 223mdaily.     Elevated Liver Enzymes:  ALT 357 on admission, AST 82, Alk Phos 386.  Had previous workups for abnormal LFTs in March 2010 and then February 2013.    In March 2010, LFTs as high as ALT 851, AST 225, Alk Phos 361, T Bili 1.3.  Extensive workup at that time included negative CMV IgM, Alpha 1 antitrypsin, AntiMitochondrial Ab, Antismooth muscle Ab, Ceruloplasmin, Hep B surf Ag, HCV Ab, HepA IgM, Hep B Core IgM all negative or normal.    In February 2013, Alk Phos 632, ALT 39, AST 31.  GGT 407, CCP 7.5 (high), ANA negative, Hiv Ab Neg, ESR 115,  CRP  22.7, RF > 600.  Hepatitis Panel showed Positive Hep B Core IgM but negative Hep B surface Ag.  HCV Ab and Hep A IgM both negative. Diagnosed with rheumatoid arthritis and sent to Dr. HaTrudie Reedn Rheum.  [facility].  Abdominal US[place] not show any gallstones, just contracted gallbladder.  Dr. HaTrudie Reedrdered more labs  done 05/27/11 and 05/28/11.  These included Hep B surface Ab negative, Hep B Core Total antibody negative. Anti-smooth muscle antibody borderline, CRP 44.1, AST 52, ALT 161, Alk Phos 460.  ESR 54, RF 94, Mitochondrial Ab negative.    Serologies done indicate possible past Hep B infection that is most likely now cleared, possibly in the "window period" on 05/28/11 labs.  We are uncertain if this could account for his current elevated LFTs.  Alternatively, the positive Hep B Core IgM in February could have been false positive?    Current elevated ALT could be entirely due to adrenal crisis?  Alternative explanations would be hep B, vasculitis associated with his RA.  ALT > AST pattern not typical of alcoholic liver dz, and his last drip was about 1 month before this admission despite heavy alcohol use history.  Only holes left in his workup were conclusively excluding autoimmune hepatitis and hemochromatosis.  We ordered iron studies and SPEP on discharge.  Ferritin 1167 very high but may be elevated as acute phase reactant.  Iron 138, Saturation Ratio 55%, while not grossly abnormal, could be consistent with hemochromatosis.  These results came back after discharge.      SPEP result unfortunately could not rule out possibility of faint restricted band in the gamma region.  Per UpToDate, autoimmune hepatitis is commonly associated with a polyclonal gammopathy with a broad based peak in the gamma region, which is why we ordered this test after inconclusive antismooth muscle antibody result.  However, a narrow band would not be consistent with this  condition.  We spoke with Dr. Thayer Headings Hessling at [facility] labs who wrote the SPEP report for this patient.  She felt that the elevated Alpha-1-globulin and Alpha-2 globulin in the report were just relative to his low albumin and total protein, likely nutrition related.  However, she suggested IFE to rule out a monoclonal gammopathy.  This would be something different, such as multiple myeloma, than what autoimmune hepatitis commonly presents as.  IFE was ordered, result should be followed up for completeness, likely a second [facility] clinic appt as it will not be resulted at time of his follow-up.   At [facility] follow[facility]up, please make GI referral for patient now that these discharge labs came back, as, despite extensive outpatient workup of his liver disease, differential still includes hepatitis B, autoimmune hepatitis, vasculitis, and hemochromatosis.   Making a conclusive diagnosis is needed as Dr. Trudie Reed does not want to start DMARD for RA until there is a diagnosis for his liver disease.    In 2-4 weeks, once he would be out of the window period, he will need repeat CMET and Hepatitis B Core IgG to see if he indeed had hepatitis B infection. At next blood draw as outpatient, strongly consider testing for C282Y mutation for hemochromatosis in this diabetic with liver disease of uncertain etiology and iron labs that could be consistent with hemochromatosis.    Rheumatoid Arthritis: Recent diagnosis. On prednisone for this. Saw Dr. Trudie Reed 1 1/2 weeks ago, no additional meds. Has been taking prednisone 2-3 months, prescribed by Dr. Lynnae January in [facility]. Dr. Trudie Reed wants definitive liver diagnosis before starting DMARD meds. Continue pred (taper down to 18m PO daily) and vicodin on discharge. His vicodin dose should be safe even if he has significant liver disease.    Discharge Vitals:  BP 139/71  Pulse 97  Temp(Src) 97.9 F (36.6 C) (Oral)  Resp 24  SpO2 96%  Discharge Labs:  Admission on 06/13/2011,  Discharged on  06/14/2011  Component Date Value Range Status  . aPTT (seconds) 06/13/2011 26  24-37 Final  . Prothrombin Time (seconds) 06/13/2011 13.1  11.6-15.2 Final  . INR  06/13/2011 0.97  0.00-1.49 Final  . Color, Urine  06/13/2011 YELLOW  YELLOW Final  . APPearance  06/13/2011 CLEAR  CLEAR Final  . Specific Gravity, Urine  06/13/2011 1.010  1.005-1.030 Final  . pH  06/13/2011 6.0  5.0-8.0 Final  . Glucose, UA (mg/dL) 06/13/2011 NEGATIVE  NEGATIVE Final  . Hgb urine dipstick  06/13/2011 NEGATIVE  NEGATIVE Final  . Bilirubin Urine  06/13/2011 NEGATIVE  NEGATIVE Final  . Ketones, ur (mg/dL) 06/13/2011 NEGATIVE  NEGATIVE Final  . Protein, ur (mg/dL) 06/13/2011 NEGATIVE  NEGATIVE Final  . Urobilinogen, UA (mg/dL) 06/13/2011 0.2  0.0-1.0 Final  . Nitrite  06/13/2011 NEGATIVE  NEGATIVE Final  . Leukocytes, UA  06/13/2011 NEGATIVE  NEGATIVE Final   MICROSCOPIC NOT DONE ON URINES WITH NEGATIVE PROTEIN, BLOOD, LEUKOCYTES, NITRITE, OR GLUCOSE <1000 mg/dL.  Marland Kitchen Total CK (U/L) 06/13/2011 20  7-232 Final  . CK, MB (ng/mL) 06/13/2011 1.4  0.3-4.0 Final  . Troponin I (ng/mL) 06/13/2011 <0.30  <0.30 Final   Comment:                                 Due to the release kinetics of cTnI,                          a negative result within the first hours                          of the onset of symptoms does not rule out                          myocardial infarction with certainty.                          If myocardial infarction is still suspected,                          repeat the test at appropriate intervals.  . Relative Index  06/13/2011 RELATIVE INDEX IS INVALID  0.0-2.5 Final   Comment: WHEN CK < 100 U/L                                  . Total CK (U/L) 06/14/2011 18  7-232 Final  . CK, MB (ng/mL) 06/14/2011 1.3  0.3-4.0 Final  . Troponin I (ng/mL) 06/14/2011 <0.30  <0.30 Final   Comment:                                 Due to the release kinetics of cTnI,                          a negative result  within the first hours                          of the onset of symptoms does not rule out  myocardial infarction with certainty.                          If myocardial infarction is still suspected,                          repeat the test at appropriate intervals.  . Relative Index  06/14/2011 RELATIVE INDEX IS INVALID  0.0-2.5 Final   Comment: WHEN CK < 100 U/L                                  . Cortisol, Plasma (ug/dL) 06/13/2011 59.4   Final   Comment: (NOTE)                          AM:  4.3 - 22.4 ug/dL                          PM:  3.1 - 16.7 ug/dL  . Sodium (mEq/L) 06/14/2011 133* 135-145 Final  . Potassium (mEq/L) 06/14/2011 4.7  3.5-5.1 Final  . Chloride (mEq/L) 06/14/2011 101  96-112 Final  . CO2 (mEq/L) 06/14/2011 23  19-32 Final  . Glucose, Bld (mg/dL) 06/14/2011 220* 70-99 Final  . BUN (mg/dL) 06/14/2011 21  6-23 Final  . Creatinine, Ser (mg/dL) 06/14/2011 0.62  0.50-1.35 Final  . Calcium (mg/dL) 06/14/2011 8.9  8.4-10.5 Final  . GFR calc non Af Amer (mL/min) 06/14/2011 >90  >90 Final  . GFR calc Af Amer (mL/min) 06/14/2011 >90  >90 Final   Comment:                                 The eGFR has been calculated                          using the CKD EPI equation.                          This calculation has not been                          validated in all clinical                          situations.                          eGFR's persistently                          <90 mL/min signify                          possible Chronic Kidney Disease.  Marland Kitchen Specimen Description  06/13/2011 BLOOD LEFT ARM   Final  . Special Requests  06/13/2011 BOTTLES DRAWN AEROBIC AND ANAEROBIC 10CC   Final  . Culture  Setup Time  06/13/2011 [id number]   Final  . Culture  06/13/2011        BLOOD CULTURE RECEIVED NO GROWTH TO DATE CULTURE WILL BE HELD FOR 5 DAYS BEFORE ISSUING A FINAL  NEGATIVE REPORT   Final  . Report Status  06/13/2011 PENDING   Incomplete  .  Specimen Description  06/13/2011 BLOOD LEFT ARM   Final  . Special Requests  06/13/2011 BOTTLES DRAWN AEROBIC AND ANAEROBIC 10CC   Final  . Culture  Setup Time  06/13/2011 [id number]   Final  . Culture  06/13/2011        BLOOD CULTURE RECEIVED NO GROWTH TO DATE CULTURE WILL BE HELD FOR 5 DAYS BEFORE ISSUING A FINAL NEGATIVE REPORT   Final  . Report Status  06/13/2011 PENDING   Incomplete  . Glucose-Capillary (mg/dL) 06/13/2011 93  70-99 Final  . MRSA by PCR  06/13/2011 NEGATIVE  NEGATIVE Final   Comment:                                 The GeneXpert MRSA Assay (FDA                          approved for NASAL specimens                          only), is one component of a                          comprehensive MRSA colonization                          surveillance program. It is not                          intended to diagnose MRSA                          infection nor to guide or                          monitor treatment for                          MRSA infections.  . Total CK (U/L) 06/14/2011 19  7-232 Final  . CK, MB (ng/mL) 06/14/2011 1.1  0.3-4.0 Final  . Troponin I (ng/mL) 06/14/2011 <0.30  <0.30 Final   Comment:                                 Due to the release kinetics of cTnI,                          a negative result within the first hours                          of the onset of symptoms does not rule out                          myocardial infarction with certainty.                          If myocardial infarction is still suspected,  repeat the test at appropriate intervals.  . Relative Index  06/14/2011 RELATIVE INDEX IS INVALID  0.0-2.5 Final   Comment: WHEN CK < 100 U/L                                  . Glucose-Capillary (mg/dL) 06/13/2011 214* 70-99 Final  . Glucose-Capillary (mg/dL) 06/14/2011 162* 70-99 Final  . Iron (ug/dL) 06/14/2011 138* 42-135 Final  . TIBC (ug/dL) 06/14/2011 249  215-435 Final  . Saturation Ratios (%)  06/14/2011 55  20-55 Final  . UIBC (ug/dL) 06/14/2011 111* 125-400 Final  . Ferritin (ng/mL) 06/14/2011 1167* 22-322 Final  . Total Protein (g/dL) 06/14/2011 5.8* 6.0-8.3 Final  . Albumin (%) 06/14/2011 49.7* 55.8-66.1 Final  . Alpha-1-Globulin (%) 06/14/2011 6.6* 2.9-4.9 Final  . Alpha-2-Globulin (%) 06/14/2011 18.7* 7.1-11.8 Final  . Beta Globulin (%) 06/14/2011 6.9  4.7-7.2 Final  . Beta 2 (%) 06/14/2011 4.3  3.2-6.5 Final  . Gamma Globulin (%) 06/14/2011 13.8  11.1-18.8 Final  . M-Spike, % (g/dL) 06/14/2011 NOT DETECTED   Final  . SPE Interp.  06/14/2011 (NOTE)   Final   Comment: The possibility of a faint restricted band(s) cannot be completely                          excluded in the gamma region.  Suggest serum IFE to evaluate                          possibility, if clinically indicated. (Lab will hold sample one week.                          Reviewed by Odis Hollingshead, MD, PhD, FCAP (Electronic Signature on                          File)  . Comment  06/14/2011 (NOTE)   Final   Comment: ---------------                          Serum protein electrophoresis is a useful screening procedure in the                          detection of various pathophysiologic states such as inflammation,                          gammopathies, protein loss and other dysproteinemias.  Immunofixation                          electrophoresis (IFE) is a more sensitive technique for the                          identification of M-proteins found in patients with monoclonal                          gammopathy of unknown significance (MGUS), amyloidosis, early or                          treated myeloma or macroglobulinemia, solitary plasmacytoma or  extramedullary plasmacytoma.  . Glucose-Capillary (mg/dL) 06/14/2011 216* 70-99 Final  Office Visit on 06/13/2011  Component Date Value Range Status  . Glucose-Capillary (mg/dL) 06/13/2011 124* 70-99 Final  . Sodium (mEq/L)  06/13/2011 131* 135-145 Final  . Potassium (mEq/L) 06/13/2011 4.6  3.5-5.3 Final  . Chloride (mEq/L) 06/13/2011 94* 96-112 Final  . CO2 (mEq/L) 06/13/2011 24  19-32 Final  . Glucose, Bld (mg/dL) 06/13/2011 111* 70-99 Final  . BUN (mg/dL) 06/13/2011 28* 6-23 Final  . Creat (mg/dL) 06/13/2011 0.87  0.50-1.35 Final  . Total Bilirubin (mg/dL) 06/13/2011 0.4  0.3-1.2 Final  . Alkaline Phosphatase (U/L) 06/13/2011 386* 39-117 Final  . AST (U/L) 06/13/2011 82* 0-37 Final  . ALT (U/L) 06/13/2011 357* 0-53 Final  . Total Protein (g/dL) 06/13/2011 7.0  6.0-8.3 Final  . Albumin (g/dL) 06/13/2011 3.2* 3.5-5.2 Final  . Calcium (mg/dL) 06/13/2011 9.8  8.4-10.5 Final  . WBC (K/uL) 06/13/2011 19.3* 4.0-10.5 Final  . RBC (MIL/uL) 06/13/2011 4.99  4.22-5.81 Final  . Hemoglobin (g/dL) 06/13/2011 14.5  13.0-17.0 Final  . HCT (%) 06/13/2011 43.8  39.0-52.0 Final  . MCV (fL) 06/13/2011 87.8  78.0-100.0 Final  . MCH (pg) 06/13/2011 29.1  26.0-34.0 Final  . MCHC (g/dL) 06/13/2011 33.1  30.0-36.0 Final  . RDW (%) 06/13/2011 15.3  11.5-15.5 Final  . Platelets (K/uL) 06/13/2011 285  150-400 Final  . Lipase (U/L) 06/13/2011 81* 11-59 Final  ] Signed: Ted Leonhart, BRAD 06/18/2011, 12:28 PM

## 2011-06-19 ENCOUNTER — Encounter: Payer: Medicaid Other | Admitting: Internal Medicine

## 2011-06-20 ENCOUNTER — Other Ambulatory Visit: Payer: Self-pay | Admitting: Internal Medicine

## 2011-06-20 DIAGNOSIS — B169 Acute hepatitis B without delta-agent and without hepatic coma: Secondary | ICD-10-CM

## 2011-06-20 DIAGNOSIS — E119 Type 2 diabetes mellitus without complications: Secondary | ICD-10-CM

## 2011-06-20 LAB — CULTURE, BLOOD (ROUTINE X 2)
Culture  Setup Time: 201303220135
Culture: NO GROWTH

## 2011-06-21 LAB — IMMUNOFIXATION ELECTROPHORESIS

## 2011-06-24 ENCOUNTER — Telehealth: Payer: Self-pay | Admitting: *Deleted

## 2011-06-24 ENCOUNTER — Other Ambulatory Visit: Payer: Medicaid Other

## 2011-06-24 NOTE — Telephone Encounter (Signed)
Rx for walker sent to [facility] Pt will reschedule appointment tomorrow

## 2011-06-24 NOTE — Telephone Encounter (Signed)
Codes are 278.01, 338.4, and 714.0  He promised me he would resch the appt that he no showed last week. He also needs to come in for labs. Please ask him to do so. Thanks!!

## 2011-06-24 NOTE — Telephone Encounter (Signed)
Call from [place], Patient care Cor. With [facility]  [phone] Ex 352 He states pt received a hospital bed at d/c from Hospital and pt would like a walker. He states this was ordered at d/c.   Pt is asking for a walker for stability. I called [facility] and they sent out bed, tub seat, wheelchair. Will need a Rx with diagnosis code for walker and I will  Fax to [phone]

## 2011-06-27 ENCOUNTER — Other Ambulatory Visit: Payer: Self-pay | Admitting: *Deleted

## 2011-06-27 MED ORDER — HYDROCODONE-ACETAMINOPHEN 5-500 MG PO TABS: 2.0000 | ORAL_TABLET | Freq: Four times a day (QID) | ORAL | Status: DC | PRN

## 2011-06-27 MED ORDER — SIMVASTATIN 40 MG PO TABS: 40.0000 mg | ORAL_TABLET | Freq: Every day | ORAL | Status: DC

## 2011-06-27 MED ORDER — GABAPENTIN 300 MG PO CAPS: 300.0000 mg | ORAL_CAPSULE | Freq: Three times a day (TID) | ORAL | Status: DC

## 2011-06-27 NOTE — Telephone Encounter (Signed)
Called to pharmacy 

## 2011-06-27 NOTE — Telephone Encounter (Signed)
Reviewed [place] database. Has gotten 292 since 1/29 (about 9 weeks). That averages about 37 per week or about 5 per day. That is higher than the prescribed dose. APAP dose per day OK. Pt has reason for pain and has been going to appts (except this most recent lab visit appt). WIll refill one month. Has appt with me later month.

## 2011-06-28 DIAGNOSIS — E119 Type 2 diabetes mellitus without complications: Secondary | ICD-10-CM

## 2011-06-28 DIAGNOSIS — B1910 Unspecified viral hepatitis B without hepatic coma: Secondary | ICD-10-CM

## 2011-07-01 ENCOUNTER — Other Ambulatory Visit (INDEPENDENT_AMBULATORY_CARE_PROVIDER_SITE_OTHER): Payer: Medicaid Other

## 2011-07-01 DIAGNOSIS — E119 Type 2 diabetes mellitus without complications: Secondary | ICD-10-CM

## 2011-07-01 DIAGNOSIS — B169 Acute hepatitis B without delta-agent and without hepatic coma: Secondary | ICD-10-CM

## 2011-07-01 LAB — COMPLETE METABOLIC PANEL WITH GFR
ALT: 73 U/L — ABNORMAL HIGH (ref 0–53)
CO2: 24 mEq/L (ref 19–32)
Calcium: 9.5 mg/dL (ref 8.4–10.5)
Chloride: 97 mEq/L (ref 96–112)
GFR, Est African American: >89 mL/min
Potassium: 4.4 mEq/L (ref 3.5–5.3)
Sodium: 134 mEq/L — ABNORMAL LOW (ref 135–145)
Total Bilirubin: 0.6 mg/dL (ref 0.3–1.2)
Total Protein: 6.2 g/dL (ref 6.0–8.3)

## 2011-07-01 LAB — LIPID PANEL
Cholesterol: 172 mg/dL (ref 0–200)
HDL: 88 mg/dL (ref >39)
Total CHOL/HDL Ratio: 2 Ratio

## 2011-07-04 ENCOUNTER — Encounter: Payer: Self-pay | Admitting: Internal Medicine

## 2011-07-04 DIAGNOSIS — R899 Unspecified abnormal finding in specimens from other organs, systems and tissues: Secondary | ICD-10-CM | POA: Insufficient documentation

## 2011-07-04 HISTORY — DX: Unspecified abnormal finding in specimens from other organs, systems and tissues: R89.9

## 2011-07-08 ENCOUNTER — Telehealth: Payer: Self-pay | Admitting: Internal Medicine

## 2011-07-08 DIAGNOSIS — R748 Abnormal levels of other serum enzymes: Secondary | ICD-10-CM

## 2011-07-08 NOTE — Telephone Encounter (Signed)
Wanted to let him know about blood results. Will try back.

## 2011-07-09 ENCOUNTER — Encounter: Payer: Self-pay | Admitting: Internal Medicine

## 2011-07-09 NOTE — Telephone Encounter (Signed)
Reached pt nad informed - no circulating hep B in blood. Chol great. LFT's trending down.

## 2011-07-15 ENCOUNTER — Encounter: Payer: Self-pay | Admitting: Internal Medicine

## 2011-07-16 ENCOUNTER — Ambulatory Visit (INDEPENDENT_AMBULATORY_CARE_PROVIDER_SITE_OTHER): Payer: Medicaid Other | Admitting: Internal Medicine

## 2011-07-16 ENCOUNTER — Encounter: Payer: Self-pay | Admitting: Internal Medicine

## 2011-07-16 VITALS — BP 95/56 | HR 103 | Temp 96.0°F | Ht 70.0 in | Wt 297.9 lb

## 2011-07-16 DIAGNOSIS — G894 Chronic pain syndrome: Secondary | ICD-10-CM

## 2011-07-16 DIAGNOSIS — R7989 Other specified abnormal findings of blood chemistry: Secondary | ICD-10-CM

## 2011-07-16 DIAGNOSIS — F418 Other specified anxiety disorders: Secondary | ICD-10-CM

## 2011-07-16 DIAGNOSIS — M109 Gout, unspecified: Secondary | ICD-10-CM

## 2011-07-16 DIAGNOSIS — E119 Type 2 diabetes mellitus without complications: Secondary | ICD-10-CM

## 2011-07-16 DIAGNOSIS — I10 Essential (primary) hypertension: Secondary | ICD-10-CM

## 2011-07-16 DIAGNOSIS — M069 Rheumatoid arthritis, unspecified: Secondary | ICD-10-CM

## 2011-07-16 DIAGNOSIS — F341 Dysthymic disorder: Secondary | ICD-10-CM

## 2011-07-16 DIAGNOSIS — E785 Hyperlipidemia, unspecified: Secondary | ICD-10-CM

## 2011-07-16 LAB — COMPLETE METABOLIC PANEL WITH GFR
Alkaline Phosphatase: 220 U/L — ABNORMAL HIGH (ref 39–117)
BUN: 13 mg/dL (ref 6–23)
Chloride: 97 mEq/L (ref 96–112)
Creat: 0.72 mg/dL (ref 0.50–1.35)
GFR, Est Non African American: >89 mL/min
Glucose, Bld: 93 mg/dL (ref 70–99)
Sodium: 135 mEq/L (ref 135–145)
Total Bilirubin: 0.5 mg/dL (ref 0.3–1.2)

## 2011-07-16 LAB — CBC
HCT: 45 % (ref 39.0–52.0)
Hemoglobin: 14.4 g/dL (ref 13.0–17.0)
Platelets: 244 10*3/uL (ref 150–400)
RBC: 4.92 MIL/uL (ref 4.22–5.81)
WBC: 16.7 10*3/uL — ABNORMAL HIGH (ref 4.0–10.5)

## 2011-07-16 MED ORDER — ALPRAZOLAM 0.5 MG PO TABS: 0.5000 mg | ORAL_TABLET | Freq: Three times a day (TID) | ORAL | Status: DC | PRN

## 2011-07-16 MED ORDER — LISINOPRIL 5 MG PO TABS: 10.0000 mg | ORAL_TABLET | Freq: Every day | ORAL | Status: DC

## 2011-07-16 MED ORDER — PREDNISONE 10 MG PO TABS: ORAL_TABLET | ORAL | Status: DC

## 2011-07-16 MED ORDER — GLUCOSE BLOOD VI STRP: ORAL_STRIP | Status: DC

## 2011-07-16 MED ORDER — HYDROCODONE-ACETAMINOPHEN 5-325 MG PO TABS: 1.0000 | ORAL_TABLET | Freq: Four times a day (QID) | ORAL | Status: DC | PRN

## 2011-07-16 NOTE — Patient Instructions (Signed)
Cont the prednisone until you see Dr Trudie Reed.  See me in 1-2 months

## 2011-07-17 ENCOUNTER — Telehealth: Payer: Self-pay | Admitting: Dietician

## 2011-07-17 ENCOUNTER — Encounter: Payer: Self-pay | Admitting: Internal Medicine

## 2011-07-17 NOTE — Assessment & Plan Note (Signed)
His weight has decreased from 350 to 300 over the past three years. Slight increase recently - prednisone?

## 2011-07-17 NOTE — Assessment & Plan Note (Signed)
I reviewed his recent lipid panel results with him and congratulated him on his success. Cont statin.

## 2011-07-17 NOTE — Assessment & Plan Note (Signed)
He had his last dose of prednisone yesterday. He did not refill it bc didn't want to spend the money if we were going to stop it. I have encouraged him to fill it and resume it at the current dose until he sees Dr Trudie Reed.  He is still on his hydrocodone. On a typical day he takes 4-5. On a bad day 5-6. It does ease the pain off. I think he is motivated to improve his health overall - not just the pain. He wants to get the RA treated so he can come off the prednisone bc it increases his appetite. He wants less pain so that he can ambulate and move around better. He has reasonable goals - he is tackling only one or two things at a time. Currently pain / RA and liver.   I refilled his hydrocodone with RF to get him by until next appt.

## 2011-07-17 NOTE — Progress Notes (Signed)
  Subjective:    Patient ID: Andrew Fowler, male    DOB: 03/23/1961, 50 y.o.   MRN: [id number]  HPI  Please see the A&P for the status of the pt's chronic medical problems.  Review of Systems  Constitutional: Negative for activity change, appetite change and unexpected weight change.  HENT: Negative for congestion, rhinorrhea and sneezing.   Eyes: Negative for itching.  Respiratory: Negative for cough, chest tightness and shortness of breath.   Cardiovascular: Positive for leg swelling. Negative for chest pain.  Gastrointestinal: Negative for nausea, vomiting and diarrhea.  Musculoskeletal: Positive for back pain and arthralgias.  Skin: Negative for rash and wound.  Neurological: Negative for dizziness and light-headedness.  Psychiatric/Behavioral: Positive for behavioral problems and agitation. The patient is nervous/anxious.        Objective:   Physical Exam  Constitutional: He is oriented to person, place, and time. He appears well-developed and well-nourished. No distress.  HENT:  Head: Normocephalic and atraumatic.  Right Ear: External ear normal.  Left Ear: External ear normal.  Nose: Nose normal.  Eyes: Conjunctivae are normal.  Cardiovascular: Normal rate, regular rhythm and normal heart sounds.   Musculoskeletal:       R knee slight outer / inferior swelling. Non tender. No warmth / erythema  Neurological: He is alert and oriented to person, place, and time.       In [facility]  Skin: Skin is warm and dry. He is not diaphoretic.  Psychiatric: He has a normal mood and affect. His behavior is normal. Judgment and thought content normal.          Assessment & Plan:

## 2011-07-17 NOTE — Assessment & Plan Note (Signed)
I reviewed his past LFT's and discovered that they were elevated in 2010 during a hospitalization. No cause was found and they trended back to nl. Hep B viral load was negative so the Hep B core Ab was a false +. ALT and alk phos remain slightly high. He is asymptomatic and the only cause I can find is ETOH and have encouraged him to stop ETOH intake except on birthday and July 4th (as example of how infreq he should be drinking).

## 2011-07-17 NOTE — Assessment & Plan Note (Signed)
His R knee pain does not represent gout as it is non tender and I can press and mash on knee without pain.

## 2011-07-17 NOTE — Assessment & Plan Note (Signed)
BP Readings from Last 3 Encounters:  07/16/11 95/56  06/14/11 139/71  06/13/11 88/48   His BP is low. Could be from abrupt D/C of prednisone but that occurred less than 24 hours ago. Will decrease lisinopril to 5 mg QD. Microalb 1 year ago OK.

## 2011-07-17 NOTE — Assessment & Plan Note (Signed)
For now I am continuing the Xanax. I reinforced that this will not be a lifelong med. He feels that the SSRI helps his depression and the Xanax helps with his ability to cope with stressors. It allows him to take the bus. I refilled the Xanax to last until next appt.

## 2011-07-17 NOTE — Telephone Encounter (Signed)
CDE called patient  about prize for getting all his diabetes care item done in past year. Will mail to him along with low sodium and general diabetes informaton per his request.

## 2011-07-17 NOTE — Assessment & Plan Note (Signed)
A1C is great 6.1 2 months ago. We reviewed his log - checks it about QOD. Most are 107-114. 87% are within goal. 13% above goal. No lows. He got his optho exam and no DM retinopathy. UTD on all other screening tests. He is trying to eat healthier - more veggies, less red meat. He inquired about a low sodium diet and I sent flag to Butch Penny to see if she had info to hand him.

## 2011-07-17 NOTE — Assessment & Plan Note (Signed)
He is to see Dr Trudie Reed on the 25th. Cont prednisone and steroid for now. He has no Hep C virus so maybe DMARDs can be started although LFT's and ETOH may preclude their use.

## 2011-07-25 ENCOUNTER — Telehealth: Payer: Self-pay | Admitting: *Deleted

## 2011-07-25 NOTE — Telephone Encounter (Signed)
Pt called asking for a referral for a GI/Liver doctor.  Pt states he saw Dr Jerrye Bushy, Rheumatologist at [facility] and she wants him to have this referral. He states his liver enzymes are elevated and they were to fax labs to our clinic. Pt was seen by Dr Lenna Gilford yesterday. Pt # [id number]

## 2011-07-25 NOTE — Telephone Encounter (Signed)
I am looking further into this as I disagree that he needs a GI referral. Will make final decision before Friday clinic close. Thanks

## 2011-07-26 ENCOUNTER — Other Ambulatory Visit: Payer: Self-pay | Admitting: Internal Medicine

## 2011-07-26 DIAGNOSIS — M069 Rheumatoid arthritis, unspecified: Secondary | ICD-10-CM

## 2011-08-20 ENCOUNTER — Other Ambulatory Visit: Payer: Self-pay | Admitting: Internal Medicine

## 2011-09-10 ENCOUNTER — Ambulatory Visit: Payer: Medicaid Other | Admitting: Internal Medicine

## 2011-09-24 ENCOUNTER — Other Ambulatory Visit: Payer: Self-pay | Admitting: Gastroenterology

## 2011-10-03 ENCOUNTER — Other Ambulatory Visit: Payer: Medicaid Other

## 2011-10-10 ENCOUNTER — Other Ambulatory Visit: Payer: Self-pay | Admitting: *Deleted

## 2011-10-10 DIAGNOSIS — M069 Rheumatoid arthritis, unspecified: Secondary | ICD-10-CM

## 2011-10-11 NOTE — Telephone Encounter (Signed)
Dr Trudie Reed manages his steroids / RA. He only should get prednisone from her.

## 2011-10-13 ENCOUNTER — Other Ambulatory Visit: Payer: Self-pay | Admitting: Internal Medicine

## 2011-10-14 ENCOUNTER — Other Ambulatory Visit: Payer: Medicaid Other

## 2011-10-14 ENCOUNTER — Telehealth: Payer: Self-pay | Admitting: *Deleted

## 2011-10-14 NOTE — Telephone Encounter (Signed)
Pt calls and states he has not gotten his refill, i explained dr butcher's answer for the prednisone refill request, he became very angry stating dr Software engineer was the only one that can order pain medicine for him, i explained even though the prednisone may cause his pain to be less that it is not classified as a pain med and dr Trudie Reed would be the one that needed to fill it, he stated he would be sick and in the hospital all because we didn't do our job and hung up on me. i am calling dr Trudie Reed office to request the refill for him.

## 2011-10-16 ENCOUNTER — Ambulatory Visit
Admission: RE | Admit: 2011-10-16 | Discharge: 2011-10-16 | Disposition: A | Discharge status: Home / Self Care | Payer: Medicaid Other | Source: Ambulatory Visit | Attending: Gastroenterology | Admitting: Gastroenterology

## 2011-10-21 ENCOUNTER — Other Ambulatory Visit: Payer: Self-pay | Admitting: *Deleted

## 2011-10-21 DIAGNOSIS — F418 Other specified anxiety disorders: Secondary | ICD-10-CM

## 2011-10-21 DIAGNOSIS — M069 Rheumatoid arthritis, unspecified: Secondary | ICD-10-CM

## 2011-10-21 MED ORDER — HYDROCODONE-ACETAMINOPHEN 5-325 MG PO TABS: 1.0000 | ORAL_TABLET | Freq: Four times a day (QID) | ORAL | Status: DC | PRN

## 2011-10-21 MED ORDER — ALPRAZOLAM 0.5 MG PO TABS: 0.5000 mg | ORAL_TABLET | Freq: Three times a day (TID) | ORAL | Status: DC | PRN

## 2011-10-21 NOTE — Telephone Encounter (Signed)
Rx called in to pharmacy. 

## 2011-10-21 NOTE — Telephone Encounter (Signed)
Triage - would you pls call in refills? Front desk - would you pls sch routine appt in next couple of months?  Thanks

## 2011-10-21 NOTE — Telephone Encounter (Signed)
Pt wanted you to know results of MRI are back and negative.

## 2011-10-29 ENCOUNTER — Encounter: Payer: Self-pay | Admitting: Internal Medicine

## 2011-11-06 ENCOUNTER — Other Ambulatory Visit: Payer: Self-pay | Admitting: Orthopedic Surgery

## 2011-11-06 DIAGNOSIS — M545 Low back pain: Secondary | ICD-10-CM

## 2011-11-14 ENCOUNTER — Inpatient Hospital Stay: Admission: RE | Admit: 2011-11-14 | Payer: Medicaid Other | Source: Ambulatory Visit

## 2011-11-24 ENCOUNTER — Ambulatory Visit
Admission: RE | Admit: 2011-11-24 | Discharge: 2011-11-24 | Disposition: A | Discharge status: Home / Self Care | Payer: Medicaid Other | Source: Ambulatory Visit | Attending: Orthopedic Surgery | Admitting: Orthopedic Surgery

## 2011-11-24 DIAGNOSIS — M545 Low back pain: Secondary | ICD-10-CM

## 2011-11-25 ENCOUNTER — Encounter: Payer: Self-pay | Admitting: Internal Medicine

## 2011-11-26 ENCOUNTER — Encounter: Payer: Medicaid Other | Admitting: Internal Medicine

## 2011-12-06 ENCOUNTER — Inpatient Hospital Stay: Admission: RE | Admit: 2011-12-06 | Payer: Medicaid Other | Source: Ambulatory Visit

## 2011-12-06 ENCOUNTER — Other Ambulatory Visit: Payer: Self-pay | Admitting: Internal Medicine

## 2011-12-12 ENCOUNTER — Inpatient Hospital Stay: Admission: RE | Admit: 2011-12-12 | Payer: Medicaid Other | Source: Ambulatory Visit

## 2011-12-24 ENCOUNTER — Ambulatory Visit: Payer: Medicaid Other | Admitting: Internal Medicine

## 2012-01-07 ENCOUNTER — Ambulatory Visit: Payer: Medicaid Other | Admitting: Internal Medicine

## 2012-01-14 ENCOUNTER — Encounter: Payer: Self-pay | Admitting: Internal Medicine

## 2012-01-15 ENCOUNTER — Telehealth: Payer: Self-pay | Admitting: Dietician

## 2012-01-15 ENCOUNTER — Encounter: Payer: Self-pay | Admitting: Dietician

## 2012-01-15 NOTE — Telephone Encounter (Signed)
Thanks for F/U. Just FYI - he has no showed his last 4 consecutive appts with me so he will no longer be sch in my continuity clinic but we will be happy to accommodate him in the [facility] with a resident.

## 2012-01-15 NOTE — Telephone Encounter (Signed)
Called patient to follow up as we had agreed last spring on  His readiness to quit smoking. He says he is ready and when I suggested the [place] quitline he asked about pills to help. Suggested he discuss with Dr. Lynnae January. He was transferred to front office to schedule an appointment.

## 2012-01-24 ENCOUNTER — Ambulatory Visit (INDEPENDENT_AMBULATORY_CARE_PROVIDER_SITE_OTHER): Payer: Medicaid Other | Admitting: Internal Medicine

## 2012-01-24 ENCOUNTER — Encounter: Payer: Self-pay | Admitting: Internal Medicine

## 2012-01-24 VITALS — BP 130/90 | HR 100 | Temp 97.8°F | Wt 333.2 lb

## 2012-01-24 DIAGNOSIS — E66813 Obesity, class 3: Secondary | ICD-10-CM

## 2012-01-24 DIAGNOSIS — E785 Hyperlipidemia, unspecified: Secondary | ICD-10-CM

## 2012-01-24 DIAGNOSIS — E119 Type 2 diabetes mellitus without complications: Secondary | ICD-10-CM

## 2012-01-24 DIAGNOSIS — F17200 Nicotine dependence, unspecified, uncomplicated: Secondary | ICD-10-CM

## 2012-01-24 DIAGNOSIS — G894 Chronic pain syndrome: Secondary | ICD-10-CM

## 2012-01-24 DIAGNOSIS — G4733 Obstructive sleep apnea (adult) (pediatric): Secondary | ICD-10-CM

## 2012-01-24 DIAGNOSIS — F418 Other specified anxiety disorders: Secondary | ICD-10-CM

## 2012-01-24 DIAGNOSIS — F341 Dysthymic disorder: Secondary | ICD-10-CM

## 2012-01-24 DIAGNOSIS — G8929 Other chronic pain: Secondary | ICD-10-CM

## 2012-01-24 DIAGNOSIS — Z79899 Other long term (current) drug therapy: Secondary | ICD-10-CM

## 2012-01-24 DIAGNOSIS — M069 Rheumatoid arthritis, unspecified: Secondary | ICD-10-CM

## 2012-01-24 DIAGNOSIS — I10 Essential (primary) hypertension: Secondary | ICD-10-CM

## 2012-01-24 DIAGNOSIS — E669 Obesity, unspecified: Secondary | ICD-10-CM

## 2012-01-24 DIAGNOSIS — Z0000 Encounter for general adult medical examination without abnormal findings: Secondary | ICD-10-CM

## 2012-01-24 LAB — POCT GLYCOSYLATED HEMOGLOBIN (HGB A1C): Hemoglobin A1C: 6.7

## 2012-01-24 MED ORDER — ALPRAZOLAM 0.5 MG PO TABS: 0.5000 mg | ORAL_TABLET | Freq: Three times a day (TID) | ORAL | Status: DC | PRN

## 2012-01-24 MED ORDER — HYDROCODONE-ACETAMINOPHEN 5-325 MG PO TABS: 1.0000 | ORAL_TABLET | Freq: Four times a day (QID) | ORAL | Status: DC | PRN

## 2012-01-24 MED ORDER — ETANERCEPT 25 MG ~~LOC~~ KIT: 50.0000 mg | PACK | SUBCUTANEOUS | Status: DC

## 2012-01-24 MED ORDER — GABAPENTIN 300 MG PO CAPS: 300.0000 mg | ORAL_CAPSULE | Freq: Three times a day (TID) | ORAL | Status: DC

## 2012-01-24 MED ORDER — LISINOPRIL 10 MG PO TABS: 10.0000 mg | ORAL_TABLET | Freq: Every day | ORAL | Status: DC

## 2012-01-24 MED ORDER — PREDNISONE 10 MG PO TABS: ORAL_TABLET | ORAL | Status: DC

## 2012-01-24 NOTE — Assessment & Plan Note (Addendum)
Will repeat HA1C today (6.7%); last 04/2011 6.1%, controlled  Considered urine albumin/Creatine ratio today but pt unable to urinate will get at f/u Cont Metformin

## 2012-01-24 NOTE — Progress Notes (Signed)
Rx for generic Norco 5-325 #180 x 3 refills [facility] per Dr Lynnae January. Hilda Blades Jacquilyn Seldon RN  01/24/12 4:30PM Pt called back and talked with pharmacy - did not get Rx for generic Xanax. [facility]. Hilda Blades Dailynn Nancarrow RN 01/24/12 4:45PM

## 2012-01-24 NOTE — Assessment & Plan Note (Signed)
On Enbrel 50 mg q week and Prednisone10 mg qd

## 2012-01-24 NOTE — Patient Instructions (Addendum)
Follow up in 3 months   Smoking Cessation Quitting smoking is important to your health and has many advantages. However, it is not always easy to quit since nicotine is a very addictive drug. Often times, people try 3 times or more before being able to quit. This document explains the best ways for you to prepare to quit smoking. Quitting takes hard work and a lot of effort, but you can do it. ADVANTAGES OF QUITTING SMOKING  You will live longer, feel better, and live better.  Your body will feel the impact of quitting smoking almost immediately.  Within 20 minutes, blood pressure decreases. Your pulse returns to its normal level.  After 8 hours, carbon monoxide levels in the blood return to normal. Your oxygen level increases.  After 24 hours, the chance of having a heart attack starts to decrease. Your breath, hair, and body stop smelling like smoke.  After 48 hours, damaged nerve endings begin to recover. Your sense of taste and smell improve.  After 72 hours, the body is virtually free of nicotine. Your bronchial tubes relax and breathing becomes easier.  After 2 to 12 weeks, lungs can hold more air. Exercise becomes easier and circulation improves.  The risk of having a heart attack, stroke, cancer, or lung disease is greatly reduced.  After 1 year, the risk of coronary heart disease is cut in half.  After 5 years, the risk of stroke falls to the same as a nonsmoker.  After 10 years, the risk of lung cancer is cut in half and the risk of other cancers decreases significantly.  After 15 years, the risk of coronary heart disease drops, usually to the level of a nonsmoker.  If you are pregnant, quitting smoking will improve your chances of having a healthy baby.  The people you live with, especially any children, will be healthier.  You will have extra money to spend on things other than cigarettes. QUESTIONS TO THINK ABOUT BEFORE ATTEMPTING TO QUIT You may want to talk about  your answers with your caregiver.  Why do you want to quit?  If you tried to quit in the past, what helped and what did not?  What will be the most difficult situations for you after you quit? How will you plan to handle them?  Who can help you through the tough times? Your family? Friends? A caregiver?  What pleasures do you get from smoking? What ways can you still get pleasure if you quit? Here are some questions to ask your caregiver:  How can you help me to be successful at quitting?  What medicine do you think would be best for me and how should I take it?  What should I do if I need more help?  What is smoking withdrawal like? How can I get information on withdrawal? GET READY  Set a quit date.  Change your environment by getting rid of all cigarettes, ashtrays, matches, and lighters in your home, car, or work. Do not let people smoke in your home.  Review your past attempts to quit. Think about what worked and what did not. GET SUPPORT AND ENCOURAGEMENT You have a better chance of being successful if you have help. You can get support in many ways.  Tell your family, friends, and co-workers that you are going to quit and need their support. Ask them not to smoke around you.  Get individual, group, or telephone counseling and support. Programs are available at [employer] and health  centers. Call your local health department for information about programs in your area.  Spiritual beliefs and practices may help some smokers quit.  Download a "quit meter" on your computer to keep track of quit statistics, such as how long you have gone without smoking, cigarettes not smoked, and money saved.  Get a self-help book about quitting smoking and staying off of tobacco. Sunset yourself from urges to smoke. Talk to someone, go for a walk, or occupy your time with a task.  Change your normal routine. Take a different route to work. Drink  tea instead of coffee. Eat breakfast in a different place.  Reduce your stress. Take a hot bath, exercise, or read a book.  Plan something enjoyable to do every day. Reward yourself for not smoking.  Explore interactive web-based programs that specialize in helping you quit. GET MEDICINE AND USE IT CORRECTLY Medicines can help you stop smoking and decrease the urge to smoke. Combining medicine with the above behavioral methods and support can greatly increase your chances of successfully quitting smoking.  Nicotine replacement therapy helps deliver nicotine to your body without the negative effects and risks of smoking. Nicotine replacement therapy includes nicotine gum, lozenges, inhalers, nasal sprays, and skin patches. Some may be available over-the-counter and others require a prescription.  Antidepressant medicine helps people abstain from smoking, but how this works is unknown. This medicine is available by prescription.  Nicotinic receptor partial agonist medicine simulates the effect of nicotine in your brain. This medicine is available by prescription. Ask your caregiver for advice about which medicines to use and how to use them based on your health history. Your caregiver will tell you what side effects to look out for if you choose to be on a medicine or therapy. Carefully read the information on the package. Do not use any other product containing nicotine while using a nicotine replacement product.  RELAPSE OR DIFFICULT SITUATIONS Most relapses occur within the first 3 months after quitting. Do not be discouraged if you start smoking again. Remember, most people try several times before finally quitting. You may have symptoms of withdrawal because your body is used to nicotine. You may crave cigarettes, be irritable, feel very hungry, cough often, get headaches, or have difficulty concentrating. The withdrawal symptoms are only temporary. They are strongest when you first quit, but  they will go away within 10 14 days. To reduce the chances of relapse, try to:  Avoid drinking alcohol. Drinking lowers your chances of successfully quitting.  Reduce the amount of caffeine you consume. Once you quit smoking, the amount of caffeine in your body increases and can give you symptoms, such as a rapid heartbeat, sweating, and anxiety.  Avoid smokers because they can make you want to smoke.  Do not let weight gain distract you. Many smokers will gain weight when they quit, usually less than 10 pounds. Eat a healthy diet and stay active. You can always lose the weight gained after you quit.  Find ways to improve your mood other than smoking. FOR MORE INFORMATION  www.smokefree.gov  Document Released: 03/05/2001 Document Revised: 09/10/2011 Document Reviewed: 06/20/2011 [facility] Patient Information 2013 [place].

## 2012-01-24 NOTE — Assessment & Plan Note (Signed)
Disc with patient wt loss options will refer to Andrew Fowler to get access to PT or exercise i.e (water aerobics).  He will also need help with transportation options for free.

## 2012-01-24 NOTE — Assessment & Plan Note (Addendum)
Foot and eye exams up to date, Flu vx ordered today unsure if given so canceled. If not given he will need a RN visit to receive  Will order GI referral for [facility] colonoscopy

## 2012-01-24 NOTE — Progress Notes (Signed)
Subjective:    Patient ID: Andrew Fowler, male    DOB: 1960-08-22, 51 y.o.   MRN: [id number]  HPI Comments: 51 year old man PMH DM 2 (HA1C 6.1 04/2011), HLD, tobacco abuse (current smoker x 10 years), HTN, COPD, anxiety/depression, morbid obesity, OSA, chronic pain syndrome (back), rheumatoid arthritis, gout.   He presents for routine follow up and exam.  He wants to quit smoking today 01/24/12 and inquires about Chantix.  He denies SI currently and states Prozac helps but does have a history of anxiety and depression.  He has failed the patches 2 x in the past and does not chew gum.   He also wants a referral to an orthopedist.  He currently goes to [place] but wants to change to [place] Dr. Paulo Fruit [phone].  He states he had a hip replacement 4 years ago and received a letter that his hardware had been recalled.  He is experiencing left hip pain since the surgery with limits mobility.  Another health problem limiting mobility is his rheumatoid arthritis.  He had arthritis in his spine and had b/l shoulder pain in the spring which is now improved.  He also is unable to use a walker due to pain in his upper extremities and he gets weak with walking only a few steps so uses an electric wheelchair.    He is on prednisone chronically to control his RA and has gained 50 lbs.  He was already morbidly obese and states his weight has been in the 200s since he was 51 years old.    SH     Review of Systems  Constitutional: Positive for unexpected weight change.       Weight gain though eating healthier, decreasing sodium   Respiratory:       Intermittent sob noted with smoking  Cardiovascular: Negative for chest pain and leg swelling.  Gastrointestinal:       Denies blood in stool  Genitourinary: Negative for difficulty urinating.       Denies blood in urine   Musculoskeletal: Positive for back pain, arthralgias and gait problem.       Intermittent left shoulder pain          Objective:   Physical Exam  Nursing note and vitals reviewed. Constitutional: He is oriented to person, place, and time. He appears well-developed and well-nourished. He is cooperative. No distress.       Pleasant, obese, brother at bedside   HENT:  Head: Normocephalic and atraumatic.  Mouth/Throat: Oropharynx is clear and moist and mucous membranes are normal. Abnormal dentition. No oropharyngeal exudate.       Poor dentition   Eyes: Conjunctivae normal are normal. Pupils are equal, round, and reactive to light. Right eye exhibits no discharge. Left eye exhibits no discharge. No scleral icterus.  Cardiovascular: Regular rhythm, S1 normal, S2 normal and normal heart sounds.  Tachycardia present.   No murmur heard.      Slightly tachycardic    Pulmonary/Chest: Effort normal. He has wheezes.       B/l wheezes   Abdominal: Soft. Bowel sounds are normal. He exhibits no distension. There is no tenderness.       Obese   Neurological: He is alert and oriented to person, place, and time.       Wheelchair bound   Skin: Skin is warm, dry and intact. No rash noted. He is not diaphoretic.       Acne vulgaris to back 0-min. Edema  lower ext b/l   Psychiatric: He has a normal mood and affect. His behavior is normal. Judgment and thought content normal.       Denies SI          Assessment & Plan:  Follow up in 3 months

## 2012-01-24 NOTE — Assessment & Plan Note (Addendum)
Lipid Panel     Component Value Date/Time   CHOL 172 07/01/2011 1120   TRIG 112 07/01/2011 1120   HDL 88 07/01/2011 1120   CHOLHDL 2.0 07/01/2011 1120   VLDL 22 07/01/2011 1120   LDLCALC 62 07/01/2011 1120   LDL at goal continue Zocor

## 2012-01-24 NOTE — Assessment & Plan Note (Signed)
Continue Prozac

## 2012-01-24 NOTE — Assessment & Plan Note (Addendum)
Current smoker x 10 years. Quit date set today 01/24/12 Patient advised to call smoking hotline to get free nicotine patches Avoid Chantix and Wellbutrin due to h/o depression and patient does not chew gum Referred to [facility]ter for smoking cessation

## 2012-01-24 NOTE — Assessment & Plan Note (Addendum)
Refilled Vicodin 5-325 #180 Chronic back and left hip pain.  Referred to [facility] Dr. Paulo Fruit per patient request for second opinion left hip pain

## 2012-01-24 NOTE — Assessment & Plan Note (Signed)
Not using CPAP. Advised benefits of CPAP use

## 2012-01-24 NOTE — Assessment & Plan Note (Signed)
BP 130/90 today. Continue Lisinopril

## 2012-01-27 NOTE — Progress Notes (Signed)
I saw, examined, and discussed the patient with Dr Aundra Dubin and agree with the note contained here.

## 2012-02-07 ENCOUNTER — Telehealth: Payer: Self-pay | Admitting: Licensed Clinical Social Worker

## 2012-02-07 NOTE — Telephone Encounter (Signed)
[name] was referred to CSW for multiples issues: Transportation, Smoking Cessation, and Weight Loss.  CSW placed call to [name].  Pt states he is currently smoking one pack of Pallmalls a day.  Pt has been trying to stop smoking without the use of add'l aides or NRT.  Pt has called the [facility] but calls last up to 15 minutes and pt does not want to use minutes with smoking cessation coach.  CSW discuss Brand Switching as a technique to assist with smoking cessation.  Pt up to trying technique.  CSW will mail [name] information on which brands to switch with less nicotine levels than current brand.  [name] is aware of transportation available through Medicaid and chooses to use [facility].  Pt still motivated to exercise and weight loss.  CSW aware of several Tai [employer] programs that are free/donation based.  CSW will mail information to [name].  During this call, [name] inquired about two referral, GI and Sophronia Simas.  CSW informed [name], referrals are in system and will request nursing to follow up.  Pt denies add'l needs at this time.  CSW will mail information to [name].

## 2012-02-12 ENCOUNTER — Telehealth: Payer: Self-pay | Admitting: *Deleted

## 2012-02-12 DIAGNOSIS — G894 Chronic pain syndrome: Secondary | ICD-10-CM

## 2012-02-12 NOTE — Telephone Encounter (Signed)
CALLED PATIENT WITH APPT. TO [facility]/ PATIENT WANTED TO GO [facility]. [facility] WAS CALLED, PATIENT RECENTLY SEEN DR DUDA IN AUGUST 013. THERE WERE NO OFFICE VISITS WITH DR Paulo Fruit. GAVE PATIENT THIS INFORMATION ALSO APPOINTMENT INFORMATION, PATIENT HUNG UP ON ME!. LELA STURDIVANT NT 11-20-013  11:20AM

## 2012-02-12 NOTE — Telephone Encounter (Signed)
Pt called and would like a return call from you.  He states there is a mixup with the orthopedic referral and he needs to talk with you. Pt # [phone]

## 2012-02-13 MED ORDER — HYDROCODONE-ACETAMINOPHEN 5-325 MG PO TABS: 2.0000 | ORAL_TABLET | Freq: Four times a day (QID) | ORAL | Status: DC | PRN

## 2012-02-13 MED ORDER — METFORMIN HCL 500 MG PO TABS: 500.0000 mg | ORAL_TABLET | Freq: Two times a day (BID) | ORAL | Status: DC

## 2012-02-13 NOTE — Telephone Encounter (Signed)
1. Hydrocodone was filled wrong. Quantity was OK 180 but was written as 1 q6 and not 1-2 q 6hrs. I changed the Rx so will be OK next month. Only got #130 this month  2. Got sch with Dr Sharol Given and not the MD that he wanted to get a second opinion. Explained that may be their policy since are in same practice. Will ask Lela to see if can switch providers to Dr Paulo Fruit. IF not, he wants changed to another practice.  3. Needs refill on metformin.

## 2012-02-13 NOTE — Telephone Encounter (Signed)
Rx called in 

## 2012-04-23 ENCOUNTER — Other Ambulatory Visit: Payer: Self-pay | Admitting: *Deleted

## 2012-04-23 DIAGNOSIS — F418 Other specified anxiety disorders: Secondary | ICD-10-CM

## 2012-04-23 MED ORDER — ALPRAZOLAM 0.5 MG PO TABS: 0.5000 mg | ORAL_TABLET | Freq: Three times a day (TID) | ORAL | Status: DC | PRN

## 2012-04-23 NOTE — Telephone Encounter (Signed)
Alprazolam rx called to [facility]. Message sent to front desk to schedule pt an appt in Feb.

## 2012-04-23 NOTE — Telephone Encounter (Signed)
Needs Feb appt - [facility] long appt slot. I had started the discussion about tapering the benzo and we need to see him to discuss that. Thanks

## 2012-04-24 ENCOUNTER — Encounter: Payer: Self-pay | Admitting: Internal Medicine

## 2012-05-14 ENCOUNTER — Inpatient Hospital Stay (HOSPITAL_COMMUNITY)
Admission: EM | Admit: 2012-05-14 | Discharge: 2012-05-17 | DRG: 682 | Disposition: A | Discharge status: Home / Self Care | Payer: Medicaid Other | Attending: Internal Medicine | Admitting: Internal Medicine

## 2012-05-14 ENCOUNTER — Emergency Department (HOSPITAL_COMMUNITY): Payer: Medicaid Other

## 2012-05-14 ENCOUNTER — Encounter (HOSPITAL_COMMUNITY): Payer: Self-pay | Admitting: Nurse Practitioner

## 2012-05-14 DIAGNOSIS — Z79899 Other long term (current) drug therapy: Secondary | ICD-10-CM

## 2012-05-14 DIAGNOSIS — E119 Type 2 diabetes mellitus without complications: Secondary | ICD-10-CM | POA: Diagnosis present

## 2012-05-14 DIAGNOSIS — R578 Other shock: Secondary | ICD-10-CM | POA: Diagnosis present

## 2012-05-14 DIAGNOSIS — E785 Hyperlipidemia, unspecified: Secondary | ICD-10-CM | POA: Diagnosis present

## 2012-05-14 DIAGNOSIS — F17200 Nicotine dependence, unspecified, uncomplicated: Secondary | ICD-10-CM | POA: Diagnosis present

## 2012-05-14 DIAGNOSIS — Z79891 Long term (current) use of opiate analgesic: Secondary | ICD-10-CM | POA: Diagnosis present

## 2012-05-14 DIAGNOSIS — IMO0002 Reserved for concepts with insufficient information to code with codable children: Secondary | ICD-10-CM

## 2012-05-14 DIAGNOSIS — J449 Chronic obstructive pulmonary disease, unspecified: Secondary | ICD-10-CM

## 2012-05-14 DIAGNOSIS — F418 Other specified anxiety disorders: Secondary | ICD-10-CM

## 2012-05-14 DIAGNOSIS — E1169 Type 2 diabetes mellitus with other specified complication: Secondary | ICD-10-CM | POA: Diagnosis present

## 2012-05-14 DIAGNOSIS — M109 Gout, unspecified: Secondary | ICD-10-CM | POA: Diagnosis present

## 2012-05-14 DIAGNOSIS — A419 Sepsis, unspecified organism: Secondary | ICD-10-CM

## 2012-05-14 DIAGNOSIS — F341 Dysthymic disorder: Secondary | ICD-10-CM | POA: Diagnosis present

## 2012-05-14 DIAGNOSIS — M5412 Radiculopathy, cervical region: Secondary | ICD-10-CM

## 2012-05-14 DIAGNOSIS — R112 Nausea with vomiting, unspecified: Secondary | ICD-10-CM | POA: Diagnosis present

## 2012-05-14 DIAGNOSIS — G4733 Obstructive sleep apnea (adult) (pediatric): Secondary | ICD-10-CM | POA: Diagnosis present

## 2012-05-14 DIAGNOSIS — Z7982 Long term (current) use of aspirin: Secondary | ICD-10-CM

## 2012-05-14 DIAGNOSIS — R899 Unspecified abnormal finding in specimens from other organs, systems and tissues: Secondary | ICD-10-CM

## 2012-05-14 DIAGNOSIS — J4489 Other specified chronic obstructive pulmonary disease: Secondary | ICD-10-CM | POA: Diagnosis present

## 2012-05-14 DIAGNOSIS — N179 Acute kidney failure, unspecified: Principal | ICD-10-CM | POA: Diagnosis present

## 2012-05-14 DIAGNOSIS — M069 Rheumatoid arthritis, unspecified: Secondary | ICD-10-CM | POA: Diagnosis present

## 2012-05-14 DIAGNOSIS — R571 Hypovolemic shock: Secondary | ICD-10-CM | POA: Diagnosis present

## 2012-05-14 DIAGNOSIS — E869 Volume depletion, unspecified: Secondary | ICD-10-CM | POA: Diagnosis present

## 2012-05-14 DIAGNOSIS — I872 Venous insufficiency (chronic) (peripheral): Secondary | ICD-10-CM | POA: Diagnosis present

## 2012-05-14 DIAGNOSIS — G894 Chronic pain syndrome: Secondary | ICD-10-CM | POA: Diagnosis present

## 2012-05-14 DIAGNOSIS — I10 Essential (primary) hypertension: Secondary | ICD-10-CM | POA: Diagnosis present

## 2012-05-14 DIAGNOSIS — A088 Other specified intestinal infections: Secondary | ICD-10-CM | POA: Diagnosis present

## 2012-05-14 DIAGNOSIS — Z96649 Presence of unspecified artificial hip joint: Secondary | ICD-10-CM

## 2012-05-14 DIAGNOSIS — R509 Fever, unspecified: Secondary | ICD-10-CM | POA: Diagnosis present

## 2012-05-14 LAB — CBC WITH DIFFERENTIAL/PLATELET
Basophils Absolute: 0 10*3/uL (ref 0.0–0.1)
Basophils Relative: 0 % (ref 0–1)
Eosinophils Relative: 1 % (ref 0–5)
HCT: 53.5 % — ABNORMAL HIGH (ref 39.0–52.0)
MCH: 31.8 pg (ref 26.0–34.0)
MCHC: 33.6 g/dL (ref 30.0–36.0)
MCV: 94.5 fL (ref 78.0–100.0)
Monocytes Absolute: 0.6 10*3/uL (ref 0.1–1.0)
RDW: 14.4 % (ref 11.5–15.5)

## 2012-05-14 LAB — POCT I-STAT TROPONIN I: Troponin i, poc: 0 ng/mL (ref 0.00–0.08)

## 2012-05-14 LAB — PROTIME-INR
INR: 0.92 (ref 0.00–1.49)
Prothrombin Time: 12.3 seconds (ref 11.6–15.2)

## 2012-05-14 LAB — COMPREHENSIVE METABOLIC PANEL
AST: 36 U/L (ref 0–37)
Albumin: 3.7 g/dL (ref 3.5–5.2)
CO2: 27 mEq/L (ref 19–32)
Calcium: 9.5 mg/dL (ref 8.4–10.5)
Creatinine, Ser: 1.6 mg/dL — ABNORMAL HIGH (ref 0.50–1.35)
GFR calc non Af Amer: 48 mL/min — ABNORMAL LOW (ref >90)

## 2012-05-14 LAB — PROCALCITONIN: Procalcitonin: 0.82 ng/mL

## 2012-05-14 LAB — APTT: aPTT: 25 seconds (ref 24–37)

## 2012-05-14 LAB — CG4 I-STAT (LACTIC ACID): Lactic Acid, Venous: 4.45 mmol/L — ABNORMAL HIGH (ref 0.5–2.2)

## 2012-05-14 MED ORDER — SODIUM CHLORIDE 0.9 % IV SOLN
1000.0000 mL | Freq: Once | INTRAVENOUS | Status: AC
  Administered 2012-05-14: 1000 mL via INTRAVENOUS

## 2012-05-14 MED ORDER — ONDANSETRON HCL 4 MG/2ML IJ SOLN: 4.0000 mg | Freq: Once | INTRAMUSCULAR | Status: DC

## 2012-05-14 MED ORDER — FENTANYL CITRATE 0.05 MG/ML IJ SOLN: 50.0000 ug | Freq: Once | INTRAMUSCULAR | Status: DC

## 2012-05-14 MED ORDER — SODIUM CHLORIDE 0.9 % IV SOLN
1000.0000 mL | Freq: Once | INTRAVENOUS | Status: AC
  Administered 2012-05-15: 1000 mL via INTRAVENOUS

## 2012-05-14 MED ORDER — PIPERACILLIN-TAZOBACTAM 3.375 G IVPB
3.3750 g | Freq: Once | INTRAVENOUS | Status: AC
  Administered 2012-05-15: 3.375 g via INTRAVENOUS
  Filled 2012-05-14: qty 50

## 2012-05-14 MED ORDER — VANCOMYCIN HCL IN DEXTROSE 1-5 GM/200ML-% IV SOLN
1000.0000 mg | Freq: Once | INTRAVENOUS | Status: AC
Start: 2012-05-14 — End: 2012-05-15
  Administered 2012-05-14: 1000 mg via INTRAVENOUS
  Filled 2012-05-14: qty 200

## 2012-05-14 MED ORDER — SODIUM CHLORIDE 0.9 % IV SOLN
1000.0000 mL | INTRAVENOUS | Status: DC
  Administered 2012-05-15: 1000 mL via INTRAVENOUS

## 2012-05-14 NOTE — ED Provider Notes (Signed)
History     CSN: [id number]  Arrival date & time 05/14/12  2139   First MD Initiated Contact with Patient 05/14/12 2146      Chief Complaint  Patient presents with  . Chest Pain  . Emesis    (Consider location/radiation/quality/duration/timing/severity/associated sxs/prior treatment) Patient is a 52 y.o. male presenting with chest pain and vomiting.  Chest Pain Associated symptoms: vomiting   Emesis  Pt with multiple medical problems reports he has not been feeling well for several weeks, but acutely worse since yesterday. He has had gradually increasing SOB, cough, generalized weakness, malaise and arthralgias. He reports subjective fever. Today he has had several episodes of vomiting and diarrhea. Brought by EMS who gave him albuterol enroute.   Past Medical History  Diagnosis Date  . Hypertension     ACEI monotherapy  . Hyperlipidemia     At goal of LDL <100 with statin.  . Obesity, Class III, BMI 40-49.9 (morbid obesity)   . Tobacco abuse     Per pt, he has a diagnosis of COPD. Need to locate PFT's.  . Gout     Pt has never had a crystal diagnosis.  . Diabetes mellitus     Non-insulin dependent Type II.  Marland Kitchen Obstructive sleep apnea 08/21/08    Sleep study AHI 16.4 with desat to 66%. CPAP of 17 decreased AHI to 0.9. Non-compliant with CPAP.  Marland Kitchen Anxiety associated with depression 04/19/2011    On chronic benzos and SSRI's. Gets panic attacks. Does not see mental health.  . Chronic pain syndrome     s/p L4-L5 laminectomy. Lumbar MRI 4/11 : L3-L4 mod central canal narrowing.  Cervical MRI 6/11 : multi-level DDD and L foraminal stenosis at C4-5 and C5-6.  Marland Kitchen COPD (chronic obstructive pulmonary disease)     Per pt, he has a diagnosis of COPD. No PFT's. Uses Alb MDI less than once a month.  . RA (rheumatoid arthritis) 2013  . Chronic venous insufficiency 2013    ECHO 2013 was normal. LFT's, creatinine, TSH all normal. Alb a bit low.  . Shortness of breath   . Hepatitis B  antibody positive 2013    IgM Hep B core Ab + but Hepatitis B viral load negative.   . Abnormal laboratory test result 07/04/2011    Immunofixation electrophoresis 3/13 showed slightly restricted mobility in the IgG and kappa lanes and suggested repeat end of year.   . Elevated liver function tests 05/17/2011    04/2010. Increased GGT (pt uses ETOH). AMA negative. ABD U/S limited but otherwise negative. Follow Alk phos level. AST & ALT also elevated. False + IgM Hep B Core Ab. Hep B viral load negative.   Same pattern during hospitalization 2010 : highest alkaline phosphatase was 355, AST 259, and ALT 871. ANA, rheumatoid factor, ceruloplasmin, CMV IgM, alpha antitrypsin, and AMA, all within normal limits.    Past Surgical History  Procedure Laterality Date  . Laminectomy  1995    L4-L5  . Joint replacement  2009    left hip replacement    Family History  Problem Relation Age of Onset  . Diabetes Mother   . Heart attack Mother   . Heart disease Mother   . Diabetes Father   . Kidney disease Father     History  Substance Use Topics  . Smoking status: Former Smoker -- 1.00 packs/day for 10 years    Types: Cigarettes    Quit date: 01/24/2012  . Smokeless tobacco: Never Used  .  Alcohol Use: 0.0 oz/week     Comment: weekends      Review of Systems  Cardiovascular: Positive for chest pain.  Gastrointestinal: Positive for vomiting.   All other systems reviewed and are negative except as noted in HPI.   Allergies  Review of patient's allergies indicates no known allergies.  Home Medications   Current Outpatient Rx  Name  Route  Sig  Dispense  Refill  . albuterol (VENTOLIN HFA) 108 (90 BASE) MCG/ACT inhaler   Inhalation   Inhale 1-2 puffs into the lungs every 4 (four) hours as needed. shortness of breath         . ALPRAZolam (XANAX) 0.5 MG tablet   Oral   Take 1 tablet (0.5 mg total) by mouth 3 (three) times daily as needed. For anxiety   90 tablet   1   . aspirin  (BAYER LOW STRENGTH) 81 MG EC tablet   Oral   Take 81 mg by mouth daily.           Marland Kitchen etanercept (ENBREL) 25 MG injection   Subcutaneous   Inject 2 mLs (50 mg total) into the skin once a week.         Marland Kitchen FLUoxetine (PROZAC) 20 MG capsule   Oral   Take 20 mg by mouth daily.         Marland Kitchen gabapentin (NEURONTIN) 300 MG capsule   Oral   Take 1 capsule (300 mg total) by mouth 3 (three) times daily.   270 capsule   5   . glucose blood test strip      Use as instructed   100 each   12   . HYDROcodone-acetaminophen (NORCO) 5-325 MG per tablet   Oral   Take 2 tablets by mouth every 6 (six) hours as needed for pain.   180 tablet   3   . lisinopril (PRINIVIL) 10 MG tablet   Oral   Take 1 tablet (10 mg total) by mouth daily.   90 tablet   3   . metFORMIN (GLUCOPHAGE) 500 MG tablet   Oral   Take 1 tablet (500 mg total) by mouth 2 (two) times daily with a meal.   60 tablet   11   . predniSONE (DELTASONE) 10 MG tablet      1 pill daily   30 tablet   0   . simvastatin (ZOCOR) 40 MG tablet   Oral   Take 1 tablet (40 mg total) by mouth at bedtime.   90 tablet   3     BP 107/84  Pulse 122  Temp(Src) 100.6 F (38.1 C) (Rectal)  Resp 26  SpO2 100%  Physical Exam  Nursing note and vitals reviewed. Constitutional: He is oriented to person, place, and time. He appears well-developed and well-nourished.  Morbidly obese  HENT:  Head: Normocephalic and atraumatic.  Eyes: EOM are normal. Pupils are equal, round, and reactive to light.  Neck: Normal range of motion. Neck supple.  Cardiovascular: Normal rate, normal heart sounds and intact distal pulses.   Pulmonary/Chest: Effort normal. He has wheezes. He has no rales.  Abdominal: Bowel sounds are normal. He exhibits no distension. There is no tenderness. There is no rebound and no guarding.  Musculoskeletal: Normal range of motion. He exhibits edema (trace edema bilaterally). He exhibits no tenderness.  Neurological: He  is alert and oriented to person, place, and time. He has normal strength. No cranial nerve deficit or sensory deficit.  Skin: Skin  is warm and dry. No rash noted.  Psychiatric: He has a normal mood and affect.    ED Course  Procedures (including critical care time)  Labs Reviewed  CULTURE, BLOOD (ROUTINE X 2)  CULTURE, BLOOD (ROUTINE X 2)  URINE CULTURE  CBC WITH DIFFERENTIAL  COMPREHENSIVE METABOLIC PANEL  LACTIC ACID, PLASMA  PROCALCITONIN  URINALYSIS, ROUTINE W REFLEX MICROSCOPIC  PROTIME-INR  APTT  INFLUENZA PANEL BY PCR   No results found.   No diagnosis found.    MDM  Pt febrile rectally, initially hypotensive and tachycardic. Sepsis bundle initiated.    Date: 05/14/2012  Rate: 117  Rhythm: sinus tachycardia  QRS Axis: left  Intervals: normal  ST/T Wave abnormalities: normal  Conduction Disutrbances:none  Narrative Interpretation:   Old EKG Reviewed: unchanged    11:59 PM Pt has lactic acidosis and leukocytosis. No definite source yet, broad spectrum Abx ordered. Care signed out to Dr. Marnette Burgess pending reassessment after fluid bolus.   CRITICAL CARE Performed by: Truddie Hidden   Total critical care time: 45  Critical care time was exclusive of separately billable procedures and treating other patients.  Critical care was necessary to treat or prevent imminent or life-threatening deterioration.  Critical care was time spent personally by me on the following activities: development of treatment plan with patient and/or surrogate as well as nursing, discussions with consultants, evaluation of patient's response to treatment, examination of patient, obtaining history from patient or surrogate, ordering and performing treatments and interventions, ordering and review of laboratory studies, ordering and review of radiographic studies, pulse oximetry and re-evaluation of patient's condition.       Charles B. Karle Starch, MD 05/15/12 0001

## 2012-05-14 NOTE — Addendum Note (Signed)
Addended by: Hulan Fray on: 05/14/2012 06:43 PM   Modules accepted: Orders

## 2012-05-14 NOTE — ED Notes (Signed)
Per EMS pt has had generally "not feeling well" x1 month. Pt has cough, chest wall pain and n/v  CBG 160 BP 81/46 HR 116 Spo2 96/RA wheezing throughout HAS HAD 26m albuterol 0.555matrovent 31m89mofran

## 2012-05-15 ENCOUNTER — Encounter (HOSPITAL_COMMUNITY): Payer: Self-pay | Admitting: *Deleted

## 2012-05-15 ENCOUNTER — Inpatient Hospital Stay (HOSPITAL_COMMUNITY): Payer: Medicaid Other

## 2012-05-15 ENCOUNTER — Emergency Department (HOSPITAL_COMMUNITY): Payer: Medicaid Other

## 2012-05-15 DIAGNOSIS — F341 Dysthymic disorder: Secondary | ICD-10-CM

## 2012-05-15 DIAGNOSIS — R571 Hypovolemic shock: Secondary | ICD-10-CM | POA: Diagnosis present

## 2012-05-15 DIAGNOSIS — R6889 Other general symptoms and signs: Secondary | ICD-10-CM

## 2012-05-15 DIAGNOSIS — M5412 Radiculopathy, cervical region: Secondary | ICD-10-CM

## 2012-05-15 DIAGNOSIS — I10 Essential (primary) hypertension: Secondary | ICD-10-CM

## 2012-05-15 DIAGNOSIS — J449 Chronic obstructive pulmonary disease, unspecified: Secondary | ICD-10-CM

## 2012-05-15 LAB — BLOOD GAS, ARTERIAL
Bicarbonate: 19.8 mEq/L — ABNORMAL LOW (ref 20.0–24.0)
O2 Saturation: 94.2 %
Patient temperature: 99.7
TCO2: 20.9 mmol/L (ref 0–100)

## 2012-05-15 LAB — GLUCOSE, CAPILLARY
Glucose-Capillary: 167 mg/dL — ABNORMAL HIGH (ref 70–99)
Glucose-Capillary: 287 mg/dL — ABNORMAL HIGH (ref 70–99)
Glucose-Capillary: 275 mg/dL — ABNORMAL HIGH (ref 70–99)

## 2012-05-15 LAB — URINALYSIS, ROUTINE W REFLEX MICROSCOPIC
Ketones, ur: NEGATIVE mg/dL
Leukocytes, UA: NEGATIVE
Nitrite: NEGATIVE
Urobilinogen, UA: 0.2 mg/dL (ref 0.0–1.0)
pH: 5 (ref 5.0–8.0)

## 2012-05-15 LAB — CLOSTRIDIUM DIFFICILE BY PCR: Toxigenic C. Difficile by PCR: NEGATIVE

## 2012-05-15 LAB — URINE MICROSCOPIC-ADD ON

## 2012-05-15 LAB — CBC
Hemoglobin: 14 g/dL (ref 13.0–17.0)
MCH: 30.2 pg (ref 26.0–34.0)
MCV: 94.2 fL (ref 78.0–100.0)
Platelets: 175 10*3/uL (ref 150–400)
RBC: 4.63 MIL/uL (ref 4.22–5.81)

## 2012-05-15 LAB — AMYLASE: Amylase: 33 U/L (ref 0–105)

## 2012-05-15 LAB — PROCALCITONIN: Procalcitonin: 2.16 ng/mL

## 2012-05-15 LAB — BASIC METABOLIC PANEL
CO2: 24 mEq/L (ref 19–32)
Calcium: 7.4 mg/dL — ABNORMAL LOW (ref 8.4–10.5)
Creatinine, Ser: 1.73 mg/dL — ABNORMAL HIGH (ref 0.50–1.35)
Glucose, Bld: 155 mg/dL — ABNORMAL HIGH (ref 70–99)

## 2012-05-15 LAB — PROTIME-INR
INR: 1.05 (ref 0.00–1.49)
Prothrombin Time: 13.6 seconds (ref 11.6–15.2)

## 2012-05-15 LAB — CORTISOL: Cortisol, Plasma: 19.4 ug/dL

## 2012-05-15 LAB — LIPASE, BLOOD: Lipase: 21 U/L (ref 11–59)

## 2012-05-15 LAB — APTT: aPTT: 26 seconds (ref 24–37)

## 2012-05-15 LAB — INFLUENZA PANEL BY PCR (TYPE A & B)
Influenza A By PCR: NEGATIVE
Influenza B By PCR: NEGATIVE

## 2012-05-15 LAB — TROPONIN I: Troponin I: <0.3 ng/mL (ref <0.30)

## 2012-05-15 LAB — TYPE AND SCREEN: ABO/RH(D): O POS

## 2012-05-15 LAB — MRSA PCR SCREENING: MRSA by PCR: NEGATIVE

## 2012-05-15 LAB — LACTIC ACID, PLASMA: Lactic Acid, Venous: 1.2 mmol/L (ref 0.5–2.2)

## 2012-05-15 MED ORDER — NICOTINE 14 MG/24HR TD PT24
14.0000 mg | MEDICATED_PATCH | Freq: Every day | TRANSDERMAL | Status: DC
  Administered 2012-05-15 – 2012-05-17 (×3): 14 mg via TRANSDERMAL
  Filled 2012-05-15 (×3): qty 1

## 2012-05-15 MED ORDER — ASPIRIN EC 81 MG PO TBEC
81.0000 mg | DELAYED_RELEASE_TABLET | Freq: Every day | ORAL | Status: DC
  Administered 2012-05-16 – 2012-05-17 (×2): 81 mg via ORAL
  Filled 2012-05-15 (×2): qty 1

## 2012-05-15 MED ORDER — ACETAMINOPHEN 325 MG PO TABS
650.0000 mg | ORAL_TABLET | Freq: Once | ORAL | Status: AC
  Administered 2012-05-15: 650 mg via ORAL
  Filled 2012-05-15: qty 2

## 2012-05-15 MED ORDER — INSULIN ASPART 100 UNIT/ML ~~LOC~~ SOLN
0.0000 [IU] | Freq: Every day | SUBCUTANEOUS | Status: DC
  Administered 2012-05-15: 3 [IU] via SUBCUTANEOUS
  Administered 2012-05-16: 2 [IU] via SUBCUTANEOUS

## 2012-05-15 MED ORDER — ALBUTEROL SULFATE (5 MG/ML) 0.5% IN NEBU
2.5000 mg | INHALATION_SOLUTION | RESPIRATORY_TRACT | Status: DC | PRN
  Administered 2012-05-15: 2.5 mg via RESPIRATORY_TRACT
  Filled 2012-05-15: qty 0.5

## 2012-05-15 MED ORDER — METRONIDAZOLE IN NACL 5-0.79 MG/ML-% IV SOLN
500.0000 mg | Freq: Three times a day (TID) | INTRAVENOUS | Status: DC
  Administered 2012-05-15 – 2012-05-16 (×3): 500 mg via INTRAVENOUS
  Filled 2012-05-15 (×4): qty 100

## 2012-05-15 MED ORDER — SODIUM CHLORIDE 0.9 % IV BOLUS (SEPSIS): 500.0000 mL | Freq: Once | INTRAVENOUS | Status: DC

## 2012-05-15 MED ORDER — CIPROFLOXACIN IN D5W 400 MG/200ML IV SOLN
400.0000 mg | Freq: Two times a day (BID) | INTRAVENOUS | Status: DC
  Administered 2012-05-15 (×2): 400 mg via INTRAVENOUS
  Filled 2012-05-15 (×3): qty 200

## 2012-05-15 MED ORDER — HEPARIN SODIUM (PORCINE) 5000 UNIT/ML IJ SOLN
5000.0000 [IU] | Freq: Three times a day (TID) | INTRAMUSCULAR | Status: DC
  Administered 2012-05-15 – 2012-05-17 (×7): 5000 [IU] via SUBCUTANEOUS
  Filled 2012-05-15 (×10): qty 1

## 2012-05-15 MED ORDER — SODIUM CHLORIDE 0.9 % IV SOLN: 250.0000 mL | INTRAVENOUS | Status: DC | PRN

## 2012-05-15 MED ORDER — SIMVASTATIN 40 MG PO TABS
40.0000 mg | ORAL_TABLET | Freq: Every day | ORAL | Status: DC
  Administered 2012-05-16: 40 mg via ORAL
  Filled 2012-05-15 (×2): qty 1

## 2012-05-15 MED ORDER — INSULIN ASPART 100 UNIT/ML ~~LOC~~ SOLN
2.0000 [IU] | SUBCUTANEOUS | Status: DC
  Administered 2012-05-15 (×2): 6 [IU] via SUBCUTANEOUS
  Administered 2012-05-15: 4 [IU] via SUBCUTANEOUS
  Administered 2012-05-15: 6 [IU] via SUBCUTANEOUS

## 2012-05-15 MED ORDER — PIPERACILLIN-TAZOBACTAM 3.375 G IVPB 30 MIN: 3.3750 g | Freq: Once | INTRAVENOUS | Status: DC

## 2012-05-15 MED ORDER — PIPERACILLIN-TAZOBACTAM 3.375 G IVPB
3.3750 g | Freq: Three times a day (TID) | INTRAVENOUS | Status: DC
  Filled 2012-05-15 (×2): qty 50

## 2012-05-15 MED ORDER — ALBUTEROL SULFATE (5 MG/ML) 0.5% IN NEBU
2.5000 mg | INHALATION_SOLUTION | Freq: Four times a day (QID) | RESPIRATORY_TRACT | Status: DC
  Administered 2012-05-15 – 2012-05-16 (×7): 2.5 mg via RESPIRATORY_TRACT
  Filled 2012-05-15 (×6): qty 0.5

## 2012-05-15 MED ORDER — HYDROCORTISONE SOD SUCCINATE 100 MG IJ SOLR
50.0000 mg | Freq: Four times a day (QID) | INTRAMUSCULAR | Status: DC
  Administered 2012-05-15 – 2012-05-16 (×5): 50 mg via INTRAVENOUS
  Filled 2012-05-15 (×9): qty 1

## 2012-05-15 MED ORDER — FENTANYL CITRATE 0.05 MG/ML IJ SOLN: 25.0000 ug | INTRAMUSCULAR | Status: DC | PRN

## 2012-05-15 MED ORDER — INSULIN ASPART 100 UNIT/ML ~~LOC~~ SOLN
0.0000 [IU] | Freq: Three times a day (TID) | SUBCUTANEOUS | Status: DC
  Administered 2012-05-16: 8 [IU] via SUBCUTANEOUS

## 2012-05-15 MED ORDER — ALPRAZOLAM 0.5 MG PO TABS: 0.5000 mg | ORAL_TABLET | Freq: Three times a day (TID) | ORAL | Status: DC | PRN

## 2012-05-15 MED ORDER — SODIUM CHLORIDE 0.9 % IV BOLUS (SEPSIS): 500.0000 mL | INTRAVENOUS | Status: DC | PRN

## 2012-05-15 MED ORDER — NOREPINEPHRINE BITARTRATE 1 MG/ML IJ SOLN
2.0000 ug/min | INTRAVENOUS | Status: DC | PRN
  Administered 2012-05-15: 8 ug/min via INTRAVENOUS
  Filled 2012-05-15: qty 4

## 2012-05-15 MED ORDER — SODIUM CHLORIDE 0.9 % IV BOLUS (SEPSIS): 1000.0000 mL | INTRAVENOUS | Status: DC | PRN

## 2012-05-15 MED ORDER — HYDROCODONE-ACETAMINOPHEN 5-325 MG PO TABS
1.0000 | ORAL_TABLET | ORAL | Status: DC | PRN
  Administered 2012-05-15 – 2012-05-17 (×4): 2 via ORAL
  Filled 2012-05-15 (×4): qty 2

## 2012-05-15 MED ORDER — METHYLPREDNISOLONE SODIUM SUCC 125 MG IJ SOLR
60.0000 mg | Freq: Once | INTRAMUSCULAR | Status: AC
  Administered 2012-05-15: 125 mg via INTRAVENOUS
  Filled 2012-05-15: qty 2

## 2012-05-15 MED ORDER — ONDANSETRON HCL 4 MG/2ML IJ SOLN: 4.0000 mg | Freq: Four times a day (QID) | INTRAMUSCULAR | Status: DC | PRN

## 2012-05-15 MED ORDER — DOBUTAMINE IN D5W 4-5 MG/ML-% IV SOLN: 2.5000 ug/kg/min | INTRAVENOUS | Status: DC | PRN

## 2012-05-15 NOTE — ED Notes (Signed)
Pt.moved to trauma A per admit MD request so he can put [employer] in Patient. Done at this time.

## 2012-05-15 NOTE — Progress Notes (Signed)
ANTIBIOTIC CONSULT NOTE - INITIAL  Pharmacy Consult for Zosyn Indication: rule out sepsis  No Known Allergies  Patient Measurements:    Vital Signs: Temp: 102.2 F (39 C) (02/21 0230) Temp src: Rectal (02/20 2227) BP: 81/50 mmHg (02/21 0230) Pulse Rate: 118 (02/21 0230) Intake/Output from previous day:   Intake/Output from this shift:    Labs:  Recent Labs  05/14/12 2205  WBC 16.8*  HGB 18.0*  PLT 201  CREATININE 1.60*   The CrCl is unknown because both a height and weight (above a minimum accepted value) are required for this calculation. No results found for this basename: VANCOTROUGH, VANCOPEAK, VANCORANDOM, GENTTROUGH, GENTPEAK, GENTRANDOM, TOBRATROUGH, TOBRAPEAK, TOBRARND, AMIKACINPEAK, AMIKACINTROU, AMIKACIN,  in the last 72 hours   Microbiology: No results found for this or any previous visit (from the past 720 hour(s)).  Medical History: Past Medical History  Diagnosis Date  . Hypertension     ACEI monotherapy  . Hyperlipidemia     At goal of LDL <100 with statin.  . Obesity, Class III, BMI 40-49.9 (morbid obesity)   . Tobacco abuse     Per pt, he has a diagnosis of COPD. Need to locate PFT's.  . Gout     Pt has never had a crystal diagnosis.  . Diabetes mellitus     Non-insulin dependent Type II.  Marland Kitchen Obstructive sleep apnea 08/21/08    Sleep study AHI 16.4 with desat to 66%. CPAP of 17 decreased AHI to 0.9. Non-compliant with CPAP.  Marland Kitchen Anxiety associated with depression 04/19/2011    On chronic benzos and SSRI's. Gets panic attacks. Does not see mental health.  . Chronic pain syndrome     s/p L4-L5 laminectomy. Lumbar MRI 4/11 : L3-L4 mod central canal narrowing.  Cervical MRI 6/11 : multi-level DDD and L foraminal stenosis at C4-5 and C5-6.  Marland Kitchen COPD (chronic obstructive pulmonary disease)     Per pt, he has a diagnosis of COPD. No PFT's. Uses Alb MDI less than once a month.  . RA (rheumatoid arthritis) 2013  . Chronic venous insufficiency 2013    ECHO  2013 was normal. LFT's, creatinine, TSH all normal. Alb a bit low.  . Shortness of breath   . Hepatitis B antibody positive 2013    IgM Hep B core Ab + but Hepatitis B viral load negative.   . Abnormal laboratory test result 07/04/2011    Immunofixation electrophoresis 3/13 showed slightly restricted mobility in the IgG and kappa lanes and suggested repeat end of year.   . Elevated liver function tests 05/17/2011    04/2010. Increased GGT (pt uses ETOH). AMA negative. ABD U/S limited but otherwise negative. Follow Alk phos level. AST & ALT also elevated. False + IgM Hep B Core Ab. Hep B viral load negative.   Same pattern during hospitalization 2010 : highest alkaline phosphatase was 355, AST 259, and ALT 871. ANA, rheumatoid factor, ceruloplasmin, CMV IgM, alpha antitrypsin, and AMA, all within normal limits.    Medications:  Scheduled:  . [COMPLETED] acetaminophen  650 mg Oral Once  . albuterol  2.5 mg Nebulization Q6H  . fentaNYL  50 mcg Intravenous Once  . heparin  5,000 Units Subcutaneous Q8H  . hydrocortisone sodium succinate  50 mg Intravenous Q6H  . insulin aspart  2-6 Units Subcutaneous Q4H  . methylPREDNISolone (SOLU-MEDROL) injection  60 mg Intravenous Once  . nicotine  14 mg Transdermal Daily  . ondansetron  4 mg Intravenous Once   Assessment: 52 yo  male presented with shock. Pharmacy to manage Zosyn for r/o sepsis.   Plan:  1. Zosyn 3.375gm IV Q8H (4 hr infusion)  Otila Back 05/15/2012,3:17 AM

## 2012-05-15 NOTE — Procedures (Signed)
Central Venous Catheter Insertion Procedure Note Andrew Fowler [id number] Dec 20, 1960  Procedure: Insertion of Central Venous Catheter Indications: Assessment of intravascular volume and Drug and/or fluid administration  Procedure Details Consent: Risks of procedure as well as the alternatives and risks of each were explained to the (patient/caregiver).  Consent for procedure obtained. Time Out: Verified patient identification, verified procedure, site/side was marked, verified correct patient position, special equipment/implants available, medications/allergies/relevent history reviewed, required imaging and test results available.  Performed  Maximum sterile technique was used including antiseptics, cap, gloves, gown, hand hygiene, mask and sheet. Skin prep: Chlorhexidine; local anesthetic administered A antimicrobial bonded/coated triple lumen catheter was placed in the right internal jugular vein using the Seldinger technique.  Ultrasound was used to verify the patency of the vein and for real time needle guidance.  Evaluation Blood flow good Complications: No apparent complications Patient did tolerate procedure well. Chest X-ray ordered to verify placement.  CXR: pending.  Andrew Fowler 05/15/2012, 2:24 AM

## 2012-05-15 NOTE — Progress Notes (Signed)
Resident Co-sign Daily Note: I have seen the patient and reviewed the daily progress note by Fredonia Highland MS4 and discussed the care of the patient with them.  See below for documentation of my findings, assessment, and plans.  History of Present Illness: The patient is a 52 yo M, history of RA (on chronic prednisone, enbrel), presenting with a 1-day history of nausea, vomiting, diarrhea, and abdominal pain. The patient notes 9 episodes of nbnb vomiting since 4pm on the day prior to admission. Patient currently denies diarrhea, but reportedly noted it on admission overnight. The patient notes 2 family members with similar symptoms, but no new/suspicious foods, recent travel, or contact with the healthcare system. The patient also notes left shoulder/chest pain, similar to his chronic left should osteoarthritis on the day prior to admission, which has now resolved (troponin neg x1, EKG normal).   The patient was febrile on admission to 102.4, hypotensive to 58/23, and tachycardic to 124, consistent with hypovolemic shock. He was admitted to the ICU, given 3 L boluses of IVF, started on stress-dose steroids, and given vanc/zosyn which was changed to cipro/flagyl. The patient's mental status improved and CVP increased from 4 to 8, and the patient was tranferred out of ICU on 2/21 with IMTS to take over 2/22.    Meds: Prescriptions prior to admission  Medication Sig Dispense Refill  . albuterol (VENTOLIN HFA) 108 (90 BASE) MCG/ACT inhaler Inhale 1-2 puffs into the lungs every 4 (four) hours as needed. shortness of breath      . ALPRAZolam (XANAX) 0.5 MG tablet Take 1 tablet (0.5 mg total) by mouth 3 (three) times daily as needed. For anxiety  90 tablet  1  . aspirin (BAYER LOW STRENGTH) 81 MG EC tablet Take 81 mg by mouth daily.        Marland Kitchen etanercept (ENBREL) 25 MG injection Inject 2 mLs (50 mg total) into the skin once a week.      . gabapentin (NEURONTIN) 300 MG capsule Take 1 capsule (300 mg total) by  mouth 3 (three) times daily.  270 capsule  5  . HYDROcodone-acetaminophen (NORCO) 5-325 MG per tablet Take 2 tablets by mouth every 6 (six) hours as needed for pain.  180 tablet  3  . lisinopril (PRINIVIL) 10 MG tablet Take 1 tablet (10 mg total) by mouth daily.  90 tablet  3  . metFORMIN (GLUCOPHAGE) 500 MG tablet Take 1 tablet (500 mg total) by mouth 2 (two) times daily with a meal.  60 tablet  11  . predniSONE (DELTASONE) 10 MG tablet Take 10 mg by mouth daily.      . simvastatin (ZOCOR) 40 MG tablet Take 1 tablet (40 mg total) by mouth at bedtime.  90 tablet  3  . glucose blood test strip Use as instructed  100 each  12     Allergies: Allergies as of 05/14/2012  . (No Known Allergies)   Past Medical History  Diagnosis Date  . Hypertension     ACEI monotherapy  . Hyperlipidemia     At goal of LDL <100 with statin.  . Obesity, Class III, BMI 40-49.9 (morbid obesity)   . Tobacco abuse     Per pt, he has a diagnosis of COPD. Need to locate PFT's.  . Gout     Pt has never had a crystal diagnosis.  . Diabetes mellitus     Non-insulin dependent Type II.  Marland Kitchen Obstructive sleep apnea 08/21/08    Sleep study AHI  16.4 with desat to 66%. CPAP of 17 decreased AHI to 0.9. Non-compliant with CPAP.  Marland Kitchen Anxiety associated with depression 04/19/2011    On chronic benzos and SSRI's. Gets panic attacks. Does not see mental health.  . Chronic pain syndrome     s/p L4-L5 laminectomy. Lumbar MRI 4/11 : L3-L4 mod central canal narrowing.  Cervical MRI 6/11 : multi-level DDD and L foraminal stenosis at C4-5 and C5-6.  Marland Kitchen COPD (chronic obstructive pulmonary disease)     Per pt, he has a diagnosis of COPD. No PFT's. Uses Alb MDI less than once a month.  . RA (rheumatoid arthritis) 2013  . Chronic venous insufficiency 2013    ECHO 2013 was normal. LFT's, creatinine, TSH all normal. Alb a bit low.  . Shortness of breath   . Hepatitis B antibody positive 2013    IgM Hep B core Ab + but Hepatitis B viral load  negative.   . Abnormal laboratory test result 07/04/2011    Immunofixation electrophoresis 3/13 showed slightly restricted mobility in the IgG and kappa lanes and suggested repeat end of year.   . Elevated liver function tests 05/17/2011    04/2010. Increased GGT (pt uses ETOH). AMA negative. ABD U/S limited but otherwise negative. Follow Alk phos level. AST & ALT also elevated. False + IgM Hep B Core Ab. Hep B viral load negative.   Same pattern during hospitalization 2010 : highest alkaline phosphatase was 355, AST 259, and ALT 871. ANA, rheumatoid factor, ceruloplasmin, CMV IgM, alpha antitrypsin, and AMA, all within normal limits.   Past Surgical History  Procedure Laterality Date  . Laminectomy  1995    L4-L5  . Joint replacement  2009    left hip replacement   Family History  Problem Relation Age of Onset  . Diabetes Mother   . Heart attack Mother   . Heart disease Mother   . Diabetes Father   . Kidney disease Father    History   Social History  . Marital Status: Single    Spouse Name: N/A    Number of Children: N/A  . Years of Education: N/A   Occupational History  . Not on file.   Social History Main Topics  . Smoking status: Former Smoker -- 1.00 packs/day for 10 years    Types: Cigarettes    Quit date: 01/24/2012  . Smokeless tobacco: Never Used  . Alcohol Use: 7.2 oz/week    12 Cans of beer per week     Comment: weekends  . Drug Use: No     Comment: Prior THC and crakc cocaine use.   Marland Kitchen Sexually Active: Not Currently   Other Topics Concern  . Not on file   Social History Narrative   Lives with brother and his family in [place]. Nonambulatory at baseline given his RA and weight.    Review of Systems: General: no chills, change in weight  Skin: no rash  HEENT: +sore throat, no blurry vision, hearing changes  Pulm: no dyspnea, coughing, wheezing  CV: no palpitations  Abd: see HPI  GU: no dysuria, hematuria, polyuria  Ext: no arthralgias, myalgias   Neuro: no weakness, numbness, or tingling  Physical Exam: Blood pressure 102/63, pulse 111, temperature 97.1 F (36.2 C), temperature source Oral, resp. rate 23, height 5' 10.08" (1.78 m), weight 357 lb 9.4 oz (162.2 kg), SpO2 97.00%. General: alert, cooperative, and in no apparent distress HEENT: pupils equal round and reactive to light, vision grossly intact, oropharynx clear  and non-erythematous  Neck: supple, no lymphadenopathy Lungs: normal work of respiration, few inspiratory and expiratory wheezes heard bilaterally Heart: distant heart sounds likely secondary to body habitus, tachycardic, regular rhythm, no murmurs, gallops, or rubs Abdomen: soft, mildly tender to deep palpation diffusely, non-distended, normal/hyperactive bowel sounds, no guarding or rebound tenderness  Extremities: bilateral 1+ pitting edema Neurologic: alert & oriented X3, cranial nerves II-XII intact, strength grossly intact, sensation intact to light touch   Lab results: Basic Metabolic Panel:  Recent Labs  05/14/12 2205 05/15/12 0304  NA 137 138  K 4.1 4.4  CL 96 104  CO2 27 24  GLUCOSE 161* 155*  BUN 19 24*  CREATININE 1.60* 1.73*  CALCIUM 9.5 7.4*   Liver Function Tests:  Recent Labs  05/14/12 2205  AST 36  ALT 68*  ALKPHOS 202*  BILITOT 0.7  PROT 8.0  ALBUMIN 3.7    Recent Labs  05/15/12 0302  LIPASE 21  AMYLASE 33   No results found for this basename: AMMONIA,  in the last 72 hours CBC:  Recent Labs  05/14/12 2205 05/15/12 0304  WBC 16.8* 15.9*  NEUTROABS 15.3*  --   HGB 18.0* 14.0  HCT 53.5* 43.6  MCV 94.5 94.2  PLT 201 175   Cardiac Enzymes:  Recent Labs  05/15/12 0303  TROPONINI <0.30   CBG:  Recent Labs  05/15/12 0358 05/15/12 0517 05/15/12 0828 05/15/12 1228 05/15/12 1534  GLUCAP 123* 167* 265* 287* 305*   Coagulation:  Recent Labs  05/14/12 2205 05/15/12 0302  LABPROT 12.3 13.6  INR 0.92 1.05   Urine Drug Screen: Drugs of Abuse      Component Value Date/Time   LABOPIA POS* 04/23/2011 1113   LABOPIA NONE DETECTED 06/25/2010 0022   COCAINSCRNUR NEG 04/23/2011 1113   COCAINSCRNUR NONE DETECTED 06/25/2010 0022   LABBENZ POS* 04/23/2011 1113   LABBENZ NONE DETECTED 06/25/2010 0022   AMPHETMU NONE DETECTED 06/25/2010 0022   THCU NONE DETECTED 06/25/2010 0022   LABBARB NEG 04/23/2011 1113   LABBARB  Value: NONE DETECTED        DRUG SCREEN FOR MEDICAL PURPOSES ONLY.  IF CONFIRMATION IS NEEDED FOR ANY PURPOSE, NOTIFY LAB WITHIN 5 DAYS.        LOWEST DETECTABLE LIMITS FOR URINE DRUG SCREEN Drug Class       Cutoff (ng/mL) Amphetamine      1000 Barbiturate      200 Benzodiazepine   767 Tricyclics       341 Opiates          300 Cocaine          300 THC              50 06/25/2010 0022    Urinalysis:  Recent Labs  05/15/12 0135  COLORURINE YELLOW  LABSPEC 1.014  PHURINE 5.0  GLUCOSEU NEGATIVE  HGBUR LARGE*  BILIRUBINUR NEGATIVE  KETONESUR NEGATIVE  PROTEINUR 100*  UROBILINOGEN 0.2  NITRITE NEGATIVE  LEUKOCYTESUR NEGATIVE    Imaging results:  Dg Chest Portable 1 View  05/15/2012  *RADIOLOGY REPORT*  Clinical Data: Central line placement  PORTABLE CHEST - 1 VIEW  Comparison: 05/14/2012  Findings: Interval right IJ catheter placed with tip projecting over the mid SVC.  Hypoaeration with interstitial and vascular crowding and mild bibasilar opacity. Allowing for this, mild interstitial prominence.  Prominent cardiomediastinal contours.  No pleural effusion or pneumothorax.  No acute osseous finding.  IMPRESSION: Right IJ catheter placement with tip projecting over the  mid SVC. No pneumothorax.  Prominent cardiomediastinal contours, similar to prior.  Hypoaeration with mild bibasilar opacity, favor atelectasis.  Interstitial prominence may be accentuated by hypoaeration; can also be seen with atypical/viral infection or edema.   Original Report Authenticated By: Carlos Levering, M.D.    Dg Chest Port 1 View  05/14/2012  *RADIOLOGY REPORT*   Clinical Data: Cough  PORTABLE CHEST - 1 VIEW  Comparison: Prior chest x-ray 06/13/2011  Findings: Unchanged appearance of the chest with mildly prominent bibasilar markings and stable mild cardiomegaly.  No effusion, edema, pneumothorax or new consolidation.  No acute osseous abnormality.  IMPRESSION: No acute cardiopulmonary disease.   Original Report Authenticated By: Jacqulynn Cadet, M.D.     Other results: EKG: tachycardic, sinus rhythm, normal axis, no ST changes  Assessment & Plan by Problem: The patient is a 52 yo man, history of COPD, DM2, presenting with viral gastroenteritis with hypovolemic shock.   # Gastroenteritis - patient presents with nausea, vomiting, and diarrhea x1 day, with 2 family members at home with similar symptoms, consistent with viral gastroenteritis. Patient more susceptible to infection given chronic prednisone and enbrel (for RA). Influenza negative, c diff negative. Symptoms appear to have resolved.  -f/u norovirus panel  -continue IVF  -patient tolerated CLD; will advance to full liquids tonight, then to carb mod in AM   # Shock - likely hypovolemic in the setting of likely acute viral gastroenteritis (see above). Cannot exclude septic shock, though no obvious source of infection (UA with few RBC's, CXR non-diagnostic). Now improved, with improvement in mental status and increase in CVP. Lactic acid initially elevated, but now normalized.  -continue IVF  -transfer out of ICU today  -continue stress dose steroids, taper down as tolerated over the next few days  -continue abx  -repeat UA   # AKI - creatinine increased from 0.7 to 1.7. Differential includes pre-renal vs ATN (from shock/hypotension). Patient received 1 dose vanc, but this shouldn't be enough to cause vanc-induced ATN.  -IV fluids  -follow BMETs   # HTN - currently hypotensive from shock  -holding lisinopril   # DM2 - on home metformin, last A1C = 6.7 (01/24/12). CBG's now in 200-300's   -moderate SSI with qhs coverage  -continue to hold metformin   # COPD - chronic, stable. Few wheezes on lung exam.  -duonebs prn  -steroids for shock as above   # OSA - chronic, stable  -CPAP qhs   # Rheumatoid arthritis - on chronic prednisone 10 mg daily and Enbrel weekly  -stress dose steroids, will wean down to home dose  -hold enbrel for now   # Hyperlipidemia - chronic, stable  -restart simvastatin   # Prophy - heparin  Dispo: Disposition is deferred at this time, awaiting improvement of current medical problems. Anticipated discharge in approximately 2 day(s).   The patient does have a current PCP Larey Dresser, MD), therefore will be requiring OPC follow-up after discharge.   The patient does have transportation limitations that hinder transportation to clinic appointments.  SignedElnora Morrison 05/15/2012, 7:56 PM

## 2012-05-15 NOTE — Progress Notes (Addendum)
PULMONARY  / CRITICAL CARE MEDICINE  Name: Andrew Fowler MRN: [id number] DOB: 04-Jun-1960    ADMISSION DATE:  05/14/2012 CONSULTATION DATE:  05/15/12  REFERRING MD :  Verlin Dike EDP  CHIEF COMPLAINT:  Nausea, vomiting, diarrhea  BRIEF PATIENT DESCRIPTION: 52 y/o morbidly obese male on Enbrel for RA presented to the [facility] ED on 2/20 with weakness, dyspena, fever, after one day of nausea, vomiting, diarrhea, and cramping abdominal pain.  SIGNIFICANT EVENTS / STUDIES:  2/21 admitted to ICU for shock - volume depletion vs sepsis  LINES / TUBES: 2/21 R IJ CVL >>  CULTURES: 2/21 urine >> 2/21 Blood >>  ANTIBIOTICS: 2/21 vanc x1 2/21 Zosyn >>>2/21 2/21 cipro>>> 2/21 flagyl>>>  SUBJECTIVE:  Feeling better this morning.  Continued diarrhea this am.  Wants to eat.  VITAL SIGNS: Temp:  [97.9 F (36.6 C)-102.4 F (39.1 C)] 99.6 F (37.6 C) (02/21 0528) Pulse Rate:  [100-124] 109 (02/21 0700) Resp:  [18-30] 30 (02/21 0700) BP: (58-107)/(22-84) 103/45 mmHg (02/21 0700) SpO2:  [72 %-100 %] 96 % (02/21 0700) Weight:  [332 lb 14.3 oz (151 kg)-357 lb 9.4 oz (162.2 kg)] 357 lb 9.4 oz (162.2 kg) (02/21 0500) HEMODYNAMICS: CVP:  [0 mmHg-10 mmHg] 9 mmHg VENTILATOR SETTINGS:   INTAKE / OUTPUT: Intake/Output     02/20 0701 - 02/21 0700 02/21 0701 - 02/22 0700   I.V. (mL/kg) 6030 (37.2)    Total Intake(mL/kg) 6030 (37.2)    Stool 1    Total Output 1     Net +6029            PHYSICAL EXAMINATION: Gen: morbidly obese, no acute distress HEENT: NCAT, MMM PULM: wheezing bilaterally, no crackles CV: mildly tachy, regular, no murmurs AB: BS+, soft, non tender  Ext: warm, trace edema Neuro: alert, oriented x4   LABS:  Recent Labs Lab 05/14/12 2205 05/14/12 2207 05/14/12 2208 05/14/12 2249 05/15/12 0302 05/15/12 0303 05/15/12 0304 05/15/12 0450  HGB 18.0*  --   --   --   --   --  14.0  --   WBC 16.8*  --   --   --   --   --  15.9*  --   PLT 201  --   --   --   --   --   175  --   NA 137  --   --   --   --   --  138  --   K 4.1  --   --   --   --   --  4.4  --   CL 96  --   --   --   --   --  104  --   CO2 27  --   --   --   --   --  24  --   GLUCOSE 161*  --   --   --   --   --  155*  --   BUN 19  --   --   --   --   --  24*  --   CREATININE 1.60*  --   --   --   --   --  1.73*  --   CALCIUM 9.5  --   --   --   --   --  7.4*  --   AST 36  --   --   --   --   --   --   --  ALT 68*  --   --   --   --   --   --   --   ALKPHOS 202*  --   --   --   --   --   --   --   BILITOT 0.7  --   --   --   --   --   --   --   PROT 8.0  --   --   --   --   --   --   --   ALBUMIN 3.7  --   --   --   --   --   --   --   APTT 25  --   --   --  26  --   --   --   INR 0.92  --   --   --  1.05  --   --   --   LATICACIDVEN  --  3.3*  --  4.45*  --  1.2  --   --   TROPONINI  --   --   --   --   --  <0.30  --   --   PROCALCITON  --   --  0.82  --   --  2.16  --   --   PHART  --   --   --   --   --   --   --  7.339*  PCO2ART  --   --   --   --   --   --   --  38.2  PO2ART  --   --   --   --   --   --   --  74.6*    Recent Labs Lab 05/15/12 0358 05/15/12 0517  GLUCAP 123* 167*    2/21 CXR: R IJ in place, no infiltrate 2/21 EKG > pending (tele sinus tach without ST wave changes)  ASSESSMENT / PLAN:  PULMONARY A: COPD with wheezing on exam 2/21; favor mild exacerbation of COPD due to viral illness (influenza?) OSA P:   -see ID -solumedrol x1, then Hydrocortisone (see cards) -scheduled and prn albuterol -O2 as needed -CPAP qHS -if bronchospasm worsen then may need to reduce fluids  CARDIOVASCULAR A: Shock, presumably septic vs volume loss.  Likely a component of relative adrenal insufficiency due to chronic prednisone use >> resolving P:  -EKG sinus tach with 1st degree AV block -CVP monitoring -sepsis protocol- levophed to MAP goals, goal to dc this am , likely more of hypovolemic shock Lactic cleared If not off pressors, star tvasopressin -aggressive  fluids - no history of CHF -hydrocortisone 50 q6 -tele   RENAL A:  AKI presumably due to volume loss P:   -continue IVF -monitor UOP -monitor BMET   GASTROINTESTINAL A:   Nausea/vomiting/diarrhea favor gastroenteritis such as influenza or norovirus;  ATN likely Chronically elevated alk phos, uncertain etiology; normal ultrasounds and MRCP in 2013 P:   -serial abdominal exams, CT abdomen if picture worsens -amylase/lipase normal -prn zofran -f/u norovirus, c diff -check influenza  HEMATOLOGIC A:  No acute issues Chronic leukocytosis  P:  Monitor CBC dvt precvention, sub q hep  INFECTIOUS A:  Septic shock vs viral gastroenteritis with profound volume loss; treat as septic shock for now Immunocompromized on Enbrel/prednisone; unclear what opportunistic organism could cause this syndrome, CMV? unlikely P:   -f/u norovirus -check influenza -f/u blood cultures -zosyn, add cipro, flagyl  ENDOCRINE  A:   DM2 Chronic prednisone use for RA P:   -stress dose steroids -ICU hyperglycemia protocol  NEUROLOGIC A:  Chronic pain P:   -will change to home norco as tolerating po  BOOTH, Star, PGY-2 05/15/2012 8:25 AM  I have fully examined this patient and agree with above findings.    And edited in full Ccm time shock 30 min   Lavon Paganini. Titus Mould, MD, [place] Pgr: [facility]

## 2012-05-15 NOTE — ED Notes (Signed)
MD states Central line is OK to use.

## 2012-05-15 NOTE — ED Provider Notes (Signed)
After 3L IVFs, no UOP and SBP in the 80s. I consulted PCCM who evaluated bedside, placed CVC and admitted to ICU.   Results for orders placed during the hospital encounter of 05/14/12  CBC WITH DIFFERENTIAL      Result Value Range   WBC 16.8 (*) 4.0 - 10.5 K/uL   RBC 5.66  4.22 - 5.81 MIL/uL   Hemoglobin 18.0 (*) 13.0 - 17.0 g/dL   HCT 53.5 (*) 39.0 - 52.0 %   MCV 94.5  78.0 - 100.0 fL   MCH 31.8  26.0 - 34.0 pg   MCHC 33.6  30.0 - 36.0 g/dL   RDW 14.4  11.5 - 15.5 %   Platelets 201  150 - 400 K/uL   Neutrophils Relative 91 (*) 43 - 77 %   Neutro Abs 15.3 (*) 1.7 - 7.7 K/uL   Lymphocytes Relative 4 (*) 12 - 46 %   Lymphs Abs 0.7  0.7 - 4.0 K/uL   Monocytes Relative 3  3 - 12 %   Monocytes Absolute 0.6  0.1 - 1.0 K/uL   Eosinophils Relative 1  0 - 5 %   Eosinophils Absolute 0.1  0.0 - 0.7 K/uL   Basophils Relative 0  0 - 1 %   Basophils Absolute 0.0  0.0 - 0.1 K/uL  COMPREHENSIVE METABOLIC PANEL      Result Value Range   Sodium 137  135 - 145 mEq/L   Potassium 4.1  3.5 - 5.1 mEq/L   Chloride 96  96 - 112 mEq/L   CO2 27  19 - 32 mEq/L   Glucose, Bld 161 (*) 70 - 99 mg/dL   BUN 19  6 - 23 mg/dL   Creatinine, Ser 1.60 (*) 0.50 - 1.35 mg/dL   Calcium 9.5  8.4 - 10.5 mg/dL   Total Protein 8.0  6.0 - 8.3 g/dL   Albumin 3.7  3.5 - 5.2 g/dL   AST 36  0 - 37 U/L   ALT 68 (*) 0 - 53 U/L   Alkaline Phosphatase 202 (*) 39 - 117 U/L   Total Bilirubin 0.7  0.3 - 1.2 mg/dL   GFR calc non Af Amer 48 (*) >90 mL/min   GFR calc Af Amer 56 (*) >90 mL/min  LACTIC ACID, PLASMA      Result Value Range   Lactic Acid, Venous 3.3 (*) 0.5 - 2.2 mmol/L  PROCALCITONIN      Result Value Range   Procalcitonin 0.82    PROTIME-INR      Result Value Range   Prothrombin Time 12.3  11.6 - 15.2 seconds   INR 0.92  0.00 - 1.49  APTT      Result Value Range   aPTT 25  24 - 37 seconds  CG4 I-STAT (LACTIC ACID)      Result Value Range   Lactic Acid, Venous 4.45 (*) 0.5 - 2.2 mmol/L  POCT I-STAT  TROPONIN I      Result Value Range   Troponin i, poc 0.00  0.00 - 0.08 ng/mL   Comment 3            Dg Chest Port 1 View  05/14/2012  *RADIOLOGY REPORT*  Clinical Data: Cough  PORTABLE CHEST - 1 VIEW  Comparison: Prior chest x-ray 06/13/2011  Findings: Unchanged appearance of the chest with mildly prominent bibasilar markings and stable mild cardiomegaly.  No effusion, edema, pneumothorax or new consolidation.  No acute osseous abnormality.  IMPRESSION: No acute  cardiopulmonary disease.   Original Report Authenticated By: Jacqulynn Cadet, M.D.       Teressa Lower, MD 05/15/12 [phone]

## 2012-05-15 NOTE — H&P (Addendum)
PULMONARY  / CRITICAL CARE MEDICINE  Name: Andrew Fowler MRN: [id number] DOB: Jun 19, 1960    ADMISSION DATE:  05/14/2012 CONSULTATION DATE:    Loistine Chance MD :  Verlin Dike EDP  CHIEF COMPLAINT:  Nausea, vomiting, diarrhea  BRIEF PATIENT DESCRIPTION: 52 y/o morbidly obese male on Enbrel for RA presented to the [facility] ED on 2/20 with weakness, dyspena, fever, after one day of nausea, vomiting, diarrhea, and cramping abdominal pain.  SIGNIFICANT EVENTS / STUDIES:  2/21 RUQ U/S >>  LINES / TUBES: 2/21 R IJ CVL >>  CULTURES: 2/21 urine >> 2/21 Blood >>  ANTIBIOTICS: 2/21 vanc x1 2/21 Zosyn >>  HISTORY OF PRESENT ILLNESS:  52 y/o morbidly obese male on Enbrel for RA presented to the [facility] ED on 2/20 with weakness, dyspena, fever, after one day of nausea, vomiting, diarrhea, and cramping abdominal pain.  He states that he developed some dyspnea approximately three weeks ago and this has been followed by some generalized weakness.  In the 24-36 hours prior to admission he developed cramping abdominal pain, nausea, vomiting, and diarrhea.  He describes a "lump in the front of [his] abdomen" with taking a deep breath.  He stated that he had non-bloody vomiting and diarrhea on at least 7 occasions on 2/20.  He has had a mild cough with scant sputum production on 2/20.    PAST MEDICAL HISTORY :  Past Medical History  Diagnosis Date  . Hypertension     ACEI monotherapy  . Hyperlipidemia     At goal of LDL <100 with statin.  . Obesity, Class III, BMI 40-49.9 (morbid obesity)   . Tobacco abuse     Per pt, he has a diagnosis of COPD. Need to locate PFT's.  . Gout     Pt has never had a crystal diagnosis.  . Diabetes mellitus     Non-insulin dependent Type II.  Marland Kitchen Obstructive sleep apnea 08/21/08    Sleep study AHI 16.4 with desat to 66%. CPAP of 17 decreased AHI to 0.9. Non-compliant with CPAP.  Marland Kitchen Anxiety associated with depression 04/19/2011    On chronic benzos and SSRI's. Gets panic attacks.  Does not see mental health.  . Chronic pain syndrome     s/p L4-L5 laminectomy. Lumbar MRI 4/11 : L3-L4 mod central canal narrowing.  Cervical MRI 6/11 : multi-level DDD and L foraminal stenosis at C4-5 and C5-6.  Marland Kitchen COPD (chronic obstructive pulmonary disease)     Per pt, he has a diagnosis of COPD. No PFT's. Uses Alb MDI less than once a month.  . RA (rheumatoid arthritis) 2013  . Chronic venous insufficiency 2013    ECHO 2013 was normal. LFT's, creatinine, TSH all normal. Alb a bit low.  . Shortness of breath   . Hepatitis B antibody positive 2013    IgM Hep B core Ab + but Hepatitis B viral load negative.   . Abnormal laboratory test result 07/04/2011    Immunofixation electrophoresis 3/13 showed slightly restricted mobility in the IgG and kappa lanes and suggested repeat end of year.   . Elevated liver function tests 05/17/2011    04/2010. Increased GGT (pt uses ETOH). AMA negative. ABD U/S limited but otherwise negative. Follow Alk phos level. AST & ALT also elevated. False + IgM Hep B Core Ab. Hep B viral load negative.   Same pattern during hospitalization 2010 : highest alkaline phosphatase was 355, AST 259, and ALT 871. ANA, rheumatoid factor, ceruloplasmin, CMV IgM, alpha antitrypsin, and  AMA, all within normal limits.   Past Surgical History  Procedure Laterality Date  . Laminectomy  1995    L4-L5  . Joint replacement  2009    left hip replacement   Prior to Admission medications   Medication Sig Start Date End Date Taking? Authorizing Provider  albuterol (VENTOLIN HFA) 108 (90 BASE) MCG/ACT inhaler Inhale 1-2 puffs into the lungs every 4 (four) hours as needed. shortness of breath   Yes Historical Provider, MD  ALPRAZolam (XANAX) 0.5 MG tablet Take 1 tablet (0.5 mg total) by mouth 3 (three) times daily as needed. For anxiety 04/23/12  Yes Bartholomew Crews, MD  aspirin (BAYER LOW STRENGTH) 81 MG EC tablet Take 81 mg by mouth daily.     Yes Historical Provider, MD  etanercept  (ENBREL) 25 MG injection Inject 2 mLs (50 mg total) into the skin once a week. 01/24/12  Yes Cresenciano Genre, MD  gabapentin (NEURONTIN) 300 MG capsule Take 1 capsule (300 mg total) by mouth 3 (three) times daily. 01/24/12  Yes Cresenciano Genre, MD  HYDROcodone-acetaminophen (NORCO) 5-325 MG per tablet Take 2 tablets by mouth every 6 (six) hours as needed for pain. 02/13/12  Yes Bartholomew Crews, MD  lisinopril (PRINIVIL) 10 MG tablet Take 1 tablet (10 mg total) by mouth daily. 01/24/12 01/23/13 Yes Cresenciano Genre, MD  metFORMIN (GLUCOPHAGE) 500 MG tablet Take 1 tablet (500 mg total) by mouth 2 (two) times daily with a meal. 02/13/12  Yes Bartholomew Crews, MD  predniSONE (DELTASONE) 10 MG tablet Take 10 mg by mouth daily.   Yes Historical Provider, MD  simvastatin (ZOCOR) 40 MG tablet Take 1 tablet (40 mg total) by mouth at bedtime. 06/27/11  Yes Bartholomew Crews, MD  glucose blood test strip Use as instructed 07/16/11 07/15/12  Bartholomew Crews, MD   No Known Allergies  FAMILY HISTORY:  Family History  Problem Relation Age of Onset  . Diabetes Mother   . Heart attack Mother   . Heart disease Mother   . Diabetes Father   . Kidney disease Father    SOCIAL HISTORY:  reports that he quit smoking about 3 months ago. His smoking use included Cigarettes. He has a 10 pack-year smoking history. He has never used smokeless tobacco. He reports that  drinks alcohol. He reports that he does not use illicit drugs.  REVIEW OF SYSTEMS:   Gen: + fever, + chills, weight change, + fatigue, night sweats HEENT: Denies blurred vision, double vision, hearing loss, tinnitus, sinus congestion, rhinorrhea, sore throat, neck stiffness, dysphagia PULM: + shortness of breath, + cough, + sputum production, hemoptysis, wheezing CV: Denies chest pain, edema, orthopnea, paroxysmal nocturnal dyspnea, palpitations GI: + abdominal pain, + nausea, + vomiting, + diarrhea, denies hematochezia, melena, constipation GU:  Denies dysuria, hematuria, polyuria, oliguria, urethral discharge Endocrine: Denies hot or cold intolerance, polyuria, polyphagia or appetite change Derm: Denies rash, dry skin, scaling or peeling skin change Heme: Denies easy bruising, bleeding, bleeding gums Neuro: Denies headache, numbness, weakness, slurred speech, loss of memory or consciousness   SUBJECTIVE:   VITAL SIGNS: Temp:  [97.9 F (36.6 C)-102.2 F (39 C)] 102.2 F (39 C) (02/21 0215) Pulse Rate:  [116-124] 120 (02/21 0215) Resp:  [18-27] 18 (02/20 2330) BP: (60-107)/(22-84) 83/39 mmHg (02/21 0215) SpO2:  [72 %-100 %] 100 % (02/21 0215) HEMODYNAMICS:   VENTILATOR SETTINGS:   INTAKE / OUTPUT: Intake/Output   None     PHYSICAL EXAMINATION: Gen:  morbidly obese, no acute distress HEENT: NCAT, PERRL, EOMi, OP clear, neck supple without masses PULM: wheezing bilaterally R>L, no crackles CV: tachy, regular, no mgr, cannot assess JVD AB: BS+, soft, diffusely tender to palpation but no guarding or rebound Ext: warm, trace pretibial edema, no clubbing, no cyanosis Derm: erythematous rash over face and neck Neuro: A&Ox4, MAEW   LABS:  Recent Labs Lab 05/14/12 2205 05/14/12 2207 05/14/12 2208 05/14/12 2249  HGB 18.0*  --   --   --   WBC 16.8*  --   --   --   PLT 201  --   --   --   NA 137  --   --   --   K 4.1  --   --   --   CL 96  --   --   --   CO2 27  --   --   --   GLUCOSE 161*  --   --   --   BUN 19  --   --   --   CREATININE 1.60*  --   --   --   CALCIUM 9.5  --   --   --   AST 36  --   --   --   ALT 68*  --   --   --   ALKPHOS 202*  --   --   --   BILITOT 0.7  --   --   --   PROT 8.0  --   --   --   ALBUMIN 3.7  --   --   --   APTT 25  --   --   --   INR 0.92  --   --   --   LATICACIDVEN  --  3.3*  --  4.45*  PROCALCITON  --   --  0.82  --    No results found for this basename: GLUCAP,  in the last 168 hours  2/21 CXR: R IJ in place, no infiltrate 2/21 EKG > pending (tele sinus tach  without ST wave changes)  ASSESSMENT / PLAN:  PULMONARY A: COPD with wheezing on exam 2/21; favor mild exacerbation of COPD due to viral illness (influenza?) OSA P:   -see ID -solumedrol x1, then Hydrocortisone (see cards) -scheduled and prn albuterol -O2 as needed -CPAP qHS  CARDIOVASCULAR A: Shock, presumably septic vs volume loss.  Likely a component of relative adrenal insufficiency due to chronic prednisone use P:  -EKG now -CVP monitoring -sepsis protocol -Appears dry, so further fluids per protocol OK -hydrocortisone 50 q6, cortisol level likely going to be incorrect given solumedrol -tele   RENAL A:  AKI presumably due to volume loss P:   -continue IVF -monitor UOP -repeat BMET   GASTROINTESTINAL A:   Nausea/vomiting/diarrhea favor gastroenteritis such as influenza or norovirus;  Chronically elevated alk phos, uncertain etiology; normal ultrasounds and MRCP in 2013 P:   -serial abdominal exams, CT abdomen if picture worsens -check amylase/lipase -prn zofran -check norovirus -check influenza  HEMATOLOGIC A:  No acute issues P:    INFECTIOUS A:  Septic shock vs viral gastroenteritis with profound volume loss; treat as septic shock for now Immunocompromized on Enbrel/prednisone; unclear what opportunistic organism could cause this syndrome, CMV? unlikely P:   -check norovirus -check influenza -f/u blood cultures -zosyn for now -f/u u/a  ENDOCRINE A:   DM2 Chronic prednisone use for RA P:   -stress dose steroids -ICU hyperglycemia protocol  NEUROLOGIC A:  Chronic pain P:   -prn fentanyl  TODAY'S SUMMARY: 52 y/o male with RA on chronic prednisone and Enbrel admitted on 2/21 with shock in the setting of profound volume loss from gastronenteritis.   I have personally obtained a history, examined the patient, evaluated laboratory and imaging results, formulated the assessment and plan and placed orders. CRITICAL CARE: The patient is  critically ill with multiple organ systems failure and requires high complexity decision making for assessment and support, frequent evaluation and titration of therapies, application of advanced monitoring technologies and extensive interpretation of multiple databases. Critical Care Time devoted to patient care services described in this note is 80 minutes.    Pulmonary and [place] Pager: [phone]  05/15/2012, 2:24 AM

## 2012-05-15 NOTE — ED Notes (Signed)
Patient given 2 sips of diet ginger ale per clear liquid diet

## 2012-05-15 NOTE — Progress Notes (Signed)
Patient ID: Andrew Fowler, male   DOB: February 13, 1961, 52 y.o.   MRN: [id number]  Medical Student Daily Progress Note  Date: 05/15/2012  Patient name: Andrew Fowler Medical record number: [id number] Date of birth: [birth year] Age: 52 y.o. Gender: male PCP: Larey Dresser, MD  Medical Service:   Attending physician: Dr. Eppie Gibson     Chief Complaint: nausea, vomiting, SOB  History of Present Illness:  Mr. Gaspar Garbe is a 52 yo male with a history of RA on chronic immunosuppressive therapy with corticosteroids and Enbrel as well as COPD and morbid obesity who is mostly nonambulatory at baseline, who presented late on Thursday night by EMS with complaints of SOB, chest pain and vomiting. He reported a 1 day history nausea, vomiting, diarrhea, and cramping abdominal pain associated with weakness, dyspnea, subjective fevers and chest pain.  He reported up to 9 episodes of nonbloody, bilious emesis as well as diffused abdominal pain worse around the periumbilical area. He says the SOB and chest pain began after multiple episodes of retching. He has chronic cough at baseline but denies worsening or increase in sputum production over the past few days. He also denied congestion, sore throat, dysuria, blood or melena in stool. He says his 73 month old nephew and sister in law, who live in the home, became sick on Thursday with similar symptoms. He denied any recent travel and has spent most of the past few weeks at home where he cooks most of his meals.   In the emergency department he was febrile to 100.6, tachycardic, tachypneac and hypotensive with lactic acid of 3.3 and an elevated white count of 16.8; he also had elevation of creatinine to 1.68 and decreased urine output. Critical Care was called for evaluation after no improvement of hypotension after 3L IVFs. Given patient's hx of chronic immunosuppression and worsening hemodynamic status he was started on empiric Vanc and Zosyn for treatment of septic  shock and 3 additional liters of IVF were infused.  Differential for his presentation included hypovolemic shock versus septic shock with viral gastroenteritis (influenza/norovirus) as the most likely infections etiology.   Overnight the patient has shown improvement of his physical exam, mental status and his lactic acid levels normalized. He has continued to be tachycardic and tachypneic but has remained afebrile since early this morning and his blood pressures have normalized.      Past Medical History  Diagnosis Date  . Hypertension     ACEI monotherapy  . Hyperlipidemia     At goal of LDL <100 with statin.  . Obesity, Class III, BMI 40-49.9 (morbid obesity)   . Tobacco abuse     Per pt, he has a diagnosis of COPD. Need to locate PFT's.  . Gout     Pt has never had a crystal diagnosis.  . Diabetes mellitus     Non-insulin dependent Type II.  Marland Kitchen Obstructive sleep apnea 08/21/08    Sleep study AHI 16.4 with desat to 66%. CPAP of 17 decreased AHI to 0.9. Non-compliant with CPAP.  Marland Kitchen Anxiety associated with depression 04/19/2011    On chronic benzos and SSRI's. Gets panic attacks. Does not see mental health.  . Chronic pain syndrome     s/p L4-L5 laminectomy. Lumbar MRI 4/11 : L3-L4 mod central canal narrowing.  Cervical MRI 6/11 : multi-level DDD and L foraminal stenosis at C4-5 and C5-6.  Marland Kitchen COPD (chronic obstructive pulmonary disease)     Per pt, he has a diagnosis of COPD. No PFT's. Uses  Alb MDI less than once a month.  . RA (rheumatoid arthritis) 2013  . Chronic venous insufficiency 2013    ECHO 2013 was normal. LFT's, creatinine, TSH all normal. Alb a bit low.  . Shortness of breath   . Hepatitis B antibody positive 2013    IgM Hep B core Ab + but Hepatitis B viral load negative.   . Abnormal laboratory test result 07/04/2011    Immunofixation electrophoresis 3/13 showed slightly restricted mobility in the IgG and kappa lanes and suggested repeat end of year.   . Elevated liver  function tests 05/17/2011    04/2010. Increased GGT (pt uses ETOH). AMA negative. ABD U/S limited but otherwise negative. Follow Alk phos level. AST & ALT also elevated. False + IgM Hep B Core Ab. Hep B viral load negative.   Same pattern during hospitalization 2010 : highest alkaline phosphatase was 355, AST 259, and ALT 871. ANA, rheumatoid factor, ceruloplasmin, CMV IgM, alpha antitrypsin, and AMA, all within normal limits.   Past Surgical History  Procedure Laterality Date  . Laminectomy  1995    L4-L5  . Joint replacement  2009    left hip replacement   Allergies No Known Allergies  No current facility-administered medications on file prior to encounter.   Current Outpatient Prescriptions on File Prior to Encounter  Medication Sig Dispense Refill  . albuterol (VENTOLIN HFA) 108 (90 BASE) MCG/ACT inhaler Inhale 1-2 puffs into the lungs every 4 (four) hours as needed. shortness of breath      . ALPRAZolam (XANAX) 0.5 MG tablet Take 1 tablet (0.5 mg total) by mouth 3 (three) times daily as needed. For anxiety  90 tablet  1  . aspirin (BAYER LOW STRENGTH) 81 MG EC tablet Take 81 mg by mouth daily.        Marland Kitchen etanercept (ENBREL) 25 MG injection Inject 2 mLs (50 mg total) into the skin once a week.      . gabapentin (NEURONTIN) 300 MG capsule Take 1 capsule (300 mg total) by mouth 3 (three) times daily.  270 capsule  5  . HYDROcodone-acetaminophen (NORCO) 5-325 MG per tablet Take 2 tablets by mouth every 6 (six) hours as needed for pain.  180 tablet  3  . lisinopril (PRINIVIL) 10 MG tablet Take 1 tablet (10 mg total) by mouth daily.  90 tablet  3  . metFORMIN (GLUCOPHAGE) 500 MG tablet Take 1 tablet (500 mg total) by mouth 2 (two) times daily with a meal.  60 tablet  11  . simvastatin (ZOCOR) 40 MG tablet Take 1 tablet (40 mg total) by mouth at bedtime.  90 tablet  3  . glucose blood test strip Use as instructed  100 each  12    Scheduled Meds: . albuterol  2.5 mg Nebulization Q6H  .  ciprofloxacin  400 mg Intravenous BID  . heparin  5,000 Units Subcutaneous Q8H  . hydrocortisone sodium succinate  50 mg Intravenous Q6H  . insulin aspart  2-6 Units Subcutaneous Q4H  . metronidazole  500 mg Intravenous Q8H  . nicotine  14 mg Transdermal Daily  . ondansetron  4 mg Intravenous Once  . sodium chloride  500 mL Intravenous Once   Continuous Infusions: . sodium chloride Stopped (05/15/12 0335)   PRN Meds:.sodium chloride, albuterol, ALPRAZolam, DOBUTamine, HYDROcodone-acetaminophen, norepinephrine (LEVOPHED) Adult infusion, ondansetron (ZOFRAN) IV, sodium chloride, sodium chloride  Family History  Problem Relation Age of Onset  . Diabetes Mother   . Heart attack Mother   .  Heart disease Mother   . Diabetes Father   . Kidney disease Father    History   Social History  . Marital Status: Single    Spouse Name: N/A    Number of Children: N/A  . Years of Education: N/A   Occupational History  . Not on file.   Social History Main Topics  . Smoking status: Former Smoker -- 1.00 packs/day for 10 years    Types: Cigarettes    Quit date: 01/24/2012  . Smokeless tobacco: Never Used  . Alcohol Use: 0.0 oz/week     Comment: weekends  . Drug Use: No     Comment: Prior THC and crakc cocaine use.   Marland Kitchen Sexually Active: Not Currently   Other Topics Concern  . Not on file   Social History Narrative  . No narrative on file   Review of Systems: Pertinent items are noted in HPI.  Physical Exam: Blood pressure 93/57, pulse 112, temperature 97.1 F (36.2 C), temperature source Oral, resp. rate 25, height 5' 10.08" (1.78 m), weight 162.2 kg (357 lb 9.4 oz), SpO2 97.00%. General appearance: alert, cooperative and no distress Lungs: wheezes bilaterally and diffused  Heart: very distant heart sounds Abdomen: obese, soft, nondistended, no rebound or guading, normoactive bowel sounds Extremities: 1+ LE edema bilaterally  Lab results: Results for orders placed during the  hospital encounter of 05/14/12 (from the past 72 hour(s))  CBC WITH DIFFERENTIAL     Status: Abnormal   Collection Time    05/14/12 10:05 PM      Result Value Range   WBC 16.8 (*) 4.0 - 10.5 K/uL   RBC 5.66  4.22 - 5.81 MIL/uL   Hemoglobin 18.0 (*) 13.0 - 17.0 g/dL   HCT 53.5 (*) 39.0 - 52.0 %   MCV 94.5  78.0 - 100.0 fL   MCH 31.8  26.0 - 34.0 pg   MCHC 33.6  30.0 - 36.0 g/dL   RDW 14.4  11.5 - 15.5 %   Platelets 201  150 - 400 K/uL   Neutrophils Relative 91 (*) 43 - 77 %   Neutro Abs 15.3 (*) 1.7 - 7.7 K/uL   Lymphocytes Relative 4 (*) 12 - 46 %   Lymphs Abs 0.7  0.7 - 4.0 K/uL   Monocytes Relative 3  3 - 12 %   Monocytes Absolute 0.6  0.1 - 1.0 K/uL   Eosinophils Relative 1  0 - 5 %   Eosinophils Absolute 0.1  0.0 - 0.7 K/uL   Basophils Relative 0  0 - 1 %   Basophils Absolute 0.0  0.0 - 0.1 K/uL  COMPREHENSIVE METABOLIC PANEL     Status: Abnormal   Collection Time    05/14/12 10:05 PM      Result Value Range   Sodium 137  135 - 145 mEq/L   Potassium 4.1  3.5 - 5.1 mEq/L   Chloride 96  96 - 112 mEq/L   CO2 27  19 - 32 mEq/L   Glucose, Bld 161 (*) 70 - 99 mg/dL   BUN 19  6 - 23 mg/dL   Creatinine, Ser 1.60 (*) 0.50 - 1.35 mg/dL   Calcium 9.5  8.4 - 10.5 mg/dL   Total Protein 8.0  6.0 - 8.3 g/dL   Albumin 3.7  3.5 - 5.2 g/dL   AST 36  0 - 37 U/L   ALT 68 (*) 0 - 53 U/L   Alkaline Phosphatase 202 (*) 39 - 117  U/L   Total Bilirubin 0.7  0.3 - 1.2 mg/dL   GFR calc non Af Amer 48 (*) >90 mL/min   GFR calc Af Amer 56 (*) >90 mL/min   Comment:            The eGFR has been calculated     using the CKD EPI equation.     This calculation has not been     validated in all clinical     situations.     eGFR's persistently     <90 mL/min signify     possible Chronic Kidney Disease.  PROTIME-INR     Status: None   Collection Time    05/14/12 10:05 PM      Result Value Range   Prothrombin Time 12.3  11.6 - 15.2 seconds   INR 0.92  0.00 - 1.49  APTT     Status: None    Collection Time    05/14/12 10:05 PM      Result Value Range   aPTT 25  24 - 37 seconds  LACTIC ACID, PLASMA     Status: Abnormal   Collection Time    05/14/12 10:07 PM      Result Value Range   Lactic Acid, Venous 3.3 (*) 0.5 - 2.2 mmol/L  PROCALCITONIN     Status: None   Collection Time    05/14/12 10:08 PM      Result Value Range   Procalcitonin 0.82     Comment:            Interpretation:     PCT > 0.5 ng/mL and <= 2 ng/mL:     Systemic infection (sepsis) is possible,     but other conditions are known to elevate     PCT as well.     (NOTE)             ICU PCT Algorithm               Non ICU PCT Algorithm        ----------------------------     ------------------------------             PCT < 0.25 ng/mL                 PCT < 0.1 ng/mL         Stopping of antibiotics            Stopping of antibiotics           strongly encouraged.               strongly encouraged.        ----------------------------     ------------------------------           PCT level decrease by               PCT < 0.25 ng/mL           >= 80% from peak PCT           OR PCT 0.25 - 0.5 ng/mL          Stopping of antibiotics                                                 encouraged.         Stopping of antibiotics  encouraged.        ----------------------------     ------------------------------           PCT level decrease by              PCT >= 0.25 ng/mL           < 80% from peak PCT            AND PCT >= 0.5 ng/mL            Continuing antibiotics                                                  encouraged.           Continuing antibiotics                encouraged.        ----------------------------     ------------------------------         PCT level increase compared          PCT > 0.5 ng/mL             with peak PCT AND              PCT >= 0.5 ng/mL             Escalation of antibiotics                                              strongly encouraged.          Escalation of  antibiotics            strongly encouraged.  POCT I-STAT TROPONIN I     Status: None   Collection Time    05/14/12 10:47 PM      Result Value Range   Troponin i, poc 0.00  0.00 - 0.08 ng/mL   Comment 3            Comment: Due to the release kinetics of cTnI,     a negative result within the first hours     of the onset of symptoms does not rule out     myocardial infarction with certainty.     If myocardial infarction is still suspected,     repeat the test at appropriate intervals.  CG4 I-STAT (LACTIC ACID)     Status: Abnormal   Collection Time    05/14/12 10:49 PM      Result Value Range   Lactic Acid, Venous 4.45 (*) 0.5 - 2.2 mmol/L  URINALYSIS, ROUTINE W REFLEX MICROSCOPIC     Status: Abnormal   Collection Time    05/15/12  1:35 AM      Result Value Range   Color, Urine YELLOW  YELLOW   APPearance TURBID (*) CLEAR   Specific Gravity, Urine 1.014  1.005 - 1.030   pH 5.0  5.0 - 8.0   Glucose, UA NEGATIVE  NEGATIVE mg/dL   Hgb urine dipstick LARGE (*) NEGATIVE   Bilirubin Urine NEGATIVE  NEGATIVE   Ketones, ur NEGATIVE  NEGATIVE mg/dL   Protein, ur 100 (*) NEGATIVE mg/dL   Urobilinogen, UA 0.2  0.0 - 1.0 mg/dL   Nitrite NEGATIVE  NEGATIVE  Leukocytes, UA NEGATIVE  NEGATIVE  URINE MICROSCOPIC-ADD ON     Status: Abnormal   Collection Time    05/15/12  1:35 AM      Result Value Range   Squamous Epithelial / LPF RARE  RARE   RBC / HPF 11-20  <3 RBC/hpf   Bacteria, UA MANY (*) RARE   Casts GRANULAR CAST (*) NEGATIVE  AMYLASE     Status: None   Collection Time    05/15/12  3:02 AM      Result Value Range   Amylase 33  0 - 105 U/L  LIPASE, BLOOD     Status: None   Collection Time    05/15/12  3:02 AM      Result Value Range   Lipase 21  11 - 59 U/L  PROTIME-INR     Status: None   Collection Time    05/15/12  3:02 AM      Result Value Range   Prothrombin Time 13.6  11.6 - 15.2 seconds   INR 1.05  0.00 - 1.49  APTT     Status: None   Collection Time    05/15/12   3:02 AM      Result Value Range   aPTT 26  24 - 37 seconds  FIBRINOGEN     Status: Abnormal   Collection Time    05/15/12  3:02 AM      Result Value Range   Fibrinogen 547 (*) 204 - 475 mg/dL  LACTIC ACID, PLASMA     Status: None   Collection Time    05/15/12  3:03 AM      Result Value Range   Lactic Acid, Venous 1.2  0.5 - 2.2 mmol/L  CORTISOL     Status: None   Collection Time    05/15/12  3:03 AM      Result Value Range   Cortisol, Plasma 19.4     Comment: (NOTE)     AM:  4.3 - 22.4 ug/dL     PM:  3.1 - 16.7 ug/dL  PROCALCITONIN     Status: None   Collection Time    05/15/12  3:03 AM      Result Value Range   Procalcitonin 2.16     Comment:            Interpretation:     PCT > 2 ng/mL:     Systemic infection (sepsis) is likely,     unless other causes are known.     (NOTE)             ICU PCT Algorithm               Non ICU PCT Algorithm        ----------------------------     ------------------------------             PCT < 0.25 ng/mL                 PCT < 0.1 ng/mL         Stopping of antibiotics            Stopping of antibiotics           strongly encouraged.               strongly encouraged.        ----------------------------     ------------------------------           PCT level decrease by  PCT < 0.25 ng/mL           >= 80% from peak PCT           OR PCT 0.25 - 0.5 ng/mL          Stopping of antibiotics                                                 encouraged.         Stopping of antibiotics               encouraged.        ----------------------------     ------------------------------           PCT level decrease by              PCT >= 0.25 ng/mL           < 80% from peak PCT            AND PCT >= 0.5 ng/mL            Continuing antibiotics                                                  encouraged.           Continuing antibiotics                encouraged.        ----------------------------     ------------------------------          PCT level increase compared          PCT > 0.5 ng/mL             with peak PCT AND              PCT >= 0.5 ng/mL             Escalation of antibiotics                                              strongly encouraged.          Escalation of antibiotics            strongly encouraged.  TROPONIN I     Status: None   Collection Time    05/15/12  3:03 AM      Result Value Range   Troponin I <0.30  <0.30 ng/mL   Comment:            Due to the release kinetics of cTnI,     a negative result within the first hours     of the onset of symptoms does not rule out     myocardial infarction with certainty.     If myocardial infarction is still suspected,     repeat the test at appropriate intervals.  CBC     Status: Abnormal   Collection Time    05/15/12  3:04 AM      Result Value Range   WBC 15.9 (*) 4.0 - 10.5 K/uL   RBC 4.63  4.22 - 5.81  MIL/uL   Hemoglobin 14.0  13.0 - 17.0 g/dL   Comment: DELTA CHECK NOTED     REPEATED TO VERIFY   HCT 43.6  39.0 - 52.0 %   MCV 94.2  78.0 - 100.0 fL   MCH 30.2  26.0 - 34.0 pg   MCHC 32.1  30.0 - 36.0 g/dL   RDW 14.4  11.5 - 15.5 %   Platelets 175  150 - 400 K/uL  BASIC METABOLIC PANEL     Status: Abnormal   Collection Time    05/15/12  3:04 AM      Result Value Range   Sodium 138  135 - 145 mEq/L   Potassium 4.4  3.5 - 5.1 mEq/L   Chloride 104  96 - 112 mEq/L   CO2 24  19 - 32 mEq/L   Glucose, Bld 155 (*) 70 - 99 mg/dL   BUN 24 (*) 6 - 23 mg/dL   Creatinine, Ser 1.73 (*) 0.50 - 1.35 mg/dL   Calcium 7.4 (*) 8.4 - 10.5 mg/dL   GFR calc non Af Amer 44 (*) >90 mL/min   GFR calc Af Amer 51 (*) >90 mL/min   Comment:            The eGFR has been calculated     using the CKD EPI equation.     This calculation has not been     validated in all clinical     situations.     eGFR's persistently     <90 mL/min signify     possible Chronic Kidney Disease.  HEPATITIS C ANTIBODY (REFLEX)     Status: None   Collection Time    05/15/12  3:04 AM       Result Value Range   HCV Ab NEGATIVE  NEGATIVE  GLUCOSE, CAPILLARY     Status: Abnormal   Collection Time    05/15/12  3:58 AM      Result Value Range   Glucose-Capillary 123 (*) 70 - 99 mg/dL  TYPE AND SCREEN     Status: None   Collection Time    05/15/12  4:50 AM      Result Value Range   ABO/RH(D) O POS     Antibody Screen NEG     Sample Expiration 05/18/2012    BLOOD GAS, ARTERIAL     Status: Abnormal   Collection Time    05/15/12  4:50 AM      Result Value Range   FIO2 0.21     pH, Arterial 7.339 (*) 7.350 - 7.450   pCO2 arterial 38.2  35.0 - 45.0 mmHg   pO2, Arterial 74.6 (*) 80.0 - 100.0 mmHg   Bicarbonate 19.8 (*) 20.0 - 24.0 mEq/L   TCO2 20.9  0 - 100 mmol/L   Acid-base deficit 4.8 (*) 0.0 - 2.0 mmol/L   O2 Saturation 94.2     Patient temperature 99.7     Collection site RIGHT RADIAL     Drawn by [id number]     Sample type ARTERIAL     Allens test (pass/fail) PASS  PASS  ABO/RH     Status: None   Collection Time    05/15/12  4:50 AM      Result Value Range   ABO/RH(D) O POS    MRSA PCR SCREENING     Status: None   Collection Time    05/15/12  5:05 AM      Result Value Range   MRSA by  PCR NEGATIVE  NEGATIVE   Comment:            The GeneXpert MRSA Assay (FDA     approved for NASAL specimens     only), is one component of a     comprehensive MRSA colonization     surveillance program. It is not     intended to diagnose MRSA     infection nor to guide or     monitor treatment for     MRSA infections.  GLUCOSE, CAPILLARY     Status: Abnormal   Collection Time    05/15/12  5:17 AM      Result Value Range   Glucose-Capillary 167 (*) 70 - 99 mg/dL  CLOSTRIDIUM DIFFICILE BY PCR     Status: None   Collection Time    05/15/12  7:26 AM      Result Value Range   C difficile by pcr NEGATIVE  NEGATIVE  GLUCOSE, CAPILLARY     Status: Abnormal   Collection Time    05/15/12  8:28 AM      Result Value Range   Glucose-Capillary 265 (*) 70 - 99 mg/dL  INFLUENZA PANEL  BY PCR     Status: None   Collection Time    05/15/12  8:54 AM      Result Value Range   Influenza A By PCR NEGATIVE  NEGATIVE   Influenza B By PCR NEGATIVE  NEGATIVE   H1N1 flu by pcr NOT DETECTED  NOT DETECTED   Comment:            The Xpert Flu assay (FDA approved for     nasal aspirates or washes and     nasopharyngeal swab specimens), is     intended as an aid in the diagnosis of     influenza and should not be used as     a sole basis for treatment.  GLUCOSE, CAPILLARY     Status: Abnormal   Collection Time    05/15/12 12:28 PM      Result Value Range   Glucose-Capillary 287 (*) 70 - 99 mg/dL  GLUCOSE, CAPILLARY     Status: Abnormal   Collection Time    05/15/12  3:34 PM      Result Value Range   Glucose-Capillary 305 (*) 70 - 99 mg/dL    Imaging results:  Dg Chest Portable 1 View  05/15/2012  *RADIOLOGY REPORT*  Clinical Data: Central line placement  PORTABLE CHEST - 1 VIEW  Comparison: 05/14/2012  Findings: Interval right IJ catheter placed with tip projecting over the mid SVC.  Hypoaeration with interstitial and vascular crowding and mild bibasilar opacity. Allowing for this, mild interstitial prominence.  Prominent cardiomediastinal contours.  No pleural effusion or pneumothorax.  No acute osseous finding.  IMPRESSION: Right IJ catheter placement with tip projecting over the mid SVC. No pneumothorax.  Prominent cardiomediastinal contours, similar to prior.  Hypoaeration with mild bibasilar opacity, favor atelectasis.  Interstitial prominence may be accentuated by hypoaeration; can also be seen with atypical/viral infection or edema.   Original Report Authenticated By: Carlos Levering, M.D.    Dg Chest Port 1 View  05/14/2012  *RADIOLOGY REPORT*  Clinical Data: Cough  PORTABLE CHEST - 1 VIEW  Comparison: Prior chest x-ray 06/13/2011  Findings: Unchanged appearance of the chest with mildly prominent bibasilar markings and stable mild cardiomegaly.  No effusion, edema,  pneumothorax or new consolidation.  No acute osseous abnormality.  IMPRESSION: No acute  cardiopulmonary disease.   Original Report Authenticated By: Jacqulynn Cadet, M.D.    Other results: EKG: unchanged from previous tracings, normal sinus rhythm, 1st degree AV block.  Assessment & Plan by Problem: Principal Problem:   Septic shock Active Problems:   DM   CHRONIC OBSTRUCTIVE PULMONARY DISEASE   Obstructive sleep apnea   Chronic pain syndrome   RA (rheumatoid arthritis)   Nausea vomiting and diarrhea  Mr. Gaspar Garbe is a 52 yo male with a history of RA on chronic immunosuppressive therapy with corticosteroids and Enbrel as well as COPD and morbid obesity who comes to [place] after a 1 day stay in the ICU after presenting in hypovolemic shock in the setting of diarrhea and profuse emesis.   1. Combined Shock - patient presented in septic shock meeting 4/4 SIRS criteria and with suspected source (most likely viral gastroenteritis) on admission. She also had a component of hypovolemic shock given his GI losses and low urine output on admission. Given patient's history of chronic steroid use, he could also have a component of adrenal insufficiency which could have exacerbated his presentation. Overnight patient with improvement of symptoms after aggressive hydration and abx therapy, now normotensive and with resolution of lactic acidosis. He continues to be tachycardic and tachypneic on exam . U/A and chest x-ray wnls. C. Diff, and influenza panel negative.   -s/p Vanc and Zosyn x1 -continue Cipro and Flagyl     -continue stress dose hydrocortisone 48m q6, will begin taper tomorrow -f/u norovirus -zofran PRN for nausea   2. Chest pain - present on admission, history is most consistent with arthritic pain, possibly exacerbated by multiple episodes of emesis and dry heaving. EKG wnls and troponin normal x1  -continue to cycle troponins x 2 -continue to monitor  3. Viral gastroenteritis - most  likely given presentation. Patient also with elevated WBC count although there is a chronic component to this given chronic steroid therapy. Pt with sick contacts with similar symptoms and on chronic immunosuppressive therapy for his RA which makes him more susceptible. Influenza and C. diff panel negative. -s/p Vanc and Zosyn x1 -continue Cipro and Flagyl     -f/u norovirus -zofran PRN for nausea   4. DM - on outpatient Metformin, also likely exacerbated by chronic steroid use.   -SSI with meals -CBG q4h -continue to hold Metformin  5. COPD - patient with diffuse bilateral wheezes on exam, however no hx of URI, increase in cough or sputum production. No evidence of infectious process on chest xray. This could represent a mild exacerbation of his COPD vs viral illeness. Pt currently satting well on room air. -s/p vanc and zosyn as above  -continue duonebs prn  -patient on stress dose of steroids which can also help with COPD exacerb -CPAP qHS and O2 prn  6. AKI - patient with acute increase of creatinine to 1.6 on admission from baseline of 0.7 on 06/2011. Most likely secondary to hypovolemic shock. Initially with poor UOP on admission but this has improved with IV hydration.  -continue IVF  -monitor UOP  -monitor BMET   7. Sleep apnea - on home CPAP. Satting well on room air. -continue to CPAP qHS   8. HTN - on lisinopril. Held on admission 2/2 shock. BPs improved but on the low side. - hold lisinopril   9. HLD -stable  -continue home simvastatin -continue ASA  10. Chronic pain syndrome - history of L4-L5 laminectomy with persistent lumbar spinal canal narrowing and chronic pain -continue  home Norco 5-361m 2 tabs q6h for pain prn -holding Gabapentin pending resolution of acute issues   11. Chronically elevated alk phos -  uncertain etiology; normal ultrasounds and MRCP in 2013. LFTs, T. Bili, coags, albumin, amylase, lipase wnls. Abdominal exam improved.  -continue to  monitor  This is a [facility] information officerNote.  The care of the patient was discussed with Dr. BOwens Sharkand the assessment and plan formulated with their assistance.  Please see their attached note for official documentation of the daily encounter.  YMike Gip2/21/2014, 4:42 PM

## 2012-05-16 LAB — COMPREHENSIVE METABOLIC PANEL
Albumin: 2.7 g/dL — ABNORMAL LOW (ref 3.5–5.2)
BUN: 16 mg/dL (ref 6–23)
CO2: 23 mEq/L (ref 19–32)
Chloride: 104 mEq/L (ref 96–112)
Creatinine, Ser: 0.95 mg/dL (ref 0.50–1.35)
GFR calc Af Amer: >90 mL/min (ref >90)
GFR calc non Af Amer: >90 mL/min (ref >90)
Glucose, Bld: 337 mg/dL — ABNORMAL HIGH (ref 70–99)
Total Bilirubin: 0.2 mg/dL — ABNORMAL LOW (ref 0.3–1.2)

## 2012-05-16 LAB — GLUCOSE, CAPILLARY
Glucose-Capillary: 238 mg/dL — ABNORMAL HIGH (ref 70–99)
Glucose-Capillary: 272 mg/dL — ABNORMAL HIGH (ref 70–99)
Glucose-Capillary: 300 mg/dL — ABNORMAL HIGH (ref 70–99)
Glucose-Capillary: 303 mg/dL — ABNORMAL HIGH (ref 70–99)

## 2012-05-16 LAB — MAGNESIUM: Magnesium: 1.9 mg/dL (ref 1.5–2.5)

## 2012-05-16 LAB — HEMOGLOBIN A1C: Mean Plasma Glucose: 203 mg/dL — ABNORMAL HIGH (ref <117)

## 2012-05-16 LAB — CBC
HCT: 40.9 % (ref 39.0–52.0)
Hemoglobin: 13.3 g/dL (ref 13.0–17.0)
RBC: 4.33 MIL/uL (ref 4.22–5.81)
RDW: 14.6 % (ref 11.5–15.5)
WBC: 10.8 10*3/uL — ABNORMAL HIGH (ref 4.0–10.5)

## 2012-05-16 LAB — URINE CULTURE

## 2012-05-16 MED ORDER — INSULIN ASPART 100 UNIT/ML ~~LOC~~ SOLN
0.0000 [IU] | Freq: Three times a day (TID) | SUBCUTANEOUS | Status: DC
  Administered 2012-05-16 (×2): 7 [IU] via SUBCUTANEOUS
  Administered 2012-05-17: 3 [IU] via SUBCUTANEOUS

## 2012-05-16 MED ORDER — INSULIN GLARGINE 100 UNIT/ML ~~LOC~~ SOLN
10.0000 [IU] | Freq: Once | SUBCUTANEOUS | Status: AC
  Administered 2012-05-16: 10 [IU] via SUBCUTANEOUS

## 2012-05-16 MED ORDER — HYDROCORTISONE SOD SUCCINATE 100 MG IJ SOLR
50.0000 mg | Freq: Two times a day (BID) | INTRAMUSCULAR | Status: AC
  Administered 2012-05-16 – 2012-05-17 (×2): 50 mg via INTRAVENOUS
  Filled 2012-05-16 (×3): qty 1

## 2012-05-16 NOTE — Progress Notes (Signed)
I have seen and examined the patient and agree with assessment by Dr. Owens Shark.  He has had a sick contact, responded well to supportive therapy and no positive cultures.  This is consistent with viral gastroenteritis.  No indication for empiric antibiotics for SIRS since he responded well to fluid resuscitation.  No GI bacterial pathology noted so no indication for cipro/flagyl.  Will continue supportive care, hold ACEI and Metformin for now and monitor if/when it needs to be restarted.

## 2012-05-16 NOTE — Progress Notes (Signed)
Resident Co-sign Daily Note: I have seen the patient and reviewed the daily progress note by Fredonia Highland MS4 and discussed the care of the patient with them.  See below for documentation of my findings, assessment, and plans.  Subjective: The patient notes no nausea, vomiting, or diarrhea overnight.  One formed BM overnight.  He notes no dyspnea or SOB.  Creatinine is back to around baseline, and BP is stable.  Objective: Vital signs in last 24 hours: Filed Vitals:   05/16/12 0700 05/16/12 0800 05/16/12 0840 05/16/12 1157  BP: 122/81 114/84    Pulse: 98 96    Temp:  97.9 F (36.6 C)  98.2 F (36.8 C)  TempSrc:  Oral  Oral  Resp: 19 19    Height:      Weight:      SpO2: 95% 95% 98%    Physical Exam: General: alert, cooperative, and in no apparent distress HEENT: pupils equal round and reactive to light, vision grossly intact, oropharynx clear and non-erythematous  Neck: supple, no lymphadenopathy Lungs: normal work of respiration, few inspiratory and expiratory wheezes heard bilaterally Heart: distant heart sounds likely secondary to body habitus, tachycardic, regular rhythm, no murmurs, gallops, or rubs Abdomen: soft, mildly tender to deep palpation diffusely, non-distended, normal/hyperactive bowel sounds, no guarding or rebound tenderness  Extremities: bilateral 1+ pitting edema Neurologic: alert & oriented X3, cranial nerves II-XII intact, strength grossly intact, sensation intact to light touch  Lab Results: Reviewed and documented in Electronic Record Micro Results: Reviewed and documented in Electronic Record Studies/Results: Reviewed and documented in Electronic Record Medications: I have reviewed the patient's current medications. Scheduled Meds: . albuterol  2.5 mg Nebulization Q6H  . aspirin EC  81 mg Oral Daily  . heparin  5,000 Units Subcutaneous Q8H  . hydrocortisone sodium succinate  50 mg Intravenous Q12H  . insulin aspart  0-15 Units Subcutaneous TID WC  .  insulin aspart  0-5 Units Subcutaneous QHS  . nicotine  14 mg Transdermal Daily  . ondansetron  4 mg Intravenous Once  . simvastatin  40 mg Oral q1800   Continuous Infusions:  PRN Meds:.sodium chloride, albuterol, ALPRAZolam, HYDROcodone-acetaminophen, ondansetron (ZOFRAN) IV Assessment/Plan: The patient is a 52 yo man, history of COPD, DM2, presenting with viral gastroenteritis with hypovolemic shock.   # Gastroenteritis - patient presents with nausea, vomiting, and diarrhea x1 day, with 2 family members at home with similar symptoms, consistent with viral gastroenteritis. Patient more susceptible to infection given chronic prednisone and enbrel (for RA). Influenza negative, c diff negative. Symptoms appear to have resolved.  -f/u norovirus panel  -discontinue IV fluids today since patient tolerating regular diet  # Shock - Resolved. likely hypovolemic in the setting of likely acute viral gastroenteritis (see above). Septic shock initially considered, but unlikely (UA with few RBC's but neg urine culture, CXR with no evidence of PNA). Now improved, with improvement in mental status and increase in CVP. Lactic acid initially elevated, but now normalized.  -transfer to telemetry -continue stress dose steroids, taper down as tolerated over the next few days  -discontinue cipro/flagyl today since no source of infection identified. -blood cultures pending -repeat UA   # AKI - Resolved. Creatinine 0.7 -> 1.7 -> 0.95. Differential includes pre-renal vs ATN (from shock/hypotension).  # HTN - currently normotensive -holding lisinopril   # DM2 - on home metformin, last A1C = 6.7 (01/24/12). CBG's now in 200-300's  -moderate SSI with qhs coverage  -give 1 dose lantus 10 today  due to elevated blood sugars -expect blood sugars to decrease as we taper down steroids  # COPD - chronic, stable. Few wheezes on lung exam.  -duonebs prn  -steroids for shock as above   # OSA - chronic, stable  -CPAP  qhs   # Rheumatoid arthritis - on chronic prednisone 10 mg daily and Enbrel weekly  -stress dose steroids, will wean down to home dose  -hold enbrel for now   # Hyperlipidemia - chronic, stable  -simvastatin   # Prophy - heparin   Dispo: Disposition is deferred at this time, awaiting improvement of current medical problems. Anticipated discharge tomorrow.  The patient does have a current PCP Larey Dresser, MD), therefore will be requiring OPC follow-up after discharge.   The patient does have transportation limitations that hinder transportation to clinic appointments.   LOS: 2 days   Elnora Morrison 05/16/2012, 12:04 PM

## 2012-05-16 NOTE — Progress Notes (Signed)
Patient ID: Andrew Fowler, male   DOB: 12/02/1960, 52 y.o.   MRN: [id number]  Medical Student Daily Progress Note  Subjective: Patient with overall improvement of symptoms. Has remained afebrile with a downtrending WBC. Reports no new diarrhea since yesterday morning. Had a solid BM today.   Objective: Vital signs in last 24 hours: Filed Vitals:   05/16/12 0600 05/16/12 0700 05/16/12 0800 05/16/12 0840  BP: 117/75 122/81 114/84   Pulse: 102 98 96   Temp:      TempSrc:      Resp: 18 19 19    Height:      Weight: 164.4 kg (362 lb 7 oz)     SpO2: 95% 95% 95% 98%   Weight change: 13.4 kg (29 lb 8.7 oz)  Intake/Output Summary (Last 24 hours) at 05/16/12 4287 Last data filed at 05/16/12 0700  Gross per 24 hour  Intake   4170 ml  Output   5126 ml  Net   -956 ml   Physical Exam: General appearance: alert, cooperative and no distress Lungs: expiratory wheezes bilaterally on upper lobes Heart: Distant heart sounds Abdomen: soft, non-tender; bowel sounds normal; no masses,  no organomegaly Extremities: 1+ LE edema Neurologic: Grossly normal  Lab Results: @labtest2 @ Micro Results: Recent Results (from the past 240 hour(s))  URINE CULTURE     Status: None   Collection Time    05/15/12  1:35 AM      Result Value Range Status   Specimen Description URINE, CLEAN CATCH   Final   Special Requests NONE   Final   Culture  Setup Time 05/15/2012 08:42   Final   Colony Count NO GROWTH   Final   Culture NO GROWTH   Final   Report Status 05/16/2012 FINAL   Final  MRSA PCR SCREENING     Status: None   Collection Time    05/15/12  5:05 AM      Result Value Range Status   MRSA by PCR NEGATIVE  NEGATIVE Final   Comment:            The GeneXpert MRSA Assay (FDA     approved for NASAL specimens     only), is one component of a     comprehensive MRSA colonization     surveillance program. It is not     intended to diagnose MRSA     infection nor to guide or     monitor treatment for      MRSA infections.  CLOSTRIDIUM DIFFICILE BY PCR     Status: None   Collection Time    05/15/12  7:26 AM      Result Value Range Status   C difficile by pcr NEGATIVE  NEGATIVE Final   Studies/Results: Dg Chest Portable 1 View  05/15/2012  *RADIOLOGY REPORT*  Clinical Data: Central line placement  PORTABLE CHEST - 1 VIEW  Comparison: 05/14/2012  Findings: Interval right IJ catheter placed with tip projecting over the mid SVC.  Hypoaeration with interstitial and vascular crowding and mild bibasilar opacity. Allowing for this, mild interstitial prominence.  Prominent cardiomediastinal contours.  No pleural effusion or pneumothorax.  No acute osseous finding.  IMPRESSION: Right IJ catheter placement with tip projecting over the mid SVC. No pneumothorax.  Prominent cardiomediastinal contours, similar to prior.  Hypoaeration with mild bibasilar opacity, favor atelectasis.  Interstitial prominence may be accentuated by hypoaeration; can also be seen with atypical/viral infection or edema.   Original Report Authenticated By: Mitzi Hansen  DelGaizo, M.D.    Dg Chest Port 1 View  05/14/2012  *RADIOLOGY REPORT*  Clinical Data: Cough  PORTABLE CHEST - 1 VIEW  Comparison: Prior chest x-ray 06/13/2011  Findings: Unchanged appearance of the chest with mildly prominent bibasilar markings and stable mild cardiomegaly.  No effusion, edema, pneumothorax or new consolidation.  No acute osseous abnormality.  IMPRESSION: No acute cardiopulmonary disease.   Original Report Authenticated By: Jacqulynn Cadet, M.D.    Medications: I have reviewed the patient's current medications. Scheduled Meds: . albuterol  2.5 mg Nebulization Q6H  . aspirin EC  81 mg Oral Daily  . heparin  5,000 Units Subcutaneous Q8H  . hydrocortisone sodium succinate  50 mg Intravenous Q12H  . insulin aspart  0-15 Units Subcutaneous TID WC  . insulin aspart  0-5 Units Subcutaneous QHS  . nicotine  14 mg Transdermal Daily  . ondansetron  4 mg  Intravenous Once  . simvastatin  40 mg Oral q1800   Continuous Infusions: . sodium chloride 1,000 mL (05/15/12 1900)   PRN Meds:.sodium chloride, albuterol, ALPRAZolam, HYDROcodone-acetaminophen, ondansetron (ZOFRAN) IV  Assessment/Plan: Principal Problem:   Hypovolemic shock Active Problems:   DM   CHRONIC OBSTRUCTIVE PULMONARY DISEASE   Obstructive sleep apnea   Chronic pain syndrome   RA (rheumatoid arthritis)   Nausea vomiting and diarrhea  [name] is a 52 yo male with a history of RA on chronic immunosuppressive therapy with corticosteroids and Enbrel as well as COPD and morbid obesity who comes to [place] after a 1 day stay in the ICU after presenting in hypovolemic shock in the setting of diarrhea and profuse emesis.   1. Combined Shock - patient presented in septic shock meeting 4/4 SIRS criteria and with suspected source (most likely viral gastroenteritis - norovirus, rotavirus) on admission. She also had a component of hypovolemic shock given his GI losses and low urine output on admission. Given patient's history of chronic steroid use, he could also have a component of adrenal insufficiency which could have exacerbated his presentation. Overnight patient with improvement of symptoms after aggressive hydration and abx therapy, now normotensive and with resolution of lactic acidosis. Afebrile for >24 hrs and downtrending white count.  -s/p Vanc and Zosyn x1  -will d/c Cipro and Flagyl given that this is most likely of viral etiology and observe patient overnight off abx  -will begin hydrocortisone taper today to 85m q12h, 566mqd on 2/23, and continue with 1033md which is patient's baseline. -zofran PRN for nausea -pt advanced to consistent carbs overnight which he is tolerating well  2. Chest pain - present on admission, history is most consistent with arthritic pain, possibly exacerbated by multiple episodes of emesis and dry heaving. EKG unchanged and troponin normal x2.   -continue to cycle troponins x 2  -continue to monitor on telemetry  3. Viral gastroenteritis - most likely given presentation. Patient also with elevated WBC count on admission although there is a chronic component to this given chronic steroid therapy. Pt with sick contacts with similar symptoms and on chronic immunosuppressive therapy for his RA which makes him more susceptible. Influenza and C. diff panel negative. Afebrile >24 hours and downtrending white count. -s/p Vanc and Zosyn x1  -d/c Cipro and Flagyl as above -zofran PRN for nausea  -continue to advance diet as above  4. DM - on outpatient Metformin, also likely exacerbated by chronic steroid use.  -SSI with meals  -CBG q4h  -continue to hold Metformin   5.  COPD - patient with diffuse bilateral wheezes on exam, however no hx of URI, increase in cough or sputum production. No evidence of infectious process on chest xray. This could represent a mild exacerbation of his COPD vs viral illeness. Pt currently satting well on room air.  -s/p vanc and zosyn as above  -continue duonebs prn  -patient on stress dose of steroids which can also help with COPD exacerb  -CPAP qHS and O2 prn   6. AKI - downtrending to 0.95 today. Patient with acute increase of creatinine to 1.6 on admission from baseline of 0.7 on 06/2011. Most likely secondary to hypovolemic shock. Initially with poor UOP on admission but this has improved with IV hydration.  -continue IVF  -monitor UOP  -monitor BMET  -will need repeat U/A on f/u outpatient visit to monitor resolution of hemoglobinuria/proteinuria   7. Sleep apnea - on home CPAP. Satting well on room air.  -continue to CPAP qHS   8. HTN - on lisinopril. Held on admission 2/2 shock. BPs improved but on the low side.  - hold lisinopril   9. HLD -stable  -continue home simvastatin  -continue ASA   10. Chronic pain syndrome - history of L4-L5 laminectomy with persistent lumbar spinal canal narrowing  and chronic pain  -continue home Norco 5-38m 2 tabs q6h for pain prn  -holding Gabapentin pending resolution of acute issues   11. Chronically elevated alk phos - uncertain etiology; normal ultrasounds and MRCP in 2013. LFTs, T. Bili, coags, albumin, amylase, lipase wnls. Abdominal exam improved.  -continue to monitor  12. RA - chronic. On Embrel and Hydrocortisone -holding embrel in the setting of acute infection -steroid dosing as above   LOS: 2 days   This is a [employer] information officerNote.  The care of the patient was discussed with Dr. BOwens Sharkand the assessment and plan formulated with their assistance.  Please see their attached note for official documentation of the daily encounter.  YMike Gip2/22/2014, 9:48 AM

## 2012-05-16 NOTE — Progress Notes (Signed)
PT Cancellation Note  Patient Details Name: Andrew Fowler MRN: [id number] DOB: 1960-09-05   Cancelled Treatment:    Reason Eval/Treat Not Completed: Other (comment) (pt refused any intervention with PT.  Will sign off at this.) 05/16/2012  Donnella Sham, PT [phone] (pager)   [name], Tessie Fass 05/16/2012, 10:48 AM

## 2012-05-17 LAB — BASIC METABOLIC PANEL
Calcium: 8.5 mg/dL (ref 8.4–10.5)
Chloride: 105 mEq/L (ref 96–112)
Creatinine, Ser: 0.91 mg/dL (ref 0.50–1.35)
GFR calc Af Amer: >90 mL/min (ref >90)
Sodium: 139 mEq/L (ref 135–145)

## 2012-05-17 LAB — GLUCOSE, CAPILLARY: Glucose-Capillary: 128 mg/dL — ABNORMAL HIGH (ref 70–99)

## 2012-05-17 MED ORDER — SODIUM CHLORIDE 0.9 % IJ SOLN: 10.0000 mL | INTRAMUSCULAR | Status: DC | PRN

## 2012-05-17 MED ORDER — METFORMIN HCL 1000 MG PO TABS: 1000.0000 mg | ORAL_TABLET | Freq: Two times a day (BID) | ORAL | Status: DC

## 2012-05-17 MED ORDER — ALBUTEROL SULFATE (5 MG/ML) 0.5% IN NEBU
2.5000 mg | INHALATION_SOLUTION | Freq: Three times a day (TID) | RESPIRATORY_TRACT | Status: DC
  Administered 2012-05-17: 2.5 mg via RESPIRATORY_TRACT
  Filled 2012-05-17: qty 0.5

## 2012-05-17 MED ORDER — INFLUENZA VIRUS VACC SPLIT PF IM SUSP
0.5000 mL | INTRAMUSCULAR | Status: AC
  Administered 2012-05-17: 0.5 mL via INTRAMUSCULAR
  Filled 2012-05-17: qty 0.5

## 2012-05-17 NOTE — Discharge Summary (Signed)
[facility] Discharge Note  Name: Andrew Fowler MRN: [id number] DOB: 1960/07/14 52 y.o.  Date of Admission: 05/14/2012  9:39 PM Date of Discharge: 05/17/2012 Attending Physician: Dr. Scharlene Gloss  Discharge Diagnosis: 1. Hypovolemic shock - 2/2 gastroenteritis, resolved 2. Viral Gastroenteritis - resolved 3. Acute Kidney Injury - prerenal, resolved 4. Diabetes Mellitus type 2 - A1C = 8.7, increased dose of metformin 5. Hypertension 6. COPD 7. Rheumatoid Arthritis 8. Tobacco abuse 9. Obstructive Sleep Apnea 10. Hyperlipidemia  Discharge Medications:   Medication List    TAKE these medications       ALPRAZolam 0.5 MG tablet  Commonly known as:  XANAX  Take 1 tablet (0.5 mg total) by mouth 3 (three) times daily as needed. For anxiety     BAYER LOW STRENGTH 81 MG EC tablet  Generic drug:  aspirin  Take 81 mg by mouth daily.     etanercept 25 MG injection  Commonly known as:  ENBREL  Inject 2 mLs (50 mg total) into the skin once a week.     gabapentin 300 MG capsule  Commonly known as:  NEURONTIN  Take 1 capsule (300 mg total) by mouth 3 (three) times daily.     glucose blood test strip  Use as instructed     HYDROcodone-acetaminophen 5-325 MG per tablet  Commonly known as:  NORCO  Take 2 tablets by mouth every 6 (six) hours as needed for pain.     lisinopril 10 MG tablet  Commonly known as:  PRINIVIL  Take 1 tablet (10 mg total) by mouth daily.     metFORMIN 1000 MG tablet  Commonly known as:  GLUCOPHAGE  Take 1 tablet (1,000 mg total) by mouth 2 (two) times daily with a meal.     predniSONE 10 MG tablet  Commonly known as:  DELTASONE  Take 10 mg by mouth daily.     simvastatin 40 MG tablet  Commonly known as:  ZOCOR  Take 1 tablet (40 mg total) by mouth at bedtime.     VENTOLIN HFA 108 (90 BASE) MCG/ACT inhaler  Generic drug:  albuterol  Inhale 1-2 puffs into the lungs every 4 (four) hours as needed. shortness of breath         Disposition and follow-up:   [name] was discharged from [facility] in stable and improved condition, with resolution of shock and viral gastroenteritis.  At hospital follow-up, please address the following: 1. Check BMET to ensure full resolution of AKI 2. Address tobacco cessation, consider referral to [facility] 3. Assess for GI side effects of increased dose of metformin  Follow-up Appointments:     Discharge Orders   Future Appointments Provider Department Dept Phone   05/22/2012 2:45 PM Blain Pais, MD [place] [phone]   Future Orders Complete By Expires     Call MD for:  persistant nausea and vomiting  As directed     Call MD for:  temperature >100.4  As directed     Diet Carb Modified  As directed     Discharge instructions  As directed     Comments:      You were hospitalized with Viral Gastroenteritis, also called "stomach flu".  The virus that caused this infection may still be present in your household, since your family members are still sick.  To avoid becoming infected again, wash your hands frequently throughout the day and before eating.  If you  again develop symptoms of nausea, vomiting, or diarrhea, be sure to drink plenty of fluids, and call our clinic.    Increase activity slowly  As directed        Consultations: PCCM ([facility])  Procedures Performed:  Dg Chest Portable 1 View  05/15/2012  *RADIOLOGY REPORT*  Clinical Data: Central line placement  PORTABLE CHEST - 1 VIEW  Comparison: 05/14/2012  Findings: Interval right IJ catheter placed with tip projecting over the mid SVC.  Hypoaeration with interstitial and vascular crowding and mild bibasilar opacity. Allowing for this, mild interstitial prominence.  Prominent cardiomediastinal contours.  No pleural effusion or pneumothorax.  No acute osseous finding.  IMPRESSION: Right IJ catheter placement with tip projecting over the mid SVC. No  pneumothorax.  Prominent cardiomediastinal contours, similar to prior.  Hypoaeration with mild bibasilar opacity, favor atelectasis.  Interstitial prominence may be accentuated by hypoaeration; can also be seen with atypical/viral infection or edema.   Original Report Authenticated By: Carlos Levering, M.D.    Dg Chest Port 1 View  05/14/2012  *RADIOLOGY REPORT*  Clinical Data: Cough  PORTABLE CHEST - 1 VIEW  Comparison: Prior chest x-ray 06/13/2011  Findings: Unchanged appearance of the chest with mildly prominent bibasilar markings and stable mild cardiomegaly.  No effusion, edema, pneumothorax or new consolidation.  No acute osseous abnormality.  IMPRESSION: No acute cardiopulmonary disease.   Original Report Authenticated By: Jacqulynn Cadet, M.D.     Admission HPI:  The patient is a 52 yo M, history of RA (on chronic prednisone, enbrel), presenting with a 1-day history of nausea, vomiting, diarrhea, and abdominal pain. The patient notes 9 episodes of nbnb vomiting since 4pm on the day prior to admission. Patient currently denies diarrhea, but reportedly noted it on admission overnight. The patient notes 2 family members with similar symptoms, but no new/suspicious foods, recent travel, or contact with the healthcare system. The patient also notes left shoulder/chest pain, similar to his chronic left should osteoarthritis on the day prior to admission, which has now resolved (troponin neg x1, EKG normal).   Admission Physical Exam Blood pressure 102/63, pulse 111, temperature 97.1 F (36.2 C), temperature source Oral, resp. rate 23, height 5' 10.08" (1.78 m), weight 357 lb 9.4 oz (162.2 kg), SpO2 97.00%.  General: alert, cooperative, and in no apparent distress HEENT: pupils equal round and reactive to light, vision grossly intact, oropharynx clear and non-erythematous  Neck: supple, no lymphadenopathy Lungs: normal work of respiration, few inspiratory and expiratory wheezes heard  bilaterally Heart: distant heart sounds likely secondary to body habitus, tachycardic, regular rhythm, no murmurs, gallops, or rubs Abdomen: soft, mildly tender to deep palpation diffusely, non-distended, normal/hyperactive bowel sounds, no guarding or rebound tenderness  Extremities: bilateral 1+ pitting edema Neurologic: alert & oriented X3, cranial nerves II-XII intact, strength grossly intact, sensation intact to light touch  Admission Labs Basic Metabolic Panel:   45/40/98 2205  05/15/12 0304   NA  137  138   K  4.1  4.4   CL  96  104   CO2  27  24   GLUCOSE  161*  155*   BUN  19  24*   CREATININE  1.60*  1.73*   CALCIUM  9.5  7.4*    Liver Function Tests:   05/14/12 2205   AST  36   ALT  68*   ALKPHOS  202*   BILITOT  0.7   PROT  8.0   ALBUMIN  3.7  05/15/12 0302   LIPASE  21   AMYLASE  33    CBC:   05/14/12 2205  05/15/12 0304   WBC  16.8*  15.9*   NEUTROABS  15.3*  --   HGB  18.0*  14.0   HCT  53.5*  43.6   MCV  94.5  94.2   PLT  201  175     Hospital Course by problem list: 1. Hypovolemic shock - The patient presented with nausea, vomiting, and diarrhea x1 day, and was found to be hypotensive to 58/23 and tachycardic to 124, consistent with hypovolemic shock.  The patient was admitted to the ICU and aggressively rehydrated, with prompt resolution of shock.  At the time of admission, the patient was febrile to 102.4, so was treated for potential septic shock with Vanc and Zosyn, later changed to cipro and flagyl, as well as stress-dose solu-cortef.  The patient was stable for transfer out of the ICU within 24 hours.  Solu-cortef was tapered over the course of 3 days back to the patient's home prednisone dose of 10 mg/day (to start on the day after discharge).  Antibiotics were discontinued after 2 days, given negative UA, urine culture, blood cultures, and CXR.  2. Viral Gastroenteritis - the patient's presenting symptoms (nausea, vomiting, diarrhea) and  the presence of 2 contacts in the home with similar symptoms was consistent with viral gastroenteritis.  By day 2 of hospitalization, the patient's symptoms had resolved, and did not recur.  The patient was instructed on the importance of hand-washing to prevent illness, and drinking fluids to prevent dehydration.  3. Acute Kidney Injury - The patient presented with an elevation in creatinine from 0.7 (last normal) to 1.7, which improved to 0.9 at the time of discharge with IV fluids.  The etiology was likely prerenal from volume depletion, but may have contained a component of ATN from hypotension.  4. Diabetes Mellitus type 2 - The patient has a history of diabetes, managed with metformin at home, with an A1C = 6.7 three months prior to admission.  A repeat A1C was checked during admission, and was found to be 8.7.  CBG's during this admission were elevated to the 200-300's, likely worsened by the high-dose steroids given for shock.  The patient is currently on metformin 500 mg BID.  He was instructed to increase his metformin to 1000 mg BID once he has fully recovered from viral gastroenteritis (in a few days).  Patient was cautioned about GI side effects of metformin.  5. Hypertension - The patient has a history of HTN.  Lisinopril was held during this hospitalization due to AKI and initial hypotension, but will be resumed at discharge.  6. Rheumatoid Arthritis - The patient has a history of RA, on home prednisone 10 daily and enbrel.  The patient was given stress-dose solu-cortef during this hospitalization, but was tapered back to his home dose of prednisone at discharge.  7. Tobacco abuse - The patient is a current smoker, but states he would like to quit.  He has tried the patches in the past without success.  This issue will be addressed in the outpatient setting.  Time spent on discharge: 45 minutes  Discharge Vitals:  BP 128/83  Pulse 87  Temp(Src) 97.8 F (36.6 C) (Oral)  Resp 18  Ht  5' 10.08" (1.78 m)  Wt 363 lb 15.7 oz (165.1 kg)  BMI 52.11 kg/m2  SpO2 97%  Discharge Labs:  Results for orders placed during the  hospital encounter of 05/14/12 (from the past 24 hour(s))  GLUCOSE, CAPILLARY     Status: Abnormal   Collection Time    05/16/12 11:34 AM      Result Value Range   Glucose-Capillary 300 (*) 70 - 99 mg/dL  GLUCOSE, CAPILLARY     Status: Abnormal   Collection Time    05/16/12 12:50 PM      Result Value Range   Glucose-Capillary 243 (*) 70 - 99 mg/dL   Comment 1 Documented in Chart     Comment 2 Notify RN    GLUCOSE, CAPILLARY     Status: Abnormal   Collection Time    05/16/12  4:25 PM      Result Value Range   Glucose-Capillary 238 (*) 70 - 99 mg/dL   Comment 1 Documented in Chart     Comment 2 Notify RN    GLUCOSE, CAPILLARY     Status: Abnormal   Collection Time    05/16/12  9:31 PM      Result Value Range   Glucose-Capillary 232 (*) 70 - 99 mg/dL  BASIC METABOLIC PANEL     Status: Abnormal   Collection Time    05/17/12  5:33 AM      Result Value Range   Sodium 139  135 - 145 mEq/L   Potassium 3.5  3.5 - 5.1 mEq/L   Chloride 105  96 - 112 mEq/L   CO2 28  19 - 32 mEq/L   Glucose, Bld 145 (*) 70 - 99 mg/dL   BUN 15  6 - 23 mg/dL   Creatinine, Ser 0.91  0.50 - 1.35 mg/dL   Calcium 8.5  8.4 - 10.5 mg/dL   GFR calc non Af Amer >90  >90 mL/min   GFR calc Af Amer >90  >90 mL/min  GLUCOSE, CAPILLARY     Status: Abnormal   Collection Time    05/17/12  7:45 AM      Result Value Range   Glucose-Capillary 128 (*) 70 - 99 mg/dL    Signed: Elnora Morrison 05/17/2012, 8:56 AM

## 2012-05-17 NOTE — Progress Notes (Signed)
Subjective: No acute events overnight.  The patient notes no further nausea, vomiting, abdominal pain, or diarrhea.  He notes that he spoke with his brother, who now has similar symptoms of vomiting and diarrhea, further supporting a diagnosis of viral gastroenteritis.  The patient has been afebrile off antibiotics.  Objective: Vital signs in last 24 hours: Filed Vitals:   05/16/12 1938 05/16/12 2136 05/17/12 0513 05/17/12 0808  BP:  121/84 128/83   Pulse: 96 94 82 87  Temp:  98.4 F (36.9 C) 97.8 F (36.6 C)   TempSrc:  Oral Oral   Resp: 18 20 18 18   Height:      Weight:  363 lb 15.7 oz (165.1 kg)    SpO2:  96% 98% 97%   Weight change: 1 lb 8.7 oz (0.7 kg)  Intake/Output Summary (Last 24 hours) at 05/17/12 0838 Last data filed at 05/17/12 0516  Gross per 24 hour  Intake    730 ml  Output   1560 ml  Net   -830 ml   General: alert, cooperative, and in no apparent distress HEENT: pupils equal round and reactive to light, vision grossly intact, oropharynx clear and non-erythematous  Neck: supple, no lymphadenopathy Lungs: normal work of respiration, few inspiratory and expiratory wheezes heard bilaterally Heart: distant heart sounds likely secondary to body habitus, tachycardic, regular rhythm, no murmurs, gallops, or rubs Abdomen: soft, mildly tender to deep palpation diffusely, non-distended, normal/hyperactive bowel sounds, no guarding or rebound tenderness  Extremities: trace non-pitting edema Neurologic: alert & oriented X3, cranial nerves II-XII intact, strength grossly intact, sensation intact to light touch  Lab Results: Basic Metabolic Panel:  Recent Labs Lab 05/16/12 0448 05/17/12 0533  NA 139 139  K 3.6 3.5  CL 104 105  CO2 23 28  GLUCOSE 337* 145*  BUN 16 15  CREATININE 0.95 0.91  CALCIUM 8.3* 8.5  MG 1.9  --    Liver Function Tests:  Recent Labs Lab 05/14/12 2205 05/16/12 0448  AST 36 34  ALT 68* 56*  ALKPHOS 202* 155*  BILITOT 0.7 0.2*  PROT  8.0 5.8*  ALBUMIN 3.7 2.7*    Recent Labs Lab 05/15/12 0302  LIPASE 21  AMYLASE 33   CBC:  Recent Labs Lab 05/14/12 2205 05/15/12 0304 05/16/12 0448  WBC 16.8* 15.9* 10.8*  NEUTROABS 15.3*  --   --   HGB 18.0* 14.0 13.3  HCT 53.5* 43.6 40.9  MCV 94.5 94.2 94.5  PLT 201 175 195   Cardiac Enzymes:  Recent Labs Lab 05/15/12 0303 05/15/12 2000  TROPONINI <0.30 <0.30   CBG:  Recent Labs Lab 05/16/12 0734 05/16/12 1134 05/16/12 1250 05/16/12 1625 05/16/12 2131 05/17/12 0745  GLUCAP 272* 300* 243* 238* 232* 128*   Hemoglobin A1C:  Recent Labs Lab 05/16/12 0448  HGBA1C 8.7*   Coagulation:  Recent Labs Lab 05/14/12 2205 05/15/12 0302  LABPROT 12.3 13.6  INR 0.92 1.05   Urine Drug Screen: Drugs of Abuse     Component Value Date/Time   LABOPIA POS* 04/23/2011 1113   LABOPIA NONE DETECTED 06/25/2010 0022   COCAINSCRNUR NEG 04/23/2011 1113   COCAINSCRNUR NONE DETECTED 06/25/2010 0022   LABBENZ POS* 04/23/2011 1113   LABBENZ NONE DETECTED 06/25/2010 0022   AMPHETMU NONE DETECTED 06/25/2010 0022   THCU NONE DETECTED 06/25/2010 0022   LABBARB NEG 04/23/2011 1113   LABBARB  Value: NONE DETECTED        DRUG SCREEN FOR MEDICAL PURPOSES ONLY.  IF CONFIRMATION IS  NEEDED FOR ANY PURPOSE, NOTIFY LAB WITHIN 5 DAYS.        LOWEST DETECTABLE LIMITS FOR URINE DRUG SCREEN Drug Class       Cutoff (ng/mL) Amphetamine      1000 Barbiturate      200 Benzodiazepine   740 Tricyclics       814 Opiates          300 Cocaine          300 THC              50 06/25/2010 0022    Urinalysis:  Recent Labs Lab 05/15/12 0135  COLORURINE YELLOW  LABSPEC 1.014  PHURINE 5.0  GLUCOSEU NEGATIVE  HGBUR LARGE*  BILIRUBINUR NEGATIVE  KETONESUR NEGATIVE  PROTEINUR 100*  UROBILINOGEN 0.2  NITRITE NEGATIVE  LEUKOCYTESUR NEGATIVE    Micro Results: Recent Results (from the past 240 hour(s))  CULTURE, BLOOD (ROUTINE X 2)     Status: None   Collection Time    05/14/12 10:15 PM      Result Value  Range Status   Specimen Description BLOOD ARM RIGHT   Final   Special Requests BOTTLES DRAWN AEROBIC ONLY 10CC   Final   Culture  Setup Time 05/15/2012 08:44   Final   Culture     Final   Value:        BLOOD CULTURE RECEIVED NO GROWTH TO DATE CULTURE WILL BE HELD FOR 5 DAYS BEFORE ISSUING A FINAL NEGATIVE REPORT   Report Status PENDING   Incomplete  CULTURE, BLOOD (ROUTINE X 2)     Status: None   Collection Time    05/14/12 10:40 PM      Result Value Range Status   Specimen Description BLOOD HAND RIGHT   Final   Special Requests BOTTLES DRAWN AEROBIC ONLY 3CC   Final   Culture  Setup Time 05/15/2012 08:44   Final   Culture     Final   Value:        BLOOD CULTURE RECEIVED NO GROWTH TO DATE CULTURE WILL BE HELD FOR 5 DAYS BEFORE ISSUING A FINAL NEGATIVE REPORT   Report Status PENDING   Incomplete  URINE CULTURE     Status: None   Collection Time    05/15/12  1:35 AM      Result Value Range Status   Specimen Description URINE, CLEAN CATCH   Final   Special Requests NONE   Final   Culture  Setup Time 05/15/2012 08:42   Final   Colony Count NO GROWTH   Final   Culture NO GROWTH   Final   Report Status 05/16/2012 FINAL   Final  MRSA PCR SCREENING     Status: None   Collection Time    05/15/12  5:05 AM      Result Value Range Status   MRSA by PCR NEGATIVE  NEGATIVE Final   Comment:            The GeneXpert MRSA Assay (FDA     approved for NASAL specimens     only), is one component of a     comprehensive MRSA colonization     surveillance program. It is not     intended to diagnose MRSA     infection nor to guide or     monitor treatment for     MRSA infections.  CLOSTRIDIUM DIFFICILE BY PCR     Status: None   Collection Time    05/15/12  7:26 AM      Result Value Range Status   C difficile by pcr NEGATIVE  NEGATIVE Final   Studies/Results: No results found. Medications: I have reviewed the patient's current medications. Scheduled Meds: . albuterol  2.5 mg Nebulization  TID  . aspirin EC  81 mg Oral Daily  . heparin  5,000 Units Subcutaneous Q8H  . influenza  inactive virus vaccine  0.5 mL Intramuscular Tomorrow-1000  . insulin aspart  0-20 Units Subcutaneous TID WC  . insulin aspart  0-5 Units Subcutaneous QHS  . nicotine  14 mg Transdermal Daily  . ondansetron  4 mg Intravenous Once  . simvastatin  40 mg Oral q1800   Continuous Infusions:  PRN Meds:.sodium chloride, albuterol, ALPRAZolam, HYDROcodone-acetaminophen, ondansetron (ZOFRAN) IV, sodium chloride Assessment/Plan: The patient is a 52 yo man, history of COPD, DM2, presenting with viral gastroenteritis with hypovolemic shock.   # Gastroenteritis - patient presents with nausea, vomiting, and diarrhea x1 day, with 2 family members at home with similar symptoms, consistent with viral gastroenteritis. Patient more susceptible to infection given chronic prednisone and enbrel (for RA). Influenza negative, c diff negative. Symptoms appear to have resolved.  -norovirus panel pending -continue regular diet, patient tolerating  # Shock - Resolved. likely hypovolemic in the setting of likely acute viral gastroenteritis (see above). Septic shock initially considered, but unlikely (UA with few RBC's but neg urine culture, CXR with no evidence of PNA). Now improved, with improvement in mental status and increase in CVP. Lactic acid initially elevated, but now normalized.  -continue to taper down steroids - solu-cortef 50 today, then resume home dose tomorrow -blood cultures NGTD -urine culture negative  # AKI - Resolved. Creatinine 0.7 -> 1.7 -> 0.91. Differential includes pre-renal vs ATN (from shock/hypotension).   # HTN - currently normotensive  -holding lisinopril, will resume at discharge  # DM2 - on home metformin, last A1C = 6.7 (01/24/12). CBG's now in 200-300's.  Patient required 34 units of insulin yesterday total -moderate SSI with qhs coverage   # COPD - chronic, stable. Few wheezes on lung  exam.  -duonebs prn   # OSA - chronic, stable  -CPAP qhs   # Rheumatoid arthritis - on chronic prednisone 10 mg daily and Enbrel weekly  -stress dose steroids, will wean down to home dose  -hold enbrel for now   # Hyperlipidemia - chronic, stable  -simvastatin   # Prophy - heparin   Dispo: Discharge home today  The patient does have a current PCP Larey Dresser, MD), therefore will be requiring OPC follow-up after discharge.   The patient does have transportation limitations that hinder transportation to clinic appointments.  Patient refused to participate with PT, so outpatient PT/OT needs are unclear at this time.    LOS: 3 days   Elnora Morrison 05/17/2012, 8:38 AM

## 2012-05-17 NOTE — Progress Notes (Signed)
Received text of missed beat. Pt is alert and oriented. Denies chest pain, shortness of breath. Will continue to monitor.

## 2012-05-18 NOTE — Discharge Summary (Signed)
Patient with gastroenteritis c/w viral etiology with sick contacts.  No positive cultures.  Responded to supportive management and stable at discharge.

## 2012-05-21 LAB — CULTURE, BLOOD (ROUTINE X 2): Culture: NO GROWTH

## 2012-05-22 ENCOUNTER — Encounter: Payer: Self-pay | Admitting: Internal Medicine

## 2012-05-22 ENCOUNTER — Ambulatory Visit (INDEPENDENT_AMBULATORY_CARE_PROVIDER_SITE_OTHER): Payer: Medicaid Other | Admitting: Internal Medicine

## 2012-05-22 VITALS — BP 160/103 | HR 80 | Temp 97.3°F | Ht 71.0 in | Wt 356.9 lb

## 2012-05-22 DIAGNOSIS — R809 Proteinuria, unspecified: Secondary | ICD-10-CM

## 2012-05-22 DIAGNOSIS — G894 Chronic pain syndrome: Secondary | ICD-10-CM

## 2012-05-22 DIAGNOSIS — F411 Generalized anxiety disorder: Secondary | ICD-10-CM

## 2012-05-22 DIAGNOSIS — I10 Essential (primary) hypertension: Secondary | ICD-10-CM

## 2012-05-22 DIAGNOSIS — F17200 Nicotine dependence, unspecified, uncomplicated: Secondary | ICD-10-CM

## 2012-05-22 DIAGNOSIS — R6889 Other general symptoms and signs: Secondary | ICD-10-CM

## 2012-05-22 DIAGNOSIS — E119 Type 2 diabetes mellitus without complications: Secondary | ICD-10-CM

## 2012-05-22 DIAGNOSIS — J4489 Other specified chronic obstructive pulmonary disease: Secondary | ICD-10-CM

## 2012-05-22 DIAGNOSIS — J449 Chronic obstructive pulmonary disease, unspecified: Secondary | ICD-10-CM

## 2012-05-22 DIAGNOSIS — R609 Edema, unspecified: Secondary | ICD-10-CM

## 2012-05-22 DIAGNOSIS — M069 Rheumatoid arthritis, unspecified: Secondary | ICD-10-CM

## 2012-05-22 LAB — BASIC METABOLIC PANEL WITH GFR
BUN: 14 mg/dL (ref 6–23)
Calcium: 9.2 mg/dL (ref 8.4–10.5)
Chloride: 102 mEq/L (ref 96–112)
Creat: 1.02 mg/dL (ref 0.50–1.35)
GFR, Est African American: >89 mL/min
GFR, Est Non African American: 85 mL/min

## 2012-05-22 MED ORDER — HYDROCHLOROTHIAZIDE 25 MG PO TABS
25.0000 mg | ORAL_TABLET | Freq: Every day | ORAL | Status: DC
Start: 2012-05-22 — End: 2012-08-18

## 2012-05-22 MED ORDER — HYDROCODONE-ACETAMINOPHEN 10-325 MG PO TABS: 1.0000 | ORAL_TABLET | Freq: Four times a day (QID) | ORAL | Status: DC | PRN

## 2012-05-22 NOTE — Patient Instructions (Addendum)
General Instructions: -Start taking hydrochlorothiazide for your blood pressure. Take this in the morning.  -Return to the clinic in one week for a lab visit -Follow up with a doctor visit in the clinic in two weeks.  -It was great meeting you today!   Treatment Goals:  Goals (1 Years of Data) as of 05/22/12         As of Today 05/17/12 05/16/12 05/16/12 05/16/12     Blood Pressure    . Blood Pressure < 140/90  173/109 128/83 121/84 136/78 120/83     Lifestyle    . Quit smoking / using tobacco           Result Component    . HEMOGLOBIN A1C < 7.0    8.7      . LDL CALC < 100            Progress Toward Treatment Goals:  Treatment Goal 05/22/2012  Hemoglobin A1C deteriorated  Blood pressure deteriorated  Stop smoking smoking the same amount    Self Care Goals & Plans:  Self Care Goal 05/22/2012  Manage my medications bring my medications to every visit; take my medicines as prescribed  Monitor my health keep track of my blood glucose; bring my glucose meter and log to each visit  Eat healthy foods eat foods that are low in salt; eat baked foods instead of fried foods       Care Management & Community Referrals:  Referral 05/22/2012  Referrals made for care management support none needed

## 2012-05-23 DIAGNOSIS — R809 Proteinuria, unspecified: Secondary | ICD-10-CM | POA: Insufficient documentation

## 2012-05-23 LAB — MICROALBUMIN / CREATININE URINE RATIO
Microalb Creat Ratio: 6.3 mg/g (ref 0.0–30.0)
Microalb, Ur: 0.84 mg/dL (ref 0.00–1.89)

## 2012-05-23 LAB — URINALYSIS, MICROSCOPIC ONLY
Bacteria, UA: NONE SEEN
Crystals: NONE SEEN
Squamous Epithelial / LPF: NONE SEEN

## 2012-05-23 LAB — URINALYSIS, ROUTINE W REFLEX MICROSCOPIC
Bilirubin Urine: NEGATIVE
Glucose, UA: NEGATIVE mg/dL
Ketones, ur: NEGATIVE mg/dL
Leukocytes, UA: NEGATIVE
Nitrite: NEGATIVE
Protein, ur: NEGATIVE mg/dL
pH: 6 (ref 5.0–8.0)

## 2012-05-23 NOTE — Assessment & Plan Note (Signed)
Lab Results  Component Value Date   HGBA1C 8.7* 05/16/2012   HGBA1C 6.7 01/24/2012   CREATININE 1.02 05/22/2012   CREATININE 0.91 05/17/2012   MICROALBUR 0.84 05/22/2012   MICRALBCREAT 6.3 05/22/2012   CHOL 172 07/01/2011   HDL 88 07/01/2011   TRIG 112 07/01/2011    Last eye exam and foot exam:    Component Value Date/Time   HMDIABFOOTEX done 01/31/2010    Assessment: Diabetes control: not controlled Progress toward goals: deteriorated Barriers to meeting goals: nonadherence to medications  Plan: Diabetes treatment: continue current medications metformin was just recently increased to 1062m BID Refer to: none Instruction/counseling given: reminded to bring blood glucose meter & log to each visit, reminded to bring medications to each visit and discussed foot care

## 2012-05-23 NOTE — Assessment & Plan Note (Signed)
Unclear if this is a true dx for him as he never had PFTs.  -Ordered PFTs.

## 2012-05-23 NOTE — Assessment & Plan Note (Addendum)
BP elevated today, could be 2/2 recent fluid resuscitation effects. He likely needs a diuretic but Lasix is not the best option given his history of gout.  -Started HCTZ 12.51m with lab visit for next week and reassessment in two weeks. This may not be necessary as a long term medicaiton -BMET during this visit with nl values

## 2012-05-23 NOTE — Assessment & Plan Note (Signed)
Reordered electrophoresis with IgG

## 2012-05-23 NOTE — Assessment & Plan Note (Signed)
He is trying to quit smoking. He has tried e-cigarettes and  nicotine patches. He wants to try nicotine gum.  -Gave him brochure on smoking cessation information, referred him to 1-800-QUIT-NOW

## 2012-05-23 NOTE — Assessment & Plan Note (Signed)
He is followed by Dr. Luan Pulling and will eventually be weaned off prednisone.

## 2012-05-23 NOTE — Progress Notes (Signed)
  Subjective:    Patient ID: Andrew Fowler, male    DOB: April 06, 1960, 52 y.o.   MRN: [id number]  HPI Andrew Fowler is a 52 year old man with PMH of DM2, Hyperlipidemia, HTN, obesity, RA, chronic pain syndrome, COPD,  and current tobacco abuse who comes in for hospital follow up visit. He was discharged on 2/23 after fluid resuscitation for hypovolemic shock secondary to viral gastroenteritis. He states that since leaving the hospital his stools are still soft but he denies diarrhea or decreased intake per mouth. His family members were sick with viral gastroenteritis while he was in the hospital but they all have recovered now with no recent acute illnesses in the family.  His BP is elevated to 173/109 today and he reports increased shortness of breath. He would like refill on his pain medication for his chronic pain syndrome.    Review of Systems  Constitutional: Negative for fever, chills, diaphoresis, activity change, appetite change, fatigue and unexpected weight change.  Respiratory: Positive for shortness of breath. Negative for cough, chest tightness, wheezing and stridor.   Cardiovascular: Positive for leg swelling. Negative for chest pain and palpitations.  Gastrointestinal: Negative for nausea, vomiting, abdominal pain, diarrhea, constipation and abdominal distention.  Genitourinary: Negative for dysuria and frequency.  Musculoskeletal: Positive for back pain and arthralgias. Negative for myalgias.       Worsening of chronic right hip pain  Skin: Negative for color change, pallor, rash and wound.  Neurological: Negative for dizziness, syncope, weakness, light-headedness and headaches.  Hematological: Negative for adenopathy. Does not bruise/bleed easily.  Psychiatric/Behavioral: Negative for behavioral problems and agitation.       Objective:   Physical Exam  Nursing note and vitals reviewed. Constitutional: He is oriented to person, place, and time. No distress.  Morbidly obese   Eyes: Conjunctivae are normal. No scleral icterus.  Neck: No JVD present.  Cardiovascular: Normal rate and regular rhythm.   Pulmonary/Chest: Effort normal. No respiratory distress. He has no wheezes. He has no rales. He exhibits no tenderness.  Bibasilar crackles  Musculoskeletal: He exhibits edema.  1+ pitting edema bilaterally up to his mid shins  Neurological: He is alert and oriented to person, place, and time.  Skin: Skin is warm and dry. No rash noted. He is not diaphoretic. No erythema. No pallor.  Psychiatric: He has a normal mood and affect. His behavior is normal.          Assessment & Plan:

## 2012-05-23 NOTE — Assessment & Plan Note (Signed)
He reports being s/p left hip pain replacement and needing right hip pain replacement soon. He will make an appointment with Dr. Sharol Given soon.  -Changed pain medication to Norco 78m-325mg q6hr PRN for pain #90, 2 refills. Called in to cancel Norco [phone].

## 2012-05-23 NOTE — Assessment & Plan Note (Signed)
1+pitting edema today, acute changes after fluid resuscitation and hospital discharge. He likely will need a diuretic, started HCTZ which may not be a long-term medication for him.

## 2012-05-23 NOTE — Assessment & Plan Note (Signed)
He has one more refill of Xanax already ordered.

## 2012-05-23 NOTE — Assessment & Plan Note (Signed)
UA today with no proteinuria

## 2012-05-26 LAB — PROTEIN ELECTROPH W RFLX QUANT IMMUNOGLOBULINS
Alpha-1-Globulin: 7.1 % — ABNORMAL HIGH (ref 2.9–4.9)
Alpha-2-Globulin: 15.3 % — ABNORMAL HIGH (ref 7.1–11.8)
Beta 2: 5.3 % (ref 3.2–6.5)
Beta Globulin: 8.3 % — ABNORMAL HIGH (ref 4.7–7.2)
Gamma Globulin: 13.4 % (ref 11.1–18.8)

## 2012-05-26 LAB — IGG, IGA, IGM
IgA: 303 mg/dL (ref 68–379)
IgG (Immunoglobin G), Serum: 885 mg/dL (ref 650–1600)
IgM, Serum: 97 mg/dL (ref 41–251)

## 2012-05-26 LAB — IMMUNOFIXATION ADD-ON

## 2012-05-28 ENCOUNTER — Telehealth: Payer: Self-pay | Admitting: *Deleted

## 2012-05-28 NOTE — Telephone Encounter (Signed)
thanks

## 2012-05-28 NOTE — Telephone Encounter (Signed)
Pt called and cancelled appointment for tomorrow for f/u lab work.  He is concerned with the weather. I offered appointment today but he can't get transportation that soon. He will come in on Monday, 3/10 for labs.  This is a f/u from  2/28 visit.

## 2012-06-01 ENCOUNTER — Other Ambulatory Visit (INDEPENDENT_AMBULATORY_CARE_PROVIDER_SITE_OTHER): Payer: Medicaid Other

## 2012-06-02 LAB — BASIC METABOLIC PANEL WITH GFR
BUN: 16 mg/dL (ref 6–23)
Calcium: 9.6 mg/dL (ref 8.4–10.5)
Chloride: 92 mEq/L — ABNORMAL LOW (ref 96–112)
Creat: 0.96 mg/dL (ref 0.50–1.35)
Glucose, Bld: 149 mg/dL — ABNORMAL HIGH (ref 70–99)
Potassium: 4.4 mEq/L (ref 3.5–5.3)

## 2012-06-03 ENCOUNTER — Encounter: Payer: Self-pay | Admitting: Internal Medicine

## 2012-06-04 ENCOUNTER — Telehealth: Payer: Self-pay | Admitting: *Deleted

## 2012-06-04 NOTE — Telephone Encounter (Signed)
Pt called for lab results from Monday. Pt # [id number]

## 2012-06-04 NOTE — Telephone Encounter (Signed)
Called pt. Gave results. Sent message front desk to sch F/U appt

## 2012-06-17 ENCOUNTER — Ambulatory Visit: Payer: Medicaid Other | Admitting: Internal Medicine

## 2012-06-23 ENCOUNTER — Other Ambulatory Visit: Payer: Self-pay | Admitting: Internal Medicine

## 2012-06-24 ENCOUNTER — Ambulatory Visit (INDEPENDENT_AMBULATORY_CARE_PROVIDER_SITE_OTHER): Payer: Medicaid Other | Admitting: Radiation Oncology

## 2012-06-24 ENCOUNTER — Encounter: Payer: Self-pay | Admitting: Radiation Oncology

## 2012-06-24 VITALS — BP 113/79 | HR 95 | Temp 97.2°F | Ht 71.0 in | Wt 341.3 lb

## 2012-06-24 DIAGNOSIS — I10 Essential (primary) hypertension: Secondary | ICD-10-CM

## 2012-06-24 DIAGNOSIS — M069 Rheumatoid arthritis, unspecified: Secondary | ICD-10-CM

## 2012-06-24 DIAGNOSIS — J449 Chronic obstructive pulmonary disease, unspecified: Secondary | ICD-10-CM

## 2012-06-24 DIAGNOSIS — F17200 Nicotine dependence, unspecified, uncomplicated: Secondary | ICD-10-CM

## 2012-06-24 DIAGNOSIS — F418 Other specified anxiety disorders: Secondary | ICD-10-CM

## 2012-06-24 DIAGNOSIS — E119 Type 2 diabetes mellitus without complications: Secondary | ICD-10-CM

## 2012-06-24 DIAGNOSIS — F341 Dysthymic disorder: Secondary | ICD-10-CM

## 2012-06-24 MED ORDER — NICOTINE 21 MG/24HR TD PT24: 1.0000 | MEDICATED_PATCH | TRANSDERMAL | Status: DC

## 2012-06-24 MED ORDER — ALPRAZOLAM 0.5 MG PO TABS: 0.5000 mg | ORAL_TABLET | Freq: Three times a day (TID) | ORAL | Status: DC | PRN

## 2012-06-24 NOTE — Patient Instructions (Signed)
Please continue taking metformin as prescribed as your diabetes is very poorly controlled.   Strongly consider quitting smoking cigarettes, it is by far the most negative thing you are doing for your health and it can shorten your life substantially.   Smoking Cessation Quitting smoking is important to your health and has many advantages. However, it is not always easy to quit since nicotine is a very addictive drug. Often times, people try 3 times or more before being able to quit. This document explains the best ways for you to prepare to quit smoking. Quitting takes hard work and a lot of effort, but you can do it. ADVANTAGES OF QUITTING SMOKING  You will live longer, feel better, and live better.  Your body will feel the impact of quitting smoking almost immediately.  Within 20 minutes, blood pressure decreases. Your pulse returns to its normal level.  After 8 hours, carbon monoxide levels in the blood return to normal. Your oxygen level increases.  After 24 hours, the chance of having a heart attack starts to decrease. Your breath, hair, and body stop smelling like smoke.  After 48 hours, damaged nerve endings begin to recover. Your sense of taste and smell improve.  After 72 hours, the body is virtually free of nicotine. Your bronchial tubes relax and breathing becomes easier.  After 2 to 12 weeks, lungs can hold more air. Exercise becomes easier and circulation improves.  The risk of having a heart attack, stroke, cancer, or lung disease is greatly reduced.  After 1 year, the risk of coronary heart disease is cut in half.  After 5 years, the risk of stroke falls to the same as a nonsmoker.  After 10 years, the risk of lung cancer is cut in half and the risk of other cancers decreases significantly.  After 15 years, the risk of coronary heart disease drops, usually to the level of a nonsmoker.  If you are pregnant, quitting smoking will improve your chances of having a healthy  baby.  The people you live with, especially any children, will be healthier.  You will have extra money to spend on things other than cigarettes. QUESTIONS TO THINK ABOUT BEFORE ATTEMPTING TO QUIT You may want to talk about your answers with your caregiver.  Why do you want to quit?  If you tried to quit in the past, what helped and what did not?  What will be the most difficult situations for you after you quit? How will you plan to handle them?  Who can help you through the tough times? Your family? Friends? A caregiver?  What pleasures do you get from smoking? What ways can you still get pleasure if you quit? Here are some questions to ask your caregiver:  How can you help me to be successful at quitting?  What medicine do you think would be best for me and how should I take it?  What should I do if I need more help?  What is smoking withdrawal like? How can I get information on withdrawal? GET READY  Set a quit date.  Change your environment by getting rid of all cigarettes, ashtrays, matches, and lighters in your home, car, or work. Do not let people smoke in your home.  Review your past attempts to quit. Think about what worked and what did not. GET SUPPORT AND ENCOURAGEMENT You have a better chance of being successful if you have help. You can get support in many ways.  Tell your family, friends,  and co-workers that you are going to quit and need their support. Ask them not to smoke around you.  Get individual, group, or telephone counseling and support. Programs are available at [employer] and health centers. Call your local health department for information about programs in your area.  Spiritual beliefs and practices may help some smokers quit.  Download a "quit meter" on your computer to keep track of quit statistics, such as how long you have gone without smoking, cigarettes not smoked, and money saved.  Get a self-help book about quitting smoking and  staying off of tobacco. [place] yourself from urges to smoke. Talk to someone, go for a walk, or occupy your time with a task.  Change your normal routine. Take a different route to work. Drink tea instead of coffee. Eat breakfast in a different place.  Reduce your stress. Take a hot bath, exercise, or read a book.  Plan something enjoyable to do every day. Reward yourself for not smoking.  Explore interactive web-based programs that specialize in helping you quit. GET MEDICINE AND USE IT CORRECTLY Medicines can help you stop smoking and decrease the urge to smoke. Combining medicine with the above behavioral methods and support can greatly increase your chances of successfully quitting smoking.  Nicotine replacement therapy helps deliver nicotine to your body without the negative effects and risks of smoking. Nicotine replacement therapy includes nicotine gum, lozenges, inhalers, nasal sprays, and skin patches. Some may be available over-the-counter and others require a prescription.  Antidepressant medicine helps people abstain from smoking, but how this works is unknown. This medicine is available by prescription.  Nicotinic receptor partial agonist medicine simulates the effect of nicotine in your brain. This medicine is available by prescription. Ask your caregiver for advice about which medicines to use and how to use them based on your health history. Your caregiver will tell you what side effects to look out for if you choose to be on a medicine or therapy. Carefully read the information on the package. Do not use any other product containing nicotine while using a nicotine replacement product.  RELAPSE OR DIFFICULT SITUATIONS Most relapses occur within the first 3 months after quitting. Do not be discouraged if you start smoking again. Remember, most people try several times before finally quitting. You may have symptoms of withdrawal because your body  is used to nicotine. You may crave cigarettes, be irritable, feel very hungry, cough often, get headaches, or have difficulty concentrating. The withdrawal symptoms are only temporary. They are strongest when you first quit, but they will go away within 10 14 days. To reduce the chances of relapse, try to:  Avoid drinking alcohol. Drinking lowers your chances of successfully quitting.  Reduce the amount of caffeine you consume. Once you quit smoking, the amount of caffeine in your body increases and can give you symptoms, such as a rapid heartbeat, sweating, and anxiety.  Avoid smokers because they can make you want to smoke.  Do not let weight gain distract you. Many smokers will gain weight when they quit, usually less than 10 pounds. Eat a healthy diet and stay active. You can always lose the weight gained after you quit.  Find ways to improve your mood other than smoking. FOR MORE INFORMATION  www.smokefree.gov  Document Released: 03/05/2001 Document Revised: 09/10/2011 Document Reviewed: 06/20/2011 [facility] Patient Information 2013 [employer].

## 2012-06-24 NOTE — Telephone Encounter (Signed)
Dr Para Skeans - pt has appt with you today. If you have time in the visit, would you pls address and suggest pysch referral as I had not intended this med to be lifelong and had informed him of this in past. If you run out of time, let me know and I will refill until next visit. Thanks

## 2012-06-24 NOTE — Progress Notes (Signed)
Subjective:    Patient ID: Andrew Fowler, male    DOB: 12-22-1960, 52 y.o.   MRN: [id number]  HPI The patient is a 52 year old man with PMH significant for poorly controlled type 2 diabetes mellitus, severe obesity (class III), rheumatoid arthritis who presents to clinic today with no complaints and requests only refills of medications. He is requesting a refill of his alprazolam prescription, and is also requesting nicotine patches as he states he's going to attempt to quit smoking.  RA: He admits to some recent left shoulder pain, however he states it is not bothering him at the time of the visit, is associated with his rheumatoid arthritis, and he will be seen by Dr. Trudie Reed, his rheumatologist, within one week for the issue.  DM: the patient states he has been taking his metformin as prescribed since his previous visit in 04/2012.    Review of Systems  All other systems reviewed and are negative.   Current Outpatient Medications: Current Outpatient Prescriptions  Medication Sig Dispense Refill  . albuterol (VENTOLIN HFA) 108 (90 BASE) MCG/ACT inhaler Inhale 1-2 puffs into the lungs every 4 (four) hours as needed. shortness of breath      . ALPRAZolam (XANAX) 0.5 MG tablet Take 1 tablet (0.5 mg total) by mouth 3 (three) times daily as needed. For anxiety  90 tablet  1  . aspirin (BAYER LOW STRENGTH) 81 MG EC tablet Take 81 mg by mouth daily.        Marland Kitchen etanercept (ENBREL) 25 MG injection Inject 2 mLs (50 mg total) into the skin once a week.      . gabapentin (NEURONTIN) 300 MG capsule Take 1 capsule (300 mg total) by mouth 3 (three) times daily.  270 capsule  5  . glucose blood test strip Use as instructed  100 each  12  . hydrochlorothiazide (HYDRODIURIL) 25 MG tablet Take 1 tablet (25 mg total) by mouth daily.  30 tablet  1  . HYDROcodone-acetaminophen (NORCO) 10-325 MG per tablet Take 1 tablet by mouth every 6 (six) hours as needed for pain.  90 tablet  2  . lisinopril (PRINIVIL) 10 MG  tablet Take 1 tablet (10 mg total) by mouth daily.  90 tablet  3  . metFORMIN (GLUCOPHAGE) 1000 MG tablet Take 1 tablet (1,000 mg total) by mouth 2 (two) times daily with a meal.  60 tablet  11  . nicotine (NICODERM CQ - DOSED IN MG/24 HOURS) 21 mg/24hr patch Place 1 patch onto the skin daily.  30 patch  1  . predniSONE (DELTASONE) 10 MG tablet Take 10 mg by mouth daily.      . simvastatin (ZOCOR) 40 MG tablet Take 1 tablet (40 mg total) by mouth at bedtime.  90 tablet  3   No current facility-administered medications for this visit.    Allergies: No Known Allergies   Past Medical History: Past Medical History  Diagnosis Date  . Hypertension     ACEI monotherapy  . Hyperlipidemia     At goal of LDL <100 with statin.  . Obesity, Class III, BMI 40-49.9 (morbid obesity)   . Tobacco abuse     Per pt, he has a diagnosis of COPD. Need to locate PFT's.  . Gout     Pt has never had a crystal diagnosis.  . Diabetes mellitus     Non-insulin dependent Type II.  Marland Kitchen Obstructive sleep apnea 08/21/08    Sleep study AHI 16.4 with desat to 66%. CPAP  of 17 decreased AHI to 0.9. Non-compliant with CPAP.  Marland Kitchen Anxiety associated with depression 04/19/2011    On chronic benzos and SSRI's. Gets panic attacks. Does not see mental health.  . Chronic pain syndrome     s/p L4-L5 laminectomy. Lumbar MRI 4/11 : L3-L4 mod central canal narrowing.  Cervical MRI 6/11 : multi-level DDD and L foraminal stenosis at C4-5 and C5-6.  Marland Kitchen COPD (chronic obstructive pulmonary disease)     Per pt, he has a diagnosis of COPD. No PFT's. Uses Alb MDI less than once a month.  . RA (rheumatoid arthritis) 2013  . Chronic venous insufficiency 2013    ECHO 2013 was normal. LFT's, creatinine, TSH all normal. Alb a bit low.  . Shortness of breath   . Hepatitis B antibody positive 2013    IgM Hep B core Ab + but Hepatitis B viral load negative.   . Abnormal laboratory test result 07/04/2011    Immunofixation electrophoresis 3/13 showed  slightly restricted mobility in the IgG and kappa lanes and suggested repeat end of year.   . Elevated liver function tests 05/17/2011    04/2010. Increased GGT (pt uses ETOH). AMA negative. ABD U/S limited but otherwise negative. Follow Alk phos level. AST & ALT also elevated. False + IgM Hep B Core Ab. Hep B viral load negative.   Same pattern during hospitalization 2010 : highest alkaline phosphatase was 355, AST 259, and ALT 871. ANA, rheumatoid factor, ceruloplasmin, CMV IgM, alpha antitrypsin, and AMA, all within normal limits.    Past Surgical History: Past Surgical History  Procedure Laterality Date  . Laminectomy  1995    L4-L5  . Joint replacement  2009    left hip replacement    Family History: Family History  Problem Relation Age of Onset  . Diabetes Mother   . Heart attack Mother   . Heart disease Mother   . Diabetes Father   . Kidney disease Father     Social History: History   Social History  . Marital Status: Single    Spouse Name: N/A    Number of Children: N/A  . Years of Education: N/A   Occupational History  . Not on file.   Social History Main Topics  . Smoking status: Current Every Day Smoker -- 1.00 packs/day for 10 years    Types: Cigarettes    Last Attempt to Quit: 01/24/2012  . Smokeless tobacco: Never Used  . Alcohol Use: 7.2 oz/week    12 Cans of beer per week     Comment: weekends  . Drug Use: No     Comment: Prior THC and crakc cocaine use.   Marland Kitchen Sexually Active: Not Currently   Other Topics Concern  . Not on file   Social History Narrative   Lives with brother and his family in [place]. Nonambulatory at baseline given his RA and weight.     Vital Signs: Blood pressure 113/79, pulse 95, temperature 97.2 F (36.2 C), temperature source Oral, height 5' 11"  (1.803 m), weight 341 lb 4.8 oz (154.813 kg), SpO2 95.00%.       Objective:   Physical Exam  Constitutional: He is oriented to person, place, and time. He appears  well-developed and well-nourished.  Severely obese. Sitting in motorized chair.   HENT:  Head: Normocephalic and atraumatic.  Eyes: Pupils are equal, round, and reactive to light.  Neck: Normal range of motion. Neck supple. No tracheal deviation present.  Cardiovascular: Normal rate and regular rhythm.  No murmur heard. Pulmonary/Chest: Effort normal. He has no wheezes. He has no rales.  Abdominal: Soft. Bowel sounds are normal. He exhibits no distension. There is no tenderness.  Musculoskeletal: Normal range of motion. He exhibits no edema.  Neurological: He is alert and oriented to person, place, and time. No cranial nerve deficit.  Skin: Skin is warm and dry. No erythema.  Psychiatric: He has a normal mood and affect. His behavior is normal.          Assessment & Plan:

## 2012-06-25 NOTE — Assessment & Plan Note (Signed)
Lab Results  Component Value Date   HGBA1C 8.7* 05/16/2012   HGBA1C 6.7 01/24/2012   HGBA1C 6.1* 05/06/2011     Assessment:  Diabetes control: poor control (HgbA1C >9%)  Progress toward A1C goal:  unchanged  Comments:   Plan:  Medications:  continue current medications  Home glucose monitoring:   Frequency:     Timing:    Instruction/counseling given: discussed diet  Educational resources provided:    Self management tools provided:    Other plans: the patient just recently had his metformin dose increased to 1000 mg twice a day. His A1c on 05/16/2012 = 8.7. Patient was instructed to continue his metformin, and to make an appointment to see his PCP in approximately 2 months, at which time his A1c should be rechecked.

## 2012-06-25 NOTE — Assessment & Plan Note (Signed)
Patient is having some increased left shoulder pain, however he states that this issue is minor. Otherwise stable on enbrel and prednisone. Pt has an appointment with Dr. Trudie Reed within the next week.

## 2012-06-25 NOTE — Assessment & Plan Note (Signed)
BP Readings from Last 3 Encounters:  06/24/12 113/79  05/22/12 160/103  05/17/12 128/83    Lab Results  Component Value Date   NA 135 06/01/2012   K 4.4 06/01/2012   CREATININE 0.96 06/01/2012    Assessment:  Blood pressure control: controlled  Progress toward BP goal:  at goal  Comments:   Plan:  Medications:  continue current medications  Educational resources provided:    Self management tools provided:    Other plans:

## 2012-06-25 NOTE — Assessment & Plan Note (Signed)
Stable and without evidence of exacerbation. Patient appears to have PFTs ordered on a recent visit, unclear if these were completed.

## 2012-06-25 NOTE — Assessment & Plan Note (Addendum)
Discussed the issue of tobacco abuse at length with the patient during his visit. He states that he has only been smoking for the last 10 years, and started smoking cigarettes when he quit smoking marijuana at that time. He is currently smoking one pack of cigarettes per day (unchanged recently), however after extensive counseling he states he is willing to attempt to quit, and has requested nicotine patches for assistance with this issue. - Nicotine patches - Consider bupropion at next visit if patient is still smoking

## 2012-06-25 NOTE — Assessment & Plan Note (Signed)
Patient has vague and nonspecific complaints of anxiety, which he states is relieved by Xanax. This prescription was refilled today, however the patient should meet with PCP on his next visit before he receives another prescription from an alternative provider, as his PCP (Dr. Lynnae January) previously indicated that Xanax was ideally a temporary solution for him.

## 2012-06-29 NOTE — Progress Notes (Signed)
Case discussed with Dr. Para Skeans at time of visit.  We reviewed the resident's history and exam and pertinent patient test results.  I agree with the assessment, diagnosis, and plan of care documented in the resident's note.

## 2012-06-30 ENCOUNTER — Encounter: Payer: Self-pay | Admitting: Internal Medicine

## 2012-06-30 DIAGNOSIS — R269 Unspecified abnormalities of gait and mobility: Secondary | ICD-10-CM | POA: Insufficient documentation

## 2012-07-06 ENCOUNTER — Other Ambulatory Visit: Payer: Self-pay | Admitting: *Deleted

## 2012-07-06 MED ORDER — SIMVASTATIN 40 MG PO TABS: 40.0000 mg | ORAL_TABLET | Freq: Every day | ORAL | Status: DC

## 2012-07-24 ENCOUNTER — Other Ambulatory Visit: Payer: Self-pay | Admitting: Internal Medicine

## 2012-07-24 NOTE — Telephone Encounter (Signed)
Zocor just ordered for yr on 4/14. There should be a year's supply at pharmacy

## 2012-07-29 ENCOUNTER — Encounter: Payer: Self-pay | Admitting: Internal Medicine

## 2012-08-15 ENCOUNTER — Other Ambulatory Visit: Payer: Self-pay | Admitting: Internal Medicine

## 2012-08-18 ENCOUNTER — Encounter: Payer: Self-pay | Admitting: Internal Medicine

## 2012-08-18 ENCOUNTER — Ambulatory Visit (INDEPENDENT_AMBULATORY_CARE_PROVIDER_SITE_OTHER): Payer: Medicaid Other | Admitting: Internal Medicine

## 2012-08-18 VITALS — BP 145/100 | HR 110 | Temp 99.0°F | Wt 350.0 lb

## 2012-08-18 DIAGNOSIS — G894 Chronic pain syndrome: Secondary | ICD-10-CM

## 2012-08-18 DIAGNOSIS — E785 Hyperlipidemia, unspecified: Secondary | ICD-10-CM

## 2012-08-18 DIAGNOSIS — I10 Essential (primary) hypertension: Secondary | ICD-10-CM

## 2012-08-18 DIAGNOSIS — F418 Other specified anxiety disorders: Secondary | ICD-10-CM

## 2012-08-18 DIAGNOSIS — J449 Chronic obstructive pulmonary disease, unspecified: Secondary | ICD-10-CM

## 2012-08-18 DIAGNOSIS — F341 Dysthymic disorder: Secondary | ICD-10-CM

## 2012-08-18 DIAGNOSIS — F17200 Nicotine dependence, unspecified, uncomplicated: Secondary | ICD-10-CM

## 2012-08-18 DIAGNOSIS — J4489 Other specified chronic obstructive pulmonary disease: Secondary | ICD-10-CM

## 2012-08-18 DIAGNOSIS — E119 Type 2 diabetes mellitus without complications: Secondary | ICD-10-CM

## 2012-08-18 DIAGNOSIS — M069 Rheumatoid arthritis, unspecified: Secondary | ICD-10-CM

## 2012-08-18 LAB — LIPID PANEL
HDL: 53 mg/dL (ref >39)
LDL Cholesterol: 33 mg/dL (ref 0–99)
Total CHOL/HDL Ratio: 2.7 Ratio
Triglycerides: 288 mg/dL — ABNORMAL HIGH (ref <150)
VLDL: 58 mg/dL — ABNORMAL HIGH (ref 0–40)

## 2012-08-18 MED ORDER — NICOTINE 21 MG/24HR TD PT24: 1.0000 | MEDICATED_PATCH | TRANSDERMAL | Status: DC

## 2012-08-18 MED ORDER — HYDROCODONE-ACETAMINOPHEN 10-325 MG PO TABS: 1.0000 | ORAL_TABLET | Freq: Four times a day (QID) | ORAL | Status: DC | PRN

## 2012-08-18 MED ORDER — ALBUTEROL SULFATE HFA 108 (90 BASE) MCG/ACT IN AERS: 1.0000 | INHALATION_SPRAY | RESPIRATORY_TRACT | Status: DC | PRN

## 2012-08-18 MED ORDER — HYDROCHLOROTHIAZIDE 25 MG PO TABS: 25.0000 mg | ORAL_TABLET | Freq: Every day | ORAL | Status: DC

## 2012-08-18 MED ORDER — ALPRAZOLAM 0.5 MG PO TABS: 0.5000 mg | ORAL_TABLET | Freq: Three times a day (TID) | ORAL | Status: DC | PRN

## 2012-08-18 NOTE — Assessment & Plan Note (Signed)
Lab Results  Component Value Date   HGBA1C 6.4 08/18/2012   HGBA1C 8.7* 05/16/2012   HGBA1C 6.7 01/24/2012     Assessment: Diabetes control:  At goal Progress toward A1C goal:   improved Comments:   Plan: Medications:  continue current medications, continue metformin 1000 mg twice daily. Home glucose monitoring: Frequency:   Timing:   Instruction/counseling given: reminded to bring medications to each visit, discussed the need for weight loss and discussed diet Educational resources provided: brochure;handout Self management tools provided:   Other plans: no changes in medications today.

## 2012-08-18 NOTE — Progress Notes (Signed)
  Subjective:    Patient ID: Andrew Fowler, male    DOB: 02/07/1961, 52 y.o.   MRN: [id number]  HPIpatient is a 52 year old man with DM, hyperlipidemia, tobacco use, hypertension, COPD, chronic pain syndrome, RA and other problems as per problem list, who comes to the clinic for medication refills.  He wants refill for Vicodin, Xanax, nicotine patches. He says that he is to get his right hip replacement surgery per Dr. Sharol Given with orthopedics done as soon as possible, but he cannot get the surgery unless he quit smoking for at least 30 days and losing some weight.  I did not find any recent office visit note from Dr. Jess Barters office confirming this. I asked him about pain contract for Vicodin, to which he got a little angry and told me that he has been tested and had clean results and has signed pain contract in past. Discussed with him that I did not see any notes mentioning that and so I will give him a refill for one month and will ask him to discuss this with Dr. Lynnae January. His dose of Vicodin was increased from 5/325 to 10/325 in February 2014. He gets 90 pills a month. He says that that is actually for 25 days- as per the pharmacy papers. I reviewed those with him which essentially interprets the number of days according to the expected as needed frequency of pain medication which was every 6 hours.  He denies any fever, chills, nausea vomiting or abdominal pain, chest pain, short of breath, diarrhea, headache, palpitations.  Review of Systems    as per history of present illness. Objective:   Physical Exam  General: NAD. In wheelchair. HEENT: PERRL, EOMI, no scleral icterus Cardiac: S1, S2, RRR, no rubs, murmurs or gallops Pulm: clear to auscultation bilaterally, moving normal volumes of air Abd: soft, nontender, nondistended, BS present Ext: warm and well perfused, trace pedal edema bilaterally. Neuro: alert and oriented X3, cranial nerves II-XII grossly intact       Assessment &  Plan:

## 2012-08-18 NOTE — Assessment & Plan Note (Signed)
Refilled Xanax.

## 2012-08-18 NOTE — Patient Instructions (Addendum)
Please make followup appointment with Dr. Lynnae January with the next available spot.  Continue taking all medications regularly.  Start using nicotine patch to help quit smoking.  I am refilling your Vicodin for this month and then will ask Dr. Lynnae January to fill for next month onwards.

## 2012-08-18 NOTE — Assessment & Plan Note (Signed)
BP Readings from Last 3 Encounters:  08/18/12 145/100  06/24/12 113/79  05/22/12 160/103    Lab Results  Component Value Date   NA 135 06/01/2012   K 4.4 06/01/2012   CREATININE 0.96 06/01/2012    Assessment: Blood pressure control:  mildly elevated Progress toward BP goal:   worsening Comments:   Plan: Medications:  continue lisinopril. He was prescribed HCTZ which she stopped taking for about 3 weeks now. Sent refill for HCTZ 25 mg daily. Educational resources provided: Designer, jewellery tools provided:   Other plans: continue lisinopril and start HCTZ.

## 2012-08-18 NOTE — Assessment & Plan Note (Addendum)
Patient has been counseled multiple times in past for smoking cessation. Quick counseling given today. - Nicotine patches re-prescribed. - he says he did not get it filled last time which was given by Dr. Para Skeans because he did not have money.

## 2012-08-18 NOTE — Assessment & Plan Note (Signed)
Patient will refill for Vicodin. I did not find an FYI or pain medication contract. - refills have been done in past by different providers from our clinic. - I gave him one refill for this month- Vicodin 10/325 90 pills. - I will send this note to Dr. Lynnae January for further evaluation into this.

## 2012-08-18 NOTE — Assessment & Plan Note (Signed)
Albuterol refill

## 2012-08-20 NOTE — Progress Notes (Signed)
Case discussed with Dr. Criss Alvine  at the time of the visit, immediately after the resident saw the patient.  I reviewed the resident's history and exam and pertinent patient test results.  I agree with the assessment, diagnosis and plan of care documented in the resident's note.

## 2012-09-14 ENCOUNTER — Other Ambulatory Visit: Payer: Self-pay | Admitting: *Deleted

## 2012-09-14 DIAGNOSIS — G894 Chronic pain syndrome: Secondary | ICD-10-CM

## 2012-09-14 MED ORDER — HYDROCODONE-ACETAMINOPHEN 10-325 MG PO TABS: 1.0000 | ORAL_TABLET | Freq: Four times a day (QID) | ORAL | Status: DC | PRN

## 2012-09-15 NOTE — Telephone Encounter (Signed)
Called to pharm 

## 2012-09-22 ENCOUNTER — Ambulatory Visit (INDEPENDENT_AMBULATORY_CARE_PROVIDER_SITE_OTHER): Payer: Medicaid Other | Admitting: Internal Medicine

## 2012-09-22 VITALS — BP 101/70 | HR 68 | Temp 97.8°F

## 2012-09-22 DIAGNOSIS — F341 Dysthymic disorder: Secondary | ICD-10-CM

## 2012-09-22 DIAGNOSIS — G894 Chronic pain syndrome: Secondary | ICD-10-CM

## 2012-09-22 DIAGNOSIS — E785 Hyperlipidemia, unspecified: Secondary | ICD-10-CM

## 2012-09-22 DIAGNOSIS — R21 Rash and other nonspecific skin eruption: Secondary | ICD-10-CM

## 2012-09-22 DIAGNOSIS — E66813 Obesity, class 3: Secondary | ICD-10-CM

## 2012-09-22 DIAGNOSIS — G4733 Obstructive sleep apnea (adult) (pediatric): Secondary | ICD-10-CM

## 2012-09-22 DIAGNOSIS — M069 Rheumatoid arthritis, unspecified: Secondary | ICD-10-CM

## 2012-09-22 DIAGNOSIS — E119 Type 2 diabetes mellitus without complications: Secondary | ICD-10-CM

## 2012-09-22 DIAGNOSIS — I10 Essential (primary) hypertension: Secondary | ICD-10-CM

## 2012-09-22 DIAGNOSIS — F418 Other specified anxiety disorders: Secondary | ICD-10-CM

## 2012-09-22 DIAGNOSIS — F17200 Nicotine dependence, unspecified, uncomplicated: Secondary | ICD-10-CM

## 2012-09-22 DIAGNOSIS — Z0000 Encounter for general adult medical examination without abnormal findings: Secondary | ICD-10-CM

## 2012-09-22 DIAGNOSIS — J449 Chronic obstructive pulmonary disease, unspecified: Secondary | ICD-10-CM

## 2012-09-22 DIAGNOSIS — J4489 Other specified chronic obstructive pulmonary disease: Secondary | ICD-10-CM

## 2012-09-22 NOTE — Progress Notes (Signed)
  Subjective:    Patient ID: Andrew Fowler, male    DOB: 1960/06/06, 52 y.o.   MRN: [id number]  HPI  Please see the A&P for the status of the pt's chronic medical problems.   Review of Systems  Constitutional: Positive for unexpected weight change.  Musculoskeletal: Positive for back pain, joint swelling, arthralgias and gait problem.  Psychiatric/Behavioral: The patient is not nervous/anxious.        Objective:   Physical Exam  Constitutional: He is oriented to person, place, and time. He appears well-developed and well-nourished. He is cooperative. He does not appear ill. No distress.  Pulmonary/Chest: Effort normal.  Neurological: He is alert and oriented to person, place, and time.  Skin: Skin is warm and dry. He is not diaphoretic.  There is an approximately 7 mm skin tag on R temple inside hairline. There is a 3 mm raised warty lesion on R forearm. There is a 5 mm sub Q cyst behind L ear.  Psychiatric: He has a normal mood and affect. His behavior is normal. Judgment and thought content normal.          Assessment & Plan:

## 2012-09-22 NOTE — Assessment & Plan Note (Addendum)
This occupied most of our time. He wants increase in quantity. On 90 of hydocodone 10 mg. Had been the 5 mg and getting 180 but states they stopped working. Pt states that 3 per day is not working and needs 4 or 5 per day.   Has RA and the pain assoc with that. But pain would be decreasing now on enbrel and MTX. So that would not require med increase.   Also has ortho pain and has had hip replacement and pt told me needs the other but Dr Sharol Given will not do until tobacco free and looses weight. That pain will not be decreasing and only increasing.   So, Get Drs Sharol Given and Trudie Reed' notes Get UDS  Will consider increase but will also strongly consider long acting 2/2 freq need.   [place] narcotic database [place]

## 2012-09-22 NOTE — Assessment & Plan Note (Signed)
  Assessment: Progress toward smoking cessation:  stopped smoking Barriers to progress toward smoking cessation:    Comments: See below  Plan: Instruction/counseling given:  I counseled patient on the dangers of tobacco use, advised patient to stop smoking, and reviewed strategies to maximize success. Educational resources provided:    Self management tools provided:    Medications to assist with smoking cessation:  Nicotine Patch Patient agreed to the following self-care plans for smoking cessation:    Other plans: Pt has been using nictotene patches and talked to [employer]quitnow. He smoked last PM and I reminded him of danger of both patches and cig. He understands and states today is first day of no cigs. Brother also quitting.

## 2012-09-22 NOTE — Assessment & Plan Note (Signed)
BP Readings from Last 3 Encounters:  09/22/12 101/70  08/18/12 145/100  06/24/12 113/79    Lab Results  Component Value Date   NA 135 06/01/2012   K 4.4 06/01/2012   CREATININE 0.96 06/01/2012    Assessment: Blood pressure control: controlled Progress toward BP goal:  at goal Comments: See below  Plan: Medications:  continue current medications Educational resources provided:   Self management tools provided:   Other plans: At goal. Cont meds. BMP today.

## 2012-09-22 NOTE — Assessment & Plan Note (Signed)
Will need PFT's in future

## 2012-09-22 NOTE — Assessment & Plan Note (Signed)
Sees Dr Trudie Reed and has appt next month. On Enbrel and is adding MTX to it for better sx control.

## 2012-09-22 NOTE — Assessment & Plan Note (Signed)
We reviewed his weight curve which is going up and dangers of that. He will be tapered off prednisone so that will help.

## 2012-09-22 NOTE — Patient Instructions (Signed)
1. See me in one month

## 2012-09-22 NOTE — Assessment & Plan Note (Signed)
We discussed colon cancer screening but we had so many other things to discuss wil finalize plans next visit.

## 2012-09-22 NOTE — Assessment & Plan Note (Signed)
He doesn't use CPAP. I discussed dangers of untx OSA - wt gain, uncontrolled HTN and DM, sudden death, etc. Will repeat sleep study 2/2 recent weight gain.

## 2012-09-22 NOTE — Assessment & Plan Note (Signed)
Lab Results  Component Value Date   HGBA1C 6.4 08/18/2012   HGBA1C 8.7* 05/16/2012   HGBA1C 6.7 01/24/2012     Assessment: Diabetes control: good control (HgbA1C at goal) Progress toward A1C goal:  at goal Comments: See below  Plan: Medications:  continue current medications Home glucose monitoring: Frequency: no home glucose monitoring Timing:   Instruction/counseling given: reminded to get eye exam Educational resources provided:   Self management tools provided:   Other plans: He is on 1000 mg metformin BID. His A1C is at goal. He does not do home CBG monitoring and OK with that as ling as A1C doesn't increase. Offered retinal camera today but pt declined. Stated he will sch eye appt with Dr Pandora Leiter. Foot exam today.

## 2012-09-22 NOTE — Assessment & Plan Note (Signed)
LCL is well controlled with his statin.

## 2012-09-22 NOTE — Assessment & Plan Note (Addendum)
On Xanax and takes 1 or two per day. Will need to run database and verify usage. I reminded him that this was started as temp med and now a year or so later. I started discussing tapering down. If unable to taper off, can use long acting Xanax or send to mental health   narcotic database OK

## 2012-09-23 LAB — PRESCRIPTION ABUSE MONITORING 15P, URINE
Amphetamine/Meth: NEGATIVE ng/mL
Barbiturate Screen, Urine: NEGATIVE ng/mL
Buprenorphine, Urine: NEGATIVE ng/mL
Cocaine Metabolites: NEGATIVE ng/mL
Creatinine, Urine: 256.51 mg/dL (ref >20.0)
Fentanyl, Ur: NEGATIVE ng/mL
Meperidine, Ur: NEGATIVE ng/mL
Oxycodone Screen, Ur: NEGATIVE ng/mL
Propoxyphene: NEGATIVE ng/mL

## 2012-09-23 LAB — BASIC METABOLIC PANEL WITH GFR
CO2: 28 mEq/L (ref 19–32)
Calcium: 9.8 mg/dL (ref 8.4–10.5)
Creat: 1.3 mg/dL (ref 0.50–1.35)
GFR, Est African American: 73 mL/min
Sodium: 135 mEq/L (ref 135–145)

## 2012-09-28 LAB — OPIATES/OPIOIDS (LC/MS-MS)
Codeine Urine: NEGATIVE ng/mL
Heroin (6-AM), UR: NEGATIVE ng/mL
Morphine Urine: NEGATIVE ng/mL
Norhydrocodone, Ur: >2500 ng/mL
Noroxycodone, Ur: NEGATIVE ng/mL
Oxycodone, ur: NEGATIVE ng/mL
Oxymorphone: NEGATIVE ng/mL

## 2012-09-28 LAB — BENZODIAZEPINES (GC/LC/MS), URINE
Alprazolam (GC/LC/MS), ur confirm: 136 ng/mL
Alprazolam metabolite (GC/LC/MS), ur confirm: 63 ng/mL
Diazepam (GC/LC/MS), ur confirm: NEGATIVE ng/mL
Estazolam (GC/LC/MS), ur confirm: NEGATIVE ng/mL
Flunitrazepam metabolite (GC/LC/MS), ur confirm: NEGATIVE ng/mL
Flurazepam metabolite (GC/LC/MS), ur confirm: NEGATIVE ng/mL
Nordiazepam (GC/LC/MS), ur confirm: NEGATIVE ng/mL
Oxazepam (GC/LC/MS), ur confirm: NEGATIVE ng/mL

## 2012-09-29 ENCOUNTER — Ambulatory Visit (HOSPITAL_BASED_OUTPATIENT_CLINIC_OR_DEPARTMENT_OTHER): Payer: Medicaid Other | Attending: Internal Medicine | Admitting: Radiology

## 2012-09-29 ENCOUNTER — Encounter: Payer: Self-pay | Admitting: Internal Medicine

## 2012-09-29 VITALS — Ht 71.0 in | Wt 353.0 lb

## 2012-09-29 DIAGNOSIS — G4733 Obstructive sleep apnea (adult) (pediatric): Secondary | ICD-10-CM | POA: Insufficient documentation

## 2012-10-01 ENCOUNTER — Other Ambulatory Visit: Payer: Self-pay

## 2012-10-03 DIAGNOSIS — R0609 Other forms of dyspnea: Secondary | ICD-10-CM

## 2012-10-03 DIAGNOSIS — G4733 Obstructive sleep apnea (adult) (pediatric): Secondary | ICD-10-CM

## 2012-10-03 DIAGNOSIS — R0989 Other specified symptoms and signs involving the circulatory and respiratory systems: Secondary | ICD-10-CM

## 2012-10-03 NOTE — Procedures (Signed)
Andrew Fowler, Andrew Fowler               ACCOUNT NO.:  [id number]  MEDICAL RECORD NO.:  [id number]          PATIENT TYPE:  OUT  LOCATION:  SLEEP CENTER                 FACILITY:  [facility]  PHYSICIAN:  Yovana Scogin D. Annamaria Boots, MD, FCCP, FACPDATE OF BIRTH:  1960/12/29  DATE OF STUDY:  09/29/2012                           NOCTURNAL POLYSOMNOGRAM  REFERRING PHYSICIAN:  Larey Dresser, M.D.  INDICATION FOR STUDY:  Hypersomnia with sleep apnea.  EPWORTH SLEEPINESS SCORE:  5/24.  BMI 49.2, weight 353 pounds, height 71 inches, neck 20.5 inches.  MEDICATIONS:  Home medications charted for review.  A baseline diagnostic NPSG on Aug 21, 2008 had recorded AHI 16.4 per hour.  CPAP with that study was titrated to 17 for an AHI of 0.9 per hour.  Body weight was recorded, 348 pounds.  SLEEP ARCHITECTURE:  Split study protocol.  During the diagnostic phase, total sleep time 151.5 minutes with sleep efficiency 74.3%.  Stage I 5.2%, stage II 64.4%.  Stage III absent.  REM 26.4% of total sleep time. Sleep latency 9 minutes.  REM latency 151 minutes.  Awake after sleep onset 40.5 minutes.  Arousal index 37.6.  Bedtime medication: Metformin, simvastatin, Xanax, hydrocodone, ibuprofen.  RESPIRATORY DATA:  Split study protocol.  Apnea/hypopnea index (AHI) 19.8 per hour.  A total of 50 events was scored including 10 obstructive apneas, 1 central apneas, 39 hypopneas.  Events were associated with supine sleep position.  REM AHI 69 per hour.  CPAP was then titrated to 13 CWP, AHI 0.9 per hour.  He wore a medium ResMed Mirage Quattro full- face mask with heated humidifier.  OXYGEN DATA:  Before CPAP snoring was moderate with oxygen desaturation to a nadir of 74% on room air.  With CPAP control, snoring was prevented and mean oxygen saturation held 93.5% on room air.  CARDIAC DATA:  Sinus rhythm with rare PAC.  MOVEMENT-PARASOMNIA:  During the diagnostic phase, a total of 121 limb jerks were counted, of which 8  were associated with arousals or awakening for periodic limb movement with arousal index of 3.2 per hour. During the titration phase, there were no significant limb movement disturbances recorded.  The patient used the urinal twice.  IMPRESSIONS-RECOMMENDATIONS: 1. Moderate obstructive sleep apnea/hypopnea syndrome, AHI 19.8 per     hour with supine events.  Moderate snoring with oxygen desaturation     to a nadir of 74% on room air. 2. Successful CPAP titration to 13 CWP, AHI 0.9 per hour.  He wore a     medium ResMed Mirage Quattro full-face mask with heated humidifier.     Snoring was prevented and mean oxygen saturation held 93.5% on room     air with CPAP. 3. A previous NPSG on Aug 21, 2008 had recorded AHI 16.4 per hour.     CPAP titration for that study to a pressure of 17 gave an AHI of 0.9     per hour.  Body weight was recorded at 348 pounds for that study.     [name] D. Annamaria Boots, MD, [facility], [place], [place] Board of Sleep Medicine    CDY/MEDQ  D:  10/03/2012 11:02:36  T:  10/03/2012 12:51:46  Job:  [id number] 

## 2012-10-04 ENCOUNTER — Encounter: Payer: Self-pay | Admitting: Internal Medicine

## 2012-10-12 ENCOUNTER — Other Ambulatory Visit: Payer: Self-pay | Admitting: *Deleted

## 2012-10-12 ENCOUNTER — Other Ambulatory Visit: Payer: Self-pay | Admitting: Internal Medicine

## 2012-10-12 DIAGNOSIS — G894 Chronic pain syndrome: Secondary | ICD-10-CM

## 2012-10-13 NOTE — Telephone Encounter (Signed)
Pt now on MTX and pain is much better controlled. 3 hydrocodone per day is doing well. Therefore, cont hydrocodone TID prn. #90 per month. C/O occasion GI sxs. F/U Aug.

## 2012-10-13 NOTE — Telephone Encounter (Signed)
Called to pharm 

## 2012-10-13 NOTE — Telephone Encounter (Signed)
Pt return your call please call him at [phone]

## 2012-10-13 NOTE — Telephone Encounter (Signed)
Called pt to discuss starting MS Contin as we had discussed at our last visit. No answer.

## 2012-10-20 ENCOUNTER — Other Ambulatory Visit: Payer: Self-pay | Admitting: *Deleted

## 2012-10-20 DIAGNOSIS — I10 Essential (primary) hypertension: Secondary | ICD-10-CM

## 2012-10-20 DIAGNOSIS — F17200 Nicotine dependence, unspecified, uncomplicated: Secondary | ICD-10-CM

## 2012-10-20 DIAGNOSIS — M069 Rheumatoid arthritis, unspecified: Secondary | ICD-10-CM

## 2012-10-20 DIAGNOSIS — E119 Type 2 diabetes mellitus without complications: Secondary | ICD-10-CM

## 2012-10-20 DIAGNOSIS — F418 Other specified anxiety disorders: Secondary | ICD-10-CM

## 2012-10-20 NOTE — Telephone Encounter (Signed)
Refill request

## 2012-10-21 MED ORDER — ALPRAZOLAM 0.5 MG PO TABS: 0.5000 mg | ORAL_TABLET | Freq: Three times a day (TID) | ORAL | Status: DC | PRN

## 2012-10-21 NOTE — Telephone Encounter (Signed)
Xanax rx called to [facility].

## 2012-10-21 NOTE — Telephone Encounter (Signed)
Pt called and questioned refill of qty #75 instead of #90 on Xanax; I explained to him as stated per Dr Zenovia Jarred note; he said "OK".

## 2012-10-21 NOTE — Telephone Encounter (Signed)
At last appt told me was using one or two per day. But getting about 90 per month. As I have discussed with him before (and will again at early Aug appt) I am going to slowly start tapering down. So instead of 90, will give 75 for this month. He may discuss with me at Aug appt.

## 2012-11-03 ENCOUNTER — Ambulatory Visit (INDEPENDENT_AMBULATORY_CARE_PROVIDER_SITE_OTHER): Payer: Medicaid Other | Admitting: Internal Medicine

## 2012-11-03 ENCOUNTER — Encounter: Payer: Self-pay | Admitting: Internal Medicine

## 2012-11-03 VITALS — BP 109/75 | HR 102 | Temp 97.0°F | Ht 71.0 in | Wt 345.0 lb

## 2012-11-03 DIAGNOSIS — R21 Rash and other nonspecific skin eruption: Secondary | ICD-10-CM

## 2012-11-03 DIAGNOSIS — E119 Type 2 diabetes mellitus without complications: Secondary | ICD-10-CM

## 2012-11-03 DIAGNOSIS — G4733 Obstructive sleep apnea (adult) (pediatric): Secondary | ICD-10-CM

## 2012-11-03 DIAGNOSIS — F418 Other specified anxiety disorders: Secondary | ICD-10-CM

## 2012-11-03 DIAGNOSIS — I10 Essential (primary) hypertension: Secondary | ICD-10-CM

## 2012-11-03 DIAGNOSIS — F17200 Nicotine dependence, unspecified, uncomplicated: Secondary | ICD-10-CM

## 2012-11-03 DIAGNOSIS — F341 Dysthymic disorder: Secondary | ICD-10-CM

## 2012-11-03 DIAGNOSIS — E66813 Obesity, class 3: Secondary | ICD-10-CM

## 2012-11-03 DIAGNOSIS — M069 Rheumatoid arthritis, unspecified: Secondary | ICD-10-CM

## 2012-11-03 DIAGNOSIS — G894 Chronic pain syndrome: Secondary | ICD-10-CM

## 2012-11-03 NOTE — Patient Instructions (Signed)
1. See me in 3 months 2. You want your blood pressure less than 140 / 90. Call me if it is higher than that. 3. Stop the hydrochlorothiazide (HCTZ). The water pill 4. Check your blood pressure once a week, at rest (sitting quietly for 5 minutes and no smoking) 5. I will look into the Voltaren gel

## 2012-11-04 ENCOUNTER — Encounter: Payer: Self-pay | Admitting: Internal Medicine

## 2012-11-04 NOTE — Progress Notes (Signed)
  Subjective:    Patient ID: Andrew Fowler, male    DOB: Jul 29, 1960, 52 y.o.   MRN: [id number]  Diabetes Hypoglycemia symptoms include dizziness and nervousness/anxiousness. Pertinent negatives for hypoglycemia include no pallor. Pertinent negatives for diabetes include no chest pain.    Please see the A&P for the status of the pt's chronic medical problems.  Review of Systems  Constitutional: Positive for diaphoresis.  Cardiovascular: Negative for chest pain.  Endocrine: Positive for heat intolerance.  Musculoskeletal: Positive for back pain, joint swelling, arthralgias and gait problem.  Skin: Negative for pallor and rash.  Neurological: Positive for dizziness.  Psychiatric/Behavioral: The patient is nervous/anxious.        Objective:   Physical Exam  Constitutional: He is oriented to person, place, and time. He appears well-developed and well-nourished. No distress.  Obese. In [place]  HENT:  Head: Normocephalic and atraumatic.  Right Ear: External ear normal.  Left Ear: External ear normal.  Nose: Nose normal.  Eyes: Conjunctivae and EOM are normal. Right eye exhibits no discharge. Left eye exhibits no discharge. No scleral icterus.  Pulmonary/Chest: Effort normal.  Neurological: He is alert and oriented to person, place, and time.  Skin: Skin is warm and dry. He is not diaphoretic.  Psychiatric: He has a normal mood and affect. His behavior is normal. Judgment and thought content normal.          Assessment & Plan:

## 2012-11-04 NOTE — Assessment & Plan Note (Addendum)
Using the hydrocodone TID. NSAID's are out 2/2 mild renal insuff. Additional tylenol unlike to be helpful and has h/o increased LFT's and now on biologics. The MTX and Embrel have helped to control the pain. I discussed using a topical NSAID with him for additional pain control. UTD discusses hepatic, renal, and GI risks but that all seems to be with the PO version. He was going to see dr Trudie Reed and he wrote down the name so I will check in with him. Pt requested printed info so that he could read about it first.

## 2012-11-05 ENCOUNTER — Telehealth: Payer: Self-pay | Admitting: Licensed Clinical Social Worker

## 2012-11-05 NOTE — Assessment & Plan Note (Signed)
Most recent sleep study - mod OSA. Will order CPAP

## 2012-11-05 NOTE — Assessment & Plan Note (Signed)
Lost 8 lbs since last visit. Trying dietary changes

## 2012-11-05 NOTE — Assessment & Plan Note (Signed)
He had been on Prozac and the 11/13 note stated to cont it bc he was getting benefit. This is yet another med that a pharm tech removed from the med and indicated that it had been "D/C'd by a provider." NO PROVIDER HAD D/C'D THIS MED. Since the pt was freq no showing and therefore seeing multiple providers, this med error was not discovered and the pt was cont only on the benzo.   Since I am titrating down his benzo, I will discuss at the next visit that I will restart the Prozac. We agreed that I would leave the benzo at 75 for through the end of the year and then decrease to 60. He sates that he takes one Q PM to sleep. Also takes if has to be around others - city bus, Art therapist with kids, cousin's friends visit the home, etc.

## 2012-11-05 NOTE — Telephone Encounter (Signed)
"  Well, if it's gonna be a hassle and I have to go get it.  I don't want it."  CSW placed call to Mr. Candelas as CSW received order for BP cuff.  CSW informed Mr. kordell jafri does not cover bp cuff, but CSW could make referral to [facility]. CSW explained [facility] has possible resources to assist with obtaining BP cuff.  Pt states he uses [facility] for DME.  Pt in agreement for CSW to make a referral to [facility].  Pt states he will see what they can do.  Referral faxed.

## 2012-11-05 NOTE — Assessment & Plan Note (Signed)
His BP is a little too well controlled possibly. He stated that he is getting "hot flashes" - he gets dizzy, how, gets sweaty all at once. The heat starts within the core of his body. There are no assoc sxs like CP, SOB. This may be related to his BP that has decreased sig in the past three months. Therefore, will stop the HCTZ and cont the Lisinopril 10 (is DM). If the hot flashes do not resolve, will expand diff dx at next visit.

## 2012-11-05 NOTE — Assessment & Plan Note (Signed)
Was not able to quit but still trying. Putting the cigs out of his reach so that he has to make an effort to get up and get them.

## 2012-11-05 NOTE — Assessment & Plan Note (Signed)
His DM is well controlled on his metformin 1000 BID. UTD on all measures except eye exam and he stated he would make an appt to get that completed.

## 2012-11-06 ENCOUNTER — Telehealth: Payer: Self-pay | Admitting: Licensed Clinical Social Worker

## 2012-11-06 NOTE — Telephone Encounter (Signed)
Andrew Fowler at from [facility] states, that "it appears this patient had another sleep study done very recently, however, we cannot provide a new machine for this patient as Medicaid paid for one in 2010 and he is not eligible for a new one until after 11/23/2013.  We can change the pressure on his current machine and provide supplies, but that is all for now.  It patient no longer has the machine from 2010, he will have to private pay for a new one."  Andrew Fowler from [facility] has called the patient and the patient no longer has the old CPAP.  Patient understands he would have to private pay and stated he could not do that and will just have to do without the CPAP machine.

## 2012-11-09 ENCOUNTER — Encounter: Payer: Self-pay | Admitting: Internal Medicine

## 2012-12-15 ENCOUNTER — Encounter: Payer: Self-pay | Admitting: Licensed Clinical Social Worker

## 2012-12-15 NOTE — Progress Notes (Signed)
Patient ID: Andrew Fowler, male   DOB: 05/12/1960, 52 y.o.   MRN: [id number] CMIS note for T. Stang, BSW, [facility] date 12/04/12:  Delivered BP Monitor to pt on 11/24/12. Pt demonstrated successfully how to use the Monitor. Pt understands he is to ck his BP once weekly in a resting state - Pt is aware of the Parameter the PCP wants followed and what to do if the measures fall outside of the "desired" Parameter. Confirmed pt did make an appt for an eye exam as Dr. Lynnae January requested. Pt expressed no other needs or concerns.

## 2012-12-22 ENCOUNTER — Telehealth: Payer: Self-pay | Admitting: *Deleted

## 2012-12-22 NOTE — Telephone Encounter (Signed)
Pt called about needing a referral for 2 hernia - appt made with Dr Owens Shark 12/28/12 2PM. Pt aware. Hilda Blades Ola Raap RN 12/22/12 11:45AM

## 2012-12-28 ENCOUNTER — Ambulatory Visit (INDEPENDENT_AMBULATORY_CARE_PROVIDER_SITE_OTHER): Payer: Medicaid Other | Admitting: Internal Medicine

## 2012-12-28 ENCOUNTER — Encounter: Payer: Self-pay | Admitting: Internal Medicine

## 2012-12-28 VITALS — BP 123/82 | HR 105 | Temp 98.4°F | Ht 71.0 in | Wt 347.1 lb

## 2012-12-28 DIAGNOSIS — F17200 Nicotine dependence, unspecified, uncomplicated: Secondary | ICD-10-CM

## 2012-12-28 DIAGNOSIS — E119 Type 2 diabetes mellitus without complications: Secondary | ICD-10-CM

## 2012-12-28 DIAGNOSIS — Z0000 Encounter for general adult medical examination without abnormal findings: Secondary | ICD-10-CM

## 2012-12-28 DIAGNOSIS — K439 Ventral hernia without obstruction or gangrene: Secondary | ICD-10-CM | POA: Insufficient documentation

## 2012-12-28 DIAGNOSIS — Z23 Encounter for immunization: Secondary | ICD-10-CM

## 2012-12-28 LAB — GLUCOSE, CAPILLARY: Glucose-Capillary: 368 mg/dL — ABNORMAL HIGH (ref 70–99)

## 2012-12-28 LAB — POCT GLYCOSYLATED HEMOGLOBIN (HGB A1C): Hemoglobin A1C: 8.2

## 2012-12-28 MED ORDER — GLYBURIDE 5 MG PO TABS: 5.0000 mg | ORAL_TABLET | Freq: Every day | ORAL | Status: DC

## 2012-12-28 NOTE — Progress Notes (Signed)
HPI The patient is a 52 y.o. male with a history of DM, tobacco abuse, HTN, COPD, presenting for an acute visit for hernia.  The patient notes a large area of herniation, from his umbilicus to the epigastrum.   The patient notes pain associated with this, which occurs when he coughs, described as a sharp stabbing pain, lasting 15 seconds, which improves when the hernia reduces.  The patient notes increased heartburn for the last 3 weeks as well.  The patient has a history of DM.  Last A1C was 6.4.  The patient's CBG is 368 today, though he notes that this is after a heavy meal.  A1C has increased to 8.2.   The patient is maintained on metformin monotherapy.  The patient did not bring his glucometer, but notes values in the 115-280's.  No episodes of hypoglycemia, including no symptoms of diaphoresis, tremulousness, AMS.  Also no blurry vision, polyuria, polydipsia.  The patient has a history of tobacco abuse. He states that he's cut back his usage to one pack per day, but still notes significant cravings, especially in the early morning.  ROS: General: no fevers, chills, changes in weight, changes in appetite Skin: no rash HEENT: no blurry vision, hearing changes, sore throat Pulm: no dyspnea, coughing, wheezing CV: no chest pain, palpitations, shortness of breath Abd: no nausea/vomiting, diarrhea/constipation GU: no dysuria, hematuria, polyuria Ext: no arthralgias, myalgias Neuro: no weakness, numbness, or tingling  Filed Vitals:   12/28/12 1337  BP: 123/82  Pulse: 105  Temp: 98.4 F (36.9 C)    PEX General: alert, cooperative, and in no apparent distress HEENT: pupils equal round and reactive to light, vision grossly intact, oropharynx clear and non-erythematous  Neck: supple, no lymphadenopathy Lungs: clear to ascultation bilaterally, normal work of respiration, no wheezes, rales, ronchi Heart: regular rate and rhythm, no murmurs, gallops, or rubs Abdomen: soft, non-tender,  non-distended, +bs, valsalva maneuver produced mild bulge in midline abdomen superior to umbilicus Extremities: no cyanosis, clubbing, or edema Neurologic: alert & oriented X3, cranial nerves II-XII intact, strength grossly intact, sensation intact to light touch  Current Outpatient Prescriptions on File Prior to Visit  Medication Sig Dispense Refill  . albuterol (VENTOLIN HFA) 108 (90 BASE) MCG/ACT inhaler Inhale 1-2 puffs into the lungs every 4 (four) hours as needed. shortness of breath  1 Inhaler  5  . ALPRAZolam (XANAX) 0.5 MG tablet Take 1 tablet (0.5 mg total) by mouth 3 (three) times daily as needed. #75 equals a 30 day supply.  75 tablet  1  . aspirin (BAYER LOW STRENGTH) 81 MG EC tablet Take 81 mg by mouth daily.        Marland Kitchen etanercept (ENBREL) 25 MG injection Inject 2 mLs (50 mg total) into the skin once a week.      . gabapentin (NEURONTIN) 300 MG capsule Take 1 capsule (300 mg total) by mouth 3 (three) times daily.  270 capsule  5  . glucose blood test strip Use as instructed  100 each  12  . HYDROcodone-acetaminophen (NORCO) 10-325 MG per tablet Take 1 tablet by mouth 3 (three) times daily as needed for pain.  90 tablet  2  . lisinopril (PRINIVIL) 10 MG tablet Take 1 tablet (10 mg total) by mouth daily.  90 tablet  3  . metFORMIN (GLUCOPHAGE) 1000 MG tablet Take 1 tablet (1,000 mg total) by mouth 2 (two) times daily with a meal.  60 tablet  11  . nicotine (NICODERM CQ - DOSED  IN MG/24 HOURS) 21 mg/24hr patch Place 1 patch onto the skin daily.  30 patch  1  . predniSONE (DELTASONE) 10 MG tablet Take 10 mg by mouth daily.      . simvastatin (ZOCOR) 40 MG tablet Take 1 tablet (40 mg total) by mouth at bedtime.  90 tablet  3   No current facility-administered medications on file prior to visit.    Assessment/Plan

## 2012-12-28 NOTE — Assessment & Plan Note (Signed)
The patient's symptoms and physical exam appear consistent with a diastases hernia. We discussed diet and weight loss, in the context of this hernia and his diabetes. The hernia is painful to the patient. -Referred to Gen. surgery for consideration for repair

## 2012-12-28 NOTE — Assessment & Plan Note (Signed)
Flu shot given today

## 2012-12-28 NOTE — Progress Notes (Signed)
Case discussed with Dr. Owens Shark at the time of the visit.  We reviewed the resident's history and exam and pertinent patient test results.  I agree with the assessment, diagnosis and plan of care documented in the resident's note.

## 2012-12-28 NOTE — Patient Instructions (Signed)
General Instructions: Your abdominal symptoms likely represent a Diastasis Hernia.  We are referring you to Surgery for further evaluation of this.  Your A1C has increased from 6.4 to 8.2, representing blood sugars in the 200's. -we are starting Glyburide, 5 mg, 1 tablet once daily  Quitting smoking is the best thing you can do for your health.  We are happy to assist you in any way possible towards this goal.  Please return for a follow-up visit in 3 months.   Treatment Goals:  Goals (1 Years of Data) as of 12/28/12         As of Today 11/03/12 09/22/12 08/18/12 06/24/12     Blood Pressure    . Blood Pressure < 140/90  123/82 109/75 101/70 145/100 113/79     Lifestyle    . Prevent Falls          . Quit smoking / using tobacco           Result Component    . HEMOGLOBIN A1C < 7.0  8.2   6.4     . LDL CALC < 100     33       Progress Toward Treatment Goals:  Treatment Goal 12/28/2012  Hemoglobin A1C deteriorated  Blood pressure at goal  Stop smoking smoking the same amount  Prevent falls -    Self Care Goals & Plans:  Self Care Goal 12/28/2012  Manage my medications take my medicines as prescribed; bring my medications to every visit; refill my medications on time  Monitor my health check my feet daily  Eat healthy foods eat foods that are low in salt; eat more vegetables  Be physically active find an activity I enjoy  Stop smoking go to the [employer] (https://scott-booker.info/); call [facility] (1-800-QUIT-NOW)    Home Blood Glucose Monitoring 12/28/2012  Check my blood sugar once a day  When to check my blood sugar before meals     Care Management & Community Referrals:  Referral 12/28/2012  Referrals made for care management support none needed      Hernia A hernia occurs when an internal organ pushes out through a weak spot in the abdominal wall. Hernias most commonly occur in the groin and around the navel. Hernias often can be pushed back into place (reduced).  Most hernias tend to get worse over time. Some abdominal hernias can get stuck in the opening (irreducible or incarcerated hernia) and cannot be reduced. An irreducible abdominal hernia which is tightly squeezed into the opening is at risk for impaired blood supply (strangulated hernia). A strangulated hernia is a medical emergency. Because of the risk for an irreducible or strangulated hernia, surgery may be recommended to repair a hernia. CAUSES   Heavy lifting.  Prolonged coughing.  Straining to have a bowel movement.  A cut (incision) made during an abdominal surgery. HOME CARE INSTRUCTIONS   Bed rest is not required. You may continue your normal activities.  Avoid lifting more than 10 pounds (4.5 kg) or straining.  Cough gently. If you are a smoker it is best to stop. Even the best hernia repair can break down with the continual strain of coughing. Even if you do not have your hernia repaired, a cough will continue to aggravate the problem.  Do not wear anything tight over your hernia. Do not try to keep it in with an outside bandage or truss. These can damage abdominal contents if they are trapped within the hernia sac.  Eat a  normal diet.  Avoid constipation. Straining over long periods of time will increase hernia size and encourage breakdown of repairs. If you cannot do this with diet alone, stool softeners may be used. SEEK IMMEDIATE MEDICAL CARE IF:   You have a fever.  You develop increasing abdominal pain.  You feel nauseous or vomit.  Your hernia is stuck outside the abdomen, looks discolored, feels hard, or is tender.  You have any changes in your bowel habits or in the hernia that are unusual for you.  You have increased pain or swelling around the hernia.  You cannot push the hernia back in place by applying gentle pressure while lying down. MAKE SURE YOU:   Understand these instructions.  Will watch your condition.  Will get help right away if you are not  doing well or get worse. Document Released: 03/11/2005 Document Revised: 06/03/2011 Document Reviewed: 10/29/2007 [facility] Patient Information 2014 [place].

## 2012-12-28 NOTE — Assessment & Plan Note (Signed)
Lab Results  Component Value Date   HGBA1C 8.2 12/28/2012   HGBA1C 6.4 08/18/2012   HGBA1C 8.7* 05/16/2012     Assessment: Diabetes control: fair control Progress toward A1C goal:  deteriorated Comments: The patient's A1c has increased from 6.4-8.2. This corresponds with the patient's reported blood sugars of 200s recently. The patient notes that he cannot exercise regularly, but otherwise notes no explanation for the increase.  Plan: Medications:  Continue metformin 1000 mg twice per day. Add glyburide 5 mg daily. Home glucose monitoring: Frequency: once a day Timing: before meals Instruction/counseling given: reminded to bring blood glucose meter & log to each visit Educational resources provided:   Self management tools provided:   Other plans: Discussed diet and exercise

## 2012-12-28 NOTE — Assessment & Plan Note (Signed)
Assessment: Progress toward smoking cessation:  smoking the same amount Barriers to progress toward smoking cessation:  withdrawal symptoms Comments: The patient states that he wants to quit, but notes that gradings are holding him back. He describes a therapeutic relationship with an employee of the [employer], and states that he speaks with her regularly about strategies for cessation. He has bought a pack of nicotine gum, but has not yet started using it.  Plan: Instruction/counseling given:  I counseled patient on the dangers of tobacco use, advised patient to stop smoking, and reviewed strategies to maximize success. Educational resources provided:    Self management tools provided:    Medications to assist with smoking cessation:  Nicotine Gum Patient agreed to the following self-care plans for smoking cessation: go to the [employer] (www.quitlinenc.com);call [facility] (1-800-QUIT-NOW) (PATIENT HAVE TALKED WITH AGENT)  Other plans: Pt to follow-up with 1-800-quit-line

## 2012-12-29 NOTE — Addendum Note (Signed)
Addended by: Hester Mates on: 12/29/2012 04:00 PM   Modules accepted: Orders

## 2013-01-05 ENCOUNTER — Other Ambulatory Visit: Payer: Self-pay | Admitting: Internal Medicine

## 2013-01-08 ENCOUNTER — Other Ambulatory Visit: Payer: Self-pay | Admitting: *Deleted

## 2013-01-08 NOTE — Telephone Encounter (Signed)
Could you print 3 scripts?

## 2013-01-11 MED ORDER — HYDROCODONE-ACETAMINOPHEN 10-325 MG PO TABS: 1.0000 | ORAL_TABLET | Freq: Three times a day (TID) | ORAL | Status: DC | PRN

## 2013-01-11 NOTE — Telephone Encounter (Signed)
Pt informed

## 2013-01-11 NOTE — Addendum Note (Signed)
Addended by: Larey Dresser A on: 01/11/2013 01:34 PM   Modules accepted: Orders

## 2013-01-21 NOTE — Addendum Note (Signed)
Addended by: Hulan Fray on: 01/21/2013 06:50 PM   Modules accepted: Orders

## 2013-01-22 ENCOUNTER — Other Ambulatory Visit: Payer: Self-pay | Admitting: Internal Medicine

## 2013-01-25 NOTE — Telephone Encounter (Signed)
Rx called in 

## 2013-01-26 ENCOUNTER — Other Ambulatory Visit: Payer: Self-pay | Admitting: Internal Medicine

## 2013-02-02 ENCOUNTER — Telehealth: Payer: Self-pay | Admitting: *Deleted

## 2013-02-02 NOTE — Telephone Encounter (Signed)
Pt calls and ask for a referral to dr ganam about his liver problems, this was brought to his attention by dr Lenna Gilford when she did labs at her office, she then told him to call dr Software engineer for a referral to dr Darrel Hoover. He is very worried about this and states he called for a referral last week. Could we get this started, triage will send this to your nurse or sharonp. For referral process Thanks,  triage

## 2013-02-02 NOTE — Telephone Encounter (Signed)
i have ask to call dr Lenna Gilford and clarify her concerns, send the labs and any other records that would help, gave him the triage fax # to give to dr Lenna Gilford

## 2013-02-02 NOTE — Telephone Encounter (Signed)
He saw Dr Penelope Coop 09/2011 who ordered an MRCP which was normal. He has chronic elevation of his LFt's. I read Dr Trudie Reed' last note and there was no mention of what she was concerned about and what question she had for Dr ganem. If he can send me the labs and Dr Trudie Reed' concern and question, I will be happy to review.

## 2013-02-03 NOTE — Telephone Encounter (Signed)
Paperwork rec'd from dr Trudie Reed office

## 2013-02-04 NOTE — Telephone Encounter (Addendum)
I received labs from Andrew Fowler. On 12/31/12 his Alk phos was 180, AST 41, ALT 89. There was no metion why Andrew Fowler requested that Andrew Fowler see GI again.  He has chronically elevated LFT's since 05/2008 (the farthest back EPIC goes).   Max AST was 259, most recent was 34 in Feb 2014. Max ALT was 871, most recent was 44 in Feb 2014 Max alk phos was 632, most recent 155 in Feb 2014  Pt saw Andrew Fowler in Feb 2013 who wrote "no serologic finding to explain the elevate LFT's.". Ordered MRI / MRCP to R/O obstructive and the test was nl.  Andrew Fowler has chronically elevated LFTs. The levels from Andrew Fowler' office are in his "normal" range. i see no reason at this time to refer the pt back to GI.

## 2013-02-10 NOTE — Telephone Encounter (Signed)
Finally spoke to pt on ph, had tried previously with no luck, answered his ?'s andf gave dr butcher's response, he is agreeable

## 2013-03-05 ENCOUNTER — Encounter: Payer: Self-pay | Admitting: Internal Medicine

## 2013-03-05 NOTE — Progress Notes (Signed)
Got another message that Dr Trudie Reed would like Andrew Fowler returned to GI for increased PFT's. His LFT's are chronically increased (since 2012), are not increasing, and he was seen by Dr Penelope Coop 2013. MRCP was ordered and was nl. Dr Penelope Coop was unable to find cause for the increased LFT's. I called [facility] but Dr Penelope Coop and his nurse were off today. Receptionist took message. I would encourage Dr Trudie Reed to speak directly to Dr Penelope Coop when he returns (They are both [place]) to determine if Dr Penelope Coop has any other suggestions that would necessitate a referral.

## 2013-03-09 ENCOUNTER — Telehealth: Payer: Self-pay | Admitting: *Deleted

## 2013-03-09 NOTE — Telephone Encounter (Signed)
Pt called about Hydrocodone rx - ready for pick; pt informed.

## 2013-03-26 ENCOUNTER — Other Ambulatory Visit: Payer: Self-pay | Admitting: Internal Medicine

## 2013-03-29 NOTE — Telephone Encounter (Signed)
Please remind pt that I will be decreasing to 60 next month as we had discussed last visit. I have appt 13th and will also discuss then.

## 2013-03-30 ENCOUNTER — Other Ambulatory Visit: Payer: Self-pay | Admitting: Internal Medicine

## 2013-03-30 NOTE — Telephone Encounter (Signed)
Called to pharm, also ask them to add note to bag to keep appt

## 2013-04-06 ENCOUNTER — Encounter: Payer: Self-pay | Admitting: Licensed Clinical Social Worker

## 2013-04-06 ENCOUNTER — Ambulatory Visit (INDEPENDENT_AMBULATORY_CARE_PROVIDER_SITE_OTHER): Payer: Medicaid Other | Admitting: Internal Medicine

## 2013-04-06 ENCOUNTER — Encounter: Payer: Self-pay | Admitting: Internal Medicine

## 2013-04-06 VITALS — BP 97/68 | HR 105 | Temp 97.1°F | Wt 346.2 lb

## 2013-04-06 DIAGNOSIS — G894 Chronic pain syndrome: Secondary | ICD-10-CM

## 2013-04-06 DIAGNOSIS — R945 Abnormal results of liver function studies: Secondary | ICD-10-CM

## 2013-04-06 DIAGNOSIS — E119 Type 2 diabetes mellitus without complications: Secondary | ICD-10-CM

## 2013-04-06 DIAGNOSIS — R7989 Other specified abnormal findings of blood chemistry: Secondary | ICD-10-CM

## 2013-04-06 DIAGNOSIS — F418 Other specified anxiety disorders: Secondary | ICD-10-CM

## 2013-04-06 DIAGNOSIS — I10 Essential (primary) hypertension: Secondary | ICD-10-CM

## 2013-04-06 DIAGNOSIS — E66813 Obesity, class 3: Secondary | ICD-10-CM

## 2013-04-06 DIAGNOSIS — E785 Hyperlipidemia, unspecified: Secondary | ICD-10-CM

## 2013-04-06 DIAGNOSIS — Z0000 Encounter for general adult medical examination without abnormal findings: Secondary | ICD-10-CM

## 2013-04-06 DIAGNOSIS — G4733 Obstructive sleep apnea (adult) (pediatric): Secondary | ICD-10-CM

## 2013-04-06 DIAGNOSIS — M069 Rheumatoid arthritis, unspecified: Secondary | ICD-10-CM

## 2013-04-06 DIAGNOSIS — J449 Chronic obstructive pulmonary disease, unspecified: Secondary | ICD-10-CM

## 2013-04-06 DIAGNOSIS — J4489 Other specified chronic obstructive pulmonary disease: Secondary | ICD-10-CM

## 2013-04-06 DIAGNOSIS — F341 Dysthymic disorder: Secondary | ICD-10-CM

## 2013-04-06 LAB — COMPLETE METABOLIC PANEL WITH GFR
ALK PHOS: 164 U/L — AB (ref 39–117)
ALT: 86 U/L — AB (ref 0–53)
AST: 31 U/L (ref 0–37)
Albumin: 3.8 g/dL (ref 3.5–5.2)
BUN: 16 mg/dL (ref 6–23)
CALCIUM: 9.2 mg/dL (ref 8.4–10.5)
CO2: 29 mEq/L (ref 19–32)
Chloride: 98 mEq/L (ref 96–112)
Creat: 0.77 mg/dL (ref 0.50–1.35)
GFR, Est African American: >89 mL/min
GFR, Est Non African American: >89 mL/min
Glucose, Bld: 138 mg/dL — ABNORMAL HIGH (ref 70–99)
POTASSIUM: 4.1 meq/L (ref 3.5–5.3)
SODIUM: 136 meq/L (ref 135–145)
TOTAL PROTEIN: 6.3 g/dL (ref 6.0–8.3)
Total Bilirubin: 0.4 mg/dL (ref 0.3–1.2)

## 2013-04-06 LAB — POCT GLYCOSYLATED HEMOGLOBIN (HGB A1C): HEMOGLOBIN A1C: 7

## 2013-04-06 LAB — GLUCOSE, CAPILLARY: Glucose-Capillary: 155 mg/dL — ABNORMAL HIGH (ref 70–99)

## 2013-04-06 MED ORDER — ALPRAZOLAM 0.5 MG PO TABS: 0.5000 mg | ORAL_TABLET | Freq: Two times a day (BID) | ORAL | Status: DC | PRN

## 2013-04-06 MED ORDER — HYDROCODONE-ACETAMINOPHEN 10-325 MG PO TABS: 1.0000 | ORAL_TABLET | Freq: Three times a day (TID) | ORAL | Status: DC | PRN

## 2013-04-06 NOTE — Assessment & Plan Note (Signed)
1 pound weight loss. He is trying to eat more veg.

## 2013-04-06 NOTE — Assessment & Plan Note (Signed)
Per pt report, pt is on prednisone 10 and enbrel. He is off MTX and folic acid. Sees Dr Trudie Reed in Feb.

## 2013-04-06 NOTE — Patient Instructions (Addendum)
1. Please see me in 3 months 2. I called in your Xanax for total 6 months 3. I gave you three paper copies for your pain pills 4. I will mail you your blood results 5. We will schedule a breathing test once weather warmer

## 2013-04-06 NOTE — Assessment & Plan Note (Addendum)
He has increased his tobacco usage 2/2 stress. About 1 PPD today. He has had PFT's many years ago at job and agrees to repeat them once weather gets better. He rarely uses his albuterol MDI bc he is asymptomatic but I worry bc is likely has COPD and at risk for flares. Get PFT's and then if confirms the dx, start controller med. He has not been successful quitting smoking

## 2013-04-06 NOTE — Assessment & Plan Note (Signed)
He asked me about his low testosterone level that was discovered at Dr Trudie Reed' office. I showed him the result and explained that his obesity and opioid use are likely the cause and this is likely secondary hypogonadism rather than primary. I also explained the unknowns and concerns regarding T supplementation and encouraged him, if T supplementation were ever rec to him, to think about it long and hard since he is already at high CV dx risk.   Need to discuss colonoscopy screening next visit - did talk to [facility] study investigator today. Too much else today and going to see Optho and Gen Surgery for symptomatic hernia.

## 2013-04-06 NOTE — Assessment & Plan Note (Signed)
He states that normally, 3 pills a day control his pain. But certain days - cold weather, after increased hand usage, his hand stiffness and pain increases. The hydrocodone helps the pain but not the stiffness. He cont to work with Dr Trudie Reed to treat his RA. He requests a slight increase in his opioid quantity. I have never had concerns about misuse / abuse so will increase from 90 per month to 100.   He stopped the gabapentin a few months ago when he was having dizziness. The dizziness was likely 2/2 to another cause but his pain has not increased after stopping the gaba so I am taking off med list.

## 2013-04-06 NOTE — Assessment & Plan Note (Signed)
Dr Trudie Reed, per Mr Cornelio, has been concerned about his LFT's. I showed Mr Yoss his LFT trend for the past 4 yrs and explained that his LFT's are elevated but are normal for him and are the same level as they were in 2010. He has had GI W/U and MRCP without any cause found. His MRCP and [place] also showed no steatohepatitis although I explained that due to abd obesity, he likely had some fatty liver. We discussed that the only tx for fatty liver was weight loss. He has also pretty much stopped alcohol. Dr Trudie Reed has taken Karma off MTX, I assume 2/2 increased LFT's. Mr Benzel and his brother were reassured after seeing the 4 year trend and the stability of his 4 yr trend. I am checking LFT's today as the last set checked at [facility], early Dec, showed an ever so slight increase.

## 2013-04-06 NOTE — Progress Notes (Signed)
   Subjective:    Patient ID: Andrew Fowler, male    DOB: 05-30-1960, 53 y.o.   MRN: [id number]  HPI  Please see the A&P for the status of the pt's chronic medical problems.   Review of Systems  Constitutional: Negative for activity change and unexpected weight change.  HENT: Negative for rhinorrhea.   Eyes: Negative for itching.  Respiratory: Positive for cough. Negative for shortness of breath.   Cardiovascular: Negative for chest pain.  Gastrointestinal: Positive for abdominal pain.  Genitourinary: Negative for difficulty urinating.  Musculoskeletal: Positive for arthralgias and gait problem.  Skin: Negative for rash.  Neurological: Negative for dizziness, light-headedness and headaches.  Psychiatric/Behavioral: Positive for sleep disturbance. Negative for self-injury. The patient is not nervous/anxious.        Objective:   Physical Exam  Constitutional: He is oriented to person, place, and time. He appears well-developed and well-nourished. No distress.  Obese  HENT:  Head: Normocephalic and atraumatic.  Right Ear: External ear normal.  Left Ear: External ear normal.  Nose: Nose normal.  Eyes: Conjunctivae and EOM are normal.  Cardiovascular: Normal rate, regular rhythm and normal heart sounds.   Pulmonary/Chest: Effort normal. No respiratory distress. He has wheezes.  Audible breathing. Poor air movement.   Abdominal:  Increased ABD girth  Neurological: He is alert and oriented to person, place, and time.  Skin: Skin is warm and dry. He is not diaphoretic.  Psychiatric: He has a normal mood and affect. His behavior is normal. Judgment and thought content normal.          Assessment & Plan:

## 2013-04-06 NOTE — Assessment & Plan Note (Addendum)
He volunteered to decrease quantity from 75 to 60 bc several months he has pills left over. A lot of his anxiety is housing related and he is talking to [place] today about options.   He used to be on Prozac but thinks he stopped it about 2 yrs ago be he was no longer having suicidal thoughts and was in a better place mentally. Since he is willing to decrease benzo on his own, I didn't press to restart SSRI.

## 2013-04-06 NOTE — Assessment & Plan Note (Signed)
Still on his Zocor 40. Last LDL 8 months ago was well controlled.

## 2013-04-06 NOTE — Progress Notes (Signed)
Xanax rx called to [facility].

## 2013-04-06 NOTE — Progress Notes (Signed)
Mr. Cleckler was referred to CSW for housing options.  Pt currently living with brother, both 26+ years old and combine income for rent.  Mr. Klarich states they would be able to afford approx $600 in rent with utilities.  CSW utilized [employer] to find available housing, w/c accessible and provided to Mr. Earnhart along with information on [employer].

## 2013-04-06 NOTE — Assessment & Plan Note (Signed)
Will need to try for CPAP machine late 2015 when insurance will pay

## 2013-04-06 NOTE — Assessment & Plan Note (Signed)
BP again is low. He is symptomatic. He has BP cuff and checks it about two times weekly - most were in 120's / 80's. One was 153/99. Since asymptomatic, will leave ACEI but that can be stopped as I cannot locate any increased microalb level.

## 2013-04-06 NOTE — Assessment & Plan Note (Addendum)
He was on metformin 1000 BID. A1C increased from 6.4 to 8.2 so Dr Owens Shark added gluburide 5 mg QD and today, A1C is 7. He has had no lows and no side effects and the dizziness resolved after starting the med. Cont metformin and glyburide. He has optho appt.

## 2013-04-07 NOTE — Addendum Note (Signed)
Addended by: Hulan Fray on: 04/07/2013 07:20 PM   Modules accepted: Orders

## 2013-04-09 ENCOUNTER — Encounter: Payer: Self-pay | Admitting: Internal Medicine

## 2013-04-15 ENCOUNTER — Encounter: Payer: Self-pay | Admitting: Internal Medicine

## 2013-05-17 ENCOUNTER — Other Ambulatory Visit (HOSPITAL_COMMUNITY): Payer: Self-pay | Admitting: Internal Medicine

## 2013-05-21 ENCOUNTER — Other Ambulatory Visit: Payer: Medicaid Other

## 2013-05-21 DIAGNOSIS — Z1211 Encounter for screening for malignant neoplasm of colon: Secondary | ICD-10-CM

## 2013-05-21 LAB — POC HEMOCCULT BLD/STL (HOME/3-CARD/SCREEN)
Card #3 Fecal Occult Blood, POC: NEGATIVE
FECAL OCCULT BLD: NEGATIVE
FECAL OCCULT BLD: NEGATIVE

## 2013-06-15 ENCOUNTER — Encounter: Payer: Self-pay | Admitting: Internal Medicine

## 2013-06-15 ENCOUNTER — Ambulatory Visit (INDEPENDENT_AMBULATORY_CARE_PROVIDER_SITE_OTHER): Payer: Medicaid Other | Admitting: Internal Medicine

## 2013-06-15 VITALS — BP 138/89 | HR 100 | Temp 97.3°F | Ht 71.0 in | Wt 339.6 lb

## 2013-06-15 DIAGNOSIS — J449 Chronic obstructive pulmonary disease, unspecified: Secondary | ICD-10-CM

## 2013-06-15 DIAGNOSIS — I10 Essential (primary) hypertension: Secondary | ICD-10-CM

## 2013-06-15 DIAGNOSIS — F17200 Nicotine dependence, unspecified, uncomplicated: Secondary | ICD-10-CM

## 2013-06-15 DIAGNOSIS — E119 Type 2 diabetes mellitus without complications: Secondary | ICD-10-CM

## 2013-06-15 DIAGNOSIS — E785 Hyperlipidemia, unspecified: Secondary | ICD-10-CM

## 2013-06-15 DIAGNOSIS — Z0000 Encounter for general adult medical examination without abnormal findings: Secondary | ICD-10-CM

## 2013-06-15 DIAGNOSIS — R7989 Other specified abnormal findings of blood chemistry: Secondary | ICD-10-CM

## 2013-06-15 DIAGNOSIS — G894 Chronic pain syndrome: Secondary | ICD-10-CM

## 2013-06-15 DIAGNOSIS — F341 Dysthymic disorder: Secondary | ICD-10-CM

## 2013-06-15 DIAGNOSIS — R232 Flushing: Secondary | ICD-10-CM

## 2013-06-15 DIAGNOSIS — R945 Abnormal results of liver function studies: Secondary | ICD-10-CM

## 2013-06-15 DIAGNOSIS — F418 Other specified anxiety disorders: Secondary | ICD-10-CM

## 2013-06-15 DIAGNOSIS — M069 Rheumatoid arthritis, unspecified: Secondary | ICD-10-CM

## 2013-06-15 LAB — COMPLETE METABOLIC PANEL WITH GFR
ALK PHOS: 167 U/L — AB (ref 39–117)
ALT: 131 U/L — AB (ref 0–53)
AST: 57 U/L — AB (ref 0–37)
Albumin: 4 g/dL (ref 3.5–5.2)
BUN: 17 mg/dL (ref 6–23)
CHLORIDE: 96 meq/L (ref 96–112)
CO2: 28 mEq/L (ref 19–32)
CREATININE: 0.84 mg/dL (ref 0.50–1.35)
Calcium: 10.6 mg/dL — ABNORMAL HIGH (ref 8.4–10.5)
GFR, Est African American: >89 mL/min
GFR, Est Non African American: >89 mL/min
Glucose, Bld: 169 mg/dL — ABNORMAL HIGH (ref 70–99)
Potassium: 4 mEq/L (ref 3.5–5.3)
Sodium: 134 mEq/L — ABNORMAL LOW (ref 135–145)
Total Bilirubin: 0.7 mg/dL (ref 0.2–1.2)
Total Protein: 6.7 g/dL (ref 6.0–8.3)

## 2013-06-15 MED ORDER — OMEPRAZOLE 20 MG PO CPDR: 20.0000 mg | DELAYED_RELEASE_CAPSULE | Freq: Every day | ORAL | Status: DC

## 2013-06-15 MED ORDER — HYDROCODONE-ACETAMINOPHEN 10-325 MG PO TABS: 1.0000 | ORAL_TABLET | Freq: Four times a day (QID) | ORAL | Status: DC | PRN

## 2013-06-15 NOTE — Assessment & Plan Note (Addendum)
He is trying to eat healthy and has lost 7 lbs. He is also trying to walk more which is easier even with the slight weight loss.

## 2013-06-15 NOTE — Assessment & Plan Note (Signed)
He is doing well on the #60 Xanax. His has been very willing to taper down slowly. I am going to hold at this level for now since he is having med issues that are causing him anxiety.

## 2013-06-15 NOTE — Assessment & Plan Note (Addendum)
Managed by Dr Trudie Reed. He remains on Prednisone 10. On Enbrel. Was on MTX but was taken off although pt states he is supposed to resume it next month. His hands are the area that mostly hurt. His shoulder now hurts less bc he started injecting into the arm fat area rather than muscle.

## 2013-06-15 NOTE — Assessment & Plan Note (Addendum)
The way the hydrocodone Rx was written, he was only getting #100 per 33 days. He usually takes 3 per day, but on bad days takes 4. I have no concern for abuse / mis use. Will change Rx for QID but pt and I agree that #100 will last 30 days.   He has c/o hot flashes for some time. Opioids can cause hot flashes as can obesity. Also androgen def can cause this and he was told his Testosterone was low. He has no wt loss, night sweats, other alarming sxs. Other differentials to consider include :  Mastocytosis - can increase LFT's and cause flushing along with diffuse pain and depression / anxiety - all of which he has. He will need to return for skin exam. Check Tryptase level today. Carcinoid - really doesn't have any other sxs such as D. Will not W/U for now. Pheo - no HTN, HA, sweating. Will not W/U for now.  VIPoma - no sxs. Hyperthyroidism - TSH nl in 20123. May repeat.

## 2013-06-15 NOTE — Assessment & Plan Note (Signed)
See A&P for COPD.

## 2013-06-15 NOTE — Patient Instructions (Signed)
1. See me in three months 2. Try the stomach medicine 3. I will work on getting you to a stomach doctor - liver, colon cancer screening, and acid reflux 4. I will schedule the breathing test  5. I gave you three copies of your pain pill prescription - give all three to the pharmacy for safe keeping

## 2013-06-15 NOTE — Assessment & Plan Note (Signed)
See overview. I am repeating LFt's today. Reviewed levels from 05/31/13 from Dr Trudie Reed' office - Alk phos 225, AST 62, ALT 138. A bit higher than previously levels but still within his range. Checking TTG today and Tryptase today as both can cause increased LFT's. By pt report, Dr Trudie Reed is limiting pt tx by LFT's and this is causing pt sig anxiety, I will refer back to [facility] [place], Dr Carlean Purl. The question for GI - Is there any additional W/U needed?

## 2013-06-15 NOTE — Assessment & Plan Note (Signed)
Cont Zocor.

## 2013-06-15 NOTE — Assessment & Plan Note (Signed)
I had been concerned bc he was low last visit. He BP is nl now. Cont ACEi - Lisinopril 92m.

## 2013-06-15 NOTE — Assessment & Plan Note (Signed)
The d/o COPD is just clinical. He might have had PFT's in past but have never been able to locate them. He is willing to get PFT's so order them.   He smokes 1 PPD, sometimes more. He does want to quit smoking but is not yet ready to quit. We discuss Varenincline and he has heard of Chantix - saw it on TV - and is willing to try it. He knows side effects of suicide and willing to have that possibility. Since he is having GI issues, will not start yet - wait until more stable.

## 2013-06-15 NOTE — Progress Notes (Signed)
   Subjective:    Patient ID: Andrew Fowler, male    DOB: May 11, 1960, 53 y.o.   MRN: [id number]  HPI  Please see the A&P for the status of the pt's chronic medical problems.   Review of Systems  Constitutional:       Intentional weight loss 7 lbs  Respiratory: Negative for shortness of breath.   Gastrointestinal: Positive for abdominal pain.  Musculoskeletal: Positive for arthralgias.  Skin: Negative for rash.  Neurological: Negative for dizziness and light-headedness.       Objective:   Physical Exam  Constitutional: He is oriented to person, place, and time. He appears well-developed and well-nourished. No distress.  HENT:  Head: Normocephalic and atraumatic.  Right Ear: External ear normal.  Left Ear: External ear normal.  Pulmonary/Chest: Effort normal.  Appears slightly dyspneic. No overt resp distress.  Musculoskeletal:  Wheelchair  Neurological: He is alert and oriented to person, place, and time.  Skin: Skin is warm and dry. He is not diaphoretic.  Psychiatric: He has a normal mood and affect. His behavior is normal. Judgment and thought content normal.          Assessment & Plan:

## 2013-06-15 NOTE — Assessment & Plan Note (Addendum)
Andrew Fowler brought up colonoscopy so we finally had a chance to discuss. He is interested in screening. Will send to GI for eval.   He describes 3.5 weeks of RUQ pain. Not assoc with meals. He has also increased use of antacids. Noticing increased burping and sour taste in mouth. Will Rx PPI for 6 - 8 weeks only and then stop it.

## 2013-06-15 NOTE — Assessment & Plan Note (Signed)
He is on Metformin 1000 BID and Glyburide 5 QD. No lows. Cont current meds as well controlled.  Lab Results  Component Value Date   HGBA1C 7.0 04/06/2013   HGBA1C 8.2 12/28/2012   HGBA1C 6.4 08/18/2012

## 2013-06-16 ENCOUNTER — Encounter: Payer: Self-pay | Admitting: Internal Medicine

## 2013-06-16 LAB — TISSUE TRANSGLUTAMINASE, IGG: TISSUE TRANSGLUT AB: 8.8 U/mL (ref <20)

## 2013-06-18 LAB — TRYPTASE: TRYPTASE: 11.6 ug/L — AB (ref <11)

## 2013-06-25 ENCOUNTER — Encounter (HOSPITAL_COMMUNITY): Payer: Medicaid Other

## 2013-06-29 ENCOUNTER — Ambulatory Visit (INDEPENDENT_AMBULATORY_CARE_PROVIDER_SITE_OTHER): Payer: Medicaid Other | Admitting: Internal Medicine

## 2013-06-29 ENCOUNTER — Encounter: Payer: Self-pay | Admitting: Internal Medicine

## 2013-06-29 VITALS — BP 118/85 | HR 105 | Temp 98.4°F | Ht 70.4 in | Wt 341.1 lb

## 2013-06-29 DIAGNOSIS — F17200 Nicotine dependence, unspecified, uncomplicated: Secondary | ICD-10-CM

## 2013-06-29 DIAGNOSIS — I10 Essential (primary) hypertension: Secondary | ICD-10-CM

## 2013-06-29 DIAGNOSIS — J449 Chronic obstructive pulmonary disease, unspecified: Secondary | ICD-10-CM

## 2013-06-29 DIAGNOSIS — E119 Type 2 diabetes mellitus without complications: Secondary | ICD-10-CM

## 2013-06-29 DIAGNOSIS — J4489 Other specified chronic obstructive pulmonary disease: Secondary | ICD-10-CM

## 2013-06-29 DIAGNOSIS — R7989 Other specified abnormal findings of blood chemistry: Secondary | ICD-10-CM

## 2013-06-29 DIAGNOSIS — R945 Abnormal results of liver function studies: Secondary | ICD-10-CM

## 2013-06-29 DIAGNOSIS — G894 Chronic pain syndrome: Secondary | ICD-10-CM

## 2013-06-29 LAB — GLUCOSE, CAPILLARY: Glucose-Capillary: 199 mg/dL — ABNORMAL HIGH (ref 70–99)

## 2013-06-29 LAB — POCT GLYCOSYLATED HEMOGLOBIN (HGB A1C): Hemoglobin A1C: 6

## 2013-06-29 NOTE — Progress Notes (Signed)
Case discussed with Dr. Aundra Dubin at time of visit.  We reviewed the resident's history and exam and pertinent patient test results.  I agree with the assessment, diagnosis, and plan of care documented in the resident's note.

## 2013-06-29 NOTE — Assessment & Plan Note (Addendum)
Most recent lfts AST 57, ALT 131 05/2013.  His lfts are chronically elevated and he wants to know why.  This could be related to fatty liver noted on [place] 05/2008.  Less likely mastocytosis.  He denies drinking excessive alcohol in years.  He is on narcotics for chronic pain.  is previous Hepatitis serologist have been negative except for Hep B Ab + the repeat serologist neg., HIV is negative. His tsh was normal 03/2011.  He denies additional OTC Tylenol use.  TG were 288 07/2012.  He has f/u 08/06/13 with GI to check his "liver, stomach and intestines".  He wants to make sure the GI doctor will follow up on his elevated lfts so see why as well.  He asks if fatty liver is reversible and I am not sure if it is or not.  I advised to disc with GI but could try exercise and diet modifications to try to lose weight.  Also medications like Glyburide, Zocor, narcotics with Tylenol, MTX, Relafon can cause elevated lfts.

## 2013-06-29 NOTE — Patient Instructions (Addendum)
General Instructions: Please follow up with Dr. Lynnae January in 2 months Please try to bring all your medicines next time. This helps [place] take good care of you and stops mistakes from medicines that could hurt you.  Treatment Goals:  Goals (1 Years of Data) as of 06/29/13         As of Today 06/15/13 04/06/13 12/28/12 11/03/12     Blood Pressure    . Blood Pressure < 140/90  118/85 138/89 97/68 123/82 109/75     Lifestyle    . Prevent Falls          . Quit smoking / using tobacco           Result Component    . HEMOGLOBIN A1C < 7.0  6.0  7.0 8.2     . LDL CALC < 100            Progress Toward Treatment Goals:  Treatment Goal 06/29/2013  Hemoglobin A1C at goal  Blood pressure at goal  Stop smoking smoking the same amount  Prevent falls -    Self Care Goals & Plans:  Self Care Goal 06/29/2013  Manage my medications take my medicines as prescribed; bring my medications to every visit; refill my medications on time  Monitor my health keep track of my blood glucose; bring my glucose meter and log to each visit  Eat healthy foods drink diet soda or water instead of juice or soda; eat more vegetables; eat foods that are low in salt; eat baked foods instead of fried foods; eat fruit for snacks and desserts  Be physically active -  Stop smoking -  Meeting treatment goals maintain the current self-care plan    Home Blood Glucose Monitoring 06/29/2013  Check my blood sugar no home glucose monitoring  When to check my blood sugar N/A     Care Management & Community Referrals:  Referral 06/29/2013  Referrals made for care management support none needed  Referrals made to community resources none       Fatty Liver Fatty liver is the accumulation of fat in liver cells. It is also called hepatosteatosis or steatohepatitis. It is normal for your liver to contain some fat. If fat is more than 5 to 10% of your liver's weight, you have fatty liver.  There are often no symptoms (problems) for years  while damage is still occurring. People often learn about their fatty liver when they have medical tests for other reasons. Fat can damage your liver for years or even decades without causing problems. When it becomes severe, it can cause fatigue, weight loss, weakness, and confusion. This makes you more likely to develop more serious liver problems. The liver is the largest organ in the body. It does a lot of work and often gives no warning signs when it is sick until late in a disease. The liver has many important jobs including:  Breaking down foods.  Storing vitamins, iron, and other minerals.  Making proteins.  Making bile for food digestion.  Breaking down many products including medications, alcohol and some poisons. CAUSES  There are a number of different conditions, medications, and poisons that can cause a fatty liver. Eating too many calories causes fat to build up in the liver. Not processing and breaking fats down normally may also cause this. Certain conditions, such as obesity, diabetes, and high triglycerides also cause this. Most fatty liver patients tend to be middle-aged and over weight.  Some causes of fatty liver  are:  Alcohol over consumption.  Malnutrition.  Steroid use.  Valproic acid toxicity.  Obesity.  Cushing's syndrome.  Poisons.  Tetracycline in high dosages.  Pregnancy.  Diabetes.  Hyperlipidemia.  Rapid weight loss. Some people develop fatty liver even having none of these conditions. SYMPTOMS  Fatty liver most often causes no problems. This is called asymptomatic.  It can be diagnosed with blood tests and also by a liver biopsy.  It is one of the most common causes of minor elevations of liver enzymes on routine blood tests.  Specialized Imaging of the liver using ultrasound, CT (computed tomography) scan, or MRI (magnetic resonance imaging) can suggest a fatty liver but a biopsy is needed to confirm it.  A biopsy involves taking a  small sample of liver tissue. This is done by using a needle. It is then looked at under a microscope by a specialist. TREATMENT  It is important to treat the cause. Simple fatty liver without a medical reason may not need treatment.  Weight loss, fat restriction, and exercise in overweight patients produces inconsistent results but is worth trying.  Fatty liver due to alcohol toxicity may not improve even with stopping drinking.  Good control of diabetes may reduce fatty liver.  Lower your triglycerides through diet, medication or both.  Eat a balanced, healthy diet.  Increase your physical activity.  Get regular checkups from a liver specialist.  There are no medical or surgical treatments for a fatty liver or NASH, but improving your diet and increasing your exercise may help prevent or reverse some of the damage. PROGNOSIS  Fatty liver may cause no damage or it can lead to an inflammation of the liver. This is, called steatohepatitis. When it is linked to alcohol abuse, it is called alcoholic steatohepatitis. It often is not linked to alcohol. It is then called nonalcoholic steatohepatitis, or NASH. Over time the liver may become scarred and hardened. This condition is called cirrhosis. Cirrhosis is serious and may lead to liver failure or cancer. NASH is one of the leading causes of cirrhosis. About 10-20% of Americans have fatty liver and a smaller 2-5% has NASH. Document Released: 04/26/2005 Document Revised: 06/03/2011 Document Reviewed: 06/19/2005 [facility] Patient Information 2014 [place].  Hypertension As your heart beats, it forces blood through your arteries. This force is your blood pressure. If the pressure is too high, it is called hypertension (HTN) or high blood pressure. HTN is dangerous because you may have it and not know it. High blood pressure may mean that your heart has to work harder to pump blood. Your arteries may be narrow or stiff. The extra work puts you  at risk for heart disease, stroke, and other problems.  Blood pressure consists of two numbers, a higher number over a lower, 110/72, for example. It is stated as "110 over 72." The ideal is below 120 for the top number (systolic) and under 80 for the bottom (diastolic). Write down your blood pressure today. You should pay close attention to your blood pressure if you have certain conditions such as:  Heart failure.  Prior heart attack.  Diabetes  Chronic kidney disease.  Prior stroke.  Multiple risk factors for heart disease. To see if you have HTN, your blood pressure should be measured while you are seated with your arm held at the level of the heart. It should be measured at least twice. A one-time elevated blood pressure reading (especially in the Emergency Department) does not mean that you need  treatment. There may be conditions in which the blood pressure is different between your right and left arms. It is important to see your caregiver soon for a recheck. Most people have essential hypertension which means that there is not a specific cause. This type of high blood pressure may be lowered by changing lifestyle factors such as:  Stress.  Smoking.  Lack of exercise.  Excessive weight.  Drug/tobacco/alcohol use.  Eating less salt. Most people do not have symptoms from high blood pressure until it has caused damage to the body. Effective treatment can often prevent, delay or reduce that damage. TREATMENT  When a cause has been identified, treatment for high blood pressure is directed at the cause. There are a large number of medications to treat HTN. These fall into several categories, and your caregiver will help you select the medicines that are best for you. Medications may have side effects. You should review side effects with your caregiver. If your blood pressure stays high after you have made lifestyle changes or started on medicines,   Your medication(s) may need to  be changed.  Other problems may need to be addressed.  Be certain you understand your prescriptions, and know how and when to take your medicine.  Be sure to follow up with your caregiver within the time frame advised (usually within two weeks) to have your blood pressure rechecked and to review your medications.  If you are taking more than one medicine to lower your blood pressure, make sure you know how and at what times they should be taken. Taking two medicines at the same time can result in blood pressure that is too low. SEEK IMMEDIATE MEDICAL CARE IF:  You develop a severe headache, blurred or changing vision, or confusion.  You have unusual weakness or numbness, or a faint feeling.  You have severe chest or abdominal pain, vomiting, or breathing problems. MAKE SURE YOU:   Understand these instructions.  Will watch your condition.  Will get help right away if you are not doing well or get worse. Document Released: 03/11/2005 Document Revised: 06/03/2011 Document Reviewed: 10/30/2007 [facility] Patient Information 2014 [place].  Type 2 Diabetes Mellitus, Adult Type 2 diabetes mellitus is a long-term (chronic) disease. In type 2 diabetes:  The pancreas does not make enough of a hormone called insulin.  The cells in the body do not respond as well to the insulin that is made.  Both of the above can happen. Normally, insulin moves sugars from food into tissue cells. This gives you energy. If you have type 2 diabetes, sugars cannot be moved into tissue cells. This causes high blood sugar (hyperglycemia).  HOME CARE  Have your hemoglobin A1c level checked twice a year. The level shows if your diabetes is under control or out of control.  Perform daily blood sugar testing as told by your doctor.  Check your ketone levels by testing your pee (urine) when you are sick and as told.  Take your diabetes or insulin medicine as told by your doctor.  Never run out of  insulin.  Adjust how much insulin you give yourself based on how many carbs (carbohydrates) you eat. Carbs are in many foods, such as fruits, vegetables, whole grains, and dairy products.  Have a healthy snack between every healthy meal. Have 3 meals and 3 snacks a day.  Lose weight if you are overweight.  Carry a medical alert card or wear your medical alert jewelry.  Carry a 15 gram  carb snack with you at all times. Examples include:  Glucose pills, 3 or 4.  Glucose gel, 15 gram tube.  Raisins, 2 tablespoons (24 grams).  Jelly beans, 6.  Animal crackers, 8.  Sugar pop, 4 ounces (120 milliliters).  Gummy treats, 9.  Notice low blood sugar (hypoglycemia) symptoms, such as:  Shaking (tremors).  Decreased ability to think clearly.  Sweating.  Increased heart rate.  Headache.  Dry mouth.  Hunger.  Crabbiness (irritability).  Being worried or tense (anxiety).  Restless sleep.  A change in speech or coordination.  Confusion.  Treat low blood sugar right away. If you are alert and can swallow, follow the 15:15 rule:  Take 15 20 grams of a rapid-acting glucose or carb. This includes glucose gel, glucose pills, or 4 ounces (120 milliliters) of fruit juice, regular pop, or low-fat milk.  Check your blood sugar level after taking the glucose.  Take 15 20 grams of more glucose if the repeat blood sugar level is still 70 mg/dL (milligrams/deciliter) or below.  Eat a meal or snack within 1 hour of the blood sugar levels going back to normal.  Notice early symptoms of high blood sugar, such as:  Being really thirsty or drinking a lot (polydipsia).  Peeing (urinating) a lot (polyuria).  Do at least 150 minutes of physical activity a week or as told.  Split the 150 minutes of activity up during the week. Do not do 150 minutes of activity in one day.  Perform exercises, such as weight lifting, at least 2 times a week or as told.  Adjust your insulin or food  intake as needed if you start a new exercise or sport.  Follow your sick day plan when you are not able to eat or drink as usual.  Avoid tobacco use.  Women who are not pregnant should drink no more than 1 drink a day. Men should drink no more than 2 drinks a day.  Only drink alcohol with food.  Ask your doctor if alcohol is safe for you.  Tell your doctor if you drink alcohol several times during the week.  See your doctor regularly.  Schedule an eye exam soon after you are diagnosed with diabetes. Schedule exams once every year.  Check your skin and feet every day. Check for cuts, bruises, redness, nail problems, bleeding, blisters, or sores. A doctor should do a foot exam once a year.  Brush your teeth and gums twice a day. Floss once a day. Visit your dentist regularly.  Share your diabetes plan with your workplace or school.  Stay up-to-date with shots that fight against diseases (immunizations).  Learn how to manage stress.  Get diabetes education and support as needed.  Ask your doctor for special help if:  You need help to maintain or improve how you to do things on your own.  You need help to maintain or improve the quality of your life.  You have foot or hand problems.  You have trouble cleaning yourself, dressing, eating, or doing physical activity. GET HELP RIGHT AWAY IF:  You have trouble breathing.  You have moderate to large ketone levels.  You are unable to eat food or drink fluids for more than 6 hours.  You feel sick to your stomach (nauseous) or throw up (vomit) for more than 6 hours.  Your blood sugar level is over 240 mg/dL.  There is a change in mental status.  You get another serious illness.  You have watery poop (  diarrhea) for more than 6 hours.  You have been sick or have had a fever for 2 or more days and are not getting better.  You have pain when you are physically active. MAKE SURE YOU:  Understand these instructions.  Will  watch your condition.  Will get help right away if you are not doing well or get worse. Document Released: 12/19/2007 Document Revised: 12/30/2012 Document Reviewed: 07/10/2012 [facility] Patient Information 2014 [place], [place].  Smoking Cessation Quitting smoking is important to your health and has many advantages. However, it is not always easy to quit since nicotine is a very addictive drug. Often times, people try 3 times or more before being able to quit. This document explains the best ways for you to prepare to quit smoking. Quitting takes hard work and a lot of effort, but you can do it. ADVANTAGES OF QUITTING SMOKING  You will live longer, feel better, and live better.  Your body will feel the impact of quitting smoking almost immediately.  Within 20 minutes, blood pressure decreases. Your pulse returns to its normal level.  After 8 hours, carbon monoxide levels in the blood return to normal. Your oxygen level increases.  After 24 hours, the chance of having a heart attack starts to decrease. Your breath, hair, and body stop smelling like smoke.  After 48 hours, damaged nerve endings begin to recover. Your sense of taste and smell improve.  After 72 hours, the body is virtually free of nicotine. Your bronchial tubes relax and breathing becomes easier.  After 2 to 12 weeks, lungs can hold more air. Exercise becomes easier and circulation improves.  The risk of having a heart attack, stroke, cancer, or lung disease is greatly reduced.  After 1 year, the risk of coronary heart disease is cut in half.  After 5 years, the risk of stroke falls to the same as a nonsmoker.  After 10 years, the risk of lung cancer is cut in half and the risk of other cancers decreases significantly.  After 15 years, the risk of coronary heart disease drops, usually to the level of a nonsmoker.  If you are pregnant, quitting smoking will improve your chances of having a healthy baby.  The people  you live with, especially any children, will be healthier.  You will have extra money to spend on things other than cigarettes. QUESTIONS TO THINK ABOUT BEFORE ATTEMPTING TO QUIT You may want to talk about your answers with your caregiver.  Why do you want to quit?  If you tried to quit in the past, what helped and what did not?  What will be the most difficult situations for you after you quit? How will you plan to handle them?  Who can help you through the tough times? Your family? Friends? A caregiver?  What pleasures do you get from smoking? What ways can you still get pleasure if you quit? Here are some questions to ask your caregiver:  How can you help me to be successful at quitting?  What medicine do you think would be best for me and how should I take it?  What should I do if I need more help?  What is smoking withdrawal like? How can I get information on withdrawal? GET READY  Set a quit date.  Change your environment by getting rid of all cigarettes, ashtrays, matches, and lighters in your home, car, or work. Do not let people smoke in your home.  Review your past attempts to quit.  Think about what worked and what did not. GET SUPPORT AND ENCOURAGEMENT You have a better chance of being successful if you have help. You can get support in many ways.  Tell your family, friends, and co-workers that you are going to quit and need their support. Ask them not to smoke around you.  Get individual, group, or telephone counseling and support. Programs are available at [employer] and health centers. Call your local health department for information about programs in your area.  Spiritual beliefs and practices may help some smokers quit.  Download a "quit meter" on your computer to keep track of quit statistics, such as how long you have gone without smoking, cigarettes not smoked, and money saved.  Get a self-help book about quitting smoking and staying off of  tobacco. [place] yourself from urges to smoke. Talk to someone, go for a walk, or occupy your time with a task.  Change your normal routine. Take a different route to work. Drink tea instead of coffee. Eat breakfast in a different place.  Reduce your stress. Take a hot bath, exercise, or read a book.  Plan something enjoyable to do every day. Reward yourself for not smoking.  Explore interactive web-based programs that specialize in helping you quit. GET MEDICINE AND USE IT CORRECTLY Medicines can help you stop smoking and decrease the urge to smoke. Combining medicine with the above behavioral methods and support can greatly increase your chances of successfully quitting smoking.  Nicotine replacement therapy helps deliver nicotine to your body without the negative effects and risks of smoking. Nicotine replacement therapy includes nicotine gum, lozenges, inhalers, nasal sprays, and skin patches. Some may be available over-the-counter and others require a prescription.  Antidepressant medicine helps people abstain from smoking, but how this works is unknown. This medicine is available by prescription.  Nicotinic receptor partial agonist medicine simulates the effect of nicotine in your brain. This medicine is available by prescription. Ask your caregiver for advice about which medicines to use and how to use them based on your health history. Your caregiver will tell you what side effects to look out for if you choose to be on a medicine or therapy. Carefully read the information on the package. Do not use any other product containing nicotine while using a nicotine replacement product.  RELAPSE OR DIFFICULT SITUATIONS Most relapses occur within the first 3 months after quitting. Do not be discouraged if you start smoking again. Remember, most people try several times before finally quitting. You may have symptoms of withdrawal because your body is used to  nicotine. You may crave cigarettes, be irritable, feel very hungry, cough often, get headaches, or have difficulty concentrating. The withdrawal symptoms are only temporary. They are strongest when you first quit, but they will go away within 10 14 days. To reduce the chances of relapse, try to:  Avoid drinking alcohol. Drinking lowers your chances of successfully quitting.  Reduce the amount of caffeine you consume. Once you quit smoking, the amount of caffeine in your body increases and can give you symptoms, such as a rapid heartbeat, sweating, and anxiety.  Avoid smokers because they can make you want to smoke.  Do not let weight gain distract you. Many smokers will gain weight when they quit, usually less than 10 pounds. Eat a healthy diet and stay active. You can always lose the weight gained after you quit.  Find ways to improve your mood other than smoking.  FOR MORE INFORMATION  www.smokefree.gov  Document Released: 03/05/2001 Document Revised: 09/10/2011 Document Reviewed: 06/20/2011 [facility] Patient Information 2014 [place], [place].

## 2013-06-29 NOTE — Assessment & Plan Note (Signed)
Since patient has chronically elevated lfts he will disc with PCP possibly changing pain medications to not include Acetaminophen

## 2013-06-29 NOTE — Assessment & Plan Note (Signed)
Lab Results  Component Value Date   HGBA1C 6.0 06/29/2013   HGBA1C 7.0 04/06/2013   HGBA1C 8.2 12/28/2012     Assessment: Diabetes control: good control (HgbA1C at goal) Progress toward A1C goal:  at goal Comments: none  Plan: Medications:  continue current medications Home glucose monitoring: Frequency: no home glucose monitoring Timing: N/A Instruction/counseling given: provided printed educational material Educational resources provided: brochure;other (see comments) Self management tools provided: none Other plans: f/u with PCP in 2 months

## 2013-06-29 NOTE — Assessment & Plan Note (Signed)
Wheezing on exam today but he has not used his inhalers today  Advised to use

## 2013-06-29 NOTE — Assessment & Plan Note (Signed)
  Assessment: Progress toward smoking cessation:  smoking the same amount Barriers to progress toward smoking cessation:  none Comments: none  Plan: Instruction/counseling given:  I counseled patient on the dangers of tobacco use, advised patient to stop smoking, and reviewed strategies to maximize success. Educational resources provided:  [employer] account manager) brochure Self management tools provided: none Medications to assist with smoking cessation:  patches not working pt considering Chantix  Patient agreed to the following self-care plans for smoking cessation: not disc'ed  Other plans: keep reassessing

## 2013-06-29 NOTE — Assessment & Plan Note (Signed)
BP Readings from Last 3 Encounters:  06/29/13 118/85  06/15/13 138/89  04/06/13 97/68    Lab Results  Component Value Date   NA 134* 06/15/2013   K 4.0 06/15/2013   CREATININE 0.84 06/15/2013    Assessment: Blood pressure control: controlled Progress toward BP goal:  at goal Comments: none  Plan: Medications:  continue current medications Educational resources provided: brochure;other (see comments) Self management tools provided: none Other plans: f/u in 2 months

## 2013-06-29 NOTE — Progress Notes (Signed)
Subjective:    Patient ID: Andrew Fowler, male    DOB: 07-20-1960, 53 y.o.   MRN: [id number]  HPI Comments: 53 y.o Past Medical History Hypertension (BP 118/85), Hyperlipidemia, Obesity, Tobacco abuse (still smoking 1ppd), ?COPD (no pfts), h/o Gout, Diabetes mellitus 2 (HA1C 6.0, cbg 199 today), Obstructive sleep apnea, Anxiety associated with depression, RA (rheumatoid arthritis-follows with Dr. Trudie Reed), Chronic venous insufficiency,  Hepatitis B antibody positive, chronic elevation liver function tests, Chronic pain syndrome, fatty liver (on [place] 05/2008).   1) He presents for f/u by his PCP for concern for mastocytosis with history of lfts, hot flashes, slightly increase tryptase:  -He does not have a rash today and he denies hives x 10-12 years.  He does not have RUQ ab pain but has had it in the past and [place] 04/2011 noted with contracted gallbladder w/o gallstones and splenomegaly.  Most recent lfts AST 57, ALT 131 05/2013.  His lfts are chronically elevated and he wants to know why.  He denies drinking alcohol.  He is taking Ibuprofen 1 tablet q week and his narcotic for chronic pain.  He drinks <1 beer occasionally every 2-3 weeks with his brother.  His previous Hepatitis serologist have been negative except for Hep B Ab +, HIV is negative. His tsh was normal 03/2011.  He denies OTC Tylenol use.  TG were 288 07/2012.  He has f/u 08/06/13 with GI to check his "liver, stomach and intestines".  He wants to make sure the GI doctor will follow up on his elevated lfts so see why as well.    2) RA-Dr. Trudie Reed managing on Enbrel q week.  He is taking MTX (will start MTX on 4/9; he has not been taking x 2 months) and Relafen (he thinks this is the name he is taking currently but will stop 07/01/13 to resume MTX)  SH: 1 ppd smoking (patches did not help considering Chantix). He is sexually active with oral sex only.  He recently got his own transportation      Review of Systems  Gastrointestinal: Positive for  nausea. Negative for vomiting, abdominal pain and diarrhea.       Intermittent nausea       Objective:   Physical Exam  Nursing note and vitals reviewed. Constitutional: He is oriented to person, place, and time. He appears well-developed and well-nourished. He is cooperative.  HENT:  Head: Normocephalic and atraumatic.  Mouth/Throat: Oropharynx is clear and moist and mucous membranes are normal. Abnormal dentition. No oropharyngeal exudate.  Eyes: Conjunctivae are normal. Pupils are equal, round, and reactive to light. Right eye exhibits no discharge. Left eye exhibits no discharge. No scleral icterus.  Cardiovascular: Regular rhythm, S1 normal, S2 normal and normal heart sounds.  Tachycardia present.   No murmur heard. No lower ext edema   Pulmonary/Chest: Effort normal. He has wheezes.  Abdominal: Soft. Bowel sounds are normal. He exhibits no distension. There is no tenderness.    Obese ab  Musculoskeletal: He exhibits no edema.  Neurological: He is alert and oriented to person, place, and time.  In wheelchair (electric) today  Skin: Skin is warm and dry. No rash noted.  Striae to b/l shoulders with acne scars to upper back and open and closed comedones to shoulders/upper back.   Xerosis to legs b/l  No evidence of rash   Psychiatric: He has a normal mood and affect. His speech is normal and behavior is normal. Judgment and thought content normal. Cognition and memory  are normal.          Assessment & Plan:  F/u with PCP as scheduled

## 2013-07-09 ENCOUNTER — Other Ambulatory Visit: Payer: Self-pay | Admitting: Internal Medicine

## 2013-07-09 ENCOUNTER — Telehealth: Payer: Self-pay | Admitting: *Deleted

## 2013-07-09 DIAGNOSIS — R945 Abnormal results of liver function studies: Principal | ICD-10-CM

## 2013-07-09 DIAGNOSIS — R7989 Other specified abnormal findings of blood chemistry: Secondary | ICD-10-CM

## 2013-07-09 NOTE — Telephone Encounter (Signed)
Andrew Fowler had sent me this question and I replied back to her. Basically, we were waiting until GI eval as Chantix can cause GI sxs.   Also sent message to Andrew Fowler and triage pool - pls ask him to come in for lab appt.  Thanks

## 2013-07-09 NOTE — Telephone Encounter (Signed)
Call from pt - pt states at last visit Chantix was discussed but has not heard anything else about it. Thanks

## 2013-07-09 NOTE — Progress Notes (Signed)
Scheduled for mon 4/20

## 2013-07-12 ENCOUNTER — Other Ambulatory Visit: Payer: Medicaid Other

## 2013-07-12 NOTE — Telephone Encounter (Signed)
Pt has lab appt 07/12/13.

## 2013-07-13 ENCOUNTER — Other Ambulatory Visit (INDEPENDENT_AMBULATORY_CARE_PROVIDER_SITE_OTHER): Payer: Medicaid Other

## 2013-07-13 DIAGNOSIS — R7989 Other specified abnormal findings of blood chemistry: Secondary | ICD-10-CM

## 2013-07-13 DIAGNOSIS — R945 Abnormal results of liver function studies: Secondary | ICD-10-CM

## 2013-07-14 ENCOUNTER — Encounter: Payer: Self-pay | Admitting: Internal Medicine

## 2013-07-14 LAB — ANEMIA PANEL
%SAT: 24 % (ref 20–55)
ABS Retic: 97.2 10*3/uL (ref 19.0–186.0)
Ferritin: 196 ng/mL (ref 22–322)
Folate: 11.2 ng/mL
IRON: 75 ug/dL (ref 42–165)
RBC.: 5.4 MIL/uL (ref 4.22–5.81)
Retic Ct Pct: 1.8 % (ref 0.4–2.3)
TIBC: 317 ug/dL (ref 215–435)
UIBC: 242 ug/dL (ref 125–400)
VITAMIN B 12: 549 pg/mL (ref 211–911)

## 2013-07-24 ENCOUNTER — Other Ambulatory Visit: Payer: Self-pay | Admitting: Internal Medicine

## 2013-08-06 ENCOUNTER — Ambulatory Visit: Payer: Medicaid Other | Admitting: Internal Medicine

## 2013-08-06 ENCOUNTER — Telehealth: Payer: Self-pay | Admitting: Internal Medicine

## 2013-08-23 ENCOUNTER — Other Ambulatory Visit: Payer: Self-pay | Admitting: *Deleted

## 2013-08-23 DIAGNOSIS — F418 Other specified anxiety disorders: Secondary | ICD-10-CM

## 2013-08-23 DIAGNOSIS — J449 Chronic obstructive pulmonary disease, unspecified: Secondary | ICD-10-CM

## 2013-08-23 DIAGNOSIS — E119 Type 2 diabetes mellitus without complications: Secondary | ICD-10-CM

## 2013-08-23 DIAGNOSIS — F17200 Nicotine dependence, unspecified, uncomplicated: Secondary | ICD-10-CM

## 2013-08-23 DIAGNOSIS — I10 Essential (primary) hypertension: Secondary | ICD-10-CM

## 2013-08-23 DIAGNOSIS — M069 Rheumatoid arthritis, unspecified: Secondary | ICD-10-CM

## 2013-08-24 IMAGING — US US ABDOMEN COMPLETE
1 series · 14 of 25 positions shown · non-contrast
Comparison: 06/11/2008.

CLINICAL DATA: Chest pain.  Elevated alkaline phosphatase.

COMPLETE ABDOMINAL ULTRASOUND

[Series 1: us abdomen complete · 0.41mm/px · 14 of 44 slices shown]
[im 1/44]
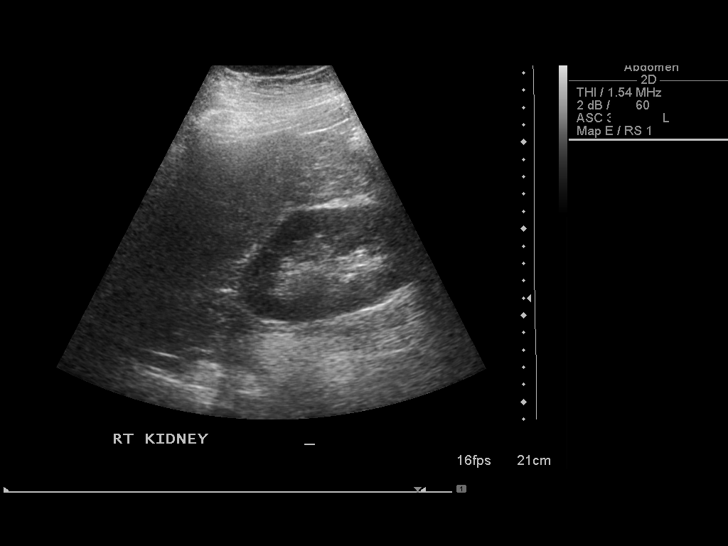
[im 4/44]
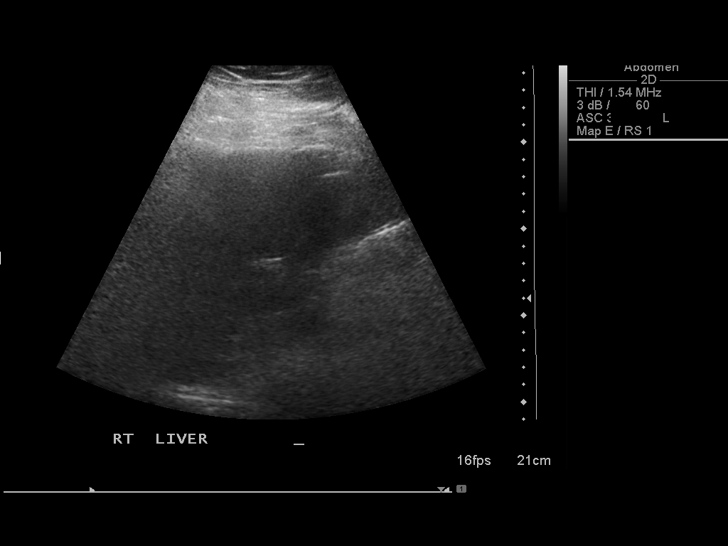
[im 8/44]
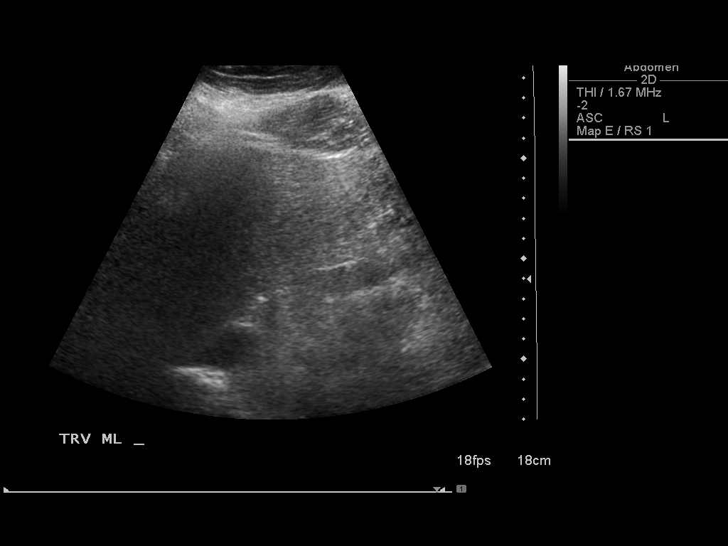
[im 11/44]
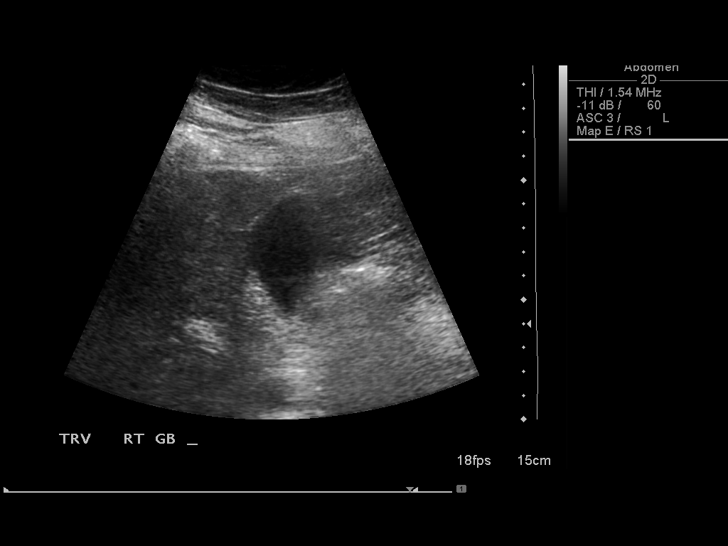
[im 15/44]
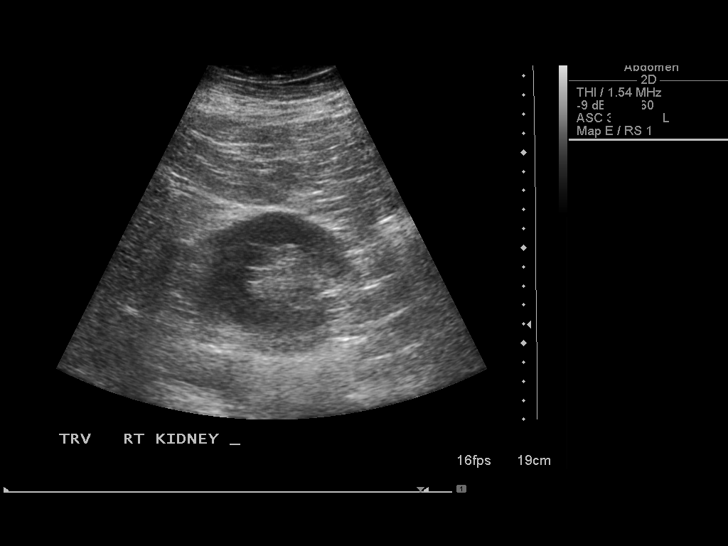
[im 17/44]
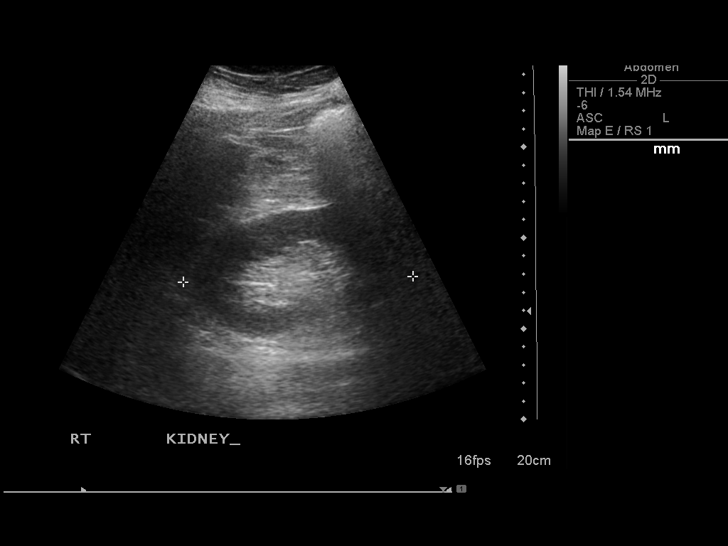
[im 20/44]
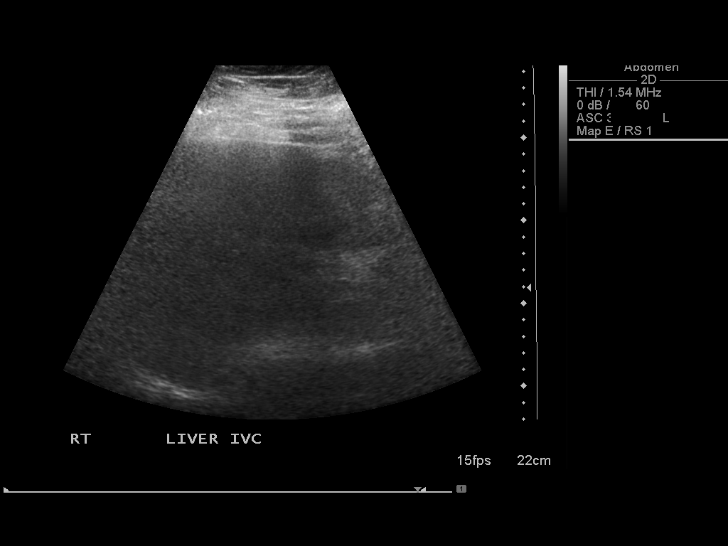
[im 24/44]
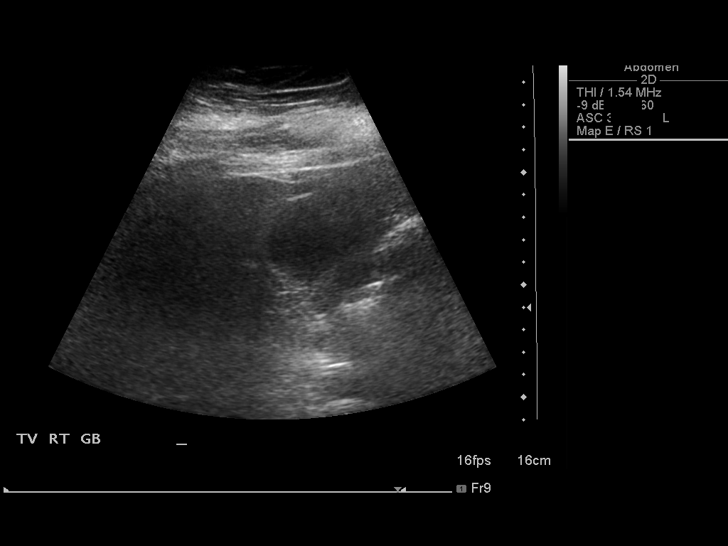
[im 27/44]
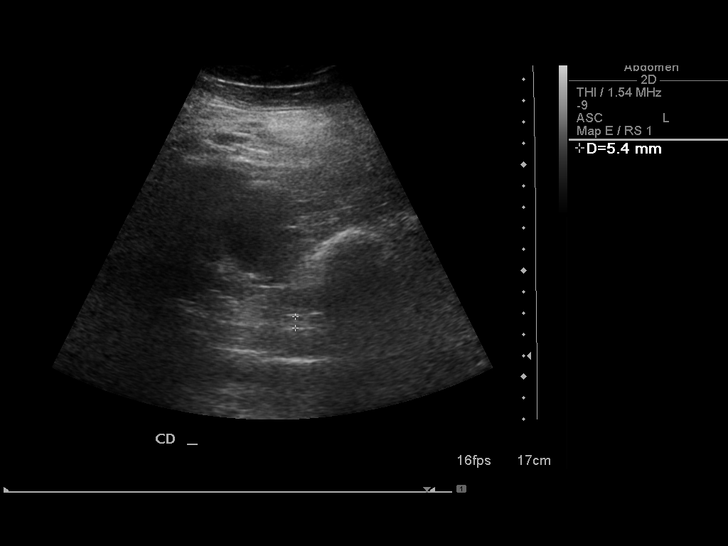
[im 29/44]
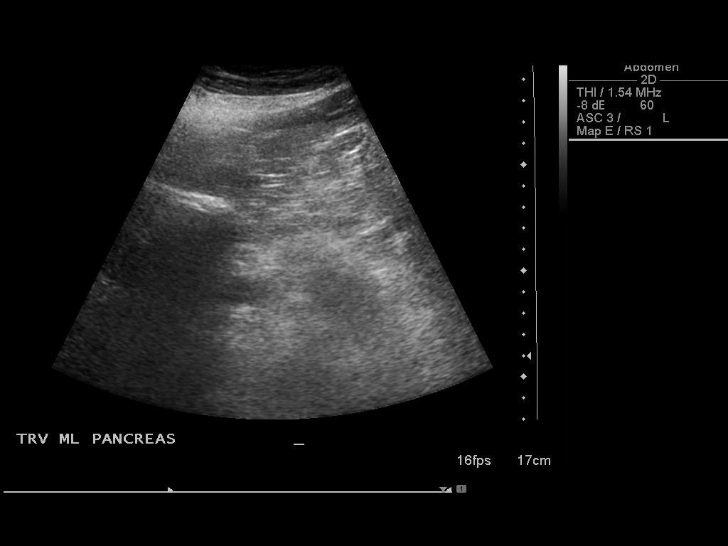
[im 33/44]
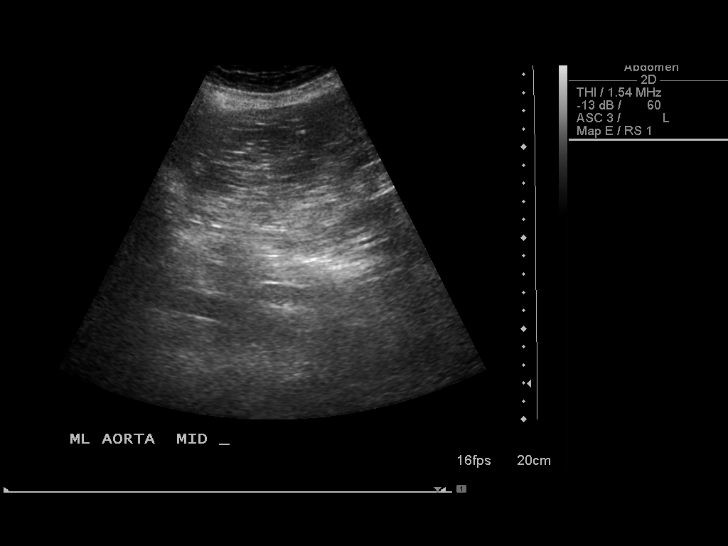
[im 36/44]
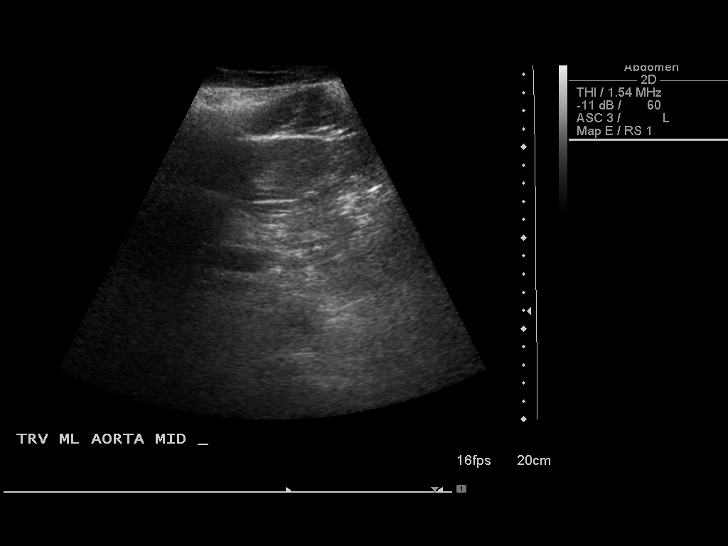
[im 40/44]
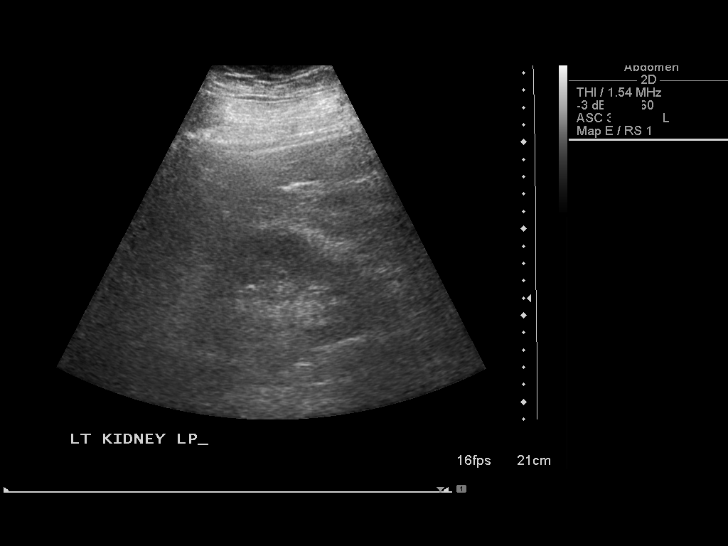
[im 44/44]
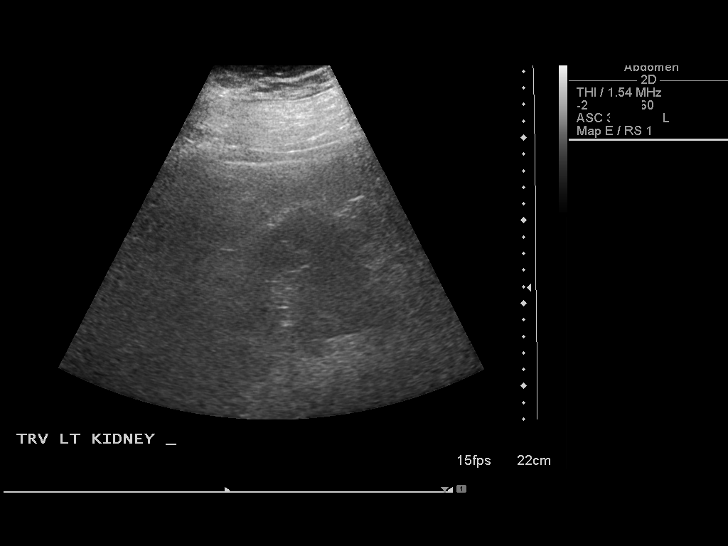

[14 of 25 positions shown; findings below may reference images not displayed]

FINDINGS: Gallbladder:  No gallstones, gallbladder wall thickening, or
pericholecystic fluid.

Common bile duct:  Normal in caliber, measuring 5.4 mm in diameter
proximally.

Liver:  Suboptimally visualized due to the large size of the
patient.  No gross abnormality.

IVC:  Not visualized.

Pancreas:  Poorly visualized.  The visualized portions appear
normal.

Spleen:  Not visualized.

Right Kidney:  Normal, measuring 12.7 cm in length.

Left Kidney:  Normal, measuring 12.0 cm in length.

Abdominal aorta:  Not visualized distally.  The visualized portions
are normal in caliber.
IMPRESSION: Limited examination due to the large size of the patient.  No
visible abnormality.

## 2013-08-24 MED ORDER — NICOTINE 21 MG/24HR TD PT24: 21.0000 mg | MEDICATED_PATCH | TRANSDERMAL | Status: DC

## 2013-08-24 NOTE — Telephone Encounter (Signed)
He last got this in 2014. Is he trying to quit and wants to retry the patches? Will you verify he is still 1PPD? Thanks

## 2013-08-24 NOTE — Telephone Encounter (Signed)
He needs to use the 21 mg for 6 weeks and then if doing well, call me so that I can then fill the lower dose for 2 weeks. Thanks

## 2013-08-24 NOTE — Telephone Encounter (Signed)
States he's trying to quit again; he's up to 1 PPD; wants to try the patches one more time.

## 2013-09-09 ENCOUNTER — Encounter: Payer: Self-pay | Admitting: *Deleted

## 2013-09-14 ENCOUNTER — Encounter: Payer: Self-pay | Admitting: Internal Medicine

## 2013-09-14 ENCOUNTER — Telehealth: Payer: Self-pay | Admitting: *Deleted

## 2013-09-30 ENCOUNTER — Ambulatory Visit (INDEPENDENT_AMBULATORY_CARE_PROVIDER_SITE_OTHER): Payer: Medicaid Other | Admitting: Internal Medicine

## 2013-09-30 ENCOUNTER — Encounter: Payer: Self-pay | Admitting: Internal Medicine

## 2013-09-30 VITALS — BP 120/81 | HR 92 | Temp 97.6°F | Wt 331.5 lb

## 2013-09-30 DIAGNOSIS — F418 Other specified anxiety disorders: Secondary | ICD-10-CM

## 2013-09-30 DIAGNOSIS — E119 Type 2 diabetes mellitus without complications: Secondary | ICD-10-CM

## 2013-09-30 DIAGNOSIS — R7989 Other specified abnormal findings of blood chemistry: Secondary | ICD-10-CM

## 2013-09-30 DIAGNOSIS — F341 Dysthymic disorder: Secondary | ICD-10-CM

## 2013-09-30 DIAGNOSIS — E785 Hyperlipidemia, unspecified: Secondary | ICD-10-CM

## 2013-09-30 DIAGNOSIS — M069 Rheumatoid arthritis, unspecified: Secondary | ICD-10-CM

## 2013-09-30 DIAGNOSIS — Z0000 Encounter for general adult medical examination without abnormal findings: Secondary | ICD-10-CM

## 2013-09-30 DIAGNOSIS — G894 Chronic pain syndrome: Secondary | ICD-10-CM

## 2013-09-30 DIAGNOSIS — G4733 Obstructive sleep apnea (adult) (pediatric): Secondary | ICD-10-CM

## 2013-09-30 DIAGNOSIS — I10 Essential (primary) hypertension: Secondary | ICD-10-CM

## 2013-09-30 DIAGNOSIS — J449 Chronic obstructive pulmonary disease, unspecified: Secondary | ICD-10-CM

## 2013-09-30 DIAGNOSIS — R945 Abnormal results of liver function studies: Secondary | ICD-10-CM

## 2013-09-30 LAB — GLUCOSE, CAPILLARY: Glucose-Capillary: 180 mg/dL — ABNORMAL HIGH (ref 70–99)

## 2013-09-30 LAB — POCT GLYCOSYLATED HEMOGLOBIN (HGB A1C): HEMOGLOBIN A1C: 7.9

## 2013-09-30 MED ORDER — HYDROCODONE-ACETAMINOPHEN 10-325 MG PO TABS: 1.0000 | ORAL_TABLET | Freq: Four times a day (QID) | ORAL | Status: DC | PRN

## 2013-09-30 MED ORDER — ALPRAZOLAM 0.5 MG PO TABS: 0.5000 mg | ORAL_TABLET | Freq: Two times a day (BID) | ORAL | Status: DC | PRN

## 2013-09-30 MED ORDER — TIOTROPIUM BROMIDE MONOHYDRATE 18 MCG IN CAPS: 18.0000 ug | ORAL_CAPSULE | Freq: Every day | RESPIRATORY_TRACT | Status: DC

## 2013-09-30 NOTE — Assessment & Plan Note (Signed)
Pain is unchanged. Still using 3 - 3.5 pills daily. Pain is worse at night. No red flags. Wanting to exercise and lose weight but pain preventing but lost 10 lbs since April!!  I filled hydrocodone and gave him three paper scripts.

## 2013-09-30 NOTE — Assessment & Plan Note (Signed)
BP Readings from Last 3 Encounters:  09/30/13 120/81  06/29/13 118/85  06/15/13 138/89    Great control on lisinopril 10. Cont

## 2013-09-30 NOTE — Assessment & Plan Note (Signed)
Lab Results  Component Value Date   HGBA1C 7.9 09/30/2013    His A1C increased from 6.0 despite weight loss and better diet. However, in past A1C bounces up and down. Will cont Metformin 1000 BID and Glyburide 5 and check in 3 months. Foot exam today. Lipid next blood draw.

## 2013-09-30 NOTE — Assessment & Plan Note (Signed)
He has lost 10 lbs since April. He has lost total of 32 lbs since max weight. Doing well!

## 2013-09-30 NOTE — Progress Notes (Signed)
   Subjective:    Patient ID: Andrew Fowler, male    DOB: [birth year], 53 y.o.   MRN: [id number]  Ankle Pain     Please see the A&P for the status of the pt's chronic medical problems.   Review of Systems  Constitutional: Negative for activity change and appetite change.       10 lb weight loss since 06/2013  HENT: Negative for rhinorrhea.   Eyes: Negative for itching.  Respiratory: Positive for cough. Negative for shortness of breath.   Cardiovascular: Negative for chest pain and leg swelling.  Gastrointestinal: Positive for vomiting, abdominal pain and constipation.  Musculoskeletal: Positive for arthralgias, back pain and gait problem.  Skin: Negative for rash and wound.  Psychiatric/Behavioral: The patient is nervous/anxious.        Objective:   Physical Exam  Constitutional: He is oriented to person, place, and time. He appears well-developed and well-nourished. No distress.  HENT:  Head: Normocephalic and atraumatic.  Right Ear: External ear normal.  Left Ear: External ear normal.  Nose: Nose normal.  Eyes: Conjunctivae and EOM are normal.  Cardiovascular: Normal rate, regular rhythm and normal heart sounds.   Pulses:      Dorsalis pedis pulses are 2+ on the right side, and 2+ on the left side.  Pulmonary/Chest: Effort normal and breath sounds normal.  Good air flow but end exp wheezing through out and B  Musculoskeletal: Normal range of motion. He exhibits tenderness. He exhibits no edema.  Callasus on L foot - plantar surface of MCP, heel and lateral malleolus.  Neurological: He is alert and oriented to person, place, and time.  Skin: Skin is warm and dry. He is not diaphoretic.  Psychiatric: He has a normal mood and affect. His behavior is normal. Judgment and thought content normal.          Assessment & Plan:

## 2013-09-30 NOTE — Assessment & Plan Note (Signed)
Needs FLP but no other blood draws needed today so will next blood draw.

## 2013-09-30 NOTE — Assessment & Plan Note (Signed)
For colon with Dr Carlean Purl. Discuss vaccines next visit.

## 2013-09-30 NOTE — Assessment & Plan Note (Signed)
Does not have CPAP machine and cannot get until later in yr.

## 2013-09-30 NOTE — Assessment & Plan Note (Signed)
He has never gotten his PFT's. Today's and other past exam show wheezing. He cont to smoke - 1 PPD - is trying to quite. Nicotine patches not helping an I want to wait on Chantix until Gi appt as it can cause side effects. Since I do think he does have COPD despite no objective evidence and it is not controlled, I will start Spiriva. We watched a video on the proper usage of the inhaler.

## 2013-09-30 NOTE — Assessment & Plan Note (Signed)
On xanax 60 per month. He likes this quantity - we had been slowly tapering down. Takes one at bed and one if going out or gets flustered. No further titration down this summer - will leave at 60 and reassess in fall.

## 2013-09-30 NOTE — Assessment & Plan Note (Signed)
He has an appt with Dr Carlean Purl later this month.

## 2013-09-30 NOTE — Assessment & Plan Note (Signed)
Off MTX. On prednisone 10. Dr Veleta Miners looking into Remicaide. Still with pain and stiffness. Had a lot of questions about length of tx and side effects Remicaide. I answered the best I could.

## 2013-09-30 NOTE — Patient Instructions (Addendum)
1.Refill Prilosec and try for 6 weeks to see if stomach and sour taste in mouth gets better. 2. I gave you 3 paper prescriptions for your pain pills 3. I gave you a paper prescription for your Xanax with refills 4. Try the new inhaler and let me know if you have any issues 5. See me 3-4 months

## 2013-10-01 ENCOUNTER — Other Ambulatory Visit: Payer: Self-pay | Admitting: Internal Medicine

## 2013-10-12 ENCOUNTER — Ambulatory Visit (INDEPENDENT_AMBULATORY_CARE_PROVIDER_SITE_OTHER): Payer: Medicaid Other | Admitting: Internal Medicine

## 2013-10-12 ENCOUNTER — Encounter: Payer: Self-pay | Admitting: Internal Medicine

## 2013-10-12 VITALS — BP 100/60 | HR 112 | Ht 70.0 in | Wt 329.0 lb

## 2013-10-12 DIAGNOSIS — R7989 Other specified abnormal findings of blood chemistry: Secondary | ICD-10-CM

## 2013-10-12 DIAGNOSIS — R945 Abnormal results of liver function studies: Principal | ICD-10-CM

## 2013-10-12 NOTE — Patient Instructions (Signed)
Good to meet you today. As discussed we will arrange for you to have a liver biopsy. You will be contacted about the appointment.  I appreciate the opportunity to care for you.  Gatha Mayer, MD, [facility]

## 2013-10-12 NOTE — Assessment & Plan Note (Addendum)
Pt aware of issue but does not seem ready to change diet

## 2013-10-12 NOTE — Progress Notes (Signed)
   Subjective:    Patient ID: Andrew Fowler, male    DOB: 03-Mar-1961, 53 y.o.   MRN: [id number]  HPI The patient is here, with his brother. He is a morbidly obese man with diabetes mellitus and metabolic syndrome with a fluctuating transaminase levels for a number of years. Extensive workup within the EMR is reviewed, there is no evidence of infectious hepatitis, he was a previously heavy user of alcohol but stopped in the last year. All other serologic studies negative/unrevealing re: cause of liver dz. Previous imaging with unenhanced MR ultrasound has not shown fatty liver the less than suspected. He is taking methotrexate though not long-term for rheumatoid arthritis and is off that now. There have been concerned about using certain medications in him because of his underlying abnormal liver tests so they were not prescribed, he tells me. His main issue now ready his immobility because of pain in his joints with rheumatoid arthritis, particularly the hands limit him from exercising.  Medications, allergies, past medical history, past surgical history, family history and social history are reviewed and updated in the EMR.  Review of Systems As per history of present illness. All other review of systems are negative. Note that his diabetes mellitus is not well controlled hemoglobin A1c is 7.9%.    Objective:   Physical Exam General:  Well-developed, well-nourished and in no acute distress - morbidly obese Eyes:  anicteric. ENT:   Mouth and posterior pharynx free of mucosal lesions - multiple damaged teeth Neck:   supple w/o thyromegaly or mass.  Lungs: Clear to auscultation bilaterally. Heart:  S1S2, no rubs, murmurs, gallops. Abdomen:  obese, soft, non-tender, no hepatosplenomegaly, + ventral hernia RLQhernia, or mass and BS+.  Lymph:  no cervical or supraclavicular adenopathy. Extremities:   no edema + RA changes MCP joints (moderate) and wrists (mild)  Skin   no rash.? Spider angiomata  upper trunk, + palmar erythema Neuro:  A&O x 3.  Psych:  appropriate mood and  Affect.   Data Reviewed: Labs, PCP notes, imaging in EMR       Assessment & Plan:  Elevated liver function tests Chronic issue, unclear etiology though fatty liver seems likely given obesity and metabolic syndrome and hx EtOH. Liver bx is appropriate given unclear dx and RA issues, Tx with certain medications that are cleared by liver. Doubt MTX use significant here Risks discussed   Obesity, Class III, BMI 40-49.9 (morbid obesity) Pt aware of issue but does not seem ready to change diet   Eventually needs a screening colonoscopy but is currently heme negative and liver issue/bx need first  I appreciate the opportunity to care for this patient.  UP:[id number],[id number], MD

## 2013-10-12 NOTE — Assessment & Plan Note (Addendum)
Chronic issue, unclear etiology though fatty liver seems likely given obesity and metabolic syndrome and hx EtOH. Liver bx is appropriate given unclear dx and RA issues, Tx with certain medications that are cleared by liver. Doubt MTX use significant here Risks discussed

## 2013-10-19 ENCOUNTER — Encounter (HOSPITAL_COMMUNITY): Payer: Self-pay | Admitting: Pharmacy Technician

## 2013-10-20 ENCOUNTER — Other Ambulatory Visit: Payer: Self-pay | Admitting: Radiology

## 2013-10-21 ENCOUNTER — Telehealth: Payer: Self-pay | Admitting: Internal Medicine

## 2013-10-21 NOTE — Telephone Encounter (Signed)
Patient is provided the number to [facility] scheduling.  He is asked to call and reschedule with them.

## 2013-10-22 ENCOUNTER — Ambulatory Visit (HOSPITAL_COMMUNITY): Admission: RE | Admit: 2013-10-22 | Payer: Medicaid Other | Source: Ambulatory Visit

## 2013-10-24 ENCOUNTER — Other Ambulatory Visit: Payer: Self-pay | Admitting: Radiology

## 2013-10-25 ENCOUNTER — Other Ambulatory Visit: Payer: Self-pay | Admitting: Radiology

## 2013-10-27 ENCOUNTER — Ambulatory Visit (HOSPITAL_COMMUNITY)
Admission: RE | Admit: 2013-10-27 | Discharge: 2013-10-27 | Disposition: A | Discharge status: Home / Self Care | Payer: Medicaid Other | Source: Ambulatory Visit | Attending: Internal Medicine | Admitting: Internal Medicine

## 2013-10-27 VITALS — BP 112/61 | HR 106 | Temp 97.9°F | Resp 20

## 2013-10-27 DIAGNOSIS — M069 Rheumatoid arthritis, unspecified: Secondary | ICD-10-CM | POA: Diagnosis not present

## 2013-10-27 DIAGNOSIS — K7689 Other specified diseases of liver: Secondary | ICD-10-CM | POA: Diagnosis not present

## 2013-10-27 DIAGNOSIS — R945 Abnormal results of liver function studies: Secondary | ICD-10-CM | POA: Insufficient documentation

## 2013-10-27 DIAGNOSIS — F411 Generalized anxiety disorder: Secondary | ICD-10-CM | POA: Diagnosis not present

## 2013-10-27 DIAGNOSIS — R000 Tachycardia, unspecified: Secondary | ICD-10-CM | POA: Insufficient documentation

## 2013-10-27 DIAGNOSIS — F17200 Nicotine dependence, unspecified, uncomplicated: Secondary | ICD-10-CM | POA: Insufficient documentation

## 2013-10-27 DIAGNOSIS — G894 Chronic pain syndrome: Secondary | ICD-10-CM | POA: Diagnosis not present

## 2013-10-27 DIAGNOSIS — Z96649 Presence of unspecified artificial hip joint: Secondary | ICD-10-CM | POA: Diagnosis not present

## 2013-10-27 DIAGNOSIS — E119 Type 2 diabetes mellitus without complications: Secondary | ICD-10-CM | POA: Diagnosis not present

## 2013-10-27 DIAGNOSIS — I10 Essential (primary) hypertension: Secondary | ICD-10-CM | POA: Diagnosis not present

## 2013-10-27 DIAGNOSIS — K7460 Unspecified cirrhosis of liver: Secondary | ICD-10-CM | POA: Diagnosis not present

## 2013-10-27 DIAGNOSIS — J4489 Other specified chronic obstructive pulmonary disease: Secondary | ICD-10-CM | POA: Insufficient documentation

## 2013-10-27 DIAGNOSIS — E785 Hyperlipidemia, unspecified: Secondary | ICD-10-CM | POA: Diagnosis not present

## 2013-10-27 DIAGNOSIS — G4733 Obstructive sleep apnea (adult) (pediatric): Secondary | ICD-10-CM | POA: Diagnosis not present

## 2013-10-27 DIAGNOSIS — M109 Gout, unspecified: Secondary | ICD-10-CM | POA: Insufficient documentation

## 2013-10-27 DIAGNOSIS — I872 Venous insufficiency (chronic) (peripheral): Secondary | ICD-10-CM | POA: Insufficient documentation

## 2013-10-27 DIAGNOSIS — F329 Major depressive disorder, single episode, unspecified: Secondary | ICD-10-CM | POA: Diagnosis not present

## 2013-10-27 DIAGNOSIS — Z79899 Other long term (current) drug therapy: Secondary | ICD-10-CM | POA: Diagnosis not present

## 2013-10-27 DIAGNOSIS — F1010 Alcohol abuse, uncomplicated: Secondary | ICD-10-CM | POA: Insufficient documentation

## 2013-10-27 DIAGNOSIS — J449 Chronic obstructive pulmonary disease, unspecified: Secondary | ICD-10-CM | POA: Diagnosis not present

## 2013-10-27 DIAGNOSIS — F3289 Other specified depressive episodes: Secondary | ICD-10-CM | POA: Diagnosis not present

## 2013-10-27 DIAGNOSIS — R7989 Other specified abnormal findings of blood chemistry: Secondary | ICD-10-CM

## 2013-10-27 LAB — CBC
HEMATOCRIT: 47.6 % (ref 39.0–52.0)
HEMOGLOBIN: 15.6 g/dL (ref 13.0–17.0)
MCH: 29.7 pg (ref 26.0–34.0)
MCHC: 32.8 g/dL (ref 30.0–36.0)
MCV: 90.7 fL (ref 78.0–100.0)
Platelets: 237 10*3/uL (ref 150–400)
RBC: 5.25 MIL/uL (ref 4.22–5.81)
RDW: 15.7 % — AB (ref 11.5–15.5)
WBC: 16.5 10*3/uL — AB (ref 4.0–10.5)

## 2013-10-27 LAB — PROTIME-INR
INR: 0.95 (ref 0.00–1.49)
Prothrombin Time: 12.7 seconds (ref 11.6–15.2)

## 2013-10-27 LAB — GLUCOSE, CAPILLARY: GLUCOSE-CAPILLARY: 123 mg/dL — AB (ref 70–99)

## 2013-10-27 LAB — APTT: aPTT: 26 seconds (ref 24–37)

## 2013-10-27 MED ORDER — FENTANYL CITRATE 0.05 MG/ML IJ SOLN
INTRAMUSCULAR | Status: AC
  Filled 2013-10-27: qty 6

## 2013-10-27 MED ORDER — SODIUM CHLORIDE 0.9 % IV SOLN
INTRAVENOUS | Status: DC
  Administered 2013-10-27: 11:00:00 via INTRAVENOUS

## 2013-10-27 MED ORDER — FENTANYL CITRATE 0.05 MG/ML IJ SOLN
INTRAMUSCULAR | Status: AC | PRN
  Administered 2013-10-27 (×2): 50 ug via INTRAVENOUS

## 2013-10-27 MED ORDER — MIDAZOLAM HCL 2 MG/2ML IJ SOLN
INTRAMUSCULAR | Status: AC
  Filled 2013-10-27: qty 6

## 2013-10-27 MED ORDER — MIDAZOLAM HCL 2 MG/2ML IJ SOLN
INTRAMUSCULAR | Status: AC | PRN
  Administered 2013-10-27 (×4): 0.5 mg via INTRAVENOUS

## 2013-10-27 MED ORDER — ALBUTEROL SULFATE (2.5 MG/3ML) 0.083% IN NEBU
2.5000 mg | INHALATION_SOLUTION | Freq: Once | RESPIRATORY_TRACT | Status: AC
  Administered 2013-10-27: 2.5 mg via RESPIRATORY_TRACT
  Filled 2013-10-27 (×2): qty 3

## 2013-10-27 NOTE — Procedures (Signed)
Technically successful US guided biopsy of left lobe of liver.  No immediate complications.

## 2013-10-27 NOTE — Progress Notes (Signed)
Pt states he wants to be discharged now.  Informed pt the orders read that he can go at 1630, but pt is persistent in saying he is leaving now, he has things to do.  Informed pt to let me call Dr.Watts and ask is it okay for him to go ahead and leave.  Pt says okay for that.  Spoke with Dr. Pascal Lux, who also came over to see pt and Dr. Pascal Lux states it is okay for pt to go ahead and leave now.

## 2013-10-27 NOTE — H&P (Signed)
Andrew Fowler is an 53 y.o. male.   Chief Complaint: "I'm having a liver biopsy" HPI: Patient with history of DM, morbid obesity, prior heavy alcohol use, past methotrexate use and elevated liver function tests/ hepatitis B antibody positive 2013 presents today for US guided random liver biopsy.  Past Medical History  Diagnosis Date  . Hypertension     ACEI monotherapy  . Hyperlipidemia     At goal of LDL <100 with statin.  . Obesity, Class III, BMI 40-49.9 (morbid obesity)   . Tobacco abuse     Per pt, he has a diagnosis of COPD. Need to locate PFT's.  . Gout     Pt has never had a crystal diagnosis.  . Diabetes mellitus     Non-insulin dependent Type II.  Marland Kitchen Obstructive sleep apnea 08/21/08    Sleep study AHI 16.4 with desat to 66%. CPAP of 17 decreased AHI to 0.9. Non-compliant with CPAP.  Marland Kitchen Anxiety associated with depression 04/19/2011    On chronic benzos and SSRI's. Gets panic attacks. Does not see mental health.  Marland Kitchen COPD (chronic obstructive pulmonary disease)     Per pt, he has a diagnosis of COPD. No PFT's. Uses Alb MDI less than once a month.  . RA (rheumatoid arthritis) 2013  . Chronic venous insufficiency 2013    ECHO 2013 was normal. LFT's, creatinine, TSH all normal. Alb a bit low.  . Shortness of breath   . Hepatitis B antibody positive 2013    IgM Hep B core Ab + but Hepatitis B viral load negative.   . Abnormal laboratory test result 07/04/2011    Immunofixation electrophoresis 3/13 showed slightly restricted mobility in the IgG and kappa lanes and suggested repeat end of year.   . Elevated liver function tests 05/17/2011    04/2010. Increased GGT (pt uses ETOH). AMA negative. ABD U/S limited but otherwise negative. Follow Alk phos level. AST & ALT also elevated. False + IgM Hep B Core Ab. Hep B viral load negative.   Same pattern during hospitalization 2010 : highest alkaline phosphatase was 355, AST 259, and ALT 871. ANA, rheumatoid factor, ceruloplasmin, CMV IgM, alpha  antitrypsin, and AMA, all within normal limits.  . Chronic pain syndrome 04/19/2011    Combination of RA, obesity, OA, & DDD. Has seen Dr Sharol Given & s/p R total hip arthroplasty 2/2 OA & ? AVN. s/p L4-L5 laminectomy. Lumbar MRI 4/11 : L3-L4 mod central canal narrowing.  Cervical MRI 6/11 : multi-level DDD and L foraminal stenosis at C4-5 and C5-6.     Past Surgical History  Procedure Laterality Date  . Laminectomy  1995    L4-L5  . Total hip arthroplasty Left 2009  . Tonsillectomy      Family History  Problem Relation Age of Onset  . Diabetes Mother   . Heart attack Mother   . Heart disease Mother   . Diabetes Father   . Kidney disease Father    Social History:  reports that he has been smoking Cigarettes.  He has a 10 pack-year smoking history. He has never used smokeless tobacco. He reports that he does not drink alcohol or use illicit drugs.  Allergies: No Known Allergies  Current outpatient prescriptions:ALPRAZolam (XANAX) 0.5 MG tablet, Take 1 tablet (0.5 mg total) by mouth 2 (two) times daily as needed for anxiety., Disp: 60 tablet, Rfl: 5;  aspirin EC 81 MG tablet, Take 81 mg by mouth daily., Disp: , Rfl: ;  glyBURIDE (DIABETA)  5 MG tablet, Take 1 tablet (5 mg total) by mouth daily with breakfast., Disp: 30 tablet, Rfl: 11 HYDROcodone-acetaminophen (NORCO) 10-325 MG per tablet, Take 1 tablet by mouth every 6 (six) hours as needed (Pain)., Disp: , Rfl: ;  ibuprofen (ADVIL,MOTRIN) 200 MG tablet, Take 400 mg by mouth every 6 (six) hours as needed (Pain)., Disp: , Rfl: ;  lisinopril (PRINIVIL,ZESTRIL) 10 MG tablet, Take 10 mg by mouth at bedtime., Disp: , Rfl: ;  metFORMIN (GLUCOPHAGE) 1000 MG tablet, Take 1,000 mg by mouth 2 (two) times daily with a meal., Disp: , Rfl:  predniSONE (DELTASONE) 10 MG tablet, Take 10 mg by mouth daily., Disp: , Rfl: ;  simvastatin (ZOCOR) 40 MG tablet, Take 40 mg by mouth daily., Disp: , Rfl: ;  albuterol (VENTOLIN HFA) 108 (90 BASE) MCG/ACT inhaler, Inhale  1-2 puffs into the lungs every 4 (four) hours as needed. shortness of breath, Disp: 1 Inhaler, Rfl: 5;  etanercept (ENBREL) 25 MG injection, Inject 2 mLs (50 mg total) into the skin once a week., Disp: , Rfl:  glucose blood (ACCU-CHEK AVIVA PLUS) test strip, 1 each by Other route daily., Disp: 100 each, Rfl: 5;  nicotine (NICODERM CQ - DOSED IN MG/24 HOURS) 21 mg/24hr patch, Place 1 patch (21 mg total) onto the skin daily., Disp: 45 patch, Rfl: 0;  tiotropium (SPIRIVA HANDIHALER) 18 MCG inhalation capsule, Place 1 capsule (18 mcg total) into inhaler and inhale daily., Disp: 30 capsule, Rfl: 2 Current facility-administered medications:0.9 %  sodium chloride infusion, , Intravenous, Continuous, Hedy Jacob, PA-C, Last Rate: 50 mL/hr at 10/27/13 1116   Results for orders placed during the hospital encounter of 10/27/13 (from the past 48 hour(s))  CBC     Status: Abnormal   Collection Time    10/27/13 11:20 AM      Result Value Ref Range   WBC 16.5 (*) 4.0 - 10.5 K/uL   RBC 5.25  4.22 - 5.81 MIL/uL   Hemoglobin 15.6  13.0 - 17.0 g/dL   HCT 47.6  39.0 - 52.0 %   MCV 90.7  78.0 - 100.0 fL   MCH 29.7  26.0 - 34.0 pg   MCHC 32.8  30.0 - 36.0 g/dL   RDW 15.7 (*) 11.5 - 15.5 %   Platelets 237  150 - 400 K/uL   No results found.  Review of Systems  Constitutional: Negative for fever and chills.  Respiratory: Negative for hemoptysis.        Occ cough, dyspnea with exertion  Cardiovascular: Negative for chest pain.  Gastrointestinal: Positive for abdominal pain. Negative for nausea, vomiting and blood in stool.  Genitourinary: Negative for dysuria and hematuria.  Musculoskeletal: Positive for back pain.  Neurological: Negative for headaches.  Endo/Heme/Allergies: Does not bruise/bleed easily.    Blood pressure 115/79, pulse 111, temperature 98.2 F (36.8 C), temperature source Oral, resp. rate 22, SpO2 96.00%. Physical Exam  Constitutional: He is oriented to person, place, and time. He  appears well-developed and well-nourished.  Cardiovascular: Regular rhythm.   tachycardic  Respiratory: Effort normal.  scatt exp wheezes note  GI: Soft. There is tenderness.  Morbidly obese  Musculoskeletal: Normal range of motion. He exhibits no edema.  Neurological: He is alert and oriented to person, place, and time.     Assessment/Plan  Patient with history of DM, morbid obesity, prior heavy alcohol use, past methotrexate use and elevated liver function tests/ hepatitis B antibody positive 2013 presents today for US guided random  liver biopsy. Details/risks of procedure d/w pt with his understanding and consent. Will give albuterol neb prior to procedure secondary to wheezing.  Baleria Wyman,D KEVIN 10/27/2013, 11:56 AM

## 2013-10-27 NOTE — Progress Notes (Signed)
Previous nurse, Mardelle Matte went over the discharge instructions with the pt.

## 2013-10-27 NOTE — Discharge Instructions (Signed)
Liver Biopsy, Care After Refer to this sheet in the next few weeks. These instructions provide you with information on caring for yourself after your procedure. Your health care provider may also give you more specific instructions. Your treatment has been planned according to current medical practices, but problems sometimes occur. Call your health care provider if you have any problems or questions after your procedure. WHAT TO EXPECT AFTER THE PROCEDURE After your procedure, it is typical to have the following:  A small amount of discomfort in the area where the biopsy was done and in the right shoulder or shoulder blade.  A small amount of bruising around the area where the biopsy was done and on the skin over the liver.  Sleepiness and fatigue for the rest of the day. HOME CARE INSTRUCTIONS   Rest at home for 1-2 days or as directed by your health care provider.  Have a friend or family member stay with you for at least 24 hours.  Because of the medicines used during the procedure, you should not do the following things in the first 24 hours:  Drive.  Use machinery.  Be responsible for the care of other people.  Sign legal documents.  Take a bath or shower.  There are many different ways to close and cover an incision, including stitches, skin glue, and adhesive strips. Follow your health care provider's instructions on:  Incision care.  Bandage (dressing) changes and removal.  Incision closure removal.  Do not drink alcohol in the first week.  Do not lift more than 5 pounds or play contact sports for 2 weeks after this test.  Take medicines only as directed by your health care provider. Do not take medicine containing aspirin or non-steroidal anti-inflammatory medicines such as ibuprofen for 1 week after this test.  It is your responsibility to get your test results. SEEK MEDICAL CARE IF:   You have increased bleeding from an incision that results in more than a  small spot of blood.  You have redness, swelling, or increasing pain in any incisions.  You notice a discharge or a bad smell coming from any of your incisions.  You have a fever or chills. SEEK IMMEDIATE MEDICAL CARE IF:   You develop swelling, bloating, or pain in your abdomen.  You become dizzy or faint.  You develop a rash.  You are nauseous or vomit.  You have difficulty breathing, feel short of breath, or feel faint.  You develop chest pain.  You have problems with your speech or vision.  You have trouble balancing or moving your arms or legs. Document Released: 09/28/2004 Document Revised: 07/26/2013 Document Reviewed: 05/07/2013 [facility] Patient Information 2015 [place], [place]. This information is not intended to replace advice given to you by your health care provider. Make sure you discuss any questions you have with your health care provider. Conscious Sedation Sedation is the use of medicines to promote relaxation and relieve discomfort and anxiety. Conscious sedation is a type of sedation. Under conscious sedation you are less alert than normal but are still able to respond to instructions or stimulation. Conscious sedation is used during short medical and dental procedures. It is milder than deep sedation or general anesthesia and allows you to return to your regular activities sooner.  LET [facility] CARE PROVIDER KNOW ABOUT:   Any allergies you have.  All medicines you are taking, including vitamins, herbs, eye drops, creams, and over-the-counter medicines.  Use of steroids (by mouth or creams).  Previous problems you or members of your family have had with the use of anesthetics.  Any blood disorders you have.  Previous surgeries you have had.  Medical conditions you have.  Possibility of pregnancy, if this applies.  Use of cigarettes, alcohol, or illegal drugs. RISKS AND COMPLICATIONS Generally, this is a safe procedure. However, as with any  procedure, problems can occur. Possible problems include:  Oversedation.  Trouble breathing on your own. You may need to have a breathing tube until you are awake and breathing on your own.  Allergic reaction to any of the medicines used for the procedure. BEFORE THE PROCEDURE  You may have blood tests done. These tests can help show how well your kidneys and liver are working. They can also show how well your blood clots.  A physical exam will be done.  Only take medicines as directed by your health care provider. You may need to stop taking medicines (such as blood thinners, aspirin, or nonsteroidal anti-inflammatory drugs) before the procedure.   Do not eat or drink at least 6 hours before the procedure or as directed by your health care provider.  Arrange for a responsible adult, family member, or friend to take you home after the procedure. He or she should stay with you for at least 24 hours after the procedure, until the medicine has worn off. PROCEDURE   An intravenous (IV) catheter will be inserted into one of your veins. Medicine will be able to flow directly into your body through this catheter. You may be given medicine through this tube to help prevent pain and help you relax.  The medical or dental procedure will be done. AFTER THE PROCEDURE  You will stay in a recovery area until the medicine has worn off. Your blood pressure and pulse will be checked.   Depending on the procedure you had, you may be allowed to go home when you can tolerate liquids and your pain is under control. Document Released: 12/04/2000 Document Revised: 03/16/2013 Document Reviewed: 11/16/2012 [facility] Patient Information 2015 [place], [place]. This information is not intended to replace advice given to you by your health care provider. Make sure you discuss any questions you have with your health care provider.

## 2013-11-02 ENCOUNTER — Encounter: Payer: Self-pay | Admitting: Internal Medicine

## 2013-11-02 NOTE — Progress Notes (Signed)
Quick Note:  Liver biopsy shows inflammation and scarring - headed towards cirrhosis The cause seems to be fatty liver related to his obesity, diabetes, etc  I recommend he be evaluated for bariatric surgery to turn this around - it is his best hope - we can give him the phone # for seminar  No alcohol and stay off methotrexate  See me in Oct/Nov also (next avail) ______

## 2013-11-03 ENCOUNTER — Telehealth: Payer: Self-pay | Admitting: *Deleted

## 2013-11-03 NOTE — Telephone Encounter (Signed)
Return call to pt, after he had called and voiced concern that office had not returned his call from 8/07.  Pt states the last time he saw [facility] (note in media), she had wanted to start remicade infusion, but due to insurance, this will need to be arranged through PCP. Pt was also under the impression that if he lost weight, he wouldn't  have to have the infusions.  He wanted to know more about the bariatric program at [facility] and was provided with the number.  Pt was informed that not all insurances will not pay for this procedure and some require documentation that pt has attempted to lose weight on their own.  Pt would like me to make DrButcher aware of this and to see if she feels he should go ahead and proceed with the infusions.  Of note, pt has an appt scheduled for 12/23/2013 at 8:45am with pcp.  Please advise.Despina Hidden Cassady8/12/20151:07 PM

## 2013-11-05 ENCOUNTER — Other Ambulatory Visit: Payer: Self-pay | Admitting: Internal Medicine

## 2013-11-05 NOTE — Telephone Encounter (Signed)
Called pt and answered his pts. Entered referral to shana to see if [facility] can provide home scales. To see me Oct.

## 2013-12-01 ENCOUNTER — Telehealth: Payer: Self-pay | Admitting: Licensed Clinical Social Worker

## 2013-12-01 NOTE — Telephone Encounter (Signed)
[name] was referred to CSW for referral to [facility] to assist [name] with a weight scale at home.  CSW discussed with [facility] Andrew Fowler, scale available.  CSW placed call to [name], pt in agreement with referral.  CSW faxed referral to [facility].

## 2013-12-22 ENCOUNTER — Encounter: Payer: Self-pay | Admitting: Licensed Clinical Social Worker

## 2013-12-22 NOTE — Assessment & Plan Note (Addendum)
Lab Results  Component Value Date   HGBA1C 6.0 12/23/2013    A1C has decreased from 7.9 to 6.0. He cont to try to eat healthy with 2 servings veg and one protein. He tries to eat healthy snacks instead of chips. He does allow one day to splurge. He is on Metformin 1000 BID and glyburide 5 QD. Flu shot today. Need to talk about eyes next appt.

## 2013-12-22 NOTE — Progress Notes (Signed)
   Subjective:    Patient ID: Andrew Fowler, male    DOB: 11-Mar-1961, 53 y.o.   MRN: [id number]  HPI  Please see the A&P for the status of the pt's chronic medical problems.  Review of Systems  Constitutional: Negative for appetite change and unexpected weight change.  Gastrointestinal: Positive for abdominal pain.  Musculoskeletal: Positive for arthralgias. Negative for joint swelling.  Neurological: Negative for dizziness and light-headedness.  Psychiatric/Behavioral: The patient is not nervous/anxious.        Objective:   Physical Exam  Constitutional: He is oriented to person, place, and time. He appears well-developed and well-nourished. No distress.  Obese, smells like smoke  HENT:  Head: Normocephalic and atraumatic.  Right Ear: External ear normal.  Left Ear: External ear normal.  Nose: Nose normal.  Neurological: He is alert and oriented to person, place, and time.  Skin: Skin is warm and dry. He is not diaphoretic.  Psychiatric: He has a normal mood and affect. His behavior is normal. Judgment and thought content normal.          Assessment & Plan:

## 2013-12-22 NOTE — Progress Notes (Signed)
Patient ID: Andrew Fowler, male   DOB: 01-05-1961, 53 y.o.   MRN: [id number] CMIS message from [place] LPN received 8/35/07:  PCP referral home visit was on 12-16-13 Home visit with 53 year old male with diagnosis of COPD, Diabetes, HTN, Liver disease, Rheumatoid Arthritis and Obesity. The PCA was signed and privacy practice was given to the patient. The IA and PHQ-9 was performed. The Five Wishes was explained to the patient and copy was left with the patient to review and fill out. Handouts on smoking, keeping a food diary, and eating a balanced meal was given to patient, all the information was from [employer]. A scale and weight log form was also given to the patient. [employer], calendar, lunch bag and bag was also given. The patient would like a referral to podiatrist for diabetic shoes. [facility] referral was offered; he is interested but not at this time.

## 2013-12-23 ENCOUNTER — Ambulatory Visit (INDEPENDENT_AMBULATORY_CARE_PROVIDER_SITE_OTHER): Payer: Medicaid Other | Admitting: Internal Medicine

## 2013-12-23 VITALS — BP 94/54 | HR 108 | Temp 97.6°F | Wt 325.0 lb

## 2013-12-23 DIAGNOSIS — G894 Chronic pain syndrome: Secondary | ICD-10-CM

## 2013-12-23 DIAGNOSIS — M069 Rheumatoid arthritis, unspecified: Secondary | ICD-10-CM

## 2013-12-23 DIAGNOSIS — K7581 Nonalcoholic steatohepatitis (NASH): Secondary | ICD-10-CM | POA: Diagnosis not present

## 2013-12-23 DIAGNOSIS — G4733 Obstructive sleep apnea (adult) (pediatric): Secondary | ICD-10-CM

## 2013-12-23 DIAGNOSIS — Z720 Tobacco use: Secondary | ICD-10-CM

## 2013-12-23 DIAGNOSIS — E119 Type 2 diabetes mellitus without complications: Secondary | ICD-10-CM | POA: Diagnosis present

## 2013-12-23 DIAGNOSIS — E785 Hyperlipidemia, unspecified: Secondary | ICD-10-CM

## 2013-12-23 DIAGNOSIS — D72829 Elevated white blood cell count, unspecified: Secondary | ICD-10-CM | POA: Diagnosis not present

## 2013-12-23 DIAGNOSIS — F17200 Nicotine dependence, unspecified, uncomplicated: Secondary | ICD-10-CM

## 2013-12-23 DIAGNOSIS — Z23 Encounter for immunization: Secondary | ICD-10-CM | POA: Diagnosis not present

## 2013-12-23 DIAGNOSIS — I10 Essential (primary) hypertension: Secondary | ICD-10-CM

## 2013-12-23 LAB — POCT GLYCOSYLATED HEMOGLOBIN (HGB A1C): Hemoglobin A1C: 6

## 2013-12-23 LAB — GLUCOSE, CAPILLARY: Glucose-Capillary: 135 mg/dL — ABNORMAL HIGH (ref 70–99)

## 2013-12-23 MED ORDER — GLYBURIDE 5 MG PO TABS: 5.0000 mg | ORAL_TABLET | Freq: Every day | ORAL | Status: DC

## 2013-12-23 MED ORDER — HYDROCODONE-ACETAMINOPHEN 10-325 MG PO TABS
1.0000 | ORAL_TABLET | Freq: Four times a day (QID) | ORAL | Status: DC | PRN
Start: 2013-12-23 — End: 2013-12-23

## 2013-12-23 MED ORDER — LISINOPRIL 10 MG PO TABS: 10.0000 mg | ORAL_TABLET | Freq: Every day | ORAL | Status: DC

## 2013-12-23 MED ORDER — VARENICLINE TARTRATE 1 MG PO TABS: 1.0000 mg | ORAL_TABLET | Freq: Two times a day (BID) | ORAL | Status: DC

## 2013-12-23 MED ORDER — HYDROCODONE-ACETAMINOPHEN 10-325 MG PO TABS: 1.0000 | ORAL_TABLET | Freq: Four times a day (QID) | ORAL | Status: DC | PRN

## 2013-12-23 MED ORDER — VARENICLINE TARTRATE 0.5 MG PO TABS: ORAL_TABLET | ORAL | Status: DC

## 2013-12-23 NOTE — Assessment & Plan Note (Signed)
He cont to have questions about benefits of bariatric surgery vs medical weight loss. He cont to lose weight via diet changes and some increased activity. I think now that his RA is controlled, he might be able to move a bit more. I explained that there are some benefits of the surgery beyond simply the weight loss (metabolic effects) but that we are still learning all of the effects of surgery. I encouraged him to attend the intro session so that we could learn more, ask questions, and meet the people involved and he agreed.   He has F/U Dr Carlean Purl

## 2013-12-23 NOTE — Assessment & Plan Note (Signed)
He is on a statin and FLP due to today.

## 2013-12-23 NOTE — Assessment & Plan Note (Signed)
BP Readings from Last 3 Encounters:  12/23/13 94/54  10/27/13 112/61  10/12/13 100/60   BP is now low although he is asymptomatic. He is losing weight and eating healthier which is likely contributing. He has never had microalb so will stop ACEI. He has BP cuff at home and will monitor. Parameters given.

## 2013-12-23 NOTE — Assessment & Plan Note (Signed)
Not able to quit with nicotine patches. Desires to try Chantix. We review side effects and instructions. Rx sent

## 2013-12-23 NOTE — Assessment & Plan Note (Addendum)
He has been started on a new biologic by Rheum and he states it is working quite well. He is no longer getting the swelling although he still gets some pain. He no longer has to use ice or heating packs. He is walking QOD - would be QD but now that he is walking more, his R knee is hurting and his R foot is turning out. I refilled and gave three paper Rx of his hydrocodone 10 #100 per month to him. Hopefully with better RA control we can decrease usage of opioids although I suspect he has other reasons for chronic pain. PT would be great for his R knee pain but medicaid severely limits PT visits. No red flags.

## 2013-12-23 NOTE — Assessment & Plan Note (Signed)
Cont to lose weight with diet and movement.  Wt Readings from Last 3 Encounters:  12/23/13 325 lb (147.419 kg)  10/12/13 329 lb (149.233 kg)  09/30/13 331 lb 8 oz (150.367 kg)

## 2013-12-23 NOTE — Patient Instructions (Addendum)
1. Stop the lisinopril 2. Check your BP twice a week. Call me if < 100/70 or > 140/90 orif you feel dizzy lightheaded 3. I sent your referral to podiatry 4. Go to weight loss seminar 5. [facility] will call about sleep machine and mattress 6. I gave you three paper scripts for your pain med 7. Good luck with the chantix and stopping smoking 8. See me in 3 months 9. Weigh your self at least twice a week

## 2013-12-23 NOTE — Assessment & Plan Note (Signed)
See chronic pain A&P

## 2013-12-24 ENCOUNTER — Other Ambulatory Visit: Payer: Self-pay | Admitting: Internal Medicine

## 2013-12-24 DIAGNOSIS — D72829 Elevated white blood cell count, unspecified: Secondary | ICD-10-CM

## 2013-12-24 LAB — CBC WITH DIFFERENTIAL/PLATELET
BASOS ABS: 0 10*3/uL (ref 0.0–0.1)
BASOS PCT: 0 % (ref 0–1)
EOS ABS: 0.1 10*3/uL (ref 0.0–0.7)
EOS PCT: 1 % (ref 0–5)
HCT: 46.3 % (ref 39.0–52.0)
Hemoglobin: 15.3 g/dL (ref 13.0–17.0)
Lymphocytes Relative: 23 % (ref 12–46)
Lymphs Abs: 3.4 10*3/uL (ref 0.7–4.0)
MCH: 29.6 pg (ref 26.0–34.0)
MCHC: 33 g/dL (ref 30.0–36.0)
MCV: 89.6 fL (ref 78.0–100.0)
MONO ABS: 0.7 10*3/uL (ref 0.1–1.0)
Monocytes Relative: 5 % (ref 3–12)
NEUTROS ABS: 10.4 10*3/uL — AB (ref 1.7–7.7)
Neutrophils Relative %: 71 % (ref 43–77)
Platelets: 266 10*3/uL (ref 150–400)
RBC: 5.17 MIL/uL (ref 4.22–5.81)
RDW: 14.9 % (ref 11.5–15.5)
WBC: 14.6 10*3/uL — ABNORMAL HIGH (ref 4.0–10.5)

## 2013-12-24 LAB — BASIC METABOLIC PANEL WITH GFR
BUN: 14 mg/dL (ref 6–23)
CALCIUM: 9.6 mg/dL (ref 8.4–10.5)
CO2: 27 mEq/L (ref 19–32)
CREATININE: 0.92 mg/dL (ref 0.50–1.35)
Chloride: 97 mEq/L (ref 96–112)
GLUCOSE: 110 mg/dL — AB (ref 70–99)
Potassium: 4.1 mEq/L (ref 3.5–5.3)
Sodium: 136 mEq/L (ref 135–145)

## 2013-12-24 LAB — LIPID PANEL
Cholesterol: 136 mg/dL (ref 0–200)
HDL: 56 mg/dL (ref >39)
LDL CALC: 47 mg/dL (ref 0–99)
TRIGLYCERIDES: 164 mg/dL — AB (ref <150)
Total CHOL/HDL Ratio: 2.4 Ratio
VLDL: 33 mg/dL (ref 0–40)

## 2013-12-24 NOTE — Addendum Note (Signed)
Addended by: Truddie Crumble on: 12/24/2013 09:44 AM   Modules accepted: Orders

## 2013-12-27 LAB — PATHOLOGIST SMEAR REVIEW

## 2013-12-28 ENCOUNTER — Other Ambulatory Visit: Payer: Self-pay | Admitting: Internal Medicine

## 2013-12-28 DIAGNOSIS — G4733 Obstructive sleep apnea (adult) (pediatric): Secondary | ICD-10-CM

## 2014-01-03 ENCOUNTER — Telehealth: Payer: Self-pay | Admitting: Internal Medicine

## 2014-01-03 ENCOUNTER — Ambulatory Visit: Payer: Medicaid Other | Admitting: Internal Medicine

## 2014-01-03 NOTE — Telephone Encounter (Signed)
Do not bill

## 2014-01-11 ENCOUNTER — Encounter: Payer: Self-pay | Admitting: Internal Medicine

## 2014-01-11 DIAGNOSIS — D72829 Elevated white blood cell count, unspecified: Secondary | ICD-10-CM

## 2014-01-11 HISTORY — DX: Elevated white blood cell count, unspecified: D72.829

## 2014-01-12 ENCOUNTER — Telehealth: Payer: Self-pay | Admitting: Dietician

## 2014-01-12 NOTE — Telephone Encounter (Signed)
Patient has been working on weight loss and wants to decrease his weight a bit more. Answered his questions- calorie needs/day for weight loss ~ 2400-2500 calories/day, advised him to aim for 500-600 for meals and 100- 200 for snacks and be sure he is getting well balanced meals/snacks. He verbalized understanding and appreciation.

## 2014-01-13 ENCOUNTER — Ambulatory Visit: Payer: Self-pay | Admitting: Podiatry

## 2014-01-19 ENCOUNTER — Telehealth: Payer: Self-pay | Admitting: Internal Medicine

## 2014-01-19 NOTE — Telephone Encounter (Signed)
Patient advised that there has been no phone call from this office.  He is advised he does need follow up from liver biopsy in August.  He is offered an appt for 12/18 but he needs an afternoon appt, he will call back next week when the new schedule comes out

## 2014-01-27 ENCOUNTER — Ambulatory Visit (INDEPENDENT_AMBULATORY_CARE_PROVIDER_SITE_OTHER): Payer: Medicaid Other | Admitting: Podiatry

## 2014-01-27 VITALS — BP 125/86 | HR 107 | Resp 18 | Ht 70.0 in | Wt 325.0 lb

## 2014-01-27 DIAGNOSIS — Q828 Other specified congenital malformations of skin: Secondary | ICD-10-CM | POA: Diagnosis not present

## 2014-01-27 DIAGNOSIS — E1149 Type 2 diabetes mellitus with other diabetic neurological complication: Secondary | ICD-10-CM | POA: Diagnosis not present

## 2014-01-27 DIAGNOSIS — M79675 Pain in left toe(s): Secondary | ICD-10-CM

## 2014-01-27 DIAGNOSIS — M79606 Pain in leg, unspecified: Secondary | ICD-10-CM

## 2014-01-27 DIAGNOSIS — B351 Tinea unguium: Secondary | ICD-10-CM

## 2014-01-27 DIAGNOSIS — E119 Type 2 diabetes mellitus without complications: Secondary | ICD-10-CM

## 2014-01-27 NOTE — Progress Notes (Signed)
   Subjective:    Patient ID: Andrew Fowler, male    DOB: [birth year], 53 y.o.   MRN: [id number]  HPI Comments: i was informed that you were going to check my feet and trim my nails ,and fit me for diabetic shoes .  Diabetic for two years , last blood sugar was 180. Unable to walk or stand for long period of time due to bad hip replacement and arthritis   Diabetes      Review of Systems  Endocrine:       Diabetes   All other systems reviewed and are negative.      Objective:   Physical Exam: I have reviewed his past medical history medications allergy surgery social history and review of systems. Pulses are palpable bilateral. Decreased sensorium persons once the monofilament. Deep tendon reflexes are intact bilateral muscle strength is 5 over 5 dorsiflexion plantar flexors and inverters everters all intrinsic musculature is intact. Orthopedic evaluation demonstrates mild hammertoe deformities bilateral. Cutaneous evaluation demonstrates supple well-hydrated cutis moderate edema bilateral. Porokeratotic lesions plantar aspect of the bilateral foot no signs of infection. Nails are thick yellow dystrophic onychomycotic and severely thickened.        Assessment & Plan:  Assessment: Diabetes mellitus. Pain in limb secondary to porokeratosis and painful mycotic nails bilateral.  Plan: Debrided all reactive hyperkeratoses and debridement of nails 1 through 5 bilateral covered service secondary to pain.

## 2014-02-02 ENCOUNTER — Telehealth: Payer: Self-pay | Admitting: Dietician

## 2014-02-02 NOTE — Telephone Encounter (Signed)
Patient called with questions about nutrition: portion sizes of meats and reading food labels for fats. He says his weight is 320# on his scale which is decreased from 345, he is trying to aim for ~ 1800 calories. His goal is eventually 200# but he wants to lose it gradually and keep it off. His brother is supportive. He weighs every Monday. Encouraged this or more often and we discussed how to handle slips. Recommeded fat gram limit of ~80 grams/day

## 2014-02-09 ENCOUNTER — Ambulatory Visit (INDEPENDENT_AMBULATORY_CARE_PROVIDER_SITE_OTHER): Payer: Medicaid Other | Admitting: Dietician

## 2014-02-09 ENCOUNTER — Encounter: Payer: Self-pay | Admitting: Dietician

## 2014-02-09 VITALS — Wt 320.4 lb

## 2014-02-09 DIAGNOSIS — E119 Type 2 diabetes mellitus without complications: Secondary | ICD-10-CM

## 2014-02-09 NOTE — Progress Notes (Signed)
  Medical Nutrition Therapy:  Appt start time: 1435 end time:  1525.  Assessment:  Primary concerns today: wants to know more about portion sizes, label reading.  Patient has called several times with very good questions about label reading and nutrition. Today he is here with his brother who is helping him and who also is wanting to decrease his weight.  They both quit smoking three days ago and eats the same foods and are supportive of each other. They both stopped drinking alcohol as well before this.  He is concerned about his fatty liver and his  goal is to decrease his weight to 220# in 1 year.His food records showed [phone] calories/day, I suspect he is not recording snacks. We discussed that this is aggressive weight loss and 50# a year may be more healthy weight loss- ~ 4# per month. He did not bring his meter, (requests a new on)  denies hypoglycemia, blood sugar once daily, 95 today fasting.  Preferred Learning Style: Auditory and  Visual   Learning Readiness: Ready and Change in progress MEDICATIONS: prednisone, glyburide can both increase appetite and deter weight loss  DIETARY INTAKE: Usual eating pattern includes 3 meals and 3 snacks per day. Everyday foods include fruit, meat, vegetables.  Avoided foods include alcohol, soft drinks.     24-hr recall:  B ( AM): shredded wheat and milk Snk ( AM): apple  L ( PM): peanut butter and banana sandwich Snk ( PM): apple D ( PM): 2 boiled chicken thighs, green beans, pintos or corn, rice or 6" cold cut combo sub from [employer] Snk ( PM): popcorn or chips Beverages: water, diet [facility]  Usual physical activity: limited by his pain, transfers from wheel chair scooter to chair a few times a day Estimated daily energy needs for weight loss: 1800-2200 calories  220-230 g carbohydrates 95-105 g protein 65-70 g fat  Progress Towards Goal(s):  In progress. And has made some progress   Nutritional Diagnosis:  NB-1.1 Food and nutrition-related  knowledge deficit As related to lack of sufficient training on label reading and nutrition.  As evidenced by he and his brothers questions and report. .    Intervention:  Nutrition education for patient and his brother on label reading, encouraged healthy fats, portion sizes using food models and encouragement for progress made and continued progress.  Coordination of care- monitor for hypoglycemia or medicine changes as he decreases weight may not need oral hypoglycemics. steriods and lack of activity likely increasing insulin resistance and making hyoglycemia less likely at present. Teaching Method Utilized: Visual and Auditory Handouts given during visit include: nutrition Facts label reading handout, graph of his weight loss Barriers to learning/adherence to lifestyle change: multiple life changes at one time- smoking, weight loss, however both are very supportive of each other and asking for assistance as needed so feel they will be successful.  Demonstrated degree of understanding via:  Teach Back   Monitoring/Evaluation:  Dietary intake, exercise, blood glucose meter, and body weight prn. Recommend monthly to every other month check in to assure progress and address plateaus.

## 2014-02-10 ENCOUNTER — Other Ambulatory Visit: Payer: Self-pay | Admitting: Dietician

## 2014-02-10 DIAGNOSIS — E119 Type 2 diabetes mellitus without complications: Secondary | ICD-10-CM

## 2014-02-10 MED ORDER — ACCU-CHEK SOFTCLIX LANCETS MISC: Status: DC

## 2014-02-10 MED ORDER — ACCU-CHEK AVIVA PLUS W/DEVICE KIT: PACK | Status: DC

## 2014-02-10 MED ORDER — GLUCOSE BLOOD VI STRP: ORAL_STRIP | Status: DC

## 2014-02-10 NOTE — Telephone Encounter (Signed)
Patient request new meter to check blood sugar and  supplies

## 2014-03-14 ENCOUNTER — Encounter: Payer: Self-pay | Admitting: Internal Medicine

## 2014-03-14 ENCOUNTER — Ambulatory Visit (INDEPENDENT_AMBULATORY_CARE_PROVIDER_SITE_OTHER): Payer: Medicaid Other | Admitting: Internal Medicine

## 2014-03-14 VITALS — BP 123/79 | HR 98 | Temp 97.5°F | Ht 70.0 in | Wt 314.3 lb

## 2014-03-14 DIAGNOSIS — G894 Chronic pain syndrome: Secondary | ICD-10-CM

## 2014-03-14 DIAGNOSIS — M25561 Pain in right knee: Secondary | ICD-10-CM

## 2014-03-14 DIAGNOSIS — E119 Type 2 diabetes mellitus without complications: Secondary | ICD-10-CM

## 2014-03-14 DIAGNOSIS — F418 Other specified anxiety disorders: Secondary | ICD-10-CM

## 2014-03-14 LAB — GLUCOSE, CAPILLARY: Glucose-Capillary: 128 mg/dL — ABNORMAL HIGH (ref 70–99)

## 2014-03-14 MED ORDER — ALPRAZOLAM 0.5 MG PO TABS: 0.5000 mg | ORAL_TABLET | Freq: Two times a day (BID) | ORAL | Status: DC | PRN

## 2014-03-14 MED ORDER — HYDROCODONE-ACETAMINOPHEN 10-325 MG PO TABS: 1.0000 | ORAL_TABLET | Freq: Four times a day (QID) | ORAL | Status: DC | PRN

## 2014-03-14 NOTE — Progress Notes (Signed)
Patient ID: Andrew Fowler, male   DOB: Sep 10, 1960, 53 y.o.   MRN: [id number]   Subjective:   Patient ID: Andrew Fowler male   DOB: 07-Feb-1961 53 y.o.   MRN: [id number]  HPI:  [name] is a 53 y.o. with PMH listed below. Presented today for follow up visit.   Past Medical History  Diagnosis Date  . Hypertension     ACEI monotherapy  . Hyperlipidemia     At goal of LDL <100 with statin.  . Obesity, Class III, BMI 40-49.9 (morbid obesity)   . Tobacco abuse     Per pt, he has a diagnosis of COPD. Need to locate PFT's.  . Gout     Pt has never had a crystal diagnosis.  . Diabetes mellitus     Non-insulin dependent Type II.  Marland Kitchen Obstructive sleep apnea 08/21/08    Sleep study AHI 16.4 with desat to 66%. CPAP of 17 decreased AHI to 0.9. Non-compliant with CPAP.  Marland Kitchen Anxiety associated with depression 04/19/2011    On chronic benzos and SSRI's. Gets panic attacks. Does not see mental health.  Marland Kitchen COPD (chronic obstructive pulmonary disease)     Per pt, he has a diagnosis of COPD. No PFT's. Uses Alb MDI less than once a month.  . RA (rheumatoid arthritis) 2013  . Chronic venous insufficiency 2013    ECHO 2013 was normal. LFT's, creatinine, TSH all normal. Alb a bit low.  . Shortness of breath   . Hepatitis B antibody positive 2013    IgM Hep B core Ab + but Hepatitis B viral load negative.   . Abnormal laboratory test result 07/04/2011    Immunofixation electrophoresis 3/13 showed slightly restricted mobility in the IgG and kappa lanes and suggested repeat end of year.   . Elevated liver function tests 05/17/2011    04/2010. Increased GGT (pt uses ETOH). AMA negative. ABD U/S limited but otherwise negative. Follow Alk phos level. AST & ALT also elevated. False + IgM Hep B Core Ab. Hep B viral load negative.   Same pattern during hospitalization 2010 : highest alkaline phosphatase was 355, AST 259, and ALT 871. ANA, rheumatoid factor, ceruloplasmin, CMV IgM, alpha antitrypsin, and AMA,  all within normal limits.  . Chronic pain syndrome 04/19/2011    Combination of RA, obesity, OA, & DDD. Has seen Dr Sharol Given & s/p R total hip arthroplasty 2/2 OA & ? AVN. s/p L4-L5 laminectomy. Lumbar MRI 4/11 : L3-L4 mod central canal narrowing.  Cervical MRI 6/11 : multi-level DDD and L foraminal stenosis at C4-5 and C5-6.   Marland Kitchen NASH (nonalcoholic steatohepatitis), ? some EtOH contribution and fibrosis  05/17/2011    Elevated since 05/2008 during hospitalization. Relatively stable. Extensive W/U and no etiology but likely fatty liver 2/2 obesity despite normal imaging.  Hepatitis - all negative. There was an initial + IgM Hep B core Ab but repeat testing was negative and Hep B viral load was negative.  TTG 06/15/2013  ANA x 2 (05/07/11 and 06/13/2008) ASMA x2 (07/18/2011 negative & weakly + at 21 on 05/27/2011 & nega   Current Outpatient Prescriptions  Medication Sig Dispense Refill  . ACCU-CHEK SOFTCLIX LANCETS lancets Check blood sugar once a day 100 each 7  . albuterol (VENTOLIN HFA) 108 (90 BASE) MCG/ACT inhaler Inhale 1-2 puffs into the lungs every 4 (four) hours as needed. shortness of breath 1 Inhaler 5  . ALPRAZolam (XANAX) 0.5 MG tablet Take 1 tablet (0.5 mg total)  by mouth 2 (two) times daily as needed for anxiety. 60 tablet 5  . aspirin EC 81 MG tablet Take 81 mg by mouth daily.    . Blood Glucose Monitoring Suppl (ACCU-CHEK AVIVA PLUS) W/DEVICE KIT Check blood sugar once a day 1 kit 0  . etanercept (ENBREL) 25 MG injection Inject 2 mLs (50 mg total) into the skin once a week.    Marland Kitchen glucose blood (ACCU-CHEK AVIVA PLUS) test strip Check blood sugar once a day 50 each 7  . glyBURIDE (DIABETA) 5 MG tablet Take 1 tablet (5 mg total) by mouth daily with breakfast. 30 tablet 11  . HYDROcodone-acetaminophen (NORCO) 10-325 MG per tablet Take 1 tablet by mouth every 6 (six) hours as needed. 100 tablet 0  . ibuprofen (ADVIL,MOTRIN) 200 MG tablet Take 400 mg by mouth every 6 (six) hours as needed (Pain).    Marland Kitchen  lisinopril (PRINIVIL,ZESTRIL) 10 MG tablet Take 1 tablet (10 mg total) by mouth at bedtime. 30 tablet 11  . metFORMIN (GLUCOPHAGE) 1000 MG tablet Take 1,000 mg by mouth 2 (two) times daily with a meal.    . nicotine (NICODERM CQ - DOSED IN MG/24 HOURS) 21 mg/24hr patch Place 1 patch (21 mg total) onto the skin daily. 45 patch 0  . predniSONE (DELTASONE) 10 MG tablet Take 10 mg by mouth daily.    . simvastatin (ZOCOR) 40 MG tablet Take 40 mg by mouth daily.    Marland Kitchen tiotropium (SPIRIVA HANDIHALER) 18 MCG inhalation capsule Place 1 capsule (18 mcg total) into inhaler and inhale daily. 30 capsule 2  . varenicline (CHANTIX) 1 MG tablet Take 1 tablet (1 mg total) by mouth 2 (two) times daily. Take one pill daily for 3 days then one pill twice daily for days 4 - 7. 60 tablet 2   No current facility-administered medications for this visit.   Family History  Problem Relation Age of Onset  . Diabetes Mother   . Heart attack Mother   . Heart disease Mother   . Diabetes Father   . Kidney disease Father    History   Social History  . Marital Status: Single    Spouse Name: N/A    Number of Children: 0  . Years of Education: N/A   Occupational History  . disabled    Social History Main Topics  . Smoking status: Current Every Day Smoker -- 0.50 packs/day for 10 years    Types: Cigarettes  . Smokeless tobacco: Former Systems developer    Quit date: 02/06/2014     Comment: Trying to cut back.Wants Chantix.  Marland Kitchen Alcohol Use: No     Comment: 10/12/13 pt states he quit about a year  . Drug Use: No     Comment: Prior THC and crakc cocaine use.  10/12/13 7 years clean  . Sexual Activity: None   Other Topics Concern  . None   Social History Narrative   Lives with brother and his family in [place]. Nonambulatory at baseline given his RA and weight.   Review of Systems: CONSTITUTIONAL- No Fever, has lost some weight- intentionally, no change in appetite. SKIN- No Rash, colour changes or itching. HEAD- No  Headache or dizziness. Mouth/throat- No Sorethroat. RESPIRATORY- No Cough or SOB. CARDIAC- No chest pain. GI- No nausea, vomiting, diarrhoea, constipation, abd pain. URINARY- No Frequency, urgency, straining or dysuria. NEUROLOGIC- No Numbness, syncope, seizures or burning, but has knee pain- right.  Objective:  Physical Exam: Filed Vitals:   03/14/14 1338  BP: 123/79  Pulse: 98  Temp: 97.5 F (36.4 C)  TempSrc: Oral  Height: 5' 10"  (1.778 m)  Weight: 314 lb 4.8 oz (142.566 kg)  SpO2: 98%   GENERAL- alert, co-operative, not in any distress. HEENT- Atraumatic, normocephalic, PERRL, neck supple. CARDIAC- RRR, no murmurs, rubs or gallops. RESP- Moving equal volumes of air, no wheezes or crackles. ABDOMEN- Soft, nontender, obese, bowel sounds present. NEURO- No obvious Cr N abnormality, strenght upper and lower extremities- intact, Gait- Normal. EXTREMITIES- Warm and well perfused, no pedal edema. Right knee- visible larger than the left, appears to have a joint effusion, normal skin colour, not red/erythematous or warm, normal range of motion but with pain and stiffness.   SKIN- Warm, dry, No rash or lesion. PSYCH- Normal mood and affect, appropriate thought content and speech.  Assessment & Plan:   The patient's case and plan of care was discussed with attending physician, Dr. Lynnae January.  Please see problem based charting for assessment and plan.

## 2014-03-14 NOTE — Patient Instructions (Signed)
General Instructions:  We have sent in the refferal to the orthopedic doctor.   Also we will like to see you in 3 months.  Please remember to get your eye examinations done. Also you are due for a colonoscopy.   Please bring your medicines with you each time you come to clinic.  Medicines may include prescription medications, over-the-counter medications, herbal remedies, eye drops, vitamins, or other pills.

## 2014-03-14 NOTE — Progress Notes (Signed)
Internal Medicine Clinic Attending  Case discussed with Dr. Emokpae soon after the resident saw the patient.  We reviewed the resident's history and exam and pertinent patient test results.  I agree with the assessment, diagnosis, and plan of care documented in the resident's note. 

## 2014-03-15 NOTE — Assessment & Plan Note (Addendum)
Due for eye exam. He will get this done, he will schedule appointment himself.

## 2014-03-15 NOTE — Assessment & Plan Note (Signed)
Pt presently on Etanercept, which he says has been helping with his RA. But today he presented with knee pain- right, with swelling, right knee visibly larger than left, this has been present for about 2 months at least. He was seen by Dr. Trudie Reed, who gave him some steroid injections, which he says helped abit. Differentials- Gout, RA flare- though- its just the knee joint today , also consider OA. Has not had imaging of this joint yet. Pt is requesting refferal to Dr Sharol Given who has seen patient in the past for left hip pain requiring surgery. He say he will rather go there, and declined having imaging- knee xray done here pending refferal.   Also patient asked for pain medications.  Last refill- 12/2013, for three months, hydrocodone-acetamionophen 10-325 , #100 per month. I told patient i can give him a supply for one month, and then get subsequent refills form his PCP for his usual 3 months. Pt took serious offence to this, said we doctors who earn a leaving do not know how it is for those who do not, and so he has to spend more money to come here to pick up his scripts everytime.   Plan- Refferal to Orthopedics.  - Talked to my attending who felt if patient has been getting scripts filled, no red flags or contraindication, can give patient scripts, looking back, there is no documentation of this so i printed off three scripts and gave it to patient. Hydocodone- acetaminophen- 10-310m #100 tabs. For three months, last script to be filled- 05/15/2013.

## 2014-03-15 NOTE — Assessment & Plan Note (Signed)
Lab Results  Component Value Date   HGBA1C 6.0 12/23/2013   HGBA1C 7.9 09/30/2013   HGBA1C 6.0 06/29/2013     Assessment: Diabetes control:  Controlled Progress toward A1C goal:   At goal Comments: complaint with meds- Metformin 1000 BID and glyburide 5 QD. Has lost 11 pounds since visit in October.   Plan: Medications:  continue current medications Home glucose monitoring: Frequency:   Timing:   Instruction/counseling given: reminded to get eye exam Educational resources provided: brochure, handout Self management tools provided:   Other plans:

## 2014-03-15 NOTE — Assessment & Plan Note (Signed)
Patient also asking for refill of his xanax today. Symptoms controlled. Per notes he has been getting 60 tablets of xanax. He says he has been under a lot of stress, with the holidays and children around. Per notes, plan to taper him off meds slowly, was to be addressed in the fall. Talked to patient about reducing the number of pills to 48, he finally agreed- albeit a little reluctantly. He was previously on #75, now on 51.    Plan- Xanax- 0.7m BID, #55 tablets to start January, with one refill.

## 2014-03-21 ENCOUNTER — Other Ambulatory Visit: Payer: Self-pay | Admitting: Internal Medicine

## 2014-04-04 ENCOUNTER — Encounter (HOSPITAL_BASED_OUTPATIENT_CLINIC_OR_DEPARTMENT_OTHER): Payer: Medicaid Other

## 2014-04-13 ENCOUNTER — Encounter: Payer: Self-pay | Admitting: *Deleted

## 2014-04-13 LAB — HM DIABETES EYE EXAM

## 2014-05-09 ENCOUNTER — Other Ambulatory Visit: Payer: Self-pay | Admitting: Internal Medicine

## 2014-05-16 ENCOUNTER — Telehealth: Payer: Self-pay | Admitting: Dietician

## 2014-05-16 NOTE — Telephone Encounter (Signed)
PATIENT CALLED ASKING ABOUT HOW MUCH PROTEIN HE NEEDS FOR WEIGHT LOSS: RECOMMEND ~1.2 GRAMS /KG. WHICH IS ~ 150-170 GRAMS PER DAY.  3-  40Z PORTION OF PROTEIN IS ~ 90 GRAMS, 3 SERVINGS DAIRY IS 25 GRAMS, VEGETABLES AND STARCH CAN MAKE UP THE OTHER 35-45 GRAMS EASILY. MESSAGE LEFT FOR PATIENT WITH THIS INFORMATION.

## 2014-05-18 NOTE — Telephone Encounter (Signed)
Called patient to be sure he got my message. Discussed protein needs for weight loss. Encouraged adequate amounts to spare lean body mass. Encouraged lower fat when able. Discussed label reading. made an appointment for 06/09/14 after he sees the doctor.

## 2014-05-20 NOTE — Addendum Note (Signed)
Addended by: Hulan Fray on: 05/20/2014 07:31 PM   Modules accepted: Orders

## 2014-05-27 ENCOUNTER — Telehealth: Payer: Self-pay | Admitting: Dietician

## 2014-05-27 NOTE — Telephone Encounter (Signed)
Wanted to know how much fiber, sugar and review how to reach his protein needs. His weight has reached a plateau lately and he and his brother think they are eating 1200 rather than the 2000 we had discussed. They are also concerned about muscle loss and are having some difficulty interpreting the labels. We reviewed some today and will review again at hs appointment on Monday.Encouraged him that with weight,loss will come with continued concentration on increasing activity, eating sufficient protein, fruits and vegetables as well as limiting portions.  Discussed possible protein supplementation.

## 2014-05-30 ENCOUNTER — Ambulatory Visit (INDEPENDENT_AMBULATORY_CARE_PROVIDER_SITE_OTHER): Payer: Medicaid Other | Admitting: Dietician

## 2014-05-30 VITALS — Ht 71.0 in | Wt 299.0 lb

## 2014-05-30 DIAGNOSIS — Z713 Dietary counseling and surveillance: Secondary | ICD-10-CM

## 2014-05-30 DIAGNOSIS — E119 Type 2 diabetes mellitus without complications: Secondary | ICD-10-CM

## 2014-05-30 NOTE — Progress Notes (Signed)
Medical Nutrition Therapy:  Appt start time: 3335 end time:  1128.  Assessment:  Primary concerns today: wants to know more about portion sizes, label reading, protein.  Patient has lost 26# since October 2015 (6# per month) . He reports blood sugars are 70-90 and weight loss has stalled. Last seen in November 2015, he calls periodically with questions about label reading and nutrition. Today he is here with his brother who is helping him and who also is wanting to decrease his weight.  They both quit smoking then restarted.  He verbalizes concern about his fatty liver and maintains his  goal is to decrease his weight to < 220# in 1 year. He reports his best weight loss comes when his intake is  ~1200 calories/day.  We again discussed muscle loss that happens with weight loss and importance of exercise and protein intake.  Learning Readiness:  Change in progress MEDICATIONS: prednisone, glyburide can both increase appetite and deter weight loss  DIETARY INTAKE: Usual eating pattern includes 3 meals and 3 snacks per day. Everyday foods include fruit, meat, vegetables.  Avoided foods include alcohol, soft drinks.     24-hr recall:  B ( AM): [place] sandwich with diet mountain dew Snk ( AM): apple  L ( PM): tuna on ~ 2 cups salad Snk ( PM): apple D ( PM): 2 boiled chicken thighs, green beans, pintos or corn, rice or 6" cold cut combo sub from [employer] Snk ( PM): popcorn  Beverages: water, diet mt lion, milk  Usual physical activity: limited by his pain, transfers from wheel chair scooter to chair a few times a day Estimated daily energy needs for weight loss: 1700-2100 calories  140-180 g carbohydrates 145-170 g protein 65-70 g fat  Progress Towards Goal(s):  Some progress.    Nutritional Diagnosis:  NB-1.1 Food and nutrition-related knowledge deficit As related to lack of sufficient training on label reading and nutrition is very much improved from previous visit As evidenced by he and his  brothers questions and report. .    Intervention:  Nutrition education for patient and his brother on label reading, encouraged healthy fats, portion sizes using food models and encouragement for progress made and continued progress.  Coordination of care-Recommend stopping glyburide Handouts given during visit include: meal plan to show him how to achieve protein needs Barriers to learning/adherence to lifestyle change: multiple life changes at one time- smoking, weight loss, however both are very supportive of each other and asking for assistance as needed so feel they will be successful.  Demonstrated degree of understanding via:  Teach Back   Monitoring/Evaluation:  Dietary intake, exercise, blood glucose meter, and body weight prn. Recommend monthly to every other month check in to assure progress and address plateaus.

## 2014-05-30 NOTE — Patient Instructions (Signed)
You are doing really well.   Keep trying to find exercise that works for you!  Back Exercises Back exercises help treat and prevent back injuries. The goal is to increase your strength in your belly (abdominal) and back muscles. These exercises can also help with flexibility. Start these exercises when told by your doctor. HOME CARE Back exercises include: Pelvic Tilt.  Lie on your back with your knees bent. Tilt your pelvis until the lower part of your back is against the floor. Hold this position 5 to 10 sec. Repeat this exercise 5 to 10 times. Knee to Chest.  Pull 1 knee up against your chest and hold for 20 to 30 seconds. Repeat this with the other knee. This may be done with the other leg straight or bent, whichever feels better. Then, pull both knees up against your chest. Sit-Ups or Curl-Ups.  Bend your knees 90 degrees. Start with tilting your pelvis, and do a partial, slow sit-up. Only lift your upper half 30 to 45 degrees off the floor. Take at least 2 to 3 seonds for each sit-up. Do not do sit-ups with your knees out straight. If partial sit-ups are difficult, simply do the above but with only tightening your belly (abdominal) muscles and holding it as told. Hip-Lift.  Lie on your back with your knees flexed 90 degrees. Push down with your feet and shoulders as you raise your hips 2 inches off the floor. Hold for 10 seconds, repeat 5 to 10 times. Back Arches.  Lie on your stomach. Prop yourself up on bent elbows. Slowly press on your hands, causing an arch in your low back. Repeat 3 to 5 times. Shoulder-Lifts.  Lie face down with arms beside your body. Keep hips and belly pressed to floor as you slowly lift your head and shoulders off the floor. Do not overdo your exercises. Be careful in the beginning. Exercises may cause you some mild back discomfort. If the pain lasts for more than 15 minutes, stop the exercises until you see your doctor. Improvement with exercise for back  problems is slow.

## 2014-06-06 ENCOUNTER — Other Ambulatory Visit: Payer: Self-pay | Admitting: *Deleted

## 2014-06-06 DIAGNOSIS — F418 Other specified anxiety disorders: Secondary | ICD-10-CM

## 2014-06-06 MED ORDER — ALPRAZOLAM 0.5 MG PO TABS: 0.5000 mg | ORAL_TABLET | Freq: Two times a day (BID) | ORAL | Status: DC | PRN

## 2014-06-06 NOTE — Telephone Encounter (Signed)
Rx called in 

## 2014-06-06 NOTE — Telephone Encounter (Signed)
Pls phone in. Appt in 3 days with me

## 2014-06-09 ENCOUNTER — Ambulatory Visit: Payer: Medicaid Other | Admitting: Dietician

## 2014-06-09 ENCOUNTER — Ambulatory Visit (INDEPENDENT_AMBULATORY_CARE_PROVIDER_SITE_OTHER): Payer: Medicaid Other | Admitting: Internal Medicine

## 2014-06-09 ENCOUNTER — Encounter: Payer: Self-pay | Admitting: Internal Medicine

## 2014-06-09 VITALS — BP 133/91 | HR 97 | Temp 97.0°F | Wt 292.0 lb

## 2014-06-09 DIAGNOSIS — Z0000 Encounter for general adult medical examination without abnormal findings: Secondary | ICD-10-CM

## 2014-06-09 DIAGNOSIS — F418 Other specified anxiety disorders: Secondary | ICD-10-CM

## 2014-06-09 DIAGNOSIS — G894 Chronic pain syndrome: Secondary | ICD-10-CM

## 2014-06-09 DIAGNOSIS — M069 Rheumatoid arthritis, unspecified: Secondary | ICD-10-CM

## 2014-06-09 DIAGNOSIS — E785 Hyperlipidemia, unspecified: Secondary | ICD-10-CM

## 2014-06-09 DIAGNOSIS — K7581 Nonalcoholic steatohepatitis (NASH): Secondary | ICD-10-CM

## 2014-06-09 DIAGNOSIS — Z720 Tobacco use: Secondary | ICD-10-CM

## 2014-06-09 DIAGNOSIS — L989 Disorder of the skin and subcutaneous tissue, unspecified: Secondary | ICD-10-CM

## 2014-06-09 DIAGNOSIS — F17200 Nicotine dependence, unspecified, uncomplicated: Secondary | ICD-10-CM

## 2014-06-09 DIAGNOSIS — I10 Essential (primary) hypertension: Secondary | ICD-10-CM

## 2014-06-09 DIAGNOSIS — E119 Type 2 diabetes mellitus without complications: Secondary | ICD-10-CM

## 2014-06-09 DIAGNOSIS — J410 Simple chronic bronchitis: Secondary | ICD-10-CM

## 2014-06-09 LAB — POCT GLYCOSYLATED HEMOGLOBIN (HGB A1C): Hemoglobin A1C: 5.6

## 2014-06-09 LAB — GLUCOSE, CAPILLARY: Glucose-Capillary: 108 mg/dL — ABNORMAL HIGH (ref 70–99)

## 2014-06-09 MED ORDER — HYDROCODONE-ACETAMINOPHEN 10-325 MG PO TABS: 1.0000 | ORAL_TABLET | Freq: Four times a day (QID) | ORAL | Status: DC | PRN

## 2014-06-09 MED ORDER — SIMVASTATIN 40 MG PO TABS: 40.0000 mg | ORAL_TABLET | Freq: Every day | ORAL | Status: DC

## 2014-06-09 NOTE — Assessment & Plan Note (Signed)
Had arthrocentesis R knee Dr Trudie Reed. Need to get records to see if looked for crystals.

## 2014-06-09 NOTE — Assessment & Plan Note (Signed)
Doing very, very well with weight loss. Working closely with Butch Penny.

## 2014-06-09 NOTE — Assessment & Plan Note (Signed)
Pt states rheum checked LFT's.. Will get records.

## 2014-06-09 NOTE — Assessment & Plan Note (Signed)
Presumed COPD but no PFT's yet bc other more pressing issues. Using Spriva QD and alb 2-3 times monthly.

## 2014-06-09 NOTE — Assessment & Plan Note (Signed)
Lab Results  Component Value Date   HGBA1C 5.6 06/09/2014   A1C down from 6 to 5.6. Gets dizzy sometimes when rolls over in bed but no clear cut hypoglycemia sxs. Brought meter but only 3 CBG's. On metformin 1000 BID and glyburide 5 QD. Eating very healthy and loosing weight consistently. Great motivation.  PLAN Microalbumin Stop glyburide Cont metformin - plan to leave on for metabolic syndrome / weight RTC 3 months CBG at home really not needed Cont to work with Butch Penny

## 2014-06-09 NOTE — Patient Instructions (Signed)
1. Great job on diet and losing weight 2. Stop Glyburide (a sugar pill) 3. Continue to check your blood pressure. Goal is less than 140/90 4. I gave you three paper copies of your pain pill. Give all three to the pharmacist 5. Ask Dr Trudie Reed to look at your L ankle to see if fluid needs to be taken off 6. I referred you to dermatology and back to Dr Carlean Purl

## 2014-06-09 NOTE — Assessment & Plan Note (Signed)
Xanax 0.5 TID PRN decreased to #55 last appt. Sometimes has left overs, sometimes not. Stress from dogs in house and kids in home. Also anxious when in public. No concern for misuse / abuse. Last UDS appropriate.Cont #55.

## 2014-06-09 NOTE — Assessment & Plan Note (Signed)
See chronic pain A&P

## 2014-06-09 NOTE — Assessment & Plan Note (Signed)
Nicotine patches did not work so we tried chantix. He states helping but still smoking 1/2 PPD 2/2 stress in home. Therefore, will stop Chantix until home environment gets better and then retry.

## 2014-06-09 NOTE — Assessment & Plan Note (Signed)
To make appt GI Carlean Purl to discuss colonoscopy.

## 2014-06-09 NOTE — Assessment & Plan Note (Signed)
On Zocor 40. LDL 47. Trig up a bit at 164 but decreasing. Might be able to get off statin in future.

## 2014-06-09 NOTE — Assessment & Plan Note (Addendum)
Doing mod well on Hydrocodone 10 #120 per month. [place] UDS appropriate. [place] database appropriate. No concern about misuse/abuse. Will refill and gave 3 paper copies to pt.  R knee pain gone after arthrocentesis. R foot pain better. Now with L ankle pain - anterior ankle joint. No trauma. Gets tingling / pulsation pain that lasts a second or two. Mostly when standing / walking or ROM exercises. No swelling. On exam, not clear if joint effusion. Have asked pt to ask Dr Trudie Reed to look at joint at appt later this month.  For his RA, he remains on prednisone 15 and reports it will be tapered down. Embrel. Leflunomide was recently added a 20 mg QD. Med list updated.

## 2014-06-09 NOTE — Assessment & Plan Note (Addendum)
Off lisinopril 10. BP at home 120's / high 89's to low 90's. Had been very low when on lisinopril bc of weight loss.  BP Readings from Last 3 Encounters:  06/09/14 139/94  03/14/14 123/79  01/27/14 125/86  \  PLAN Not at goal but no changes bc was hypotensive on lisinopril 10 Cont to watch BP off lisinopril Microalb

## 2014-06-09 NOTE — Progress Notes (Signed)
   Subjective:    Patient ID: Andrew Fowler, male    DOB: April 08, 1960, 54 y.o.   MRN: [id number]  HPI  Please see the A&P for the status of the pt's chronic medical problems.    Review of Systems  Constitutional: Negative for activity change, appetite change and unexpected weight change.  Respiratory: Positive for shortness of breath.   Genitourinary:       + twinge in kidney area once   Musculoskeletal: Positive for myalgias, arthralgias and gait problem. Negative for joint swelling.  Skin:       + lesions L arm and L face  Neurological: Positive for dizziness.  Psychiatric/Behavioral: The patient is nervous/anxious.        Objective:   Physical Exam  Constitutional: He is oriented to person, place, and time. He appears well-developed and well-nourished. No distress.  HENT:  Head: Normocephalic and atraumatic.  Right Ear: External ear normal.  Left Ear: External ear normal.  Nose: Nose normal.  Eyes: EOM are normal. Right eye exhibits no discharge. Left eye exhibits no discharge. No scleral icterus.  Injected conjunctiva  Cardiovascular:  + DP pulse L  Pulmonary/Chest: Effort normal.  Some auditory coarse BS without sthethoscope  Musculoskeletal: He exhibits no edema or tenderness.  Not clear if L ankle effusion. Good ROM. Non tender to palp.  Neurological: He is alert and oriented to person, place, and time.  Skin: Skin is warm and dry. He is not diaphoretic.  Small raised lesion L arm with some dried skin and erythema and scratch marks. Small raised lesion L face. + toenail fungus.   Psychiatric: He has a normal mood and affect. His behavior is normal. Judgment and thought content normal.          Assessment & Plan:

## 2014-06-10 LAB — MICROALBUMIN / CREATININE URINE RATIO
Creatinine, Urine: 183.1 mg/dL
Microalb Creat Ratio: 23.5 mg/g (ref 0.0–30.0)
Microalb, Ur: 4.3 mg/dL — ABNORMAL HIGH (ref <2.0)

## 2014-06-30 ENCOUNTER — Telehealth: Payer: Self-pay | Admitting: Dietician

## 2014-07-01 ENCOUNTER — Telehealth: Payer: Self-pay | Admitting: Dietician

## 2014-07-01 MED ORDER — NICOTINE 21 MG/24HR TD PT24: 21.0000 mg | MEDICATED_PATCH | TRANSDERMAL | Status: DC

## 2014-07-01 NOTE — Telephone Encounter (Signed)
I sent in 21 mg dose. He is to start it on his quit date and use for 6 weeks then call in for lower dose. If he is unable to quit smoking cigs on the patch, he should not cont to use the patch and make an appt so that we could talk about alternative tx or combo tc. 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  I called pt and informed him of above.

## 2014-07-01 NOTE — Telephone Encounter (Signed)
Asked about protein powder being as good as eating a food/meals with protein? Left message that food is usually the best option, but for convenience could use protein powder. He says he is getting about 120-130 grams protein per day and it is getting expensive. They are losing about 1.5 to 2# a month right now.

## 2014-07-01 NOTE — Telephone Encounter (Signed)
On call with patient he asked for Rx for nicotine patch.

## 2014-07-04 NOTE — Telephone Encounter (Signed)
Entered in error

## 2014-08-05 ENCOUNTER — Telehealth: Payer: Self-pay | Admitting: Dietician

## 2014-08-05 NOTE — Telephone Encounter (Signed)
Wants to know about protein in eating egg with or without egg yolk - does he get the same protein? Answered his question he sayus both he abn dhis brother are feeling good, feel like the increase in protein has helped muscle and hunger and are still decreasing their weight.  He says his weight is 281# and his brother is ~252#.

## 2014-08-09 ENCOUNTER — Encounter: Payer: Self-pay | Admitting: *Deleted

## 2014-09-01 ENCOUNTER — Telehealth: Payer: Self-pay | Admitting: *Deleted

## 2014-09-01 ENCOUNTER — Ambulatory Visit (INDEPENDENT_AMBULATORY_CARE_PROVIDER_SITE_OTHER): Payer: Medicaid Other | Admitting: Internal Medicine

## 2014-09-01 ENCOUNTER — Encounter: Payer: Medicaid Other | Admitting: Internal Medicine

## 2014-09-01 ENCOUNTER — Encounter: Payer: Self-pay | Admitting: Internal Medicine

## 2014-09-01 ENCOUNTER — Other Ambulatory Visit: Payer: Self-pay | Admitting: Internal Medicine

## 2014-09-01 VITALS — BP 103/68 | HR 106 | Temp 97.0°F | Wt 280.5 lb

## 2014-09-01 DIAGNOSIS — F17200 Nicotine dependence, unspecified, uncomplicated: Secondary | ICD-10-CM

## 2014-09-01 DIAGNOSIS — E785 Hyperlipidemia, unspecified: Secondary | ICD-10-CM | POA: Diagnosis not present

## 2014-09-01 DIAGNOSIS — E119 Type 2 diabetes mellitus without complications: Secondary | ICD-10-CM

## 2014-09-01 DIAGNOSIS — J410 Simple chronic bronchitis: Secondary | ICD-10-CM

## 2014-09-01 DIAGNOSIS — M069 Rheumatoid arthritis, unspecified: Secondary | ICD-10-CM | POA: Diagnosis not present

## 2014-09-01 DIAGNOSIS — K7581 Nonalcoholic steatohepatitis (NASH): Secondary | ICD-10-CM

## 2014-09-01 DIAGNOSIS — F418 Other specified anxiety disorders: Secondary | ICD-10-CM

## 2014-09-01 DIAGNOSIS — E66813 Obesity, class 3: Secondary | ICD-10-CM

## 2014-09-01 DIAGNOSIS — G894 Chronic pain syndrome: Secondary | ICD-10-CM | POA: Diagnosis present

## 2014-09-01 DIAGNOSIS — J449 Chronic obstructive pulmonary disease, unspecified: Secondary | ICD-10-CM | POA: Diagnosis not present

## 2014-09-01 DIAGNOSIS — I10 Essential (primary) hypertension: Secondary | ICD-10-CM | POA: Diagnosis not present

## 2014-09-01 DIAGNOSIS — Z0000 Encounter for general adult medical examination without abnormal findings: Secondary | ICD-10-CM

## 2014-09-01 LAB — POCT GLYCOSYLATED HEMOGLOBIN (HGB A1C): HEMOGLOBIN A1C: 6.7

## 2014-09-01 LAB — GLUCOSE, CAPILLARY: Glucose-Capillary: 129 mg/dL — ABNORMAL HIGH (ref 65–99)

## 2014-09-01 MED ORDER — HYDROCODONE-ACETAMINOPHEN 10-325 MG PO TABS: 1.0000 | ORAL_TABLET | Freq: Four times a day (QID) | ORAL | Status: DC | PRN

## 2014-09-01 MED ORDER — ALBUTEROL SULFATE HFA 108 (90 BASE) MCG/ACT IN AERS: 1.0000 | INHALATION_SPRAY | RESPIRATORY_TRACT | Status: DC | PRN

## 2014-09-01 MED ORDER — ALPRAZOLAM 0.5 MG PO TABS: 0.5000 mg | ORAL_TABLET | Freq: Two times a day (BID) | ORAL | Status: DC | PRN

## 2014-09-01 NOTE — Assessment & Plan Note (Signed)
Repeat BP great. No meds.  BP Readings from Last 3 Encounters:  09/01/14 103/68  06/09/14 133/91  03/14/14 123/79

## 2014-09-01 NOTE — Assessment & Plan Note (Addendum)
On simva 40 and two most recent LDL's were 33 & 47. We had talked about stopping this at some point since losing weight and eating healthy. Statins have other other benefits than just lowering the LDL. Has no CAD, CVA, known PVD so would be primary prevention. Will need to research further to see evidence for primary prevention in DM.  PLAN  Cont simva for now at 40 mg

## 2014-09-01 NOTE — Assessment & Plan Note (Signed)
Last appt, d/C glipizide bc A1C dropped to 5.6. Now just on metformin 1000 BID and A1C 6.7. No lows. Fasting 115 - 130. Highest was 156. Checks it QOD. He has lost 73 lbs over 2 yrs. Eating healthier.   PLAN Cont met 1000 BID Appt 3 months

## 2014-09-01 NOTE — Assessment & Plan Note (Signed)
He gets hydrocodone 10 #100 per 30 days. He has never had any red flags and I feel comfortable that he is benefiting from these meds and they allow him to function as evidenced by his impressive weight loss. The area that hurst the most are the L ankle but also the R knee, lower back, neck, and L hip. No falls. Is getting out of chair as much as he can and walking a bit. Movement exacerbates pain. Taking 3 hydro a day, 4 on bad days. No falls. [facility] [employer] [facility]. Medicaid only cover one PT appt per yr and I do not want to use it just for weight loss incase he needs it for something more serious later in the yr. Working at the [employer] is likely going to temp increase his pain. Therefore, I am OK with temp increasing # pills to 120. He know and agrees that the long term goal is to get off this med.  PLAN 1. Hydrocodone 10 #120 - 3 paper copies handed to pt 2. Workout at [employer] 3. Return in 3 months

## 2014-09-01 NOTE — Assessment & Plan Note (Signed)
Updated med list. On humira, leflunomide, and prednisone 12.5. On taper for prednisone.

## 2014-09-01 NOTE — Patient Instructions (Signed)
I gave you three paper copies of your hydrocodone I increased the number to 120 - temporary to cover your exercises Please send me any forms for the [facility] to fill out Stop the Spiriva inhaler Let me know if he dizziness gets worse - it is called Benign Positional Paroxysmal Vertigo Let me know when you want to get the breathing test. See me in three months

## 2014-09-01 NOTE — Assessment & Plan Note (Signed)
One yr ago we decreased Xanax from #60 to 45 and is doing Jenera with that. Occ has a few left over at end of month. Uses before coming to MD appt, when grandkids over, when dogs drive him crazy, or when nothing on TV and and gets frustrated. No red flags. Ultimate goal is to further decrease. My goal today is get him to [facility] which is a new place and is going to be anxiety provoking. Therefore, today is not day to address taper.  PLAN Xanax 0.5 #55 with 5 RF

## 2014-09-01 NOTE — Assessment & Plan Note (Signed)
He is losing weight and eating healthier. Reminded him to limit ETOH. Had 4 beers last night while watching game but first ETOH in 45 some days.

## 2014-09-01 NOTE — Telephone Encounter (Signed)
Xanax refill called into pt's pharmacy. Phone call complete.[name], Marlen Mollica Cassady6/9/201611:15 AM

## 2014-09-01 NOTE — Assessment & Plan Note (Signed)
Loosing weigt. To [facility] the [facility].

## 2014-09-01 NOTE — Assessment & Plan Note (Signed)
Not using the spiriva. Stop it. Using the alb 2 or 3 times a month. Want to quit smoking and then get PFT's   PLAN Alb PRN D/C spirivia PFT's when pt ready

## 2014-09-01 NOTE — Progress Notes (Signed)
   Subjective:    Patient ID: Andrew Fowler, male    DOB: 04-10-1960, 54 y.o.   MRN: [id number]  HPI  Andrew Fowler is here for pain follow up. Please see the A&P for the status of the pt's chronic medical problems.    Review of Systems  Constitutional: Negative for unexpected weight change.  Eyes: Positive for redness. Negative for pain.  Respiratory: Negative for shortness of breath.   Gastrointestinal:       Loose stools occ  Musculoskeletal: Positive for myalgias, back pain, arthralgias, gait problem and neck pain.  Skin:       Lesions on head  Neurological: Positive for dizziness.  Psychiatric/Behavioral: Negative for sleep disturbance. The patient is nervous/anxious.        Objective:   Physical Exam  Constitutional: He is oriented to person, place, and time. He appears well-developed and well-nourished. No distress.  HENT:  Head: Normocephalic and atraumatic.  Right Ear: External ear normal.  Left Ear: External ear normal.  Nose: Nose normal.  Eyes: EOM are normal.  Sclera injected B  Cardiovascular: Normal rate, regular rhythm and normal heart sounds.   Pulmonary/Chest: Effort normal and breath sounds normal.  Good air flow but some ins and exp wheezing   Neurological: He is alert and oriented to person, place, and time.  Skin: Skin is warm and dry. He is not diaphoretic.  2 wart like lesions about 3 mm on forehead. 1 cm lesion inside hairline R temple  Psychiatric: He has a normal mood and affect. His behavior is normal. Judgment and thought content normal.          Assessment & Plan:

## 2014-11-25 ENCOUNTER — Ambulatory Visit (INDEPENDENT_AMBULATORY_CARE_PROVIDER_SITE_OTHER): Payer: Medicaid Other | Admitting: Internal Medicine

## 2014-11-25 ENCOUNTER — Encounter: Payer: Self-pay | Admitting: Internal Medicine

## 2014-11-25 VITALS — BP 133/81 | HR 98 | Temp 97.4°F | Ht 70.0 in | Wt 270.8 lb

## 2014-11-25 DIAGNOSIS — Z6838 Body mass index (BMI) 38.0-38.9, adult: Secondary | ICD-10-CM | POA: Diagnosis not present

## 2014-11-25 DIAGNOSIS — Z79899 Other long term (current) drug therapy: Secondary | ICD-10-CM | POA: Diagnosis not present

## 2014-11-25 DIAGNOSIS — J449 Chronic obstructive pulmonary disease, unspecified: Secondary | ICD-10-CM

## 2014-11-25 DIAGNOSIS — M069 Rheumatoid arthritis, unspecified: Secondary | ICD-10-CM

## 2014-11-25 DIAGNOSIS — G894 Chronic pain syndrome: Secondary | ICD-10-CM | POA: Diagnosis not present

## 2014-11-25 DIAGNOSIS — E119 Type 2 diabetes mellitus without complications: Secondary | ICD-10-CM | POA: Diagnosis present

## 2014-11-25 DIAGNOSIS — Z7951 Long term (current) use of inhaled steroids: Secondary | ICD-10-CM | POA: Diagnosis not present

## 2014-11-25 DIAGNOSIS — Z7952 Long term (current) use of systemic steroids: Secondary | ICD-10-CM | POA: Diagnosis not present

## 2014-11-25 DIAGNOSIS — F17200 Nicotine dependence, unspecified, uncomplicated: Secondary | ICD-10-CM | POA: Diagnosis not present

## 2014-11-25 MED ORDER — ALBUTEROL SULFATE HFA 108 (90 BASE) MCG/ACT IN AERS: 1.0000 | INHALATION_SPRAY | RESPIRATORY_TRACT | Status: DC | PRN

## 2014-11-25 MED ORDER — HYDROCODONE-ACETAMINOPHEN 10-325 MG PO TABS: 1.0000 | ORAL_TABLET | Freq: Four times a day (QID) | ORAL | Status: DC | PRN

## 2014-11-25 MED ORDER — GLUCOSE BLOOD VI STRP: ORAL_STRIP | Status: DC

## 2014-11-25 NOTE — Progress Notes (Signed)
   Subjective:    Patient ID: Andrew Fowler, male    DOB: 01-Jan-1961, 54 y.o.   MRN: [id number]  HPI 54 y/o male with PMHx of HTN, HLD, DM, RA, chronic pain, and morbid obesity who presents to clinic for med refills and DM. Please see problem list for further details.      Review of Systems  Constitutional: Negative for activity change, appetite change and unexpected weight change.  Respiratory: Negative for shortness of breath.   Musculoskeletal: Positive for back pain (chronic unchanged).  All other systems reviewed and are negative.      Objective:   Physical Exam  Constitutional: He appears well-developed and well-nourished.  Cardiovascular: Normal rate and regular rhythm.   No murmur heard. Pulmonary/Chest: Effort normal. He has wheezes (rt lung fields).  Abdominal: Soft. Bowel sounds are normal. He exhibits distension. There is no tenderness.  Skin: Skin is warm and dry.  Psychiatric: He has a normal mood and affect. His behavior is normal.          Assessment & Plan:  Please see problem based assessment and plan.

## 2014-11-25 NOTE — Assessment & Plan Note (Signed)
Lab Results  Component Value Date   HGBA1C 6.7 09/01/2014   HGBA1C 5.6 06/09/2014   HGBA1C 6.0 12/23/2013     Assessment: Diabetes control:  controlled Progress toward A1C goal:   at goal Comments: on metformin 1000 mg BID also on chronic prednisone for RA. Brought in glucometer report which ranges from 93-116.  Plan: Medications:  continue current medications Home glucose monitoring: Frequency:   Timing:   Instruction/counseling given: discussed the need for weight loss and discussed diet Educational resources provided:   Self management tools provided:   Other plans: pt has lost 10lbs w/in 3 months. Counseled on diet and exercise. Can check a1c at next appt. On 9/15

## 2014-11-25 NOTE — Assessment & Plan Note (Signed)
Refilled albuterol inhaler. Pt denies SOB, has cut down on smoking.

## 2014-11-25 NOTE — Assessment & Plan Note (Signed)
Pain is unchanged and well controlled with current pain med regimen. Given 1 paper script for hydrocodone 10 #120 tabs.

## 2014-12-01 NOTE — Progress Notes (Signed)
Internal Medicine Clinic Attending  Case discussed with Dr. Truong at the time of the visit.  We reviewed the resident's history and exam and pertinent patient test results.  I agree with the assessment, diagnosis, and plan of care documented in the resident's note.  

## 2014-12-01 NOTE — Addendum Note (Signed)
Addended by: Lalla Brothers T on: 12/01/2014 09:26 AM   Modules accepted: Level of Service

## 2014-12-08 ENCOUNTER — Encounter: Payer: Medicaid Other | Admitting: Internal Medicine

## 2014-12-16 ENCOUNTER — Telehealth: Payer: Self-pay | Admitting: Dietician

## 2014-12-16 NOTE — Telephone Encounter (Signed)
Wants to know about fat intake: bad fats are bad for you but his question is about good fats, what is the advantage of getting the good fat if he is trying to stay under the level that is recommended. Answered patient's questions and encouraged him to get most of his fat intake from healthy fats. List of healthy fats mailed to patient.

## 2014-12-22 ENCOUNTER — Ambulatory Visit (INDEPENDENT_AMBULATORY_CARE_PROVIDER_SITE_OTHER): Payer: Medicaid Other | Admitting: Internal Medicine

## 2014-12-22 ENCOUNTER — Encounter: Payer: Self-pay | Admitting: Internal Medicine

## 2014-12-22 VITALS — BP 132/91 | HR 91 | Temp 97.8°F | Ht 71.0 in | Wt 265.9 lb

## 2014-12-22 DIAGNOSIS — F17210 Nicotine dependence, cigarettes, uncomplicated: Secondary | ICD-10-CM | POA: Diagnosis not present

## 2014-12-22 DIAGNOSIS — E785 Hyperlipidemia, unspecified: Secondary | ICD-10-CM

## 2014-12-22 DIAGNOSIS — Z23 Encounter for immunization: Secondary | ICD-10-CM

## 2014-12-22 DIAGNOSIS — M25561 Pain in right knee: Secondary | ICD-10-CM | POA: Diagnosis not present

## 2014-12-22 DIAGNOSIS — G8929 Other chronic pain: Secondary | ICD-10-CM | POA: Diagnosis not present

## 2014-12-22 DIAGNOSIS — J449 Chronic obstructive pulmonary disease, unspecified: Secondary | ICD-10-CM | POA: Diagnosis not present

## 2014-12-22 DIAGNOSIS — M542 Cervicalgia: Secondary | ICD-10-CM | POA: Diagnosis not present

## 2014-12-22 DIAGNOSIS — G894 Chronic pain syndrome: Secondary | ICD-10-CM

## 2014-12-22 DIAGNOSIS — M25572 Pain in left ankle and joints of left foot: Secondary | ICD-10-CM | POA: Diagnosis not present

## 2014-12-22 DIAGNOSIS — F418 Other specified anxiety disorders: Secondary | ICD-10-CM | POA: Diagnosis not present

## 2014-12-22 DIAGNOSIS — E119 Type 2 diabetes mellitus without complications: Secondary | ICD-10-CM

## 2014-12-22 DIAGNOSIS — I10 Essential (primary) hypertension: Secondary | ICD-10-CM | POA: Diagnosis not present

## 2014-12-22 DIAGNOSIS — Z1211 Encounter for screening for malignant neoplasm of colon: Secondary | ICD-10-CM

## 2014-12-22 DIAGNOSIS — M069 Rheumatoid arthritis, unspecified: Secondary | ICD-10-CM | POA: Diagnosis present

## 2014-12-22 DIAGNOSIS — Z0000 Encounter for general adult medical examination without abnormal findings: Secondary | ICD-10-CM

## 2014-12-22 DIAGNOSIS — F1120 Opioid dependence, uncomplicated: Secondary | ICD-10-CM

## 2014-12-22 DIAGNOSIS — R1011 Right upper quadrant pain: Secondary | ICD-10-CM | POA: Diagnosis not present

## 2014-12-22 DIAGNOSIS — J410 Simple chronic bronchitis: Secondary | ICD-10-CM

## 2014-12-22 LAB — POCT GLYCOSYLATED HEMOGLOBIN (HGB A1C): Hemoglobin A1C: 5.2

## 2014-12-22 LAB — GLUCOSE, CAPILLARY: Glucose-Capillary: 100 mg/dL — ABNORMAL HIGH (ref 65–99)

## 2014-12-22 MED ORDER — HYDROCODONE-ACETAMINOPHEN 10-325 MG PO TABS: 1.0000 | ORAL_TABLET | Freq: Four times a day (QID) | ORAL | Status: DC | PRN

## 2014-12-22 NOTE — Assessment & Plan Note (Signed)
Cont to see Dr Trudie Reed. On Humeria, Leflunomide 20, and predisone 5 (taper down). Gets "flares" without swelling but presence of discomfort as prednisone being tapered. If lasts > 1 hour, takes a hydrocodone. Has appt with her Oct 3rd.

## 2014-12-22 NOTE — Assessment & Plan Note (Signed)
BP Readings from Last 3 Encounters:  12/22/14 132/91  11/25/14 133/81  09/01/14 103/68   BP remains great (DBP 91 today fluke and didn't have time to repeat it). No meds. Cont to follow BP.

## 2014-12-22 NOTE — Progress Notes (Signed)
   Subjective:    Patient ID: [name], male    DOB: 02/24/1961, 54 y.o.   MRN: [id number]  HPI  JAVARES KAUFHOLD is here for pain F/U. Please see the A&P for the status of the pt's chronic medical problems.   Review of Systems  Constitutional: Negative for fever, appetite change and unexpected weight change.  Respiratory: Negative for cough and shortness of breath.   Cardiovascular: Negative for chest pain.  Gastrointestinal: Positive for abdominal pain. Negative for nausea, vomiting and diarrhea.  Musculoskeletal: Positive for arthralgias, gait problem and neck pain. Negative for joint swelling.  Skin: Negative for color change and wound.  Neurological: Positive for weakness. Negative for dizziness.  Psychiatric/Behavioral: Negative for behavioral problems. The patient is nervous/anxious.        Objective:   Physical Exam  Constitutional: He is oriented to person, place, and time. He appears well-developed and well-nourished. No distress.  HENT:  Head: Normocephalic and atraumatic.  Right Ear: External ear normal.  Left Ear: External ear normal.  Nose: Nose normal.  Eyes: EOM are normal.  Sclera injected B  Neck: Neck supple.  No muscle spasms on R posterior neck. No tenderness to palp  Pulmonary/Chest: Effort normal.  Sl audible resp wheeze  Abdominal: Soft. Bowel sounds are normal. There is no tenderness.  Had to exam in [place]. Unable to palpate deeply RUQ but no pain  Musculoskeletal: Normal range of motion. He exhibits no edema.  No R knee effusion, + crepitus, FROM  Neurological: He is alert and oriented to person, place, and time.  Skin: Skin is warm and dry. He is not diaphoretic.  Sl lichenification R LE  Psychiatric: He has a normal mood and affect. His behavior is normal. Judgment and thought content normal.          Assessment & Plan:

## 2014-12-22 NOTE — Assessment & Plan Note (Signed)
Gets Xanax 0.5 55 per month. Triggers are the 6 dogs in the house, two nieces who stay with their dad every other weekend (age 53 & 4), rude people in [employer] or [employer]. Usually 1-2 per day, on bad day takes 3. Will not taper further today. Will let him get through holidays and reassess.

## 2014-12-22 NOTE — Assessment & Plan Note (Signed)
Doesn't want colonoscopy bc freq trips to toilet difficult with ambulation issues. Does want stool cards and understands that if abnl, will need colon. Flu shot today

## 2014-12-22 NOTE — Assessment & Plan Note (Signed)
Lab Results  Component Value Date   HGBA1C 5.2 12/22/2014    A1C down from 6.7 to 5.2!! Checking CBG QOD. Highest is 102. No lows. Only on metformin 1000 BID. We discussed that with his weight loss and exercise and low A1C, I could titirate down the metformin but he requested to leave it at 1000 BID since I also explained might help with weight loss.   PLAN Cont metformin 1000 BID A1C 3 months All other items UTD

## 2014-12-22 NOTE — Assessment & Plan Note (Signed)
On simva 40. We discussed non lipid lowering benefits of statins and he wants to stay on. Will see if Dr Trudie Reed willing to add on fasting lipids to her blood draw.

## 2014-12-22 NOTE — Assessment & Plan Note (Signed)
Uses albuterol rarely - 2-3 times / month. Smoking 1 PPD and unable to stop despite chantix and NRT. No longer waking up at night to smoke. Trying not to smoke first thing in AM. Discussed that maybe now is not right time to try to stop - focus on exercise, diet, and pain control.

## 2014-12-22 NOTE — Patient Instructions (Signed)
I will let you know results of Xrays I am referring you to Sports Medicine I gave you three paper copies of your pain med

## 2014-12-22 NOTE — Assessment & Plan Note (Addendum)
Overall pain I had increased hydrocodone 10 to #120 last appt so that he could have extra now that he was planning on exercising. The exercising has not gone as well as he hoped 2/2 R knee and L ankle pain. He can only bike 3-5 min and was unable to walk on treadmill 2/2 legs shaking. He did use some weights at the gym but is able to do that at home anyway. After we get the newish R knee and L ankle pain sort, he will try to cont to exercise and move about home and lift weights at home. Therefore, I am leaving hydrocodone at #120 for now although he understands goal is to titrate down or off. On good day, he uses 3. On bad day, 4.   R knee and L ankle pain This is newish. Dr Trudie Reed aspirated the R knee and no crystals. Per pt, she thought was OA. This pain is preventing him from exercising like he needs to and wants to. The L ankle usually hurts at night - he moves in bed and it feels like it was "banged with a hammer."   R posterior neck pain Prior cervical MRI results in overview. Will defer to next appt.  RUQ pain  This has been for 1.5 months and is mostly 2-3 hrs after dinner when lying in bed. It is a constant dull pain that resolves if he gently rubs the area. It occurs 2-3 times per week. He no longer eats fried / fatty food so cannot say if that makes it worse. No injury. No rash. No cough, SOB, fever, CP. Exam limited 2/2 obesity and in WC. Last ABD imaging 2013 and no gallstones then. Now with 100 lb weight loss since feb 2014. Rapid weight loss is a risk factor although this might not qualify for rapid weight loss. Ursodeoxycholic acid (UDCA) is used after bariatric surgery for prevention and can be used to dissolve gallstones in non surgical pts but must have mild sxs, small stones, minimal calcification, and good GB function. Spoke to radiology - will start with RUQ U/S. If + and pt interested in [facility] - would need CT then.   PLAN  1. See if Dr Trudie Reed willing to check CMP 2. RUQ U/S 3. If  negative, R/O gall stones. If +, consider CT  4. Refilled hydrocodone #120 and handed 3 Rx to pt  5. Plain film of R knee and L ankle 6. Refer to sports med for R knee, L ankle to see if any non surgical tx options to se if he can get relief and increase exercise. 7. If OA R knee, try voltarin gel

## 2014-12-22 NOTE — Addendum Note (Signed)
Addended by: Forde Dandy on: 12/22/2014 10:43 AM   Modules accepted: Orders, Medications

## 2014-12-28 ENCOUNTER — Telehealth: Payer: Self-pay | Admitting: Dietician

## 2014-12-28 NOTE — Telephone Encounter (Signed)
Wants to know how much tuna per week he can have without getting too much mercury?

## 2014-12-29 NOTE — Telephone Encounter (Signed)
He is eating 4 ounces 7 days a week of light tuna Advised patient to limit light tuna to 2-3 meals a week or 20 oz per week.  Encouraged him to consider other fish that is low in mercury or sources of protein like poultry or tofu.   He reports his triglyceride was 202 on recent labs and asked what his previous value was and why it might be higher. Discussed normal values for triglycerides and that a possible reason for more triglyceride in his blood is his weight loss. Encouraged him to continue with weight loss and working on increased activity and discuss with doctors for additional questions or concerns. He has lost 65# and lowered his A1C from 6% to 5.2% since last tryglyceride was checked.   Mailed him recipes for low cost healthy protein,dietary tips on lowering triglycerides and information on fish that are low in mercury that he can eat more often.

## 2014-12-30 ENCOUNTER — Other Ambulatory Visit: Payer: Self-pay | Admitting: Internal Medicine

## 2014-12-30 DIAGNOSIS — R1011 Right upper quadrant pain: Secondary | ICD-10-CM

## 2015-01-04 ENCOUNTER — Ambulatory Visit: Payer: Medicaid Other | Admitting: Sports Medicine

## 2015-01-11 ENCOUNTER — Telehealth: Payer: Self-pay | Admitting: Licensed Clinical Social Worker

## 2015-01-11 ENCOUNTER — Encounter: Payer: Self-pay | Admitting: Licensed Clinical Social Worker

## 2015-01-11 NOTE — Telephone Encounter (Signed)
CSW placed call to Mr. Gurry to discuss DME order for step stool with a hand height handle to safely enter and exit tub.  Pt declines shower chair or tub transfer bench.  CSW discussed the private pay cost for this step stool with hand height handle, $30-$50 at medical supply store.  Pt declines to pay privately.  Pt interested in in-home exercise.  Mr. Fei has been referred to sports medicine.  CSW will mail pt information from [employer] [facility].

## 2015-01-13 ENCOUNTER — Ambulatory Visit (HOSPITAL_COMMUNITY): Payer: Medicaid Other

## 2015-01-16 ENCOUNTER — Other Ambulatory Visit (INDEPENDENT_AMBULATORY_CARE_PROVIDER_SITE_OTHER): Payer: Medicaid Other

## 2015-01-16 DIAGNOSIS — Z1211 Encounter for screening for malignant neoplasm of colon: Secondary | ICD-10-CM

## 2015-01-16 LAB — POC HEMOCCULT BLD/STL (HOME/3-CARD/SCREEN)
Card #2 Fecal Occult Blod, POC: NEGATIVE
Card #3 Fecal Occult Blood, POC: NEGATIVE
Fecal Occult Blood, POC: NEGATIVE

## 2015-01-17 ENCOUNTER — Ambulatory Visit (INDEPENDENT_AMBULATORY_CARE_PROVIDER_SITE_OTHER): Payer: Medicaid Other | Admitting: Sports Medicine

## 2015-01-17 ENCOUNTER — Encounter: Payer: Self-pay | Admitting: Sports Medicine

## 2015-01-17 VITALS — BP 112/70 | HR 97 | Ht 71.0 in | Wt 265.0 lb

## 2015-01-17 DIAGNOSIS — M25561 Pain in right knee: Secondary | ICD-10-CM

## 2015-01-17 DIAGNOSIS — M25572 Pain in left ankle and joints of left foot: Secondary | ICD-10-CM

## 2015-01-17 NOTE — Progress Notes (Signed)
   Subjective:    Patient ID: Andrew Fowler, male    DOB: [birth year], 54 y.o.   MRN: [id number]  HPI   54 y/o male with multiple medial problems, including RA presenting to sports medicine referral for RIGHT knee pain and LEFT ankle pain.  Pain has varied presentations, dull at rest and becomes sharp/stabbing like with ambulation, especially twisting.  Currently using a mechanized wheelchair about 80% of the time. He denies swelling in the knee or the ankle. Knee pain is present primarily along the medial knee. Does have a history of gout but states that his current symptoms are different in nature than what he experienced previously. He has had several previous cortisone injections into his right knee which were only slightly beneficial. In regards to the left ankle he has suffered numerous ankle sprains. Pain is primarily along the lateral ankle. No recent trauma.  Followed by Rheumatology (Dr. Lenna Gilford).    Does not want any surgical management of RIGHT knee and LEFT ankle.  He is seeking non-surgical management to improve pain, increase function.    Had a prior LEFT hip arthroplasty, complicated by a faulty device in 1997 and is very adverse to surgical interventions of any kind.    PMHx: -- Reviewed  PSHx: -- Reviewed, no other joint surgery other than hip described above  Meds: -- Reviewed and confirmed  Social Hx: -- Reviewed; current EtOH use (rare beer) and Smoker (1 ppd)    Review of Systems 10-point ROS was reviewed as above.      Objective:   Physical Exam  Gen: -- Sitting upright in wheelchair, in NAD Cardio/PUlm -- Non-labored respirations  Extremities: -- No edema or erythema  RIGHT knee exam: -- No swelling on inspection -- 5/5 strength in flexion/extension without pain.  AROM and PROM 0-130 degrees.  Significant palpable/audible crepitus with AROM/PROM.  Patellar tracking is appropriate.  -- Negative cruciate and collateral ligament testing.   -- TTP  along the medial > lateral joint line.  Unable to perform McMurrays given wheelchair bound status   LEFT ankle pain: -- No abnormalities on inspection -- 5/5 strength in inversion/eversion and dorsiflexion/plantar flexion -- Ankle AROM in dorsiflexion to 30 degrees, plantar flexion to 80 degree from neutral position. -- ++++ Laxity with talar tilt and anterior drawer. Pain at palpation of insertion of ATFL and CFL.  Patient reports remote history of ankle sprains, numerous. -- No pain with external rotation.  No pain with percussion of fibular head.      Assessment & Plan:   54 y/o male with complex PMHx (including rheumatoid arthritis) presenting for joint pain as new patient referral.  #1. RIGHT Knee pain Suspect OA given crepitus, history and weight.  Discussed risks benefits of injection therapy and patient declined.  Pursue conservative options.  Plan: -- Weight-bearing knee films today -- Continue supportive care -- F/u in 2 weeks to review films, discuss other injection alternatives aside from CSI such as viscosupplementation and physical rehab   #2. LEFT ankle pain Suspect pain generator is secondary to ligamentous laxity/remote history of numerous sprains.  Adverse to surgical outcomes, no recent imaging of ankle.  Plan: -- Ankle films today -- f/u inj 2 weeks as above, discuss x-ray -- Ankle bracing for stability during his limited weight bearing during the day -- Suspect physical therapy referral for above    ===

## 2015-01-26 ENCOUNTER — Ambulatory Visit: Payer: Medicaid Other | Admitting: Internal Medicine

## 2015-01-30 ENCOUNTER — Ambulatory Visit (HOSPITAL_COMMUNITY): Payer: Medicaid Other

## 2015-02-01 ENCOUNTER — Ambulatory Visit
Admission: RE | Admit: 2015-02-01 | Discharge: 2015-02-01 | Disposition: A | Discharge status: Home / Self Care | Payer: Medicaid Other | Source: Ambulatory Visit | Attending: Sports Medicine | Admitting: Sports Medicine

## 2015-02-01 DIAGNOSIS — M25572 Pain in left ankle and joints of left foot: Secondary | ICD-10-CM

## 2015-02-01 DIAGNOSIS — M25561 Pain in right knee: Secondary | ICD-10-CM

## 2015-02-06 ENCOUNTER — Ambulatory Visit (HOSPITAL_COMMUNITY): Payer: Medicaid Other

## 2015-02-07 ENCOUNTER — Ambulatory Visit: Payer: Medicaid Other | Admitting: Sports Medicine

## 2015-02-14 ENCOUNTER — Other Ambulatory Visit: Payer: Self-pay | Admitting: Internal Medicine

## 2015-02-14 DIAGNOSIS — F418 Other specified anxiety disorders: Secondary | ICD-10-CM

## 2015-02-14 NOTE — Telephone Encounter (Signed)
Last refill processed 10/29, per pharmacy this was the last one

## 2015-02-14 NOTE — Telephone Encounter (Signed)
Got 6 month supply June. Does he not have one more refill?

## 2015-02-14 NOTE — Telephone Encounter (Signed)
I thought the same, pharmacy says no.

## 2015-02-14 NOTE — Telephone Encounter (Signed)
Pt requesting xanax to be filled.

## 2015-02-15 ENCOUNTER — Other Ambulatory Visit: Payer: Self-pay | Admitting: Internal Medicine

## 2015-02-15 MED ORDER — ALPRAZOLAM 0.5 MG PO TABS: 0.5000 mg | ORAL_TABLET | Freq: Two times a day (BID) | ORAL | Status: DC | PRN

## 2015-02-15 NOTE — Telephone Encounter (Signed)
Got 6 months on 6/7. They were filled on 6/9 7/7 8/4 9/2 9/29 10/29  Used a bit more in Sept. Otherwise, dates seem to be every 30 days or so. I have little concern. Agreed to slow taper. With holidays, might need a few more. Will fill

## 2015-02-15 NOTE — Assessment & Plan Note (Signed)
Got 6 months on 6/7. They were filled on 6/9 7/7 8/4 9/2 9/29 10/29  Used a bit more in Sept. Otherwise, dates seem to be every 30 days or so. I have little concern. Agreed to slow taper. With holidays, might need a few more. Will fill

## 2015-02-15 NOTE — Telephone Encounter (Signed)
rx phoned in

## 2015-02-27 ENCOUNTER — Ambulatory Visit (INDEPENDENT_AMBULATORY_CARE_PROVIDER_SITE_OTHER): Payer: Medicaid Other | Admitting: Sports Medicine

## 2015-02-27 ENCOUNTER — Encounter: Payer: Self-pay | Admitting: Sports Medicine

## 2015-02-27 VITALS — BP 110/80 | Ht 71.0 in | Wt 254.0 lb

## 2015-02-27 DIAGNOSIS — M25561 Pain in right knee: Secondary | ICD-10-CM

## 2015-02-27 DIAGNOSIS — M1711 Unilateral primary osteoarthritis, right knee: Secondary | ICD-10-CM

## 2015-02-27 DIAGNOSIS — M25572 Pain in left ankle and joints of left foot: Secondary | ICD-10-CM | POA: Diagnosis not present

## 2015-02-27 MED ORDER — METHYLPREDNISOLONE ACETATE 40 MG/ML IJ SUSP
40.0000 mg | Freq: Once | INTRAMUSCULAR | Status: AC
  Administered 2015-02-27: 40 mg via INTRA_ARTICULAR

## 2015-02-27 NOTE — Progress Notes (Signed)
Patient ID: Andrew Fowler, male   DOB: 1961-02-10, 54 y.o.   MRN: [id number]  Patient comes in today for follow-up on chronic right knee and left ankle pain. X-rays of both areas were recently obtained. He has moderately advanced DJD in the right knee and some very mild DJD in the left ankle. Nothing acute. He does find the ASO brace helpful for his left ankle. His right knee pain is quite severe today. No current swelling. Well-documented history of rheumatoid arthritis and multiple knee injections/aspirations.  Physical exam was not repeated. We simply talked about his ongoing chronic conditions. I definately think his rheumatoid arthritis is playing a role in some of his symptoms. He is a well-established patient of Dr.Hawkes and is scheduled to follow-up with her again early next month. I think he might benefit from some physical therapy and he is concerned about that aggravating his pain. I've asked him to discuss this with Dr.Hawkes and let me know if she has any objections. If not, I will make the referral for him. In the meantime I have agreed to reinject his right knee. He will follow-up with me as needed.  Consent obtained and verified. Time-out conducted. Noted no overlying erythema, induration, or other signs of local infection. Skin prepped in a sterile fashion. Topical analgesic spray: Ethyl chloride. Joint: right knee, anterior medial approach Needle: 22g 1.5 inch Completed without difficulty. Meds: 3cc 1% xylocaine, 1cc (20m) depomedrol  Advised to call if fevers/chills, erythema, induration, drainage, or persistent bleeding.

## 2015-03-09 ENCOUNTER — Ambulatory Visit (INDEPENDENT_AMBULATORY_CARE_PROVIDER_SITE_OTHER): Payer: Medicaid Other | Admitting: Internal Medicine

## 2015-03-09 ENCOUNTER — Encounter: Payer: Self-pay | Admitting: Internal Medicine

## 2015-03-09 VITALS — BP 149/93 | HR 97 | Temp 98.2°F | Wt 256.7 lb

## 2015-03-09 DIAGNOSIS — E669 Obesity, unspecified: Secondary | ICD-10-CM | POA: Diagnosis not present

## 2015-03-09 DIAGNOSIS — Z0000 Encounter for general adult medical examination without abnormal findings: Secondary | ICD-10-CM

## 2015-03-09 DIAGNOSIS — E119 Type 2 diabetes mellitus without complications: Secondary | ICD-10-CM | POA: Diagnosis not present

## 2015-03-09 DIAGNOSIS — Z6835 Body mass index (BMI) 35.0-35.9, adult: Secondary | ICD-10-CM | POA: Diagnosis not present

## 2015-03-09 DIAGNOSIS — E785 Hyperlipidemia, unspecified: Secondary | ICD-10-CM | POA: Diagnosis not present

## 2015-03-09 DIAGNOSIS — M069 Rheumatoid arthritis, unspecified: Secondary | ICD-10-CM

## 2015-03-09 DIAGNOSIS — Z79891 Long term (current) use of opiate analgesic: Secondary | ICD-10-CM

## 2015-03-09 DIAGNOSIS — Z79899 Other long term (current) drug therapy: Secondary | ICD-10-CM | POA: Diagnosis not present

## 2015-03-09 DIAGNOSIS — G894 Chronic pain syndrome: Secondary | ICD-10-CM

## 2015-03-09 DIAGNOSIS — Z7984 Long term (current) use of oral hypoglycemic drugs: Secondary | ICD-10-CM

## 2015-03-09 DIAGNOSIS — I10 Essential (primary) hypertension: Secondary | ICD-10-CM

## 2015-03-09 DIAGNOSIS — F1120 Opioid dependence, uncomplicated: Secondary | ICD-10-CM

## 2015-03-09 DIAGNOSIS — F418 Other specified anxiety disorders: Secondary | ICD-10-CM

## 2015-03-09 MED ORDER — HYDROCODONE-ACETAMINOPHEN 10-325 MG PO TABS: 1.0000 | ORAL_TABLET | Freq: Four times a day (QID) | ORAL | Status: DC | PRN

## 2015-03-09 NOTE — Assessment & Plan Note (Signed)
He remains on simva 40. Last LDL 47 Oct 2015. Will not repeat today  A/P : cont simva

## 2015-03-09 NOTE — Assessment & Plan Note (Signed)
He asked about having lesion R forehead removed. Derm had looked at it June (I reviewed notes) and stated it was cosmetic and medicaid would not pay for removal. For now, I am calling it a wart - does not look like SQ or SSC.  A/P : removal in March

## 2015-03-09 NOTE — Progress Notes (Signed)
   Subjective:    Patient ID: Andrew Fowler, male    DOB: 25-Jan-1961, 54 y.o.   MRN: [id number]  HPI  Andrew Fowler is here for pain F/U. Please see the A&P for the status of the pt's chronic medical problems.  PMHx, Soc hx, and / or Fam hx : Obesity in brother. Trying to exercise at home and hhe has made several successful changes to diet.   Review of Systems  Constitutional: Negative for fatigue and unexpected weight change.  Gastrointestinal: Negative for abdominal pain and constipation.  Musculoskeletal: Positive for back pain, joint swelling, arthralgias and gait problem.  Skin:       Lesion on forehead Cysts L hand       Objective:   Physical Exam  Constitutional: He is oriented to person, place, and time. He appears well-developed and well-nourished. No distress.  HENT:  Head: Atraumatic.  Right Ear: External ear normal.  Left Ear: External ear normal.  Nose: Nose normal.  Eyes: Conjunctivae and EOM are normal.  Pulmonary/Chest: Effort normal.  Neurological: He is alert and oriented to person, place, and time.  Skin: Skin is warm and dry. He is not diaphoretic.  R forehead flesh coloured raised lesion with numerous tiny horns  Psychiatric: He has a normal mood and affect. His behavior is normal. Judgment and thought content normal.          Assessment & Plan:

## 2015-03-09 NOTE — Assessment & Plan Note (Signed)
He remains on Humira, leflunomide, and prednisone 5 mg. States Dr Trudie Reed doesn't think she can get him off prednisone.  A/P : Review Dr Trudie Reed' notes

## 2015-03-09 NOTE — Assessment & Plan Note (Signed)
Most recent A1C was 5.2. He stayed on metformin 1000 BIT bc we discussed it also helped with weight loss. Today he wants to decrease it to 1000 QD. Checks CBG infreq.  A/P : decrease metformin 1000 QD. A1C next appt

## 2015-03-09 NOTE — Assessment & Plan Note (Signed)
On no meds after weight loss

## 2015-03-09 NOTE — Patient Instructions (Signed)
I gave you three paper copies of your pain med Will start to cut down on pain med in Spring See me in March Will look into lesion removal in March

## 2015-03-09 NOTE — Assessment & Plan Note (Addendum)
I had increased quantity from 100 to 120 of his hydrocodone 10 bc he was going to try to go to gym. He has not been able to go to gym like he planned - unable to do the weight bearing exercises likes he planned 2/2 knee and ankle pain, can do free weights at home, ... He remembers that the increase was to cover more exercises and just temporary. He is exercising at home - 2 lb free weight for UE and doing LE exercises in bed. Was unable to get step stool to do stepping exercises. Today, we decided that we would leave at 120 to cover home exercises and cold weather and taper in Spring. 3 on good day, 4.5 on bad day.  MSO4 equivalents about 30-40 mg daily. No red flags. UDS appropriate previously.   Benefits (exercise to loose weight, move more freely, less pain so less irritable and had been able to titrate down benzo a bit) outweigh risks  A/P : cont hydrocodone 10 #120 per month. Taper in spring.

## 2015-03-09 NOTE — Assessment & Plan Note (Signed)
Wt Readings from Last 3 Encounters:  03/09/15 256 lb 11.2 oz (116.438 kg)  02/27/15 254 lb (115.214 kg)  01/17/15 265 lb (120.203 kg)    Weight loss cessation - likely 2/2 holidays. Overall 70 lb weight loss.  A/P : cont to follow.

## 2015-05-29 ENCOUNTER — Other Ambulatory Visit: Payer: Self-pay | Admitting: Internal Medicine

## 2015-05-29 ENCOUNTER — Other Ambulatory Visit: Payer: Self-pay | Admitting: Pharmacist

## 2015-05-29 DIAGNOSIS — F1120 Opioid dependence, uncomplicated: Secondary | ICD-10-CM

## 2015-05-29 DIAGNOSIS — G894 Chronic pain syndrome: Secondary | ICD-10-CM

## 2015-05-29 MED ORDER — HYDROCODONE-ACETAMINOPHEN 10-325 MG PO TABS: 1.0000 | ORAL_TABLET | Freq: Four times a day (QID) | ORAL | Status: DC | PRN

## 2015-05-29 MED ORDER — NALOXONE HCL 4 MG/0.1ML NA LIQD: 1.0000 | Freq: Once | NASAL | Status: DC

## 2015-05-29 NOTE — Progress Notes (Signed)
Naloxone access per PCP approval/request (Dr. Lynnae January). Will coordinate education with patient/family.

## 2015-05-30 MED FILL — NARCAN 4 MG NASAL SPRAY: 4 | 7 days supply | Qty: 2 | Fill #0

## 2015-06-05 ENCOUNTER — Encounter: Payer: Self-pay | Admitting: Internal Medicine

## 2015-06-05 ENCOUNTER — Ambulatory Visit (INDEPENDENT_AMBULATORY_CARE_PROVIDER_SITE_OTHER): Payer: Medicaid Other | Admitting: Internal Medicine

## 2015-06-05 VITALS — BP 108/71 | HR 51 | Temp 97.3°F | Ht 71.0 in | Wt 246.0 lb

## 2015-06-05 DIAGNOSIS — L918 Other hypertrophic disorders of the skin: Secondary | ICD-10-CM | POA: Diagnosis not present

## 2015-06-05 DIAGNOSIS — L988 Other specified disorders of the skin and subcutaneous tissue: Secondary | ICD-10-CM | POA: Diagnosis not present

## 2015-06-05 DIAGNOSIS — E119 Type 2 diabetes mellitus without complications: Secondary | ICD-10-CM

## 2015-06-05 DIAGNOSIS — B078 Other viral warts: Secondary | ICD-10-CM

## 2015-06-05 DIAGNOSIS — B079 Viral wart, unspecified: Secondary | ICD-10-CM

## 2015-06-05 DIAGNOSIS — L720 Epidermal cyst: Secondary | ICD-10-CM

## 2015-06-05 DIAGNOSIS — L738 Other specified follicular disorders: Secondary | ICD-10-CM | POA: Diagnosis present

## 2015-06-05 DIAGNOSIS — E1121 Type 2 diabetes mellitus with diabetic nephropathy: Secondary | ICD-10-CM

## 2015-06-05 LAB — POCT GLYCOSYLATED HEMOGLOBIN (HGB A1C): Hemoglobin A1C: 5.4

## 2015-06-05 LAB — GLUCOSE, CAPILLARY: Glucose-Capillary: 112 mg/dL — ABNORMAL HIGH (ref 65–99)

## 2015-06-05 MED ORDER — TRETINOIN 0.025 % EX CREA: TOPICAL_CREAM | Freq: Every day | CUTANEOUS | Status: DC

## 2015-06-05 NOTE — Progress Notes (Signed)
Patient ID: Andrew Fowler, male   DOB: [birth year], 55 y.o.   MRN: [id number] [facility] Subjective:   Patient ID: Andrew Fowler male   DOB: June 15, 1960 55 y.o.   MRN: [id number]  HPI: [name] is a 55 y.o. male here for evaluation of several skin lesions.  He continues to smoke and has lost 120 pounds over the last 3 years by diet alone.  Please see the assessment and plan for the status of the patient's chronic medical problems.  Review of Systems  Constitutional: Negative for fever and chills.  Skin: Positive for rash. Negative for itching.  Neurological: Negative for headaches.   Objective:  Physical Exam: Filed Vitals:   06/05/15 1514  BP: 108/71  Pulse: 51  Temp: 97.3 F (36.3 C)  TempSrc: Oral  Height: 5' 11"  (1.803 m)  Weight: 246 lb (111.585 kg)  SpO2: 96%   General: very friendly man resting in wheelchair comfortably, appropriately conversational Cardiac: regular rate and rhythm, no rubs, murmurs or gallops Pulm: breathing well, clear to auscultation bilaterally Ext: warm and well perfused, without pedal edema Skin: 1. verruccous 21m papule on medial right forehead 2. 258mpustule atop 53m71mkin-colored papule on medial left forehead 3. flesh-colored 4x6mm63mdunculated papule on right temple 4. numerous small yellow umbilicated papules over inferior forehead  Assessment & Plan:  Case discussed with Dr. ButcLynnae Januarybaceous hyperplasia of face He has numerous yellow umbilicated papules on his anterior forehead that appear to be sebaceous hyperplasia. Today I prescribed tretinoin 0.025% cream to use nightly over his entire face. I notified him this will cause peeling for the first 3 weeks; sunscreen will help alleviate this.  Verruca vulgaris He has a small verrucous papule on his anterior forehead that appears to be a common wart. Today I froze this lesion for 8 seconds; we'll see him back in 2 to 4 weeks to freeze it  again.  Acrochordon He has a flesh-colored pedunculated papule that appears to be an acrochordon. He wanted it removed because it was rubbing against his hat and becoming inflamed. After consenting the patient of the risks, I sterilized the area, anesthetized the area with 5ml 66m2% lidocaine to achieve adequate anesthesia, excised the lesion with a DermaBlade, and achieved hemostasis with aluminum nitrate cue tip. There were no complications and patient tolerated the procedure well.  Milial cyst He has a small pustule atop a skin-colored papule where a mole was excised years ago; this appears to be a small milial cyst. Today I sterilized the lesion., nicked it with a #11 scalpel, and expressed the purulence.   Medications Ordered Meds ordered this encounter  Medications  . tretinoin (RETIN-A) 0.025 % cream    Sig: Apply topically at bedtime.    Dispense:  45 g    Refill:  0   Other Orders Orders Placed This Encounter  Procedures  . Glucose, capillary   Follow Up: Return in about 2 weeks (around 06/19/2015).

## 2015-06-05 NOTE — Assessment & Plan Note (Addendum)
He has a flesh-colored pedunculated papule that appears to be an acrochordon. He wanted it removed because it was rubbing against his hat and becoming inflamed. After consenting the patient of the risks, I sterilized the area, anesthetized the area with 20m of 2% lidocaine to achieve adequate anesthesia, excised the lesion with a DermaBlade, and achieved hemostasis with aluminum nitrate cue tip. There were no complications and patient tolerated the procedure well.

## 2015-06-05 NOTE — Assessment & Plan Note (Signed)
He has numerous yellow umbilicated papules on his anterior forehead that appear to be sebaceous hyperplasia. Today I prescribed tretinoin 0.025% cream to use nightly over his entire face. I notified him this will cause peeling for the first 3 weeks; sunscreen will help alleviate this.

## 2015-06-05 NOTE — Assessment & Plan Note (Addendum)
He has a small verrucous papule on his anterior forehead that appears to be a common wart. Today I froze this lesion for 8 seconds; we'll see him back in 2 to 4 weeks to freeze it again.

## 2015-06-05 NOTE — Assessment & Plan Note (Signed)
He has a small pustule atop a skin-colored papule where a mole was excised years ago; this appears to be a small milial cyst. Today I sterilized the lesion., nicked it with a #11 scalpel, and expressed the purulence.

## 2015-06-06 NOTE — Progress Notes (Signed)
Internal Medicine Clinic Attending  I saw and evaluated the patient.  I personally confirmed the key portions of the history and exam documented by Dr. Melburn Hake and I reviewed pertinent patient test results.  The assessment, diagnosis, and plan were formulated together and I agree with the documentation in the resident's note. I was present for all of Dr Melburn Hake' procedures.

## 2015-06-26 ENCOUNTER — Other Ambulatory Visit: Payer: Self-pay | Admitting: Internal Medicine

## 2015-07-28 ENCOUNTER — Other Ambulatory Visit: Payer: Self-pay | Admitting: Internal Medicine

## 2015-07-31 NOTE — Telephone Encounter (Signed)
Pls phone in

## 2015-08-01 ENCOUNTER — Other Ambulatory Visit: Payer: Self-pay | Admitting: Internal Medicine

## 2015-08-01 NOTE — Telephone Encounter (Signed)
Called to pharm 

## 2015-09-04 ENCOUNTER — Encounter: Payer: Self-pay | Admitting: Internal Medicine

## 2015-09-04 ENCOUNTER — Other Ambulatory Visit: Payer: Self-pay

## 2015-09-04 DIAGNOSIS — F1120 Opioid dependence, uncomplicated: Secondary | ICD-10-CM

## 2015-09-04 DIAGNOSIS — G894 Chronic pain syndrome: Secondary | ICD-10-CM

## 2015-09-04 MED ORDER — HYDROCODONE-ACETAMINOPHEN 10-325 MG PO TABS: 1.0000 | ORAL_TABLET | Freq: Four times a day (QID) | ORAL | Status: DC | PRN

## 2015-09-04 NOTE — Telephone Encounter (Signed)
Pt requesting hydrocodone to be filled.  °

## 2015-09-04 NOTE — Telephone Encounter (Signed)
Pt informed

## 2015-09-04 NOTE — Assessment & Plan Note (Signed)
Sent letter with Rx documenting taper

## 2015-09-04 NOTE — Telephone Encounter (Signed)
Last written 3 months 05/29/15 OV 3/12 PCP appointment 6/12 No recent UDS

## 2015-09-12 ENCOUNTER — Telehealth: Payer: Self-pay | Admitting: Pharmacist

## 2015-09-12 NOTE — Telephone Encounter (Signed)
Contacted patient to provide information on naloxone and notify of supply obtained from [facility] outpatient pharmacy. Patient verbalized understanding and stated he has an upcoming appointment and plans to discuss the naloxone/obtain medication at that time.

## 2015-09-14 ENCOUNTER — Encounter: Payer: Self-pay | Admitting: Internal Medicine

## 2015-09-14 ENCOUNTER — Ambulatory Visit (INDEPENDENT_AMBULATORY_CARE_PROVIDER_SITE_OTHER): Payer: Medicaid Other | Admitting: Pharmacist

## 2015-09-14 ENCOUNTER — Ambulatory Visit (INDEPENDENT_AMBULATORY_CARE_PROVIDER_SITE_OTHER): Payer: Medicaid Other | Admitting: Internal Medicine

## 2015-09-14 VITALS — BP 125/79 | HR 82 | Temp 97.9°F | Ht 71.0 in | Wt 264.4 lb

## 2015-09-14 DIAGNOSIS — E119 Type 2 diabetes mellitus without complications: Secondary | ICD-10-CM | POA: Diagnosis present

## 2015-09-14 DIAGNOSIS — F418 Other specified anxiety disorders: Secondary | ICD-10-CM

## 2015-09-14 DIAGNOSIS — M069 Rheumatoid arthritis, unspecified: Secondary | ICD-10-CM

## 2015-09-14 DIAGNOSIS — E669 Obesity, unspecified: Secondary | ICD-10-CM | POA: Diagnosis not present

## 2015-09-14 DIAGNOSIS — Z7952 Long term (current) use of systemic steroids: Secondary | ICD-10-CM

## 2015-09-14 DIAGNOSIS — Z7189 Other specified counseling: Secondary | ICD-10-CM | POA: Diagnosis present

## 2015-09-14 DIAGNOSIS — F17200 Nicotine dependence, unspecified, uncomplicated: Secondary | ICD-10-CM

## 2015-09-14 DIAGNOSIS — Z6836 Body mass index (BMI) 36.0-36.9, adult: Secondary | ICD-10-CM | POA: Diagnosis not present

## 2015-09-14 DIAGNOSIS — Z719 Counseling, unspecified: Secondary | ICD-10-CM

## 2015-09-14 DIAGNOSIS — F1120 Opioid dependence, uncomplicated: Secondary | ICD-10-CM

## 2015-09-14 DIAGNOSIS — E66813 Obesity, class 3: Secondary | ICD-10-CM

## 2015-09-14 DIAGNOSIS — E785 Hyperlipidemia, unspecified: Secondary | ICD-10-CM

## 2015-09-14 DIAGNOSIS — J410 Simple chronic bronchitis: Secondary | ICD-10-CM

## 2015-09-14 DIAGNOSIS — Z0000 Encounter for general adult medical examination without abnormal findings: Secondary | ICD-10-CM

## 2015-09-14 DIAGNOSIS — Z79891 Long term (current) use of opiate analgesic: Secondary | ICD-10-CM | POA: Diagnosis not present

## 2015-09-14 DIAGNOSIS — J449 Chronic obstructive pulmonary disease, unspecified: Secondary | ICD-10-CM

## 2015-09-14 DIAGNOSIS — Z7984 Long term (current) use of oral hypoglycemic drugs: Secondary | ICD-10-CM | POA: Diagnosis not present

## 2015-09-14 DIAGNOSIS — G4733 Obstructive sleep apnea (adult) (pediatric): Secondary | ICD-10-CM

## 2015-09-14 DIAGNOSIS — E1121 Type 2 diabetes mellitus with diabetic nephropathy: Secondary | ICD-10-CM

## 2015-09-14 LAB — GLUCOSE, CAPILLARY: Glucose-Capillary: 119 mg/dL — ABNORMAL HIGH (ref 65–99)

## 2015-09-14 LAB — POCT GLYCOSYLATED HEMOGLOBIN (HGB A1C): Hemoglobin A1C: 5.4

## 2015-09-14 MED ORDER — VARENICLINE TARTRATE 1 MG PO TABS: 1.0000 mg | ORAL_TABLET | Freq: Two times a day (BID) | ORAL | Status: DC

## 2015-09-14 MED ORDER — VARENICLINE TARTRATE 0.5 MG PO TABS: ORAL_TABLET | ORAL | Status: DC

## 2015-09-14 NOTE — Patient Instructions (Signed)
Try the chantix Start one week prior to your quit date (or at least when you try to cut down by 25%) 0.5 mg once a day for 3 days 0.76m twice a day for 4 days 1 mg twice a day If after 3 months you are able to stop smoking, I will continue it for another 3 months  See me and Wheelchair lady for paperwork  See me in 3 months  Call me when need refill of pain med

## 2015-09-14 NOTE — Progress Notes (Signed)
Naloxone was provided and reviewed with the patient, including name, instructions, indication, goals of therapy, potential side effects, importance of adherence, and safe use.  Patient verbalized understanding by repeating back information and was advised to contact me if further medication-related questions arise. Patient was also provided an information handout.

## 2015-09-14 NOTE — Progress Notes (Unsigned)
Patient ID: Andrew Fowler, male   DOB: 1960-07-08, 55 y.o.   MRN: [id number] Collaborated with outpatient pharmacy to obtain Narcan for patient. [name] presented to the clinic to pick up the medication and was educated on its use.

## 2015-09-15 ENCOUNTER — Encounter: Payer: Self-pay | Admitting: Internal Medicine

## 2015-09-15 NOTE — Assessment & Plan Note (Signed)
Wants to wait on CPAP just now.

## 2015-09-15 NOTE — Assessment & Plan Note (Signed)
Has lost 86 lbs overall although weight from last appt to today up from 246 to 264??   PLAN : cont to encourage and follow.

## 2015-09-15 NOTE — Assessment & Plan Note (Signed)
Wants to wait until he stops smoking to get PFT's. I explained that quitting will not normalize any prior damage to his lungs. He understands. Will order PFt's next appt.  PLAN : PFT in future

## 2015-09-15 NOTE — Assessment & Plan Note (Signed)
Current powerchair from [employer]. OK with using [facility]. Will sch appt with Jackelyn Poling, me, and Mr Coomes. His current chair has back recline. He has no med dx that would get advanced features although might be able to work with Jackelyn Poling to see if pain would qualify.

## 2015-09-15 NOTE — Progress Notes (Signed)
   Subjective:    Patient ID: Andrew Fowler, male    DOB: 17-Oct-1960, 55 y.o.   MRN: [id number]  HPI  Andrew Fowler is here for pain F/U. Please see the A&P for the status of the pt's chronic medical problems.  ROS : per ROS section and in problem oriented charting. All other systems are negative.  PMHx, Soc hx, and / or Fam hx : Brother has obesity and is a smoker. Both are trying to quit. Home environment (living with cousin and family) is more peaceful bc trial separation so spouse and kids gone.   Review of Systems  Respiratory: Positive for shortness of breath and wheezing.   Musculoskeletal: Positive for myalgias, back pain, joint swelling, arthralgias, gait problem, neck pain and neck stiffness.  Skin: Negative for wound.  Psychiatric/Behavioral: The patient is nervous/anxious.        Bored. No hobbies to fill day       Objective:   Physical Exam  Constitutional: He is oriented to person, place, and time. He appears well-developed and well-nourished. No distress.  HENT:  Head: Normocephalic and atraumatic.  Right Ear: External ear normal.  Left Ear: External ear normal.  Nose: Nose normal.  Eyes: Conjunctivae and EOM are normal.  Cardiovascular: Normal rate, regular rhythm and normal heart sounds.   Pulmonary/Chest: Effort normal. He has wheezes.  Musculoskeletal: He exhibits no edema.  Neurological: He is alert and oriented to person, place, and time.  Skin: Skin is warm and dry. He is not diaphoretic.  Psychiatric: He has a normal mood and affect. His behavior is normal. Judgment and thought content normal.          Assessment & Plan:

## 2015-09-15 NOTE — Assessment & Plan Note (Signed)
Sees Dr Trudie Reed and Veleta Miners. On prednisone 4, leflunomide, and humira

## 2015-09-15 NOTE — Assessment & Plan Note (Signed)
As he wants to quit smoking, now is not the time to decrease xanax further. Cont 55 per month xanax 0.5 mg.  PLAN:  Cont current meds

## 2015-09-15 NOTE — Assessment & Plan Note (Addendum)
He is OK with trying to slowly decrease quantity back to 100 although be was concerned with my wording about reassessing in Spring. He was concerned I would further taper. We both agree that it would be great if he eventually didn't need any but we are both realistic that he has many reasons for pain and might need lifelong. He now understands what I meant by reassess. UDS today.  PLAN : Taper hydrocodone 10 from 120 per month to 100. UDS F/U with me in Sept Brother to lean how to use narcan

## 2015-09-15 NOTE — Assessment & Plan Note (Signed)
He "cured" his DM with weight loss and dietary changes. His A1C is 5.4. He remains on metformin 1000 QD due to its beneficial effects on weight and possible CV benefits. Microalb today. Needs DM eye exam.  PLAN:  Cont current meds

## 2015-09-15 NOTE — Assessment & Plan Note (Signed)
Nicotine patches did not work. Wants to retry chantix. Brother also trying to quit at same time. Home life more calm. On 1.5 PPD and resp status worse - wheezing.   PLAN : chantix

## 2015-09-15 NOTE — Assessment & Plan Note (Signed)
On simva 40. I would cont this to counteract the inflamm he has with RA.  PLAN:  Cont current meds

## 2015-09-22 LAB — MICROALBUMIN / CREATININE URINE RATIO
CREATININE, UR: 27 mg/dL
Microalbumin, Urine: <3 ug/mL

## 2015-09-22 LAB — TOXASSURE SELECT,+ANTIDEPR,UR: PDF: 0

## 2015-10-17 ENCOUNTER — Ambulatory Visit (INDEPENDENT_AMBULATORY_CARE_PROVIDER_SITE_OTHER): Payer: Medicaid Other | Admitting: Internal Medicine

## 2015-10-17 ENCOUNTER — Encounter: Payer: Self-pay | Admitting: Internal Medicine

## 2015-10-17 VITALS — BP 147/86 | HR 94 | Temp 97.8°F | Ht 71.0 in | Wt 260.8 lb

## 2015-10-17 DIAGNOSIS — M069 Rheumatoid arthritis, unspecified: Secondary | ICD-10-CM

## 2015-10-17 NOTE — Patient Instructions (Signed)
To RTC Sept for pain med refill

## 2015-10-17 NOTE — Progress Notes (Signed)
   Subjective:    Patient ID: Andrew Fowler, male    DOB: 04-Jun-1960, 55 y.o.   MRN: [id number]  HPI  Andrew Fowler is here for mobility examination and powerchair assessment. Please see the A&P for the status of the pt's chronic medical problems.  ROS : per ROS section and in problem oriented charting. All other systems are negative.   PMHx, Soc hx, and / or Fam hx : Has wood ramp to enter home. Applies anti-slip stuff twice a year. Ramp ends in gravel and then grass and needs push in winter.   Review of Systems  Constitutional: Positive for fatigue. Negative for unexpected weight change.  Musculoskeletal: Positive for arthralgias, back pain, gait problem, joint swelling and myalgias.  Skin: Negative for rash and wound.  Neurological: Positive for weakness.       Objective:   Physical Exam  Constitutional: He appears well-developed and well-nourished. No distress.  HENT:  Head: Normocephalic and atraumatic.  Right Ear: External ear normal.  Left Ear: External ear normal.  Nose: Nose normal.  Eyes: Right eye exhibits no discharge. Left eye exhibits no discharge.  Conj injected B  Neurological: He is alert.  Grip strength 5/5 B Elbow flexion 3/5 Elbow extension 5/5 Knee extension and flexion 5/5 Hip flexion 3/5 Ankle extension and flexion 5/5  Skin: Skin is warm and dry. No rash noted. He is not diaphoretic. No erythema.  Psychiatric: He has a normal mood and affect. His behavior is normal. Judgment and thought content normal.          Assessment & Plan:

## 2015-10-17 NOTE — Assessment & Plan Note (Addendum)
Andrew Fowler has used a Theatre [employer] for the past 6 yrs. He requires the powerchair 2/2 pain and weakness from RA, OA, s/p R hip arthroplasty due to OA and ? AVN, s/p L4-L5 laminectomy, and DDD of the lumbar and cervical spine. He has pain nearly every day, usually ranging from 4-6 on a 10 point scale for a typical day. He requires the powerchair to get to the shower and toilet. He is unable to use a cane or walker due to reduced walking endurance and shoulder and wrist pain. He is unable to use a manual chair 2/2 shoulder and wrist pain. He is unable to use a scooter due to reduced seat padding and his wrist and shoulder pain would probit use of a tiller. His muscle strengeth is listed in the exam. He has the physical and mental ability to operate a power wheelchair safely in his home and is motivated to use the power wheelchair.

## 2015-10-31 ENCOUNTER — Telehealth: Payer: Self-pay | Admitting: Internal Medicine

## 2015-10-31 NOTE — Telephone Encounter (Signed)
sorry. corrected.

## 2015-10-31 NOTE — Telephone Encounter (Signed)
Rec' call from Andrew Fowler needing a Correction on notes for the [facility] Assessment on 10/17/2015.  Notes indicate the wrong patient mentioned.  Andrew Fowler would like for the Notes to be corrected to Andrew Fowler instead, as she can not submit notes to [employer] because of the notes.  Please advise.

## 2015-11-01 NOTE — Telephone Encounter (Signed)
Thank you for correcting the patient's information.  Andrew Fowler has stopped by today and requested one last thing to be mentioned in your note.  The pain level needs to be rated on a 0 out of 10 scale.  This only needs to be mentioned 1 time for his over all pain level and the note will be ready to be processed by the patient's insurance.

## 2015-11-02 NOTE — Telephone Encounter (Signed)
The pain was recorded on the day of appt as a 0 which I doubt was accurate so I had to use some fancy wording. Hope it is OK.

## 2015-11-23 ENCOUNTER — Telehealth: Payer: Self-pay | Admitting: *Deleted

## 2015-11-23 NOTE — Telephone Encounter (Signed)
New medicaid guidelines require a prior authorization for  anyone receiving greater than a 14-day supply of controlled medications.  Pt's pcp has completed the appropriate paperwork which was submitted online via [employer].  Records were also manually faxed to number provided. Request has been sent for review.Andrew Hidden Cassady8/31/201712:04 PM      [phone] W

## 2015-11-29 ENCOUNTER — Telehealth: Payer: Self-pay | Admitting: Internal Medicine

## 2015-11-29 NOTE — Telephone Encounter (Signed)
APT. REMINDER CALL, LMTCB °

## 2015-11-30 ENCOUNTER — Ambulatory Visit (INDEPENDENT_AMBULATORY_CARE_PROVIDER_SITE_OTHER): Payer: Medicaid Other | Admitting: Internal Medicine

## 2015-11-30 ENCOUNTER — Encounter: Payer: Self-pay | Admitting: Internal Medicine

## 2015-11-30 DIAGNOSIS — Z7984 Long term (current) use of oral hypoglycemic drugs: Secondary | ICD-10-CM

## 2015-11-30 DIAGNOSIS — Z79891 Long term (current) use of opiate analgesic: Secondary | ICD-10-CM

## 2015-11-30 DIAGNOSIS — R21 Rash and other nonspecific skin eruption: Secondary | ICD-10-CM

## 2015-11-30 DIAGNOSIS — Z0000 Encounter for general adult medical examination without abnormal findings: Secondary | ICD-10-CM

## 2015-11-30 DIAGNOSIS — Z7952 Long term (current) use of systemic steroids: Secondary | ICD-10-CM | POA: Diagnosis not present

## 2015-11-30 DIAGNOSIS — F418 Other specified anxiety disorders: Secondary | ICD-10-CM

## 2015-11-30 DIAGNOSIS — F17210 Nicotine dependence, cigarettes, uncomplicated: Secondary | ICD-10-CM | POA: Diagnosis not present

## 2015-11-30 DIAGNOSIS — E119 Type 2 diabetes mellitus without complications: Secondary | ICD-10-CM | POA: Diagnosis present

## 2015-11-30 DIAGNOSIS — M069 Rheumatoid arthritis, unspecified: Secondary | ICD-10-CM | POA: Diagnosis not present

## 2015-11-30 DIAGNOSIS — F1120 Opioid dependence, uncomplicated: Secondary | ICD-10-CM

## 2015-11-30 DIAGNOSIS — F17200 Nicotine dependence, unspecified, uncomplicated: Secondary | ICD-10-CM

## 2015-11-30 DIAGNOSIS — G894 Chronic pain syndrome: Secondary | ICD-10-CM

## 2015-11-30 MED ORDER — HYDROCODONE-ACETAMINOPHEN 10-325 MG PO TABS: 1.0000 | ORAL_TABLET | Freq: Four times a day (QID) | ORAL | 0 refills | Status: DC | PRN

## 2015-11-30 MED ORDER — VARENICLINE TARTRATE 1 MG PO TABS: 1.0000 mg | ORAL_TABLET | Freq: Two times a day (BID) | ORAL | 2 refills | Status: DC

## 2015-11-30 MED ORDER — ALBUTEROL SULFATE HFA 108 (90 BASE) MCG/ACT IN AERS: 1.0000 | INHALATION_SPRAY | RESPIRATORY_TRACT | 5 refills | Status: DC | PRN

## 2015-11-30 MED ORDER — VARENICLINE TARTRATE 0.5 MG PO TABS: ORAL_TABLET | ORAL | 0 refills | Status: DC

## 2015-11-30 NOTE — Assessment & Plan Note (Signed)
For 6 weeks, spot at top of gluteal cleft. Not really itchy but scaly until used creams (Gold bond, topical abx cream) and would then make it smooth. About 6 cm circular like area with about 2 cm border of erythema with clear margin and no satellite lesions. The inside is lichenified with brown scales. No odor no drainage. No elbow / knee lesion. No prior skin lesions. TNF alphas (he is on humira) can cause psoriasiform eruption and this is my best diff dx now. Will plan for punch bx to confirm dx.

## 2015-11-30 NOTE — Patient Instructions (Signed)
1. I will look into the skin rash and call you

## 2015-11-30 NOTE — Assessment & Plan Note (Signed)
Only using  35-40 xanax per month. I think his UDS was negative 2/2 usage pattern. Less stress in life now and better coping.  PLAN:  Cont current meds

## 2015-11-30 NOTE — Assessment & Plan Note (Signed)
Andrew Fowler states now on 5 mg prednisone. RA sxs stable. See rheum.  PLAN:  Cont current meds

## 2015-11-30 NOTE — Assessment & Plan Note (Signed)
We discussed whether to change the dx from DM to h/o DM since he had massive weight loss and "cure" of his DM. We elected to keep DM as the dx. He is on metformin mostly to help with weight loss. Desires DM foot exam (brother cut his skin when cutting toenails) and eye exam.

## 2015-11-30 NOTE — Assessment & Plan Note (Signed)
There was a mix up on the chantix and he did not get the 1 mg pills to take BID. Not taking for several weeks. He thought it did help. Decreased from 1.5 PPD to 18-20 cigs per day.  PLAN : resume chantix starter pack then 1 mg BID

## 2015-11-30 NOTE — Progress Notes (Signed)
   Subjective:    Patient ID: Andrew Fowler, male    DOB: 1961-02-09, 55 y.o.   MRN: [id number]  HPI  [name] is here for chronic pain F/U. Please see the A&P for the status of the pt's chronic medical problems.  ROS : per ROS section and in problem oriented charting. All other systems are negative.  PMHx, Soc hx, and / or Fam hx : Brother and pt trying to quit tobacco  Review of Systems  Musculoskeletal: Positive for arthralgias, back pain, gait problem and myalgias.  Skin: Positive for color change, rash and wound.  Psychiatric/Behavioral: Negative for agitation and behavioral problems. The patient is not nervous/anxious.        Objective:   Physical Exam  Constitutional: He is oriented to person, place, and time. He appears well-developed and well-nourished. No distress.  HENT:  Head: Normocephalic and atraumatic.  Right Ear: External ear normal.  Left Ear: External ear normal.  Nose: Nose normal.  Eyes: Conjunctivae and EOM are normal.  Neurological: He is alert and oriented to person, place, and time.  Skin: Skin is warm and dry. Rash noted. He is not diaphoretic. There is erythema.  See A&P  Psychiatric: He has a normal mood and affect. His behavior is normal. Judgment and thought content normal.          Assessment & Plan:

## 2015-11-30 NOTE — Assessment & Plan Note (Signed)
Has not really tapered down the hydrocodone. Taking 4 a day about 26 days per month and 3 on other days. Takes one in AM< one at lunch, one about 4-5 PM, and last one about bedtime. He feels this provides best benefit without SE. He asked about what happens 5-15 yrs down road and we had discussion about tolerance, accidental OD (has narcan and brother can admin), abuse, diversion, dose titration down, why I constantly ask about pain med usage, and illicit drug usage. I have very low concern about misuse or abuse in Andrew Fowler. The goal is to get him on lowest dose possible but I also need to balance lifestyle, physical ability, and I think overall, keeping at 120 instead of titrating down to 100 is in pt's best interest. He understands I will constantly be asking about titration down.   PLAN:  Cont current meds

## 2015-12-11 ENCOUNTER — Ambulatory Visit: Payer: Medicaid Other | Admitting: Podiatry

## 2015-12-12 ENCOUNTER — Ambulatory Visit: Payer: Medicaid Other

## 2015-12-18 ENCOUNTER — Other Ambulatory Visit (HOSPITAL_COMMUNITY)
Admission: RE | Admit: 2015-12-18 | Discharge: 2015-12-18 | Disposition: A | Discharge status: Home / Self Care | Payer: Medicaid Other | Source: Ambulatory Visit | Attending: Internal Medicine | Admitting: Internal Medicine

## 2015-12-18 ENCOUNTER — Ambulatory Visit (INDEPENDENT_AMBULATORY_CARE_PROVIDER_SITE_OTHER): Payer: Medicaid Other | Admitting: Internal Medicine

## 2015-12-18 VITALS — BP 174/93 | HR 83 | Temp 97.9°F | Ht 71.0 in | Wt 270.1 lb

## 2015-12-18 DIAGNOSIS — L988 Other specified disorders of the skin and subcutaneous tissue: Secondary | ICD-10-CM

## 2015-12-18 DIAGNOSIS — Z0000 Encounter for general adult medical examination without abnormal findings: Secondary | ICD-10-CM

## 2015-12-18 DIAGNOSIS — R21 Rash and other nonspecific skin eruption: Secondary | ICD-10-CM | POA: Diagnosis present

## 2015-12-18 DIAGNOSIS — L309 Dermatitis, unspecified: Secondary | ICD-10-CM | POA: Insufficient documentation

## 2015-12-18 NOTE — Progress Notes (Signed)
Skin Punch Biopsy Procedure Note:  After informed written consent was obtained, using Betadine for cleansing and 2% Lidocaine with epinephrine for anesthetic, with sterile technique a punch biopsy was used to obtain a biopsy specimen of the lesion. Hemostasis was obtained by pressure and wound was bandaged with sterile gauze and wound care instructions provided. Be alert for any signs of cutaneous infection. The specimen is labeled and sent to pathology for evaluation. The procedure was well tolerated without complications.

## 2015-12-18 NOTE — Patient Instructions (Signed)
It was a pleasure to meet you today.  Dr. Lynnae January will notify you of the results of the biopsy today.  Please keep an eye on the biopsy site and let us know if it is not healing well or you are worried about infection.

## 2015-12-19 ENCOUNTER — Encounter: Payer: Self-pay | Admitting: Pharmacist

## 2015-12-20 NOTE — Progress Notes (Signed)
Internal Medicine Clinic Attending  Case discussed with Dr. Juleen China soon after the resident saw the patient.  We reviewed the resident's history and exam and pertinent patient test results.  I agree with the assessment, diagnosis, and plan of care documented in the resident's note. I was present for entire procedure.

## 2015-12-27 ENCOUNTER — Other Ambulatory Visit: Payer: Self-pay | Admitting: Internal Medicine

## 2015-12-27 DIAGNOSIS — L409 Psoriasis, unspecified: Secondary | ICD-10-CM

## 2015-12-27 MED ORDER — FLUOCINONIDE-E 0.05 % EX CREA: 1.0000 "application " | TOPICAL_CREAM | Freq: Two times a day (BID) | CUTANEOUS | 1 refills | Status: DC

## 2015-12-27 NOTE — Assessment & Plan Note (Signed)
Called Mr Veasey to discuss results of skin biopsy. He states the area at top of buttock is almost 80% gone. Not using any creams when I asked him to stop in preparation for biopsy.   We discussed psoriasis, the RF (inc nut not limited to obesity, DM, genetics, smoking, and TNF alpha), course, prognosis, and treatments. Since he has so many other traditional risk factors, his adalimumab may not have played a role. Regardless, nothing I read indicated it needed to be stopped.  For tx - I was planning on combo of high potency steroid cream and topical vitamin D analogs. However, since almost gone, will just use the Lidex E 0.05% cream BID to affected skin. He is to stop it when the skin is no longer affected / thick. I warned him not to use on normal skin. If it flares, can add calcipotriene BID  He has appt on 18th and I assured him I would send everything to Dr Trudie Reed.  I am sending him UTD info on psoriasis and the biopsy results.  This cont to put him at high risk for CV disease. He is on a statin and ASA. He is trying to eat well and is losing weight. I again encouraged him to try to quit tobacco.

## 2016-01-05 NOTE — Telephone Encounter (Signed)
Request was approved for 74mhs

## 2016-01-11 NOTE — Addendum Note (Signed)
Addended by: Hulan Fray on: 01/11/2016 08:07 PM   Modules accepted: Orders

## 2016-01-30 ENCOUNTER — Other Ambulatory Visit: Payer: Self-pay | Admitting: Internal Medicine

## 2016-01-31 NOTE — Telephone Encounter (Signed)
One month supply until Dr. Lynnae January back in office.

## 2016-02-01 NOTE — Telephone Encounter (Signed)
Phoned into pharamcy.[name], Andrew Bremer Cassady11/9/20179:52 AM

## 2016-02-22 ENCOUNTER — Encounter: Payer: Self-pay | Admitting: Internal Medicine

## 2016-02-22 ENCOUNTER — Ambulatory Visit (INDEPENDENT_AMBULATORY_CARE_PROVIDER_SITE_OTHER): Payer: Medicaid Other | Admitting: Internal Medicine

## 2016-02-22 VITALS — BP 142/90 | HR 95 | Temp 98.2°F | Ht 71.0 in | Wt 271.7 lb

## 2016-02-22 DIAGNOSIS — Z79899 Other long term (current) drug therapy: Secondary | ICD-10-CM

## 2016-02-22 DIAGNOSIS — Z23 Encounter for immunization: Secondary | ICD-10-CM | POA: Diagnosis not present

## 2016-02-22 DIAGNOSIS — F418 Other specified anxiety disorders: Secondary | ICD-10-CM | POA: Diagnosis not present

## 2016-02-22 DIAGNOSIS — G894 Chronic pain syndrome: Secondary | ICD-10-CM

## 2016-02-22 DIAGNOSIS — Z0000 Encounter for general adult medical examination without abnormal findings: Secondary | ICD-10-CM

## 2016-02-22 DIAGNOSIS — M069 Rheumatoid arthritis, unspecified: Secondary | ICD-10-CM

## 2016-02-22 DIAGNOSIS — Z79891 Long term (current) use of opiate analgesic: Secondary | ICD-10-CM

## 2016-02-22 DIAGNOSIS — F1120 Opioid dependence, uncomplicated: Secondary | ICD-10-CM

## 2016-02-22 MED ORDER — HYDROCODONE-ACETAMINOPHEN 10-325 MG PO TABS: 1.0000 | ORAL_TABLET | Freq: Four times a day (QID) | ORAL | 0 refills | Status: DC | PRN

## 2016-02-22 MED ORDER — ALPRAZOLAM 0.5 MG PO TABS: ORAL_TABLET | ORAL | 0 refills | Status: DC

## 2016-02-22 MED ORDER — HYDROCODONE-ACETAMINOPHEN 10-325 MG PO TABS
1.0000 | ORAL_TABLET | Freq: Four times a day (QID) | ORAL | 0 refills | Status: DC | PRN
Start: 2016-02-22 — End: 2016-02-22

## 2016-02-22 NOTE — Assessment & Plan Note (Addendum)
He also has social anxiety managed with alprazolam, he takes 1-2 pills per day for this anxiety. Had previously discussed decreasing his monthly xanax dose with Dr. Lynnae January, he feels ready to decrease the number of pills he receives today. [place] controlled substance database checked and is appropriate.  Refilled 2 months xanax 0.5 mg daily # 40   UDS today   Addendum: UDS returned positive for Norco, negative for alprazolam and contained alcohol. I will notify his PCP

## 2016-02-22 NOTE — Assessment & Plan Note (Signed)
Influenza vaccination today

## 2016-02-22 NOTE — Progress Notes (Signed)
CC: medictation management for chronic pain and anxiety   HPI: [name] is a 55 y.o. with past medical history as outlined below who presents to clinic for follow up of chronic pain and anxiety. He has a long history of pain related to his rheumatoid arthritis. He has been managed with humira for disease modification and hydrocodone for pain. The pain medication allows him to perform his daily activities. Takes 3.5 to 4 pills per day. He denies daytime lethargy, difficulty breathing or constipation.  He also has social anxiety managed with alprazolam, he takes 1-2 pills per day for this anxiety.  Please see problem list for status of the pt's chronic medical problems.  Past Medical History:  Diagnosis Date  . Abnormal laboratory test result 07/04/2011   Immunofixation electrophoresis 3/13 showed slightly restricted mobility in the IgG and kappa lanes and suggested repeat end of year.   Marland Kitchen Anxiety associated with depression 04/19/2011   On chronic benzos and SSRI's. Gets panic attacks. Does not see mental health.  . Chronic pain syndrome 04/19/2011   Combination of RA, obesity, OA, & DDD. Has seen Dr Sharol Given & s/p R total hip arthroplasty 2/2 OA & ? AVN. s/p L4-L5 laminectomy. Lumbar MRI 4/11 : L3-L4 mod central canal narrowing.  Cervical MRI 6/11 : multi-level DDD and L foraminal stenosis at C4-5 and C5-6.   Marland Kitchen Chronic venous insufficiency 2013   ECHO 2013 was normal. LFT's, creatinine, TSH all normal. Alb a bit low.  Marland Kitchen COPD (chronic obstructive pulmonary disease) ([place])    Per pt, he has a diagnosis of COPD. No PFT's. Uses Alb MDI less than once a month.  . Diabetes mellitus    Non-insulin dependent Type II.  Marland Kitchen Elevated liver function tests 05/17/2011   04/2010. Increased GGT (pt uses ETOH). AMA negative. ABD U/S limited but otherwise negative. Follow Alk phos level. AST & ALT also elevated. False + IgM Hep B Core Ab. Hep B viral load negative.   Same pattern during hospitalization 2010 :  highest alkaline phosphatase was 355, AST 259, and ALT 871. ANA, rheumatoid factor, ceruloplasmin, CMV IgM, alpha antitrypsin, and AMA, all within normal limits.  . Gout    Pt has never had a crystal diagnosis.  . Hepatitis B antibody positive 2013   IgM Hep B core Ab + but Hepatitis B viral load negative.   . Hyperlipidemia    At goal of LDL <100 with statin.  Marland Kitchen Hypertension    ACEI monotherapy  . NASH (nonalcoholic steatohepatitis), ? some EtOH contribution and fibrosis  05/17/2011   Elevated since 05/2008 during hospitalization. Relatively stable. Extensive W/U and no etiology but likely fatty liver 2/2 obesity despite normal imaging.  Hepatitis - all negative. There was an initial + IgM Hep B core Ab but repeat testing was negative and Hep B viral load was negative.  TTG 06/15/2013  ANA x 2 (05/07/11 and 06/13/2008) ASMA x2 (07/18/2011 negative & weakly + at 21 on 05/27/2011 & nega  . Obesity, Class III, BMI 40-49.9 (morbid obesity) ([place])   . Obstructive sleep apnea 08/21/08   Sleep study AHI 16.4 with desat to 66%. CPAP of 17 decreased AHI to 0.9. Non-compliant with CPAP.  . RA (rheumatoid arthritis) ([place]) 2013  . Shortness of breath   . Tobacco abuse    Per pt, he has a diagnosis of COPD. Need to locate PFT's.    Review of Systems:  Please see each problem below for a pertinent review  of systems. ROS  Physical Exam:  Vitals:   02/22/16 1330  BP: (!) 142/90  Pulse: 95  Temp: 98.2 F (36.8 C)  TempSrc: Oral  SpO2: 96%  Weight: 271 lb 11.2 oz (123.2 kg)  Height: 5' 11"  (1.803 m)   Physical Exam  Constitutional: He appears well-developed and well-nourished. No distress.  Cardiovascular: Normal rate and regular rhythm.   No murmur heard. Pulmonary/Chest: Effort normal. He has no wheezes. He has no rales.  Abdominal: Soft. He exhibits no distension. There is no tenderness.   Assessment & Plan:   See Encounters Tab for problem based charting.  Patient seen with Dr. Angelia Mould

## 2016-02-22 NOTE — Patient Instructions (Addendum)
It was a pleasure to meet you today Andrew Fowler   We have provided you with refills of your medications   Please schedule a follow up appointment to see Dr. Lynnae January early next year

## 2016-02-22 NOTE — Assessment & Plan Note (Addendum)
He has a long history of pain related to his rheumatoid arthritis. He has been managed with humira for disease modification and hydrocodone for pain. The pain medication allows him to perform his daily activities. Takes 3.5 to 4 pills per day. He denies daytime lethargy, difficulty breathing or constipation. [place] controlled substance database checked and it is appropriate.  Refilled 2 months Hydrocodone - Acetaminophen 10- 325 mg # 120  Urine drug screen today   Addendum: UDS returned positive for Norco, negative for alprazolam and contained alcohol. Will notify his PCP

## 2016-02-27 NOTE — Progress Notes (Signed)
Internal Medicine Clinic Attending  I saw and evaluated the patient.  I personally confirmed the key portions of the history and exam documented by Dr. Blum and I reviewed pertinent patient test results.  The assessment, diagnosis, and plan were formulated together and I agree with the documentation in the resident's note. 

## 2016-03-01 LAB — TOXASSURE SELECT,+ANTIDEPR,UR

## 2016-04-18 ENCOUNTER — Telehealth: Payer: Self-pay | Admitting: Internal Medicine

## 2016-04-18 NOTE — Telephone Encounter (Signed)
APT. REMINDER CALL, LMTCB °

## 2016-04-19 ENCOUNTER — Ambulatory Visit (INDEPENDENT_AMBULATORY_CARE_PROVIDER_SITE_OTHER): Payer: Medicaid Other | Admitting: Internal Medicine

## 2016-04-19 ENCOUNTER — Telehealth: Payer: Self-pay | Admitting: *Deleted

## 2016-04-19 VITALS — BP 120/70 | HR 90 | Temp 97.4°F | Ht 71.0 in | Wt 266.6 lb

## 2016-04-19 DIAGNOSIS — Z23 Encounter for immunization: Secondary | ICD-10-CM

## 2016-04-19 DIAGNOSIS — M05719 Rheumatoid arthritis with rheumatoid factor of unspecified shoulder without organ or systems involvement: Secondary | ICD-10-CM

## 2016-04-19 DIAGNOSIS — I10 Essential (primary) hypertension: Secondary | ICD-10-CM

## 2016-04-19 DIAGNOSIS — F17210 Nicotine dependence, cigarettes, uncomplicated: Secondary | ICD-10-CM | POA: Diagnosis not present

## 2016-04-19 DIAGNOSIS — M0589 Other rheumatoid arthritis with rheumatoid factor of multiple sites: Secondary | ICD-10-CM

## 2016-04-19 DIAGNOSIS — Z79899 Other long term (current) drug therapy: Secondary | ICD-10-CM | POA: Diagnosis not present

## 2016-04-19 DIAGNOSIS — Z7984 Long term (current) use of oral hypoglycemic drugs: Secondary | ICD-10-CM | POA: Diagnosis not present

## 2016-04-19 DIAGNOSIS — Z0000 Encounter for general adult medical examination without abnormal findings: Secondary | ICD-10-CM

## 2016-04-19 DIAGNOSIS — F17200 Nicotine dependence, unspecified, uncomplicated: Secondary | ICD-10-CM

## 2016-04-19 DIAGNOSIS — L409 Psoriasis, unspecified: Secondary | ICD-10-CM

## 2016-04-19 DIAGNOSIS — Z79891 Long term (current) use of opiate analgesic: Secondary | ICD-10-CM | POA: Diagnosis not present

## 2016-04-19 DIAGNOSIS — E785 Hyperlipidemia, unspecified: Secondary | ICD-10-CM | POA: Diagnosis not present

## 2016-04-19 DIAGNOSIS — M069 Rheumatoid arthritis, unspecified: Secondary | ICD-10-CM

## 2016-04-19 DIAGNOSIS — J449 Chronic obstructive pulmonary disease, unspecified: Secondary | ICD-10-CM | POA: Diagnosis not present

## 2016-04-19 DIAGNOSIS — G4733 Obstructive sleep apnea (adult) (pediatric): Secondary | ICD-10-CM | POA: Diagnosis not present

## 2016-04-19 DIAGNOSIS — E119 Type 2 diabetes mellitus without complications: Secondary | ICD-10-CM | POA: Diagnosis present

## 2016-04-19 DIAGNOSIS — F418 Other specified anxiety disorders: Secondary | ICD-10-CM | POA: Diagnosis not present

## 2016-04-19 DIAGNOSIS — G894 Chronic pain syndrome: Secondary | ICD-10-CM

## 2016-04-19 DIAGNOSIS — J410 Simple chronic bronchitis: Secondary | ICD-10-CM

## 2016-04-19 DIAGNOSIS — E7800 Pure hypercholesterolemia, unspecified: Secondary | ICD-10-CM

## 2016-04-19 DIAGNOSIS — K760 Fatty (change of) liver, not elsewhere classified: Secondary | ICD-10-CM

## 2016-04-19 DIAGNOSIS — F1120 Opioid dependence, uncomplicated: Secondary | ICD-10-CM

## 2016-04-19 LAB — POCT GLYCOSYLATED HEMOGLOBIN (HGB A1C): Hemoglobin A1C: 6

## 2016-04-19 LAB — GLUCOSE, CAPILLARY: GLUCOSE-CAPILLARY: 177 mg/dL — AB (ref 65–99)

## 2016-04-19 MED ORDER — HYDROCODONE-ACETAMINOPHEN 10-325 MG PO TABS: 1.0000 | ORAL_TABLET | Freq: Four times a day (QID) | ORAL | 0 refills | Status: DC | PRN

## 2016-04-19 MED ORDER — GLUCOSE BLOOD VI STRP: ORAL_STRIP | 7 refills | Status: DC

## 2016-04-19 MED ORDER — ACCU-CHEK AVIVA PLUS W/DEVICE KIT: PACK | 0 refills | Status: DC

## 2016-04-19 MED ORDER — ALPRAZOLAM 0.5 MG PO TABS: ORAL_TABLET | ORAL | 5 refills | Status: DC

## 2016-04-19 MED ORDER — METFORMIN HCL 1000 MG PO TABS: 1000.0000 mg | ORAL_TABLET | Freq: Every day | ORAL | 11 refills | Status: DC

## 2016-04-19 NOTE — Assessment & Plan Note (Signed)
This problem is chronic and improved. He has decreased his smoking from 1.5 PPD to 0.5 PPD. He is taking Chantix although he was unable to tolerate the twice a day dosing. He is not taking Chantix one pill in the afternoon and tolerating well. His brother is also trying to cut back on his smoking. The roommate smokes about 3 packs per day.  Plan : Continue Chantix for now as he is decreasing his cigarette use

## 2016-04-19 NOTE — Assessment & Plan Note (Signed)
This problem is chronic and stable. It is managed by Dr. Trudie Reed and also Marella Chimes. He is on prednisone 5 mg and leflunomide. He states that at his appointment 4 days ago they discussed the possibility of eventually needing knee replacement. Ms. Pearline Cables injected his right knee with cortisone at his appointment this week. She requests that I obtain a CBC and a CMP which I have done and will fax to her at [phone].  PLAN  CBC CMP

## 2016-04-19 NOTE — Assessment & Plan Note (Addendum)
This problem is chronic and stable. His pain is multifactorial and due to rheumatoid arthritis, obesity, OA, and degenerative joint disease. He is on hydrocodone 10 mg. He gets 120 per month and takes 4 pills although occasionally he only takes only takes 3. He has no constipation, sedation, other negative side effects to this therapy. I checked his [place] today and it is appropriate. His urine drug screens have always been appropriate. He knows the goal is to eventually get off of this medicine. We are not there yet. It is not time for a taper as the cold weather does worsen his pain. Also [facility]  PLAN:  Cont current meds I gave him 3 paper copies of his hydrocodone today.

## 2016-04-19 NOTE — Patient Instructions (Signed)
1. See me in 3 months 2. I gave you 3 paper copies of your pain meds 3. I gave you paper copy of your xanax

## 2016-04-19 NOTE — Addendum Note (Signed)
Addended by: Marcelino Duster on: 04/19/2016 05:03 PM   Modules accepted: Orders

## 2016-04-19 NOTE — Assessment & Plan Note (Signed)
This problem is chronic and stable. He is on a statin Zocor 40 for primary prevention. His diabetes is for all practical purposes cured. However he has RA which would result in a lot of inflammation. We discussed we could continue the statin to counteract that. Last lipid panel was in 2015.  PLAN:  Cont current meds FLP

## 2016-04-19 NOTE — Assessment & Plan Note (Signed)
With his weight loss he was able to come off all BP medicines. His blood pressure remains well controlled today off medications.  Plan : continue to follow

## 2016-04-19 NOTE — Assessment & Plan Note (Signed)
This problem is chronic and improved. He had a psoriasiform reaction on his mid lower back to his biologic which is being used to tx his RA. We confirmed the diagnosis with a skin biopsy. He used Lidex E cream with excellent results. He now only uses it as needed when he starts noticing the area getting worse. We discussed that we just don't know whether he will continue to get other areas on other parts of his body.  Plan : Lidex E when necessary

## 2016-04-19 NOTE — Assessment & Plan Note (Signed)
He is getting the PCV 13 today Butch Penny gave him information on several sneakers He wants to wait until summer to get his diabetic eye exam Lipid panel today  He got his new powerchair. It is different than his current chair and he feels less stable as it as it has only one bar connected to the seat.

## 2016-04-19 NOTE — Progress Notes (Signed)
   Subjective:    Patient ID: Andrew Fowler, male    DOB: [birth year], 56 y.o.   MRN: [id number]  HPI  Andrew Fowler is here for pain F/U. Please see the A&P for the status of the pt's chronic medical problems.  ROS : per ROS section and in problem oriented charting. All other systems are negative.  PMHx, Soc hx, and / or Fam hx : Saw rheum on 1/22nd. PA Pearline Cables rec that he start water exercising and he is interested in finding place in [place] to do this. With snow, he gets trapped in the house as his wheelchair gets bogged in snow / ice / mud / wet ground. He has decreased his smoking from 1.5 PPD to 0.5 PPD. His brother also has diabetes and back pain.  Review of Systems  Respiratory: Negative for cough, shortness of breath and wheezing.   Musculoskeletal: Positive for arthralgias, back pain, gait problem and myalgias.  Skin: Positive for rash and wound.  Neurological: Positive for weakness.  Psychiatric/Behavioral: Negative for sleep disturbance. The patient is nervous/anxious.        Objective:   Physical Exam  Constitutional: He appears well-developed and well-nourished. No distress.  HENT:  Head: Normocephalic and atraumatic.  Right Ear: External ear normal.  Left Ear: External ear normal.  Nose: Nose normal.  Eyes: Conjunctivae and EOM are normal. Right eye exhibits no discharge. Left eye exhibits no discharge. No scleral icterus.  Cardiovascular: Normal rate, regular rhythm and normal heart sounds.   No murmur heard. Pulmonary/Chest: Effort normal. No respiratory distress. He has wheezes.  Worse in the anterior fields and the left posterior field Good airflow throughout  Musculoskeletal: He exhibits no edema.  Muscular atrophy lower extremities bilaterally  Skin: Skin is warm and dry. He is not diaphoretic.  2 cm abrasion inferior left knee, clean, no surrounding erythema, no discharge  Psychiatric: He has a normal mood and affect. His behavior is normal. Judgment and  thought content normal.          Assessment & Plan:

## 2016-04-19 NOTE — Assessment & Plan Note (Signed)
This problem is chronic and possibly improved. He states his breathing and sleeping is much better now that he has lost weight. He also has an air purifier that he thinks helps his breathing. He is not interested in repeating a sleep study at this time.  Plan : Continue to follow

## 2016-04-19 NOTE — Assessment & Plan Note (Signed)
This problem is chronic and stable. His A1c trending has been 5.4 - 5.4 - 6.0 today. He is on metformin thousand once a day although this is mostly for weight control. He checks his CBGs at home. However his meter has not been working recently. He continues to try to eat well and he is continuing to lose weight. He is agreeable to the PCV 13 pneumonia today. He wants to wait until summer to get his diabetic eye exam.  Plan : Continue metformin 1000 once a day

## 2016-04-19 NOTE — Telephone Encounter (Signed)
Pt calls stating he cannot get his pain med filled, called pharm, they have sent the PA paperwork, it was given just now to [facility]. Pt was made aware that it may take a few days to receive approval

## 2016-04-19 NOTE — Assessment & Plan Note (Signed)
This problem is chronic and stable. COPD is an empiric clinical diagnosis as she has not yet had PFTs. He is not yet interested in getting PFTs. He has an albuterol inhaler which she uses about 3 times a month with excellent results. He gets dyspneic occasionally. He has wheezing on examination today but that is chronic. I am concerned with exposure to chlorine that his breathing might get worse and I encouraged him that if this happens she decreased the himself with [facility]  Plan :Continue albuterol when necessary Encouraged completion of PFTs

## 2016-04-19 NOTE — Assessment & Plan Note (Signed)
This problem is chronic and stable. He is on a very slow Xanax taper. He is now getting 40 tablets every 30 days and states that sometimes he has to stretch it. He might take a half a pill but doesn't feel that that helps. He gets anxious sometimes in crowds or new places. He is trying not to let his environment set him. His [employer] is appropriate. Since [facility] I am not going to continue the taper at this time.  Plan : Continue Xanax 40 pills every 30 days

## 2016-04-20 LAB — CMP14 + ANION GAP
ALT: 167 IU/L — ABNORMAL HIGH (ref 0–44)
AST: 88 IU/L — ABNORMAL HIGH (ref 0–40)
Albumin/Globulin Ratio: 1.6 (ref 1.2–2.2)
Albumin: 4.4 g/dL (ref 3.5–5.5)
Alkaline Phosphatase: 169 IU/L — ABNORMAL HIGH (ref 39–117)
Anion Gap: 18 mmol/L (ref 10.0–18.0)
BUN / CREAT RATIO: 25 — AB (ref 9–20)
BUN: 27 mg/dL — ABNORMAL HIGH (ref 6–24)
Bilirubin Total: 0.9 mg/dL (ref 0.0–1.2)
CHLORIDE: 92 mmol/L — AB (ref 96–106)
CO2: 25 mmol/L (ref 18–29)
Calcium: 10 mg/dL (ref 8.7–10.2)
Creatinine, Ser: 1.08 mg/dL (ref 0.76–1.27)
GFR calc non Af Amer: 77 mL/min/{1.73_m2} (ref >59)
GFR, EST AFRICAN AMERICAN: 89 mL/min/{1.73_m2} (ref >59)
GLUCOSE: 185 mg/dL — AB (ref 65–99)
Globulin, Total: 2.8 g/dL (ref 1.5–4.5)
Potassium: 4.7 mmol/L (ref 3.5–5.2)
SODIUM: 135 mmol/L (ref 134–144)
TOTAL PROTEIN: 7.2 g/dL (ref 6.0–8.5)

## 2016-04-20 LAB — CBC
Hematocrit: 56 % — ABNORMAL HIGH (ref 37.5–51.0)
Hemoglobin: 18.4 g/dL — ABNORMAL HIGH (ref 13.0–17.7)
MCH: 30.6 pg (ref 26.6–33.0)
MCHC: 32.9 g/dL (ref 31.5–35.7)
MCV: 93 fL (ref 79–97)
PLATELETS: 171 10*3/uL (ref 150–379)
RBC: 6.01 x10E6/uL — ABNORMAL HIGH (ref 4.14–5.80)
RDW: 14.2 % (ref 12.3–15.4)
WBC: 8.9 10*3/uL (ref 3.4–10.8)

## 2016-04-20 LAB — LIPID PANEL
CHOLESTEROL TOTAL: 255 mg/dL — AB (ref 100–199)
Chol/HDL Ratio: 1.9 ratio units (ref 0.0–5.0)
HDL: 135 mg/dL (ref >39)
LDL CALC: 75 mg/dL (ref 0–99)
Triglycerides: 226 mg/dL — ABNORMAL HIGH (ref 0–149)
VLDL CHOLESTEROL CAL: 45 mg/dL — AB (ref 5–40)

## 2016-04-22 ENCOUNTER — Encounter: Payer: Self-pay | Admitting: Internal Medicine

## 2016-04-22 NOTE — Telephone Encounter (Signed)
This has been arranged and PA was approved today

## 2016-05-02 ENCOUNTER — Telehealth: Payer: Self-pay

## 2016-05-02 ENCOUNTER — Other Ambulatory Visit: Payer: Self-pay | Admitting: Internal Medicine

## 2016-05-02 DIAGNOSIS — E7800 Pure hypercholesterolemia, unspecified: Secondary | ICD-10-CM

## 2016-05-02 NOTE — Telephone Encounter (Signed)
Pt calls and states his rheumatologist would like lipids repeated in no less than 28 days, states he spoke to you about this could you please put the order in

## 2016-05-02 NOTE — Telephone Encounter (Signed)
Questions about lab result. Please call back.

## 2016-05-02 NOTE — Telephone Encounter (Signed)
Done  Triglycerides were high (non fasting). Repeat as fasting

## 2016-05-02 NOTE — Telephone Encounter (Signed)
Wants repeat results sent to dr Lenna Gilford

## 2016-05-23 ENCOUNTER — Other Ambulatory Visit: Payer: Medicaid Other

## 2016-05-24 ENCOUNTER — Other Ambulatory Visit (INDEPENDENT_AMBULATORY_CARE_PROVIDER_SITE_OTHER): Payer: Medicaid Other

## 2016-05-24 DIAGNOSIS — E7800 Pure hypercholesterolemia, unspecified: Secondary | ICD-10-CM | POA: Diagnosis present

## 2016-05-25 LAB — LIPID PANEL
CHOL/HDL RATIO: 2.7 ratio (ref 0.0–5.0)
Cholesterol, Total: 178 mg/dL (ref 100–199)
HDL: 66 mg/dL (ref >39)
LDL Calculated: 47 mg/dL (ref 0–99)
Triglycerides: 324 mg/dL — ABNORMAL HIGH (ref 0–149)
VLDL Cholesterol Cal: 65 mg/dL — ABNORMAL HIGH (ref 5–40)

## 2016-05-27 ENCOUNTER — Encounter: Payer: Self-pay | Admitting: Internal Medicine

## 2016-07-04 ENCOUNTER — Encounter (INDEPENDENT_AMBULATORY_CARE_PROVIDER_SITE_OTHER): Payer: Self-pay

## 2016-07-04 ENCOUNTER — Ambulatory Visit (INDEPENDENT_AMBULATORY_CARE_PROVIDER_SITE_OTHER): Payer: Medicaid Other | Admitting: Internal Medicine

## 2016-07-04 ENCOUNTER — Encounter: Payer: Self-pay | Admitting: Internal Medicine

## 2016-07-04 VITALS — BP 114/86 | HR 91 | Temp 97.7°F | Ht 71.0 in | Wt 266.8 lb

## 2016-07-04 DIAGNOSIS — B351 Tinea unguium: Secondary | ICD-10-CM | POA: Diagnosis not present

## 2016-07-04 DIAGNOSIS — L409 Psoriasis, unspecified: Secondary | ICD-10-CM

## 2016-07-04 DIAGNOSIS — F17210 Nicotine dependence, cigarettes, uncomplicated: Secondary | ICD-10-CM | POA: Diagnosis not present

## 2016-07-04 DIAGNOSIS — Z79899 Other long term (current) drug therapy: Secondary | ICD-10-CM

## 2016-07-04 DIAGNOSIS — E119 Type 2 diabetes mellitus without complications: Secondary | ICD-10-CM | POA: Diagnosis present

## 2016-07-04 DIAGNOSIS — M069 Rheumatoid arthritis, unspecified: Secondary | ICD-10-CM | POA: Diagnosis not present

## 2016-07-04 DIAGNOSIS — D751 Secondary polycythemia: Secondary | ICD-10-CM

## 2016-07-04 DIAGNOSIS — M1990 Unspecified osteoarthritis, unspecified site: Secondary | ICD-10-CM

## 2016-07-04 DIAGNOSIS — R238 Other skin changes: Secondary | ICD-10-CM | POA: Diagnosis not present

## 2016-07-04 DIAGNOSIS — Z79891 Long term (current) use of opiate analgesic: Secondary | ICD-10-CM | POA: Diagnosis not present

## 2016-07-04 DIAGNOSIS — J984 Other disorders of lung: Secondary | ICD-10-CM | POA: Diagnosis not present

## 2016-07-04 DIAGNOSIS — Z0000 Encounter for general adult medical examination without abnormal findings: Secondary | ICD-10-CM

## 2016-07-04 DIAGNOSIS — R208 Other disturbances of skin sensation: Secondary | ICD-10-CM

## 2016-07-04 DIAGNOSIS — Z6837 Body mass index (BMI) 37.0-37.9, adult: Secondary | ICD-10-CM | POA: Diagnosis not present

## 2016-07-04 DIAGNOSIS — G894 Chronic pain syndrome: Secondary | ICD-10-CM

## 2016-07-04 HISTORY — DX: Secondary polycythemia: D75.1

## 2016-07-04 LAB — POCT GLYCOSYLATED HEMOGLOBIN (HGB A1C): HEMOGLOBIN A1C: 5.4

## 2016-07-04 LAB — GLUCOSE, CAPILLARY: GLUCOSE-CAPILLARY: 108 mg/dL — AB (ref 65–99)

## 2016-07-04 MED ORDER — HYDROCODONE-ACETAMINOPHEN 10-325 MG PO TABS: 1.0000 | ORAL_TABLET | Freq: Four times a day (QID) | ORAL | 0 refills | Status: DC | PRN

## 2016-07-04 NOTE — Assessment & Plan Note (Signed)
Problem : Purplish cold feet  HPI : This problem is new. His feet are cold all the time and he wears socks all day long. His distal foot and toes are purplish and cold to the touch. He does have good DP pulses bilaterally. He has varicose vein in his lower legs bilaterally. He has no pain on his distal feet.  Assessment : Cool distal extremities. He does not have an arterial clot as he has no pain. He is at risk for peripheral arterial disease as he smokes, had a history of diabetes, history of hypertension, and has rheumatoid arthritis.  Plan : ABIs Continue aspirin and statin Continue risk factor management

## 2016-07-04 NOTE — Assessment & Plan Note (Signed)
This problem is chronic and stable. With weight loss, he was able to get his A1c under control and was able to come off all medicines. His A1c today is 5.4. He does have toenail fungus and is not able to cut his toenails or do other diabetic care. He wants to wait until the summer to get his diabetic eye examination.  Foot exam was completed today.  Plan : Referral to podiatry for diabetic foot care

## 2016-07-04 NOTE — Assessment & Plan Note (Signed)
This problem is new. I picked up on this during previous a planning. His hemoglobin is elevated at 16.4. It has been elevated slightly in the past but never to this level. He denies any erythromelalgia or itching after a shower. He has presumed COPD -he has never had PFTs. He has known sleep apnea although he does not use CPAP and has lost significant weight after the diagnosis Oxygenation was normal today. However his examination is always poor with wheezing. He is seeing his rheumatologist later this month and will be getting blood tests then and I encouraged him to ask that a CBC be added on so that we can follow his hemoglobin. It is not done, I will get a repeat hemoglobin at his next appointment in 3 months.  Assessment : Erythrocytosis likely secondary from COPD and tobacco use. He has no symptoms of polycythemia vera.  Plan : Repeat CBC If hemoglobin continues to be elevated he will need JAK 2 and EPO

## 2016-07-04 NOTE — Patient Instructions (Addendum)
1. See me in 3 months 2. I gave you 3 paper prescriptions for pain meds 3. Get the blood flow study 4. I sent you to the foot doctor 5. Use Bag balm or Vaseline on your feet 6. Pls ask Ms Pearline Cables to order a CBC (ICD R71.8) and fax to me

## 2016-07-04 NOTE — Progress Notes (Signed)
   Subjective:    Patient ID: Andrew Fowler, male    DOB: [birth year], 56 y.o.   MRN: [id number]  HPI  Andrew Fowler is here for pain F/U. Please see the A&P for the status of the pt's chronic medical problems.  ROS : per ROS section and in problem oriented charting. All other systems are negative.  PMHx, Soc hx, and / or Fam hx : He and his brother lives in a home owned by a cousin. This is a very stressful situation as she has 6 dogs is not always available to walk them. They will defecate and urinate in the home and the brother is expected to clean up. There will be unexpected guests that interrupt his routine including meal preparation and eating. They have a plan to get their own place in 3-4 months.  Review of Systems  Constitutional: Negative for activity change, appetite change and unexpected weight change.  Respiratory: Positive for wheezing.   Musculoskeletal: Positive for arthralgias, back pain and gait problem.  Psychiatric/Behavioral: The patient is nervous/anxious.        Objective:   Physical Exam  Constitutional: He appears well-developed and well-nourished. No distress.  HENT:  Head: Normocephalic and atraumatic.  Right Ear: External ear normal.  Left Ear: External ear normal.  Nose: Nose normal.  Eyes: EOM are normal.  Sclera injected bilaterally  Cardiovascular: Normal rate, regular rhythm and normal heart sounds.   Pulmonary/Chest: Effort normal. He has wheezes.  Musculoskeletal: He exhibits no edema or deformity.  Skin: Skin is warm and dry. He is not diaphoretic.  His feet are cold to the touch distally bilaterally. The distal feet are purplish. There is lichenification scaling on the plantar and lateral surfaces. No pustules. He has toenail fungus bilaterally. Pulses are +2 bilaterally.  Psychiatric: He has a normal mood and affect. His behavior is normal. Judgment and thought content normal.          Assessment & Plan:

## 2016-07-04 NOTE — Assessment & Plan Note (Signed)
This problem is chronic and stable. He uses at least 3.5 hydrocodone 10 mg a day. Sometimes he will use up to 4.5 a day. On average he uses 4 a day and gets 120 per 30 days.. He has never been able to go a full day without taking a single hydrocodone. The etiology of his pain is multifactorial including rheumatoid arthritis, osteoarthritis, obesity, and DDD. His [place] narcotic database is appropriate as is his urine drug screens. He has had no red flags or orange flags. His PEG scale is 8/9/8. Overall I think he is getting more benefit than risk from the hydrocodone. I did inform him that I would continue to ask about tapering at each and every appointment.  PLAN:  Cont current meds He got 3 papers prescriptions for his hydrocodone today

## 2016-07-04 NOTE — Assessment & Plan Note (Signed)
This problem is chronic and stable. The psoriasis on his buttock has pretty much healed. He does have the cream to use as needed if it flares up. He questions his feet today. On examination he does have hyperkeratotic changes that could be consistent with psoriasis. However, it could just be dry skin. I encouraged him to use bag balm or Vaseline to his feet daily and see if this often some of the hyperkeratotic changes. If not, we will reconsider at least a psoriatic changes to his feet. He has no psoriatic nail changes.  Plan : bag balm or Vaseline to feet daily

## 2016-07-04 NOTE — Assessment & Plan Note (Signed)
This problem is chronic and stable. He has had significant weight loss since 2014 although he gained back about 20 pounds. However his weight has recently been stable. He achieve this by diet changes mostly. He was not able to go to water aerobics as it was at [facility] and he did not have transportation. He has a new pair of Diabetic shoes which feel stable on the floor and now he is able to walk farther. He has good commitment for lifestyle changes. His BMI is now 37.  Plan : Based continue to follow

## 2016-07-05 ENCOUNTER — Telehealth: Payer: Self-pay | Admitting: *Deleted

## 2016-07-05 NOTE — Telephone Encounter (Signed)
Call made to patient to let him know that ABI appt is pending precert from insurance-no answer, message left on recorder.Despina Hidden Cassady4/13/20183:09 PM

## 2016-07-30 ENCOUNTER — Ambulatory Visit (HOSPITAL_COMMUNITY)
Admission: RE | Admit: 2016-07-30 | Discharge: 2016-07-30 | Disposition: A | Discharge status: Home / Self Care | Payer: Medicaid Other | Source: Ambulatory Visit | Attending: Internal Medicine | Admitting: Internal Medicine

## 2016-07-30 DIAGNOSIS — R0989 Other specified symptoms and signs involving the circulatory and respiratory systems: Secondary | ICD-10-CM | POA: Insufficient documentation

## 2016-07-30 DIAGNOSIS — E119 Type 2 diabetes mellitus without complications: Secondary | ICD-10-CM | POA: Insufficient documentation

## 2016-07-30 NOTE — Progress Notes (Signed)
Ankle Brachial Index  Right Brachial- 201, Tri Left Brachial- 194, Tri  Lower  Extremity Right Left  Dorsalis Pedis 233, Tri 215, Tri  Posterior Tibial 236, Tri 236, Tri  Ankle/Brachial Indices 1.2 1.2   Bilateral ABI's are within in normal range.  Lita Mains- RDMS, RVT 12:02 PM  07/30/2016

## 2016-07-31 ENCOUNTER — Ambulatory Visit: Payer: Medicaid Other | Admitting: Podiatry

## 2016-07-31 NOTE — Addendum Note (Signed)
Addended by: Hulan Fray on: 07/31/2016 06:22 PM   Modules accepted: Orders

## 2016-08-05 ENCOUNTER — Other Ambulatory Visit: Payer: Self-pay | Admitting: Internal Medicine

## 2016-09-26 ENCOUNTER — Other Ambulatory Visit: Payer: Self-pay

## 2016-09-26 DIAGNOSIS — G894 Chronic pain syndrome: Secondary | ICD-10-CM

## 2016-09-26 MED ORDER — HYDROCODONE-ACETAMINOPHEN 10-325 MG PO TABS: 1.0000 | ORAL_TABLET | Freq: Four times a day (QID) | ORAL | 0 refills | Status: DC | PRN

## 2016-09-26 NOTE — Telephone Encounter (Signed)
Pt informed, brother [name] Grobe will pick up 7/6

## 2016-09-26 NOTE — Telephone Encounter (Signed)
HYDROcodone-acetaminophen (NORCO) 10-325 MG tablet, REFILL REQUEST.

## 2016-10-04 ENCOUNTER — Telehealth: Payer: Self-pay | Admitting: *Deleted

## 2016-10-04 NOTE — Telephone Encounter (Signed)
Pt is calling back about med. Needs to speak with a nurse.

## 2016-10-04 NOTE — Telephone Encounter (Signed)
Pt needs PA on hydrocodone 10[facility]339m, [facility]

## 2016-10-04 NOTE — Telephone Encounter (Signed)
Reassured pt that as soon as we are given an answer we will inform the pharm and him

## 2016-10-04 NOTE — Telephone Encounter (Signed)
PA request submitted online via [employer].  Request sent for "review".Despina Hidden Cassady7/13/201812:28 PM      [id number] [facility] [id number] [place]  10/04/2016 SUSPENDED DMA Status: Pending Review

## 2016-10-07 NOTE — Telephone Encounter (Signed)
Call made to medicaid to check on the status of PA.  Pt's request was approved for 30-day supply only.  Per medicaid,  all reauthorizations for short acting analgesics will require documentation as to why patient still needs to be on medication.  Documentation must be faxed to [facility] (fax# [phone]) and include a plan of care.  Documentation must also include a completed PA form. [name], Darlene Cassady7/16/20184:43 PM

## 2016-10-10 ENCOUNTER — Telehealth: Payer: Self-pay

## 2016-10-10 NOTE — Telephone Encounter (Signed)
Asking to speak with Andrew Fowler. Please call pt back.  

## 2016-10-10 NOTE — Telephone Encounter (Signed)
Short acting opioid analgesic form and 07/04/2016 office notes faxed to [facility] fax# [phone].[name], Darlene Cassady7/19/20184:30 PM

## 2016-10-11 NOTE — Telephone Encounter (Signed)
Tried to call 7/19, no answer Spoke w/ him today, states he was in kitchen and ph was in bedroom when the call was made 7/19, he was just checking on pain med paperwork, informed him dr Software engineer is aware and it was being processed, he was agreeable

## 2016-10-18 ENCOUNTER — Inpatient Hospital Stay
Admit: 2016-10-18 | Discharge: 2016-10-18 | Disposition: A | Discharge status: Home / Self Care | Payer: BLUE CROSS/BLUE SHIELD | Attending: Emergency Medicine

## 2016-10-18 ENCOUNTER — Emergency Department: Admit: 2016-10-18 | Payer: BLUE CROSS/BLUE SHIELD | Primary: Family Medicine

## 2016-10-18 DIAGNOSIS — R1033 Periumbilical pain: Secondary | ICD-10-CM

## 2016-10-18 LAB — METABOLIC PANEL, COMPREHENSIVE
A-G Ratio: 0.8 — ABNORMAL LOW (ref 1.0–3.1)
ALT (SGPT): 32 U/L (ref 12–78)
AST (SGOT): 28 U/L (ref 15–37)
Albumin: 3.3 g/dL — ABNORMAL LOW (ref 3.4–5.0)
Alk. phosphatase: 71 U/L (ref 46–116)
Anion gap: 13 mmol/L (ref 10–17)
BUN/Creatinine ratio: 6 (ref 6.0–20.0)
BUN: 11 MG/DL (ref 7–18)
Bilirubin, total: 0.7 MG/DL (ref 0.2–1.0)
CO2: 27 mmol/L (ref 21–32)
Calcium: 8.2 MG/DL — ABNORMAL LOW (ref 8.5–10.1)
Chloride: 102 mmol/L (ref 98–107)
Creatinine: 1.84 MG/DL — ABNORMAL HIGH (ref 0.6–1.3)
GFR est AA: 49 mL/min/{1.73_m2} — ABNORMAL LOW (ref >60)
GFR est non-AA: 41 mL/min/{1.73_m2} — ABNORMAL LOW (ref >60)
Globulin: 3.9 g/dL
Glucose: 117 mg/dL — ABNORMAL HIGH (ref 74–106)
Potassium: 3.7 mmol/L (ref 3.5–5.1)
Protein, total: 7.2 g/dL (ref 6.4–8.2)
Sodium: 138 mmol/L (ref 136–145)

## 2016-10-18 LAB — URINALYSIS W/ RFLX MICROSCOPIC
Bilirubin: NEGATIVE
Blood: NEGATIVE
Glucose: NEGATIVE mg/dL
Ketone: NEGATIVE mg/dL
Leukocyte Esterase: NEGATIVE
Nitrites: NEGATIVE
Protein: NEGATIVE mg/dL
Specific gravity: 1.028
Urobilinogen: 0.2 EU/dL (ref 0.2–1.0)
pH (UA): 6 (ref 5.0–7.0)

## 2016-10-18 LAB — CBC WITH AUTOMATED DIFF
ABS. BASOPHILS: 0 10*3/uL (ref 0.0–0.2)
ABS. EOSINOPHILS: 0.1 10*3/uL (ref 0.0–0.7)
ABS. LYMPHOCYTES: 1.8 10*3/uL (ref 1.2–3.4)
ABS. MONOCYTES: 0.5 10*3/uL — ABNORMAL LOW (ref 1.1–3.2)
ABS. NEUTROPHILS: 1.9 10*3/uL (ref 1.4–6.5)
BASOPHILS: 1 % (ref 0–2)
EOSINOPHILS: 3 % (ref 0–5)
HCT: 42.1 % (ref 36.8–45.2)
HGB: 14.2 g/dL (ref 12.8–15.0)
IMMATURE GRANULOCYTES: 0 % (ref 0.0–5.0)
LYMPHOCYTES: 41 % — ABNORMAL HIGH (ref 16–40)
MCH: 30.6 PG (ref 27–31)
MCHC: 33.7 g/dL (ref 32–36)
MCV: 90.7 FL (ref 81–99)
MONOCYTES: 11 % (ref 0–12)
MPV: 10.2 FL (ref 7.4–10.4)
NEUTROPHILS: 44 % (ref 40–70)
PLATELET: 224 10*3/uL (ref 140–450)
RBC: 4.64 M/uL (ref 4.0–5.2)
RDW: 14 % (ref 11.5–14.5)
WBC: 4.3 10*3/uL — ABNORMAL LOW (ref 4.8–10.8)

## 2016-10-18 LAB — EKG, 12 LEAD, INITIAL
Atrial Rate: 53 {beats}/min
Calculated P Axis: 49 degrees
Calculated R Axis: 40 degrees
Calculated T Axis: 7 degrees
P-R Interval: 134 ms
Q-T Interval: 504 ms
QRS Duration: 84 ms
QTC Calculation (Bezet): 472 ms
Ventricular Rate: 53 {beats}/min

## 2016-10-18 LAB — LACTIC ACID: Lactic acid: 0.9 MMOL/L (ref 0.4–2.0)

## 2016-10-18 LAB — UA NO MICRO CHARGE B
Epithelial cells: 0 /hpf
RBC: NEGATIVE /hpf
WBC: NEGATIVE /hpf

## 2016-10-18 LAB — LIPASE: Lipase: 266 U/L — ABNORMAL HIGH (ref 65–230)

## 2016-10-18 LAB — EKG 12-LEAD
Atrial Rate: 53 {beats}/min
P Axis: 49 degrees
P-R Interval: 134 ms
Q-T Interval: 504 ms
QRS Duration: 84 ms
QTc Calculation (Bazett): 472 ms
R Axis: 40 degrees
T Axis: 7 degrees
Ventricular Rate: 53 {beats}/min

## 2016-10-18 MED ORDER — ONDANSETRON (PF) 4 MG/2 ML INJECTION
4 mg/2 mL | INTRAMUSCULAR | Status: AC
Start: 2016-10-18 — End: 2016-10-18
  Administered 2016-10-18: 05:00:00 via INTRAVENOUS

## 2016-10-18 MED ORDER — SODIUM CHLORIDE 0.9 % IJ SYRG
INTRAMUSCULAR | Status: DC | PRN
Start: 2016-10-18 — End: 2016-10-18

## 2016-10-18 MED ORDER — ACETAMINOPHEN 500 MG TAB
500 mg | ORAL | Status: AC
Start: 2016-10-18 — End: 2016-10-18
  Administered 2016-10-18: 08:00:00 via ORAL

## 2016-10-18 MED FILL — MAPAP EXTRA STRENGTH 500 MG TABLET: 500 mg | ORAL | Qty: 2

## 2016-10-18 MED FILL — ONDANSETRON (PF) 4 MG/2 ML INJECTION: 4 mg/2 mL | INTRAMUSCULAR | Qty: 2

## 2016-10-18 NOTE — ED Provider Notes (Signed)
HPI Comments: Franklin Romero is a 56 y.o. male with hx of sarcoidosis and HTN who arrived via EMS presents to the ED with complaint of N/V/D and generalized abdominal pain exacerbated by palpation which began 10/16/16 around 9 P.M. Pt reports that he has vomited 3x, the last time being 2 hours ago s/p eating chicken, and had diarrhea 2x, once this morning and evening. No black stools or bloody stools. Pt also complaining of tingling and pain in the hands and feet. No fever, melena, hematochezia, urinary symptoms, runny nose, or sore throat. Medication includes Vitamin D, prednisone 5 mg (no recent change), HCTZ, Tramadol, and ASA. PCP is Dr. Coumar(?sp) in [place]. Pt describes an episode consistent with an UGI bleed 2ry to esophageal tear requiring bld transfusions and an EGD in 2016.    Medical records reviewed - pt was seen at [facility] on 10/03/16 for dehydration. At the time he reported similar symptoms to today, minus the abd pain. He was diagnosed with heat exhaustion. His CK at the time was 670, and his Cr was 1.9. Th episode described in 2016 was due to a [employer] tear.    Patient is a 56 y.o. male presenting with abdominal pain. The history is provided by the patient and medical records. No language interpreter was used.   Abdominal Pain    This is a new problem. The current episode started 2 days ago. The problem has not changed since onset.The pain is associated with vomiting. The pain is located in the generalized abdominal region. The quality of the pain is aching. The pain is moderate. Associated symptoms include diarrhea, nausea and vomiting. Pertinent negatives include no fever, no hematochezia, no melena, no constipation, no dysuria, no frequency, no hematuria, no headaches, no chest pain and no back pain. The pain is worsened by palpation. Past workup includes esophagogastroduodenoscopy.  Past workup includes no surgery. The patient's surgical history non-contributory.        Past Medical History:   Diagnosis Date   ??? Sarcoidosis        History reviewed. No pertinent surgical history.      History reviewed. No pertinent family history.    Social History     Social History   ??? Marital status: N/A     Spouse name: N/A   ??? Number of children: N/A   ??? Years of education: N/A     Occupational History   ??? Not on file.     Social History Main Topics   ??? Smoking status: Never Smoker   ??? Smokeless tobacco: Never Used   ??? Alcohol use No   ??? Drug use: No   ??? Sexual activity: Not Currently     Other Topics Concern   ??? Not on file     Social History Narrative   ??? No narrative on file         ALLERGIES: Review of patient's allergies indicates no known allergies.    Review of Systems   Constitutional: Negative.  Negative for activity change, appetite change, diaphoresis and fever.   HENT: Negative.  Negative for mouth sores, rhinorrhea and sore throat.    Respiratory: Negative.  Negative for cough, chest tightness and shortness of breath.    Cardiovascular: Negative.  Negative for chest pain and leg swelling.   Gastrointestinal: Positive for abdominal pain, diarrhea, nausea and vomiting. Negative for blood in stool, constipation, hematochezia and melena.   Genitourinary: Negative.  Negative for difficulty urinating, dysuria, frequency  and hematuria.   Musculoskeletal: Negative.  Negative for back pain and joint swelling.   Skin: Negative.  Negative for rash and wound.   Allergic/Immunologic: Positive for immunocompromised state (sarcoidosis, chronic steroids). Negative for food allergies.   Neurological: Negative.  Negative for weakness and headaches.   All other systems reviewed and are negative.      Vitals:    10/18/16 0031   BP: (!) 151/96   Pulse: (!) 57   Resp: 18   Temp: 97.6 ??F (36.4 ??C)   SpO2: 98%   Weight: 137 kg (302 lb)   Height: 6\' 5"  (1.956 m)            Physical Exam   Constitutional: He is oriented to person, place, and time. He appears well-developed and well-nourished. No distress.    Well-appearing   HENT:   Head: Normocephalic and atraumatic.   Mouth/Throat: Oropharynx is clear and moist. Abnormal dentition.   Multiple missing teeth   Eyes: Conjunctivae are normal. Right eye exhibits no discharge. Left eye exhibits no discharge.       Neck: Normal range of motion. Neck supple.   Cardiovascular: Normal rate, regular rhythm and normal heart sounds.  Exam reveals no gallop and no friction rub.    No murmur heard.  Pulmonary/Chest: Effort normal and breath sounds normal. No respiratory distress. He has no wheezes. He has no rales. He exhibits no tenderness.   Abdominal: Soft. He exhibits no distension and no mass. There is tenderness in the periumbilical area. There is no rebound and no guarding.   Musculoskeletal: Normal range of motion. He exhibits no edema or tenderness.   Neurological: He is alert and oriented to person, place, and time.   Moving all extremities with no focality   Skin: Skin is warm and dry. No rash noted. He is not diaphoretic.   Nursing note and vitals reviewed.       MDM  Number of Diagnoses or Management Options  Diagnosis management comments: 56 y.o. male with hx of sarcoidosis on chronic steroids presenting to the ER with abd pain in the setting of nausea, vomiting and diarrhea. Pt is well appearing and nontoxic. Labs within normal. However, given immunocompromise a CT was requested to r/o diverticulitis. Negative. Pt tolerating PO. Pt for DC.  -----  12:59 AM  Documented by Tomasa RandEma S Pagliaroli, acting as a scribe for Dr. Lujean Raveanya F Nakai Yard, MD      PROVIDER ATTESTATION:  3:45 AM    The entirety of this note, signed by me, accurately reflects all works, treatments, procedures, and medical decision making performed by me, Dr Susie Cassetteanya Henchy Mccauley.        Patient Progress  Patient progress: improved      EKG 1:02 AM  [x]  Viewed by me [x]  Interpreted by me  Abnormal as below:   [x]  Normal Sinus Rhythm  []  Normal rate   [x]  Normal Axis  [x]  Normal ST/T waves  [x]  Normal QRS   [x]  Normal Intervals  []  Tachycardic @ rate of:            bpm  [x]  Bradycardic @ rate of:   53    bpm  []  Nonspecific T-wave inversions  []  Nonspecific ST changes   []  LVH    []  RBBB []  LBBB []  Paced   Interpretation: sinus bradycardia, normal axis, TWI in V1, otherwise no ST/T changes. No overt signs of acute ischemia or arrhythmia. no old EKG available for comparison.  CT Scan 3:30 AM  []  Head []  C-spine []  Chest  [x]   Abd/Pelvis  []   Other:   [x]  Viewed by me   []  Interpreted by me  [x]  No acute disease  []  Abnormal: see below []   Discussed with Radiologist     Interpretation: no diverticulitis or colitis. Possible Lt kidney cyst (incidental)    ED Course     Labs Reviewed   METABOLIC PANEL, COMPREHENSIVE - Abnormal; Notable for the following:        Result Value    Glucose 117 (*)     Creatinine 1.84 (*)     GFR est AA 49 (*)     GFR est non-AA 41 (*)     Calcium 8.2 (*)     Albumin 3.3 (*)     A-G Ratio 0.8 (*)     All other components within normal limits   LIPASE - Abnormal; Notable for the following:     Lipase 266 (*)     All other components within normal limits   CBC WITH AUTOMATED DIFF - Abnormal; Notable for the following:     WBC 4.3 (*)     LYMPHOCYTES 41 (*)     ABS. MONOCYTES 0.5 (*)     All other components within normal limits   URINALYSIS W/ RFLX MICROSCOPIC   LACTIC ACID       Ed Course:  2:23 AM  Pt reassessed -s till has LLQ tenderness. Given his immunocompromise status, t will receive an abd CT    3:46 AM  Pt tolerating PO. Pt for DC.    Procedures

## 2016-10-18 NOTE — ED Triage Notes (Signed)
PT arrived from home complaining of abdominal pain nausea vomiting and diarrhea

## 2016-10-18 NOTE — ED Notes (Signed)
I have reviewed discharge instructions with the patient.  The patient verbalized understanding.

## 2016-10-29 ENCOUNTER — Other Ambulatory Visit: Payer: Self-pay | Admitting: Internal Medicine

## 2016-10-31 ENCOUNTER — Ambulatory Visit (INDEPENDENT_AMBULATORY_CARE_PROVIDER_SITE_OTHER): Payer: Medicaid Other | Admitting: Internal Medicine

## 2016-10-31 VITALS — BP 157/101 | HR 91 | Temp 97.7°F | Wt 268.1 lb

## 2016-10-31 DIAGNOSIS — M549 Dorsalgia, unspecified: Secondary | ICD-10-CM | POA: Diagnosis not present

## 2016-10-31 DIAGNOSIS — I10 Essential (primary) hypertension: Secondary | ICD-10-CM | POA: Diagnosis not present

## 2016-10-31 DIAGNOSIS — Z6837 Body mass index (BMI) 37.0-37.9, adult: Secondary | ICD-10-CM

## 2016-10-31 DIAGNOSIS — R269 Unspecified abnormalities of gait and mobility: Secondary | ICD-10-CM | POA: Diagnosis not present

## 2016-10-31 DIAGNOSIS — Z79891 Long term (current) use of opiate analgesic: Secondary | ICD-10-CM

## 2016-10-31 DIAGNOSIS — J449 Chronic obstructive pulmonary disease, unspecified: Secondary | ICD-10-CM | POA: Diagnosis not present

## 2016-10-31 DIAGNOSIS — J410 Simple chronic bronchitis: Secondary | ICD-10-CM

## 2016-10-31 DIAGNOSIS — M2550 Pain in unspecified joint: Secondary | ICD-10-CM

## 2016-10-31 DIAGNOSIS — F418 Other specified anxiety disorders: Secondary | ICD-10-CM

## 2016-10-31 DIAGNOSIS — M7989 Other specified soft tissue disorders: Secondary | ICD-10-CM

## 2016-10-31 DIAGNOSIS — M791 Myalgia: Secondary | ICD-10-CM | POA: Diagnosis present

## 2016-10-31 DIAGNOSIS — K5900 Constipation, unspecified: Secondary | ICD-10-CM

## 2016-10-31 DIAGNOSIS — Z7984 Long term (current) use of oral hypoglycemic drugs: Secondary | ICD-10-CM

## 2016-10-31 DIAGNOSIS — F17210 Nicotine dependence, cigarettes, uncomplicated: Secondary | ICD-10-CM

## 2016-10-31 DIAGNOSIS — Z79899 Other long term (current) drug therapy: Secondary | ICD-10-CM

## 2016-10-31 DIAGNOSIS — E119 Type 2 diabetes mellitus without complications: Secondary | ICD-10-CM

## 2016-10-31 DIAGNOSIS — IMO0001 Reserved for inherently not codable concepts without codable children: Secondary | ICD-10-CM

## 2016-10-31 DIAGNOSIS — E6609 Other obesity due to excess calories: Secondary | ICD-10-CM

## 2016-10-31 DIAGNOSIS — Z0000 Encounter for general adult medical examination without abnormal findings: Secondary | ICD-10-CM

## 2016-10-31 DIAGNOSIS — D751 Secondary polycythemia: Secondary | ICD-10-CM

## 2016-10-31 MED ORDER — ALPRAZOLAM 0.5 MG PO TABS: ORAL_TABLET | ORAL | 5 refills | Status: DC

## 2016-10-31 NOTE — Assessment & Plan Note (Signed)
This problem is chronic and uncontrolled. He continues to smoke in his motivation is to just have something to do. I think his stressful living situation also contributes to his tobacco use. He has not wanted to undergo PFTs in the past but is agreeable to them if I recommend it today. He has an albuterol rescue inhaler to be used as needed. Every time I listened to him, he has wheezing, indicating lower airway bronchospasm, although he also has some audible wheezing without the stethoscope, indicating upper airway dysfunction. I think PFTs will be incredibly useful to characterize his lung function and need for additional inhaled medication. He is not yet ready to quit smoking.  PLAN : PFT's Continue albuterol rescue inhaler

## 2016-10-31 NOTE — Progress Notes (Signed)
   Subjective:    Patient ID: Andrew Fowler, male    DOB: 1960-06-24, 56 y.o.   MRN: [id number]  HPI  Andrew Fowler is here for pain F/U. Please see the A&P for the status of the pt's chronic medical problems.  ROS : per ROS section and in problem oriented charting. All other systems are negative.  PMHx, Soc hx, and / or Fam hx : Saw Dr Trudie Reed, Rheum, in June. Checks BP at home with upper arm cuff.  Review of Systems  Constitutional: Positive for diaphoresis.  Respiratory: Positive for wheezing.   Cardiovascular: Negative for chest pain and leg swelling.  Gastrointestinal: Positive for constipation.  Musculoskeletal: Positive for arthralgias, back pain, gait problem, joint swelling and myalgias.  Skin:       Feet are no longer cool Abnl toenails  Neurological: Positive for weakness.  Psychiatric/Behavioral: The patient is nervous/anxious.        Objective:   Physical Exam  Constitutional: He appears well-developed and well-nourished. No distress.  HENT:  Head: Normocephalic and atraumatic.  Right Ear: External ear normal.  Left Ear: External ear normal.  Nose: Nose normal.  Eyes: Conjunctivae and EOM are normal. Right eye exhibits no discharge. Left eye exhibits no discharge. No scleral icterus.  Pulmonary/Chest: Effort normal. No respiratory distress. He has wheezes.  Neurological: He is alert.  Skin: Skin is warm and dry. He is not diaphoretic.  Psychiatric: He has a normal mood and affect. His behavior is normal. Judgment and thought content normal.          Assessment & Plan:

## 2016-10-31 NOTE — Assessment & Plan Note (Signed)
This problem is chronic and stable. He is on no antihypertensive currently. He states he checks his blood pressure at home using an upper arm cuff and it is ranging 859-923 systolic and 96 - 414Q diastolic. He states when he had it checked in June at his rheumatology office that it was high. It is elevated today although it was normal in January and April. He had been on lisinopril past but it was stopped after he lost significant weight. I am not going to start lisinopril today but will check his blood pressure at apex appointment and if still elevated I will start lisinopril.  PLAN : Check blood pressure next appointment  BP Readings from Last 3 Encounters:  10/31/16 (!) 157/101  07/04/16 114/86  04/19/16 120/70

## 2016-10-31 NOTE — Assessment & Plan Note (Addendum)
This problem is chronic and stable. He had done exceptionally well losing weight with mostly dietary changes. His weight is now stable at 267. He continues to eat overall healthy. His BMI is 37.  PLAN : cont to follow and encourage

## 2016-10-31 NOTE — Assessment & Plan Note (Signed)
This problem is chronic and not worked up. He thinks that his rheumatologist checked a CBC and I will request those records. If she did not obtain a CBC, I will get one of the next appointment. It is possible that his elevated hemoglobin was simply a fluke as he has had a transiently elevated hemoglobin in the past.  PLAN : requests rheumatology records

## 2016-10-31 NOTE — Assessment & Plan Note (Signed)
He is able to ambulate about 25 feet turn around and walk back. After the 50 feet, it feels like his right knee is weak and is going to buckle. He tries to do this 3 times a week. He has been in a wheelchair for 7 years. We discussed that he could have muscle wasting and I encouraged him to use weights to strengthen the muscles in his lower extremities especially around his knee.

## 2016-10-31 NOTE — Patient Instructions (Addendum)
1. Pls make an appt late September / early October  2. I refilled your xanax

## 2016-10-31 NOTE — Assessment & Plan Note (Signed)
This problem is chronic and stable. He resolved his diabetes with weight loss and dietary changes. I still have diabetes on his list as he is at high risk. He got diabetic shoes and has noticed that they significantly improve his ambulation.   PLAN : Continue metformin which was mostly for weight A1c every 6-12 months

## 2016-10-31 NOTE — Assessment & Plan Note (Signed)
This problem is chronic and stable. He is on Xanax 0.5 mg and gets 40 her every 30 days. He takes on average one and a half pills every day although that may be split throughout the day. He takes at least one every day. He is having no side effects to this medication and it allows him to get out of public, go to [employer], and take public bus system. I think that the benefits outweigh the risk and he has had no red or orange flag behavior.  PLAN:  Cont current meds Refill Xanax 6 months

## 2016-11-01 ENCOUNTER — Telehealth: Payer: Self-pay | Admitting: *Deleted

## 2016-11-01 NOTE — Telephone Encounter (Signed)
Received call from respiratory regarding PFTorder.  MIP/MEP was checked "yes" on order, which is uncommon for our department.  [facility] usually requests full PFTs which includes Lung volume, DLCO, spirometry, pre and post bronchodilators. Will confirm with ordering MD to see if MIP/MEP is actually needed.  Respiratory will change order on their end-no need to place a new order.  Please advise.Despina Hidden Cassady8/10/201811:45 AM

## 2016-11-04 NOTE — Telephone Encounter (Signed)
MIP/MEP bc obese and WC so worried about some muscle weakness component. If thyey can only do PFT's, that's OK.

## 2016-11-06 LAB — MICROALBUMIN / CREATININE URINE RATIO
CREATININE, UR: 41.6 mg/dL
Microalb/Creat Ratio: 20.9 mg/g creat (ref 0.0–30.0)
Microalbumin, Urine: 8.7 ug/mL

## 2016-11-06 LAB — TOXASSURE SELECT,+ANTIDEPR,UR

## 2016-11-28 ENCOUNTER — Encounter (HOSPITAL_COMMUNITY): Payer: Medicaid Other

## 2016-12-03 ENCOUNTER — Encounter (HOSPITAL_COMMUNITY): Payer: Self-pay

## 2016-12-30 ENCOUNTER — Other Ambulatory Visit: Payer: Self-pay

## 2016-12-30 DIAGNOSIS — G894 Chronic pain syndrome: Secondary | ICD-10-CM

## 2016-12-30 MED ORDER — HYDROCODONE-ACETAMINOPHEN 10-325 MG PO TABS: 1.0000 | ORAL_TABLET | Freq: Four times a day (QID) | ORAL | 0 refills | Status: DC | PRN

## 2016-12-30 NOTE — Telephone Encounter (Signed)
Pt informed, brother [name] Koerner will pick up

## 2016-12-30 NOTE — Telephone Encounter (Signed)
Has future appt Can't access [place] controlled database UDS recent and OK except ETOH

## 2016-12-30 NOTE — Telephone Encounter (Signed)
HYDROcodone-acetaminophen (NORCO) 10-325 MG tablet   REFILL REQUEST.

## 2017-01-09 ENCOUNTER — Telehealth: Payer: Self-pay | Admitting: Internal Medicine

## 2017-01-09 ENCOUNTER — Ambulatory Visit: Payer: Medicaid Other | Admitting: Internal Medicine

## 2017-01-09 DIAGNOSIS — M05719 Rheumatoid arthritis with rheumatoid factor of unspecified shoulder without organ or systems involvement: Secondary | ICD-10-CM

## 2017-01-09 DIAGNOSIS — R269 Unspecified abnormalities of gait and mobility: Secondary | ICD-10-CM

## 2017-01-09 NOTE — Telephone Encounter (Signed)
Pt not feeling well this morning and is unable to come and has resch to 03/13/2017.  Pt is requesting a new Portable Potty Chair as his old one is getting weak on the legs.  Pt states it has been over 5 years and he is due a new. Pleas advise.

## 2017-01-10 ENCOUNTER — Encounter: Payer: Self-pay | Admitting: *Deleted

## 2017-01-10 LAB — HM DIABETES EYE EXAM

## 2017-01-15 NOTE — Telephone Encounter (Signed)
Faxed order to [facility] for a new Portable Potty Chair.  Please have patient call [phone] to f/u with this request.

## 2017-01-17 DIAGNOSIS — M79609 Pain in unspecified limb: Secondary | ICD-10-CM | POA: Diagnosis not present

## 2017-01-17 DIAGNOSIS — E139 Other specified diabetes mellitus without complications: Secondary | ICD-10-CM | POA: Diagnosis not present

## 2017-01-17 DIAGNOSIS — R2689 Other abnormalities of gait and mobility: Secondary | ICD-10-CM | POA: Diagnosis not present

## 2017-01-17 DIAGNOSIS — G4733 Obstructive sleep apnea (adult) (pediatric): Secondary | ICD-10-CM | POA: Diagnosis not present

## 2017-01-17 DIAGNOSIS — M0549 Rheumatoid myopathy with rheumatoid arthritis of multiple sites: Secondary | ICD-10-CM | POA: Diagnosis not present

## 2017-01-29 ENCOUNTER — Encounter: Payer: Self-pay | Admitting: *Deleted

## 2017-03-12 ENCOUNTER — Encounter (INDEPENDENT_AMBULATORY_CARE_PROVIDER_SITE_OTHER): Payer: Self-pay

## 2017-03-12 ENCOUNTER — Ambulatory Visit: Payer: Medicaid Other | Admitting: Internal Medicine

## 2017-03-12 VITALS — BP 141/91 | HR 94 | Wt 271.6 lb

## 2017-03-12 DIAGNOSIS — E7800 Pure hypercholesterolemia, unspecified: Secondary | ICD-10-CM

## 2017-03-12 DIAGNOSIS — R269 Unspecified abnormalities of gait and mobility: Secondary | ICD-10-CM

## 2017-03-12 DIAGNOSIS — E119 Type 2 diabetes mellitus without complications: Secondary | ICD-10-CM

## 2017-03-12 DIAGNOSIS — Z23 Encounter for immunization: Secondary | ICD-10-CM

## 2017-03-12 DIAGNOSIS — Z7984 Long term (current) use of oral hypoglycemic drugs: Secondary | ICD-10-CM | POA: Diagnosis not present

## 2017-03-12 DIAGNOSIS — R635 Abnormal weight gain: Secondary | ICD-10-CM | POA: Diagnosis not present

## 2017-03-12 DIAGNOSIS — Z6837 Body mass index (BMI) 37.0-37.9, adult: Secondary | ICD-10-CM | POA: Diagnosis not present

## 2017-03-12 DIAGNOSIS — Z79899 Other long term (current) drug therapy: Secondary | ICD-10-CM

## 2017-03-12 DIAGNOSIS — M549 Dorsalgia, unspecified: Secondary | ICD-10-CM | POA: Diagnosis not present

## 2017-03-12 DIAGNOSIS — G8929 Other chronic pain: Secondary | ICD-10-CM

## 2017-03-12 DIAGNOSIS — M791 Myalgia, unspecified site: Secondary | ICD-10-CM | POA: Diagnosis not present

## 2017-03-12 DIAGNOSIS — E785 Hyperlipidemia, unspecified: Secondary | ICD-10-CM | POA: Diagnosis not present

## 2017-03-12 DIAGNOSIS — D235 Other benign neoplasm of skin of trunk: Secondary | ICD-10-CM

## 2017-03-12 DIAGNOSIS — M2550 Pain in unspecified joint: Secondary | ICD-10-CM

## 2017-03-12 DIAGNOSIS — Z79891 Long term (current) use of opiate analgesic: Secondary | ICD-10-CM | POA: Diagnosis not present

## 2017-03-12 DIAGNOSIS — K7581 Nonalcoholic steatohepatitis (NASH): Secondary | ICD-10-CM

## 2017-03-12 DIAGNOSIS — F17200 Nicotine dependence, unspecified, uncomplicated: Secondary | ICD-10-CM

## 2017-03-12 DIAGNOSIS — M542 Cervicalgia: Secondary | ICD-10-CM | POA: Diagnosis not present

## 2017-03-12 DIAGNOSIS — I10 Essential (primary) hypertension: Secondary | ICD-10-CM

## 2017-03-12 DIAGNOSIS — F17210 Nicotine dependence, cigarettes, uncomplicated: Secondary | ICD-10-CM

## 2017-03-12 DIAGNOSIS — G894 Chronic pain syndrome: Secondary | ICD-10-CM

## 2017-03-12 DIAGNOSIS — Z0000 Encounter for general adult medical examination without abnormal findings: Secondary | ICD-10-CM

## 2017-03-12 LAB — POCT GLYCOSYLATED HEMOGLOBIN (HGB A1C): Hemoglobin A1C: 5.4

## 2017-03-12 LAB — GLUCOSE, CAPILLARY: GLUCOSE-CAPILLARY: 94 mg/dL (ref 65–99)

## 2017-03-12 MED ORDER — ALPRAZOLAM 0.5 MG PO TABS: ORAL_TABLET | ORAL | 5 refills | Status: DC

## 2017-03-12 MED ORDER — SIMVASTATIN 40 MG PO TABS: 40.0000 mg | ORAL_TABLET | Freq: Every day | ORAL | 3 refills | Status: DC

## 2017-03-12 MED ORDER — TIOTROPIUM BROMIDE MONOHYDRATE 2.5 MCG/ACT IN AERS: 2.5000 ug | INHALATION_SPRAY | Freq: Every day | RESPIRATORY_TRACT | 11 refills | Status: DC

## 2017-03-12 MED ORDER — HYDROCODONE-ACETAMINOPHEN 10-325 MG PO TABS: 1.0000 | ORAL_TABLET | Freq: Four times a day (QID) | ORAL | 0 refills | Status: DC | PRN

## 2017-03-12 MED ORDER — METFORMIN HCL 1000 MG PO TABS: 1000.0000 mg | ORAL_TABLET | Freq: Every day | ORAL | 11 refills | Status: DC

## 2017-03-12 MED ORDER — LISINOPRIL 10 MG PO TABS: 10.0000 mg | ORAL_TABLET | Freq: Every day | ORAL | 11 refills | Status: DC

## 2017-03-12 NOTE — Progress Notes (Signed)
   Subjective:    Patient ID: Andrew Fowler, male    DOB: Apr 17, 1960, 56 y.o.   MRN: [id number]  HPI  FODAY CONE is here for chronic pain F/U. Please see the A&P for the status of the pt's chronic medical problems.  ROS : per ROS section and in problem oriented charting. All other systems are negative.  PMHx, Soc hx, and / or Fam hx : He continues to live with his cousin and his wife whose sides and argue all the time. He also has children and many dogs in the house. This is a very stressful living situation.  Review of Systems  Constitutional: Positive for unexpected weight change.  Musculoskeletal: Positive for arthralgias, back pain, gait problem, myalgias and neck pain.  Skin:       Lesion on left upper back       Objective:   Physical Exam  Constitutional: He appears well-developed and well-nourished. No distress.  HENT:  Head: Normocephalic and atraumatic.  Right Ear: External ear normal.  Left Ear: External ear normal.  Nose: Nose normal.  Eyes: Conjunctivae and EOM are normal. Right eye exhibits no discharge. Left eye exhibits no discharge. No scleral icterus.  Cardiovascular: Normal rate, regular rhythm and normal heart sounds.  No murmur heard. Pulmonary/Chest: Effort normal. He has wheezes. He has no rales.  Skin: Skin is warm and dry. He is not diaphoretic.  On the left upper back over the scapula there is a 2 cm fluctuant cyst like lesion. There is minimal pinkness over the cyst but no cellulitis. Medial to this lesion there is a smaller 1 cm cyst like lesion without any erythema.  Psychiatric: He has a normal mood and affect. His behavior is normal. Judgment and thought content normal.      Assessment & Plan:

## 2017-03-12 NOTE — Patient Instructions (Signed)
1. Please see me in 3 months 2. I gave you three refills of your pain medicine today 3. You got vaccinated against hepatitis B and A and flu

## 2017-03-12 NOTE — Assessment & Plan Note (Signed)
He got his flu vaccine today. He will need pneumonia 23. Has next appointment. He will need to complete his hepatitis B vaccine series and also get hepatitis A

## 2017-03-12 NOTE — Assessment & Plan Note (Signed)
This problem is chronic and stable. We discussed that his hepatitis b antibody was negative indicating he does not have immunity to hepatitis b. We discussed that a viral hepatitis on top of his NASH would be devastating. He is in agreement to get hepatitis B vaccine today. He did not want hepatitis A vaccine today but will consider in the future.  PLAN : Continue to follow weights Hepatitis B vaccine today

## 2017-03-12 NOTE — Assessment & Plan Note (Signed)
This problem is chronic and stable. He continues to use to or 3 hydrocodone a day. He states he rarely has pills remaining at the end of the month. Therefore, since he is getting 120 per 30 days, he is averaging 4 a day. His symptoms always get worse in the cold weather. His last UDS was appropriate with the exception of some alcohol. He has now gotten diabetic shoes and feels that these are making walking much much easier. There has never been any red flags or orange flag behavior and I feel the risk of chronic opioids is less then the benefits of chronic opioids. I reviewed his [place] results yesterday and they were appropriate. He understands that I will be asking about tapering every appointment.  PLAN:  Cont current meds

## 2017-03-12 NOTE — Assessment & Plan Note (Signed)
This problem is chronic and stable. He is on simvastatin 40 once a day for primary prevention. He has no side effects to this medication.     PLAN:  Cont current meds

## 2017-03-12 NOTE — Assessment & Plan Note (Signed)
This problem is chronic and stable. He was able to basically cure his diabetes with his significant weight loss. A1c today is 5.4. He remains on metformin 1000 daily not for glycemic control but for weight control.  PLAN:  Cont current meds

## 2017-03-12 NOTE — Assessment & Plan Note (Signed)
This problem is chronic and worsened. We had to stop his antihypertensive, lisinopril 10 mg, when he lost significant weight. His weight has gone up 16 pounds since his nadir and his blood pressure has also increased the past 2 appointments. Additionally, he checks it at home, appropriately fall at rest, and it is elevated consistently at home. Therefore we are resuming lisinopril 10 mg which he tolerated quite well in the past.  PLAN : Lisinopril 10 mg a day Return to clinic 3 months To call if she has any side effects to this medication

## 2017-03-12 NOTE — Assessment & Plan Note (Signed)
He is no longer using Chantix or NicoDerm patch. He is smoking up to one pack per day. He is not yet ready to quit due to stress in his home environment. He wheezes every time I listen to him. We used Spiriva dry powder inhaler in 2013 which he did not like. He is willing to consider the Spiriva respimat and we watched the video today on how to use this medication.  PLAN : Continue asked about smoking cessation and every appointment Spiriva Respimat

## 2017-03-13 ENCOUNTER — Ambulatory Visit: Payer: Medicaid Other | Admitting: Internal Medicine

## 2017-04-15 ENCOUNTER — Ambulatory Visit (INDEPENDENT_AMBULATORY_CARE_PROVIDER_SITE_OTHER): Payer: Medicaid Other | Admitting: *Deleted

## 2017-04-15 DIAGNOSIS — Z23 Encounter for immunization: Secondary | ICD-10-CM | POA: Diagnosis present

## 2017-04-23 ENCOUNTER — Encounter: Payer: Self-pay | Admitting: Internal Medicine

## 2017-05-02 ENCOUNTER — Encounter: Payer: Self-pay | Admitting: *Deleted

## 2017-05-02 NOTE — Progress Notes (Signed)
IFOBT kit sent by mail per Dr. Butcher.  VBarrow 05/02/17 

## 2017-05-05 ENCOUNTER — Telehealth: Payer: Self-pay | Admitting: Internal Medicine

## 2017-05-05 NOTE — Telephone Encounter (Signed)
Patient is requesting refills on pain medicine

## 2017-05-05 NOTE — Telephone Encounter (Signed)
Called pharm, has scripts but needs PA

## 2017-05-06 NOTE — Telephone Encounter (Signed)
Patient is calling check on medicine

## 2017-05-06 NOTE — Telephone Encounter (Addendum)
Forms for PA were sent to [place] for Hydrocodone/APAP.  Awaitng decision from [employer].  Sander Nephew, RN 05/06/2017 2:56 PM.  Call to [facility] Tracks Hydrocodone Acetaminophen was approved  05/06/2017 thru 11/02/2017.  Sharol Harness  05/08/2017.

## 2017-05-09 ENCOUNTER — Telehealth: Payer: Self-pay | Admitting: *Deleted

## 2017-05-09 NOTE — Telephone Encounter (Signed)
Information was faxed to [place] for PA request for Hydrocodone-Acetaminophen 10/325.  Sharol Harness 05/05/2017 3:45 PM Call to [place] PA was approved 05/06/2017 thru 11/02/2017.  Sander Nephew, RN 05/09/2017 3:59 AM.

## 2017-06-02 ENCOUNTER — Other Ambulatory Visit: Payer: Self-pay

## 2017-06-02 DIAGNOSIS — G894 Chronic pain syndrome: Secondary | ICD-10-CM

## 2017-06-02 MED ORDER — HYDROCODONE-ACETAMINOPHEN 10-325 MG PO TABS: 1.0000 | ORAL_TABLET | Freq: Four times a day (QID) | ORAL | 0 refills | Status: DC | PRN

## 2017-06-02 NOTE — Telephone Encounter (Signed)
HYDROcodone-acetaminophen (NORCO) 10-325 MG tablet   Refill request.

## 2017-06-19 ENCOUNTER — Encounter: Payer: Self-pay | Admitting: Internal Medicine

## 2017-06-19 ENCOUNTER — Other Ambulatory Visit: Payer: Self-pay

## 2017-06-19 ENCOUNTER — Ambulatory Visit: Payer: Medicaid Other | Admitting: Internal Medicine

## 2017-06-19 VITALS — BP 110/69 | HR 98 | Wt 266.5 lb

## 2017-06-19 DIAGNOSIS — I10 Essential (primary) hypertension: Secondary | ICD-10-CM | POA: Diagnosis not present

## 2017-06-19 DIAGNOSIS — F418 Other specified anxiety disorders: Secondary | ICD-10-CM

## 2017-06-19 DIAGNOSIS — F17200 Nicotine dependence, unspecified, uncomplicated: Secondary | ICD-10-CM

## 2017-06-19 DIAGNOSIS — Z6837 Body mass index (BMI) 37.0-37.9, adult: Secondary | ICD-10-CM

## 2017-06-19 DIAGNOSIS — Z79899 Other long term (current) drug therapy: Secondary | ICD-10-CM

## 2017-06-19 DIAGNOSIS — E119 Type 2 diabetes mellitus without complications: Secondary | ICD-10-CM | POA: Diagnosis not present

## 2017-06-19 DIAGNOSIS — Z79891 Long term (current) use of opiate analgesic: Secondary | ICD-10-CM

## 2017-06-19 DIAGNOSIS — B351 Tinea unguium: Secondary | ICD-10-CM | POA: Diagnosis not present

## 2017-06-19 DIAGNOSIS — E669 Obesity, unspecified: Secondary | ICD-10-CM

## 2017-06-19 DIAGNOSIS — Z0000 Encounter for general adult medical examination without abnormal findings: Secondary | ICD-10-CM

## 2017-06-19 DIAGNOSIS — L409 Psoriasis, unspecified: Secondary | ICD-10-CM

## 2017-06-19 DIAGNOSIS — G4733 Obstructive sleep apnea (adult) (pediatric): Secondary | ICD-10-CM

## 2017-06-19 DIAGNOSIS — F419 Anxiety disorder, unspecified: Secondary | ICD-10-CM | POA: Diagnosis not present

## 2017-06-19 DIAGNOSIS — E118 Type 2 diabetes mellitus with unspecified complications: Secondary | ICD-10-CM

## 2017-06-19 DIAGNOSIS — Z7984 Long term (current) use of oral hypoglycemic drugs: Secondary | ICD-10-CM

## 2017-06-19 DIAGNOSIS — D751 Secondary polycythemia: Secondary | ICD-10-CM

## 2017-06-19 DIAGNOSIS — M069 Rheumatoid arthritis, unspecified: Secondary | ICD-10-CM

## 2017-06-19 DIAGNOSIS — G8929 Other chronic pain: Secondary | ICD-10-CM

## 2017-06-19 DIAGNOSIS — M25561 Pain in right knee: Secondary | ICD-10-CM | POA: Diagnosis not present

## 2017-06-19 DIAGNOSIS — F17210 Nicotine dependence, cigarettes, uncomplicated: Secondary | ICD-10-CM

## 2017-06-19 MED ORDER — BUPROPION HCL ER (SR) 150 MG PO TB12: ORAL_TABLET | ORAL | 0 refills | Status: DC

## 2017-06-19 NOTE — Assessment & Plan Note (Signed)
This problem is chronic and stable.  We reviewed the proper usage of his steroid cream.  He is being very judicious in its usage and washing his hands.  PLAN:  Cont current meds

## 2017-06-19 NOTE — Assessment & Plan Note (Signed)
We discussed some of the new literature regarding aspirin for primary prevention.  He has not had a stroke, cardiac stents, or an AMI.  He is at high risk for cardiovascular disease due to his smoking, obesity, diabetes, and hypertension, he is more worried about a brain bleed from his aspirin than he is about a cardiovascular event so together we decided he would stop his aspirin.  He was prescribed Retin-A a year or 2 ago for seborrheic dermatitis on his forehead.  He is no longer using this and I removed it from his medication list.

## 2017-06-19 NOTE — Patient Instructions (Addendum)
1. Please see Dr Lynnae January in June for chronic pain follow up 2. I sent in generic of zyban for smoking 3. Get the breathing test 4. Thank about sleeping test 5. Ask [facility] about next two months of pain meds

## 2017-06-19 NOTE — Assessment & Plan Note (Addendum)
This problem is chronic and stable.  He has lost 5 pounds since his last appointment in December, decreasing from 271-266. Weight has been stable for a few yrs.  PLAN : follow   Wt Readings from Last 3 Encounters:  06/19/17 266 lb 8 oz (120.9 kg)  03/12/17 271 lb 9.6 oz (123.2 kg)  10/31/16 268 lb 1.6 oz (121.6 kg)

## 2017-06-19 NOTE — Assessment & Plan Note (Signed)
This problem is chronic and stable.  I had sent in 3 months of his hydrocodone on 11 March.  He will need a refill in June.  4 weeks ago, his left leg felt weak and he had a controlled fall onto his left hip onto the couch.  He has been weightbearing since.  Occasionally when he sleeps on his left side the area will get irritated.  He also is having right knee pain.  This is been chronic with occasional flares.  He sleeps with a pillow under the right knee and if the pillow gets moved, his knee will hurt.  He has gotten steroid injections into the right knee at his rheumatologist office previously.  PLAN : Cont hydrocodone

## 2017-06-19 NOTE — Assessment & Plan Note (Addendum)
This problem is chronic and stable. He was restarted on lisinopril 10 in Dec for elevated BP.  His blood pressure is at goal today.  He is having no side effects to this medication.  He stated he got blood work last month at his rheumatologist office so I am going to obtain those results rather than getting a BMP today.  PLAN:  Cont current meds    BP Readings from Last 3 Encounters:  06/19/17 110/69  03/12/17 (!) 141/91  10/31/16 (!) 157/101

## 2017-06-19 NOTE — Assessment & Plan Note (Signed)
Request CBC from rheum.

## 2017-06-19 NOTE — Assessment & Plan Note (Signed)
This problem is chronic and stable.  I am only getting A1c about every 6 months.  He remains on his metformin at thousand twice daily at my suggestion to help with maintaining a neutral weight.  His A1c was 5.4 in December 2019 and I do not think he has any condition that would falsely lower his A1C.  His fasting CBGs at home are ranging from 97-112.  He denies any hypoglycemia episodes.  He has difficulty cutting his nails.  Due to his diabetes, I am referring him to podiatry for diabetic foot care and assessment of his onychomycosis.  He has had ABIs at that a year ago which were normal.  PLAN : A1C q 6 months Podiatry referral Met 1000 BID 

## 2017-06-19 NOTE — Assessment & Plan Note (Signed)
This problem is chronic and untreated.  He has had significant weight loss since his sleep study.  He is not interested in getting a repeat sleep study at this time.  He states he will think about it.  PLAN : cont to ask about sleep study

## 2017-06-19 NOTE — Assessment & Plan Note (Signed)
This time is chronic and stable.  He is on #40 of Xanax 0.5 mg a month and states he is doing well on this. Uses before bed because "brain will not shut down".  He does not worry or have anxiety over any thing - he just thinks a lot of different things.  The Xanax helps him to fall asleep very quickly as otherwise he would toss and turn for hours.  He does state that he has had panic attacks in the past.  I considered the diagnosis of general anxiety disorder but he denies worry or anxiety over any issues.  He uses a Xanax, in addition to sleeping, to deal with frustrations about going out in public, being in crowds, being on the bus, and dealing with kids who come to his home.  I might consider broaching a longer acting benzo like Ativan or Klonopin in the he states that for a few years, future but right now he is having no side effects to Xanax and is using both his Xanax and his hydrocodone appropriately without any red flags.  He will get episodes that last 5 minutes or so where he feels hot all over and clammy and sweaty.  He denies anxiety, worry, fear, palpitations, chest pain, dyspnea.  He checks his blood pressure and his CBG and both are normal.  This can occur outside his home and also at home when he is resting.  He can turn on a fan which will immediately and his symptoms.  His cousin, who owns the home, keeps the temperature set at 78 degrees and he prefers the 10 to critically at 68 or so.  Opioids can cause the symptoms.  I do not think that this is a panic attack as he has no panic or fear.  This could simply be an environmental temperature if you.  We will continue to follow up.

## 2017-06-19 NOTE — Progress Notes (Addendum)
   Subjective:    Patient ID: Andrew Fowler, male    DOB: May 06, 1960, 57 y.o.   MRN: [id number]  HPI  MOSTAFA YUAN is here for chronic pain F/U. Please see the A&P for the status of the pt's chronic medical problems.  ROS : per ROS section and in problem oriented charting. All other systems are negative.  PMHx, Soc hx, and / or Fam hx : Smoking 0.5 + PPD. No personal h/o CVA, AMI, cardiac stent  Review of Systems  Constitutional: Negative for unexpected weight change.  Respiratory: Positive for shortness of breath and wheezing.   Endocrine: Positive for heat intolerance.  Musculoskeletal: Positive for arthralgias, back pain, gait problem and joint swelling.  Skin: Positive for rash.  Neurological: Positive for weakness. Negative for dizziness and light-headedness.  Psychiatric/Behavioral: Positive for sleep disturbance.       Objective:   Physical Exam  Constitutional: He appears well-developed and well-nourished. No distress.  HENT:  Head: Normocephalic and atraumatic.  Right Ear: External ear normal.  Left Ear: External ear normal.  Nose: Nose normal.  Eyes: Conjunctivae and EOM are normal. Right eye exhibits no discharge. Left eye exhibits no discharge. No scleral icterus.  Cardiovascular: Normal rate, regular rhythm and normal heart sounds.  Heart sounds obscured by wheezing +2 DP pulses B  Pulmonary/Chest: Effort normal. No respiratory distress. He has wheezes.  Exp wheezing both with and without stethoscope  Musculoskeletal: He exhibits no edema, tenderness or deformity.  Neurological: He is alert.  Skin: Skin is warm and dry. He is not diaphoretic.  +onchyomycosis   Psychiatric: He has a normal mood and affect. His behavior is normal. Judgment and thought content normal.       Assessment & Plan:  Per insurance, the pt is eligible for a new bed, mattress, and gel overlay.

## 2017-06-19 NOTE — Addendum Note (Signed)
Addended by: Larey Dresser A on: 06/19/2017 01:47 PM   Modules accepted: Orders

## 2017-06-19 NOTE — Assessment & Plan Note (Signed)
This problem is chronic and uncontrolled.  He is smoking a little over half a pack a day.  He has made attempts to quit smoking both with and without pharmaceuticals.  He has tried Chantix and nicotine replacement therapy without success.  He is finding it very hard to quit tobacco.  He asked about Zyban.  We have not tried this medication in him.  I discussed that I would prescribe bupropion which is an antidepressant also used for tobacco cessation.  He will start 150 daily for 3 days then go to 150 twice daily.  I encouraged him to give it a 24-monthtrial and we reviewed the most common side effects.  I verified that this is a [facility]  He is willing to get PFTs.  The order is in and he simply needs to call to make an appointment.  He had both audible wheezing without a stethoscope, indicating possible upper airway dysfunction, and wheezing with a stethoscope on expiration indicating a lower airway source of wheezing.  I had tried Spiriva at his December appointment and made certain I prescribed the Respimat as he was not interested in a dry powdered inhaler.  He states he got a dry inhaler that made his mouth and throat dry and did not continue to use it.  He has an albuterol rescue inhaler which he uses "infrequently".  PLAN : buproprion 150 QD 3 days then BID PFT's

## 2017-06-26 ENCOUNTER — Other Ambulatory Visit: Payer: Self-pay

## 2017-06-26 ENCOUNTER — Ambulatory Visit: Payer: Medicaid Other | Admitting: Internal Medicine

## 2017-06-26 ENCOUNTER — Encounter: Payer: Self-pay | Admitting: Internal Medicine

## 2017-06-26 DIAGNOSIS — M069 Rheumatoid arthritis, unspecified: Secondary | ICD-10-CM | POA: Diagnosis not present

## 2017-06-26 DIAGNOSIS — M5136 Other intervertebral disc degeneration, lumbar region: Secondary | ICD-10-CM

## 2017-06-26 DIAGNOSIS — R0602 Shortness of breath: Secondary | ICD-10-CM | POA: Diagnosis not present

## 2017-06-26 DIAGNOSIS — Z79891 Long term (current) use of opiate analgesic: Secondary | ICD-10-CM | POA: Diagnosis not present

## 2017-06-26 DIAGNOSIS — F17200 Nicotine dependence, unspecified, uncomplicated: Secondary | ICD-10-CM

## 2017-06-26 DIAGNOSIS — E119 Type 2 diabetes mellitus without complications: Secondary | ICD-10-CM

## 2017-06-26 DIAGNOSIS — M1990 Unspecified osteoarthritis, unspecified site: Secondary | ICD-10-CM | POA: Diagnosis not present

## 2017-06-26 DIAGNOSIS — M549 Dorsalgia, unspecified: Secondary | ICD-10-CM

## 2017-06-26 DIAGNOSIS — M542 Cervicalgia: Secondary | ICD-10-CM | POA: Diagnosis not present

## 2017-06-26 DIAGNOSIS — G8929 Other chronic pain: Secondary | ICD-10-CM | POA: Diagnosis not present

## 2017-06-26 DIAGNOSIS — R269 Unspecified abnormalities of gait and mobility: Secondary | ICD-10-CM

## 2017-06-26 DIAGNOSIS — E669 Obesity, unspecified: Secondary | ICD-10-CM

## 2017-06-26 DIAGNOSIS — Z7984 Long term (current) use of oral hypoglycemic drugs: Secondary | ICD-10-CM

## 2017-06-26 DIAGNOSIS — M6281 Muscle weakness (generalized): Secondary | ICD-10-CM | POA: Diagnosis not present

## 2017-06-26 DIAGNOSIS — Z79899 Other long term (current) drug therapy: Secondary | ICD-10-CM

## 2017-06-26 DIAGNOSIS — Z6836 Body mass index (BMI) 36.0-36.9, adult: Secondary | ICD-10-CM

## 2017-06-26 NOTE — Assessment & Plan Note (Signed)
This problem is chronic and uncontrolled. On day 3 of bupropion. No SE. Zenia Resides at pharmacy discussed potential SE and he is reading through the handouts.  PLAN:  Cont current meds

## 2017-06-26 NOTE — Assessment & Plan Note (Addendum)
Due to the patient's girth, the patient requires a bariatric bed. Due to current medical condition of chronic pain from RA, obesity, OA, and DDD the patient requires positioning of the body in ways not feasible with an ordinary bed. Pillows and wedges have been considered and ruled out. Patient requires a bed height different than a fixed height hospital bed to permit allow getting into and out of the bed. The patient also requires frequent changes in body position due to their condition. The hospital bed will prevent Mr Hevener' pain from worsening by allowing better positioning and repositioning and will help to alleviate some of his chronic pain. BMI is 36.7. Height is 5' 11" . Head and feet of bed needs to be adjustable for pain relief and needs to be adjusted by remote control bc cannot raise of lower head /foot manually due to pain. Requires gel overlay to temperature moderation.   Requires reaching / grab stick to assist in ADL's - dressing, bathing, food preparation.  PLAN : bariatric hospital bed with adjustable height via remote control.  Reaching  /grap stick He verified he has 2 additional rx for hydrocodone at the pharmacy

## 2017-06-26 NOTE — Progress Notes (Signed)
   Subjective:    Patient ID: Andrew Fowler, male    DOB: [birth year], 57 y.o.   MRN: [id number]  HPI  Andrew Fowler is here for face to face for hospital bed. Please see the A&P for the status of the pt's chronic medical problems.  ROS : per ROS section and in problem oriented charting. All other systems are negative.  PMHx, Soc hx, and / or Fam hx :  Takes public transportation which causes anxiety. Able to stand and ambulate a few steps but ambulation limited by pain and deconditioning   Review of Systems  Constitutional: Negative for activity change, appetite change and unexpected weight change.  Respiratory: Positive for shortness of breath.   Musculoskeletal: Positive for arthralgias, back pain, gait problem and neck pain.       Objective:   Physical Exam  Constitutional: He appears well-developed and well-nourished. No distress.  HENT:  Head: Normocephalic and atraumatic.  Right Ear: External ear normal.  Left Ear: External ear normal.  Eyes: Conjunctivae and EOM are normal. Right eye exhibits no discharge. Left eye exhibits no discharge. No scleral icterus.  Skin: Skin is dry. He is not diaphoretic. No erythema.  Psychiatric: He has a normal mood and affect. His behavior is normal. Judgment and thought content normal.          Assessment & Plan:

## 2017-06-26 NOTE — Assessment & Plan Note (Signed)
Fasting CBD 99 this AM. On metformin, mostly for the weight neutral benefits. Has made many improvement in diet and we discussed lower carb today to help with NAFLD.  PLAN : cont metformin Lower card, heart healthy diet

## 2017-06-26 NOTE — Patient Instructions (Signed)
1. I have ordered your hospital bed 2. Pls see Dr Lynnae January end of June / early July chronic pain F/U

## 2017-06-29 IMAGING — CR DG KNEE COMPLETE 4+V*R*
4 series · 4 of 4 positions shown · non-contrast
Comparison: No prior exams available for comparison.

CLINICAL DATA: Chronic right knee pain.

EXAM:
RIGHT KNEE - COMPLETE 4+ VIEW

[t knee ap right]
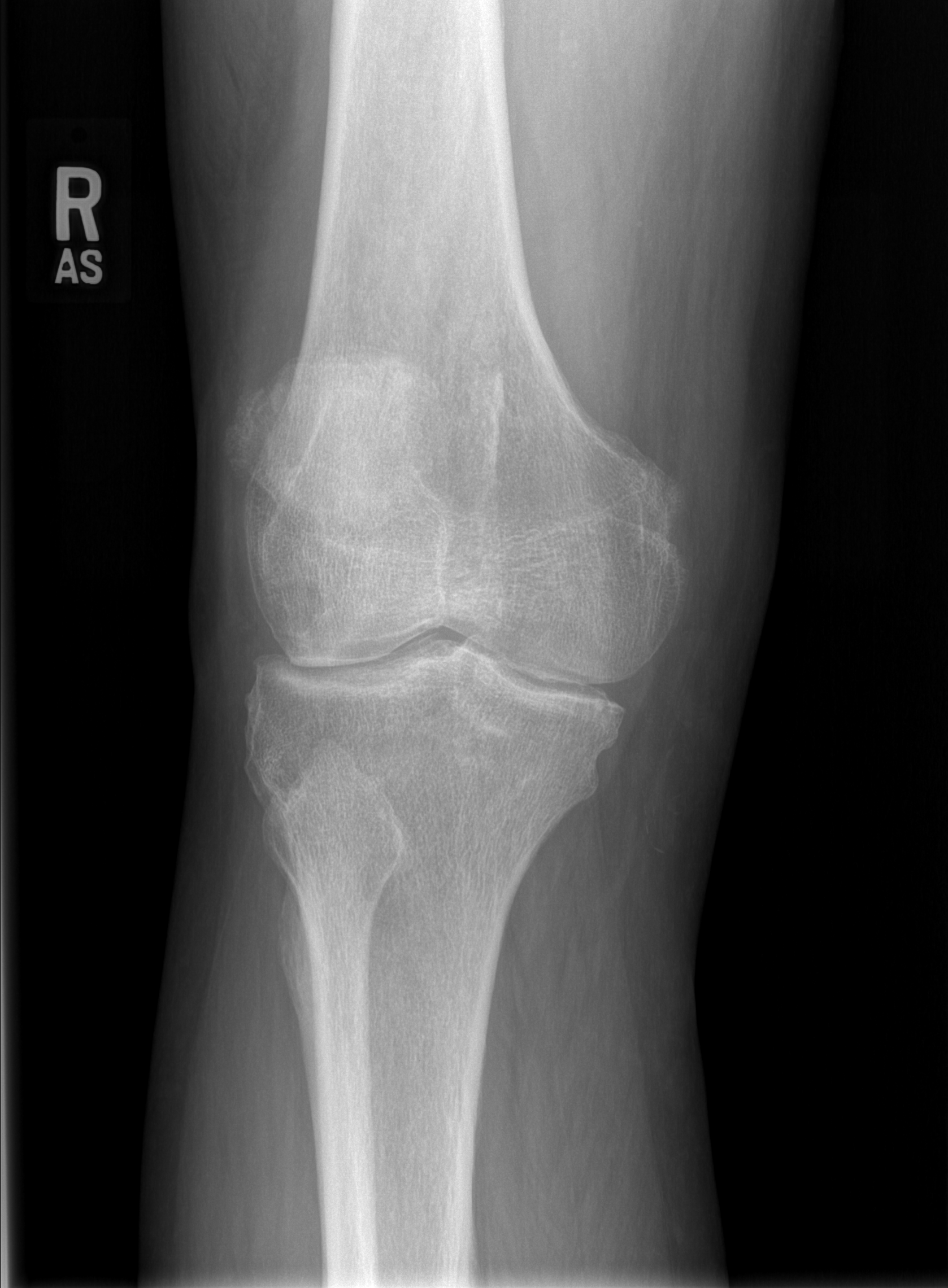

[t knee oblique right (1 of 2)]
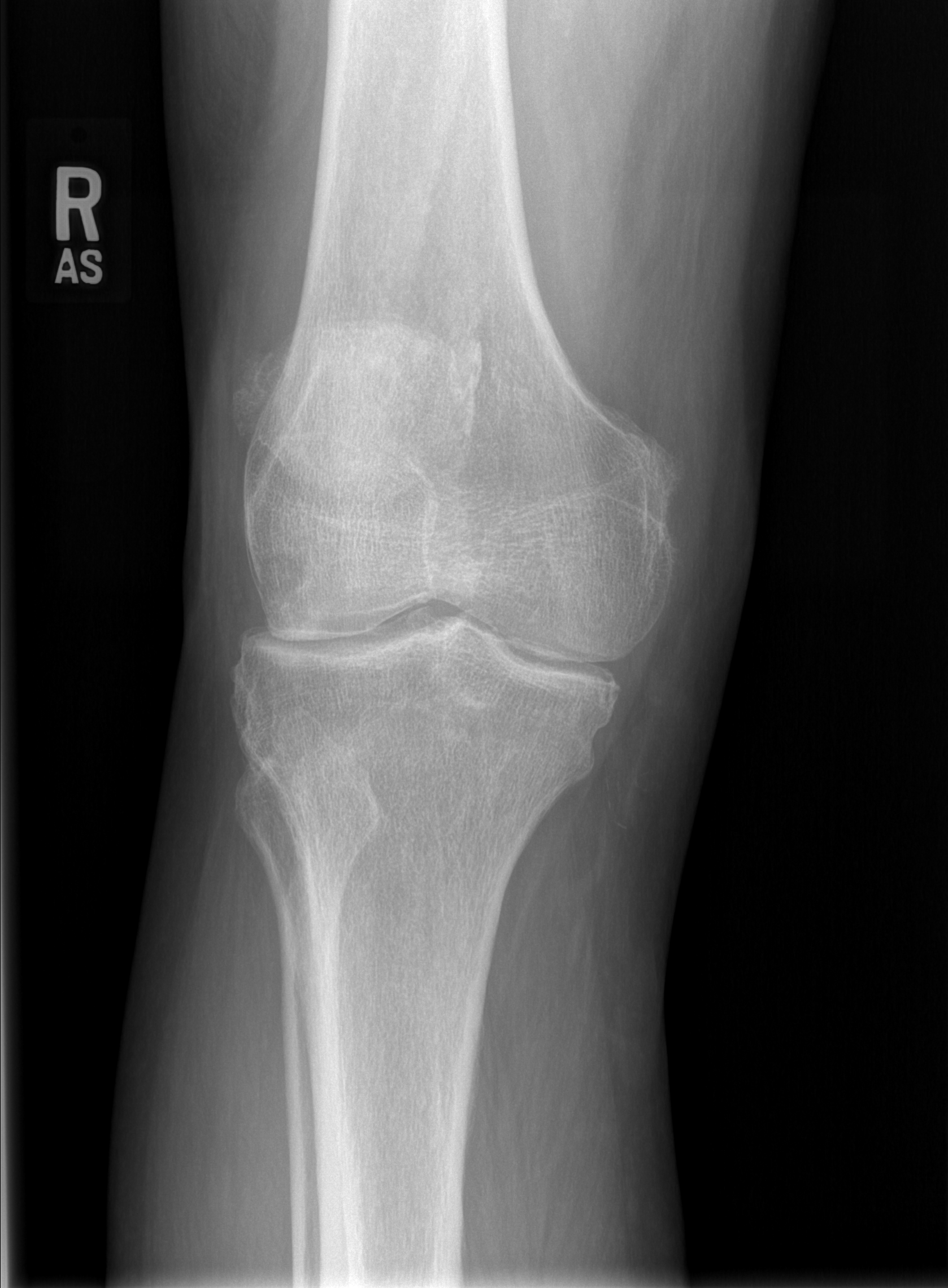

[t knee oblique right (2 of 2)]
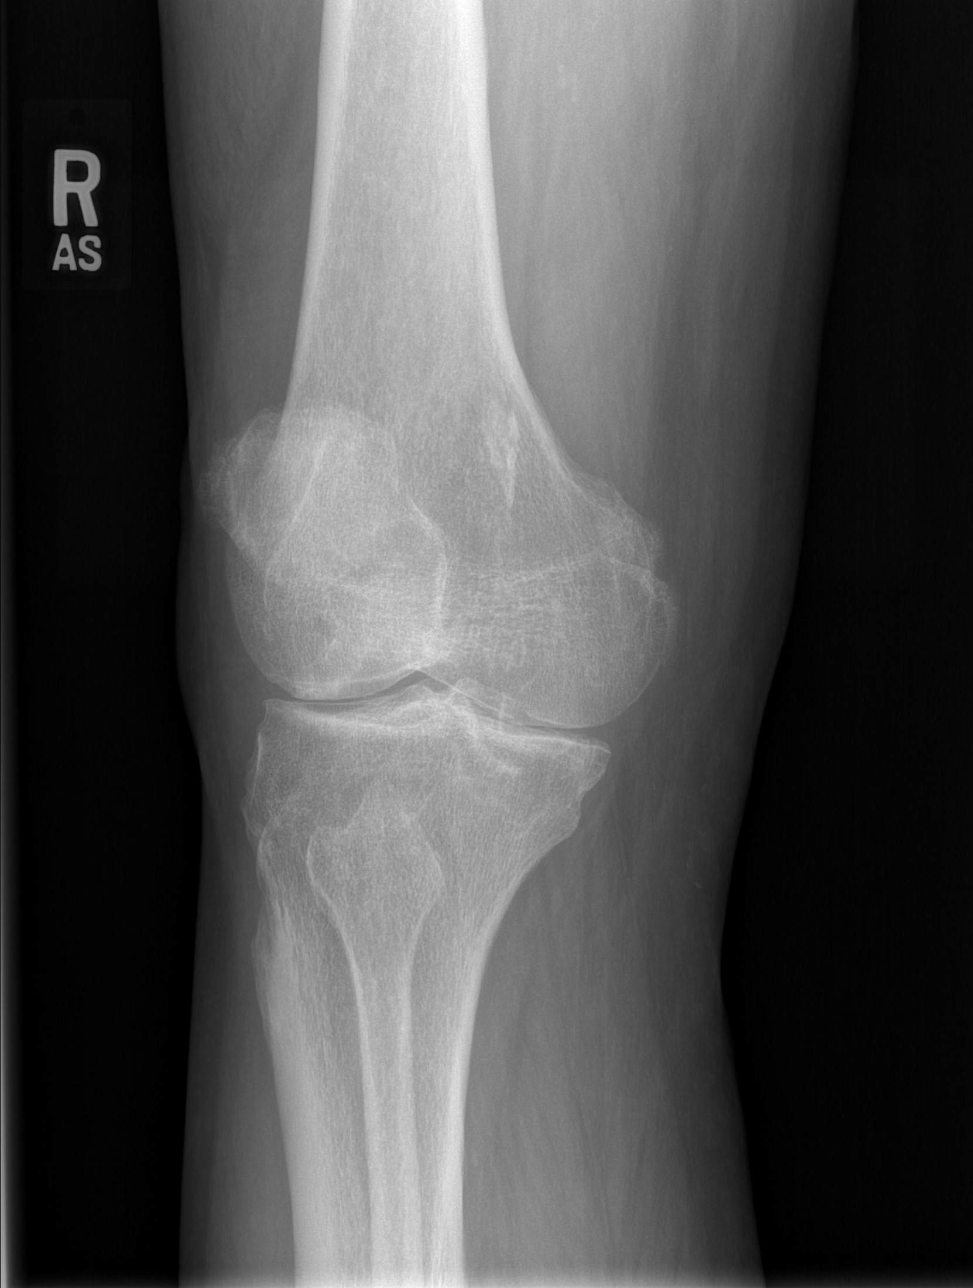

[t knee lat right]
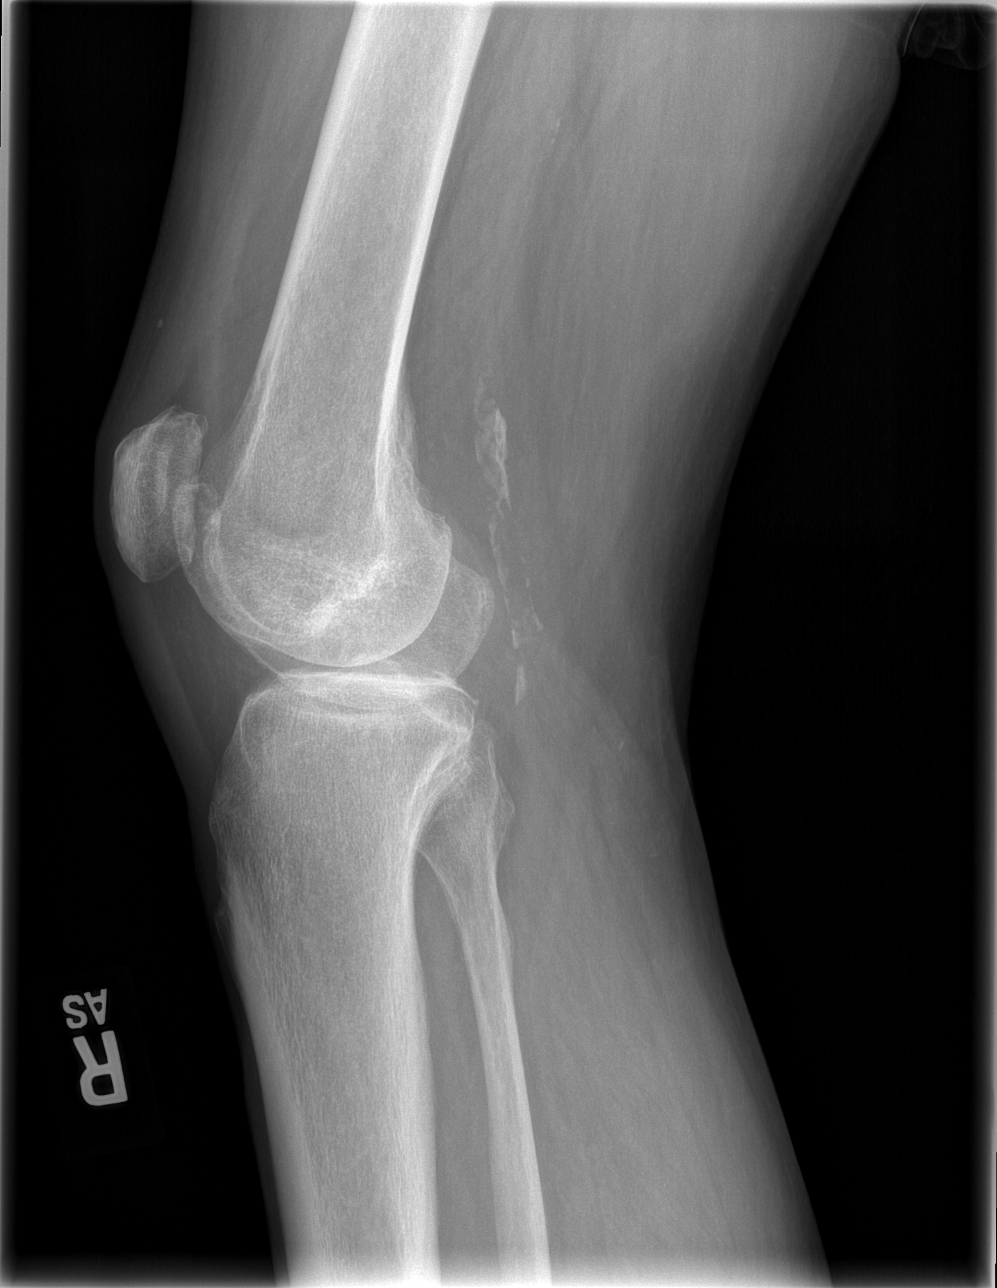

[4 of 4 positions shown; findings below may reference images not displayed]

FINDINGS: Small right knee joint effusion. Tricompartment degenerative change.
Subchondral cyst noted along the lateral femoral condyle, this is
most likely degenerative. No evidence fracture or dislocation.
Popliteal artery atherosclerotic vascular disease.
IMPRESSION: 1. Prominent tricompartment degenerative change. Small knee joint
effusion.

2. Peripheral vascular disease.

## 2017-07-07 ENCOUNTER — Ambulatory Visit: Payer: Medicaid Other | Admitting: Podiatry

## 2017-07-15 DIAGNOSIS — G894 Chronic pain syndrome: Secondary | ICD-10-CM | POA: Diagnosis not present

## 2017-07-15 DIAGNOSIS — E139 Other specified diabetes mellitus without complications: Secondary | ICD-10-CM | POA: Diagnosis not present

## 2017-07-15 DIAGNOSIS — E119 Type 2 diabetes mellitus without complications: Secondary | ICD-10-CM | POA: Diagnosis not present

## 2017-07-15 DIAGNOSIS — M069 Rheumatoid arthritis, unspecified: Secondary | ICD-10-CM | POA: Diagnosis not present

## 2017-07-15 DIAGNOSIS — M0549 Rheumatoid myopathy with rheumatoid arthritis of multiple sites: Secondary | ICD-10-CM | POA: Diagnosis not present

## 2017-07-15 DIAGNOSIS — R2689 Other abnormalities of gait and mobility: Secondary | ICD-10-CM | POA: Diagnosis not present

## 2017-07-15 DIAGNOSIS — G8929 Other chronic pain: Secondary | ICD-10-CM | POA: Diagnosis not present

## 2017-07-15 DIAGNOSIS — G4733 Obstructive sleep apnea (adult) (pediatric): Secondary | ICD-10-CM | POA: Diagnosis not present

## 2017-07-15 DIAGNOSIS — M79609 Pain in unspecified limb: Secondary | ICD-10-CM | POA: Diagnosis not present

## 2017-07-23 DIAGNOSIS — G4733 Obstructive sleep apnea (adult) (pediatric): Secondary | ICD-10-CM | POA: Diagnosis not present

## 2017-07-28 ENCOUNTER — Ambulatory Visit: Payer: Medicaid Other | Admitting: Podiatry

## 2017-07-30 ENCOUNTER — Encounter: Payer: Self-pay | Admitting: Internal Medicine

## 2017-07-31 ENCOUNTER — Telehealth: Payer: Self-pay | Admitting: Internal Medicine

## 2017-08-04 ENCOUNTER — Ambulatory Visit: Payer: Medicaid Other | Admitting: Podiatry

## 2017-08-15 ENCOUNTER — Telehealth: Payer: Self-pay | Admitting: *Deleted

## 2017-08-15 NOTE — Telephone Encounter (Signed)
Pt calls and states this am after eating a bowl of cereal- cheerios and 2% milk, states it was about a cup of cheerios. Checked cbg appr 2 hrs after eating and cbg was 174, pt states he feels dizzy, weak and clammy. meds taken this am was metformin, prednisone, lipitor, bupropion at 1115, checked sugar at 1310. He does not have transportation unless he calls 911, no openings in [place]. Please advise

## 2017-08-15 NOTE — Telephone Encounter (Signed)
Spoke w/ dr Dareen Piano.Called pt, reassured him, went over resident on call, ED, 911. Stay out of heat, drink plenty of fluids, if he continues to feel bad or feels worse to seek assistance, he is agreeable

## 2017-08-15 NOTE — Telephone Encounter (Signed)
Please ask him to call 911 if his symptoms worsen

## 2017-08-23 DIAGNOSIS — M79609 Pain in unspecified limb: Secondary | ICD-10-CM | POA: Diagnosis not present

## 2017-08-23 DIAGNOSIS — G894 Chronic pain syndrome: Secondary | ICD-10-CM | POA: Diagnosis not present

## 2017-08-23 DIAGNOSIS — G4733 Obstructive sleep apnea (adult) (pediatric): Secondary | ICD-10-CM | POA: Diagnosis not present

## 2017-08-23 DIAGNOSIS — M0549 Rheumatoid myopathy with rheumatoid arthritis of multiple sites: Secondary | ICD-10-CM | POA: Diagnosis not present

## 2017-08-23 DIAGNOSIS — E119 Type 2 diabetes mellitus without complications: Secondary | ICD-10-CM | POA: Diagnosis not present

## 2017-08-23 DIAGNOSIS — R2689 Other abnormalities of gait and mobility: Secondary | ICD-10-CM | POA: Diagnosis not present

## 2017-08-23 DIAGNOSIS — G8929 Other chronic pain: Secondary | ICD-10-CM | POA: Diagnosis not present

## 2017-08-23 DIAGNOSIS — M069 Rheumatoid arthritis, unspecified: Secondary | ICD-10-CM | POA: Diagnosis not present

## 2017-08-23 DIAGNOSIS — E139 Other specified diabetes mellitus without complications: Secondary | ICD-10-CM | POA: Diagnosis not present

## 2017-08-28 ENCOUNTER — Telehealth: Payer: Self-pay | Admitting: Internal Medicine

## 2017-08-28 ENCOUNTER — Other Ambulatory Visit: Payer: Self-pay | Admitting: Internal Medicine

## 2017-08-28 DIAGNOSIS — G894 Chronic pain syndrome: Secondary | ICD-10-CM

## 2017-08-28 NOTE — Telephone Encounter (Signed)
Patient is requesting refill on pain medicine, [facility] [address]

## 2017-08-29 MED ORDER — HYDROCODONE-ACETAMINOPHEN 10-325 MG PO TABS: 1.0000 | ORAL_TABLET | Freq: Four times a day (QID) | ORAL | 0 refills | Status: DC | PRN

## 2017-09-03 NOTE — Addendum Note (Signed)
Addended by: Hulan Fray on: 09/03/2017 02:15 PM   Modules accepted: Orders

## 2017-09-22 ENCOUNTER — Other Ambulatory Visit: Payer: Self-pay | Admitting: Internal Medicine

## 2017-09-22 DIAGNOSIS — G4733 Obstructive sleep apnea (adult) (pediatric): Secondary | ICD-10-CM | POA: Diagnosis not present

## 2017-09-22 DIAGNOSIS — M79609 Pain in unspecified limb: Secondary | ICD-10-CM | POA: Diagnosis not present

## 2017-09-22 DIAGNOSIS — G8929 Other chronic pain: Secondary | ICD-10-CM | POA: Diagnosis not present

## 2017-09-22 DIAGNOSIS — M069 Rheumatoid arthritis, unspecified: Secondary | ICD-10-CM | POA: Diagnosis not present

## 2017-09-22 DIAGNOSIS — R2689 Other abnormalities of gait and mobility: Secondary | ICD-10-CM | POA: Diagnosis not present

## 2017-09-22 DIAGNOSIS — E119 Type 2 diabetes mellitus without complications: Secondary | ICD-10-CM | POA: Diagnosis not present

## 2017-09-22 DIAGNOSIS — G894 Chronic pain syndrome: Secondary | ICD-10-CM | POA: Diagnosis not present

## 2017-09-22 DIAGNOSIS — E139 Other specified diabetes mellitus without complications: Secondary | ICD-10-CM | POA: Diagnosis not present

## 2017-09-22 DIAGNOSIS — M0549 Rheumatoid myopathy with rheumatoid arthritis of multiple sites: Secondary | ICD-10-CM | POA: Diagnosis not present

## 2017-09-22 NOTE — Telephone Encounter (Signed)
Next appt scheduled 8/15 with PCP.

## 2017-10-09 ENCOUNTER — Encounter: Payer: Medicaid Other | Admitting: Internal Medicine

## 2017-10-22 DIAGNOSIS — Z79899 Other long term (current) drug therapy: Secondary | ICD-10-CM | POA: Diagnosis not present

## 2017-10-22 DIAGNOSIS — M0589 Other rheumatoid arthritis with rheumatoid factor of multiple sites: Secondary | ICD-10-CM | POA: Diagnosis not present

## 2017-10-22 DIAGNOSIS — K760 Fatty (change of) liver, not elsewhere classified: Secondary | ICD-10-CM | POA: Diagnosis not present

## 2017-10-22 DIAGNOSIS — Z6838 Body mass index (BMI) 38.0-38.9, adult: Secondary | ICD-10-CM | POA: Diagnosis not present

## 2017-10-22 DIAGNOSIS — R42 Dizziness and giddiness: Secondary | ICD-10-CM | POA: Diagnosis not present

## 2017-10-22 DIAGNOSIS — M2550 Pain in unspecified joint: Secondary | ICD-10-CM | POA: Diagnosis not present

## 2017-10-22 DIAGNOSIS — E669 Obesity, unspecified: Secondary | ICD-10-CM | POA: Diagnosis not present

## 2017-10-22 DIAGNOSIS — M170 Bilateral primary osteoarthritis of knee: Secondary | ICD-10-CM | POA: Diagnosis not present

## 2017-10-23 DIAGNOSIS — M0549 Rheumatoid myopathy with rheumatoid arthritis of multiple sites: Secondary | ICD-10-CM | POA: Diagnosis not present

## 2017-10-23 DIAGNOSIS — M069 Rheumatoid arthritis, unspecified: Secondary | ICD-10-CM | POA: Diagnosis not present

## 2017-10-23 DIAGNOSIS — E119 Type 2 diabetes mellitus without complications: Secondary | ICD-10-CM | POA: Diagnosis not present

## 2017-10-23 DIAGNOSIS — G8929 Other chronic pain: Secondary | ICD-10-CM | POA: Diagnosis not present

## 2017-10-23 DIAGNOSIS — G4733 Obstructive sleep apnea (adult) (pediatric): Secondary | ICD-10-CM | POA: Diagnosis not present

## 2017-10-23 DIAGNOSIS — R2689 Other abnormalities of gait and mobility: Secondary | ICD-10-CM | POA: Diagnosis not present

## 2017-10-23 DIAGNOSIS — E139 Other specified diabetes mellitus without complications: Secondary | ICD-10-CM | POA: Diagnosis not present

## 2017-10-23 DIAGNOSIS — M79609 Pain in unspecified limb: Secondary | ICD-10-CM | POA: Diagnosis not present

## 2017-10-23 DIAGNOSIS — G894 Chronic pain syndrome: Secondary | ICD-10-CM | POA: Diagnosis not present

## 2017-10-24 ENCOUNTER — Telehealth: Payer: Self-pay | Admitting: *Deleted

## 2017-10-24 NOTE — Telephone Encounter (Signed)
Information was faxed to [employer] for PA for Hydrocodone-Acetaminophen 10/325. Awaiting decision.  Sander Nephew, RN 10/24/2017 11:38 AM.

## 2017-10-28 LAB — BASIC METABOLIC PANEL
BUN: 19 (ref 4–21)
CREATININE: 1 (ref 0.6–1.3)
Glucose: 105
Potassium: 4.6 (ref 3.4–5.3)
Sodium: 137 (ref 137–147)

## 2017-10-28 LAB — HEPATIC FUNCTION PANEL
ALK PHOS: 152 — AB (ref 25–125)
ALT: 43 — AB (ref 10–40)
AST: 39 (ref 14–40)
BILIRUBIN, TOTAL: 0.5

## 2017-10-28 LAB — CBC AND DIFFERENTIAL
HEMOGLOBIN: 15.5 (ref 13.5–17.5)
PLATELETS: 159 (ref 150–399)
WBC: 7.3

## 2017-11-06 ENCOUNTER — Encounter: Payer: Medicaid Other | Admitting: Internal Medicine

## 2017-11-17 ENCOUNTER — Other Ambulatory Visit: Payer: Self-pay

## 2017-11-17 DIAGNOSIS — G894 Chronic pain syndrome: Secondary | ICD-10-CM

## 2017-11-17 MED ORDER — HYDROCODONE-ACETAMINOPHEN 10-325 MG PO TABS: 1.0000 | ORAL_TABLET | Freq: Four times a day (QID) | ORAL | 0 refills | Status: DC | PRN

## 2017-11-17 NOTE — Telephone Encounter (Signed)
HYDROcodone-acetaminophen (NORCO) 10-325 MG tablet   [facility] @  [id number] [phone](Phone) [phone](Fax)

## 2017-11-17 NOTE — Telephone Encounter (Signed)
[place] database OK

## 2017-11-20 ENCOUNTER — Telehealth: Payer: Self-pay

## 2017-11-20 NOTE — Telephone Encounter (Signed)
Requesting PA on HYDROcodone-acetaminophen (NORCO) 10-325 MG tablet.

## 2017-11-23 DIAGNOSIS — M069 Rheumatoid arthritis, unspecified: Secondary | ICD-10-CM | POA: Diagnosis not present

## 2017-11-23 DIAGNOSIS — R2689 Other abnormalities of gait and mobility: Secondary | ICD-10-CM | POA: Diagnosis not present

## 2017-11-23 DIAGNOSIS — E139 Other specified diabetes mellitus without complications: Secondary | ICD-10-CM | POA: Diagnosis not present

## 2017-11-23 DIAGNOSIS — G894 Chronic pain syndrome: Secondary | ICD-10-CM | POA: Diagnosis not present

## 2017-11-23 DIAGNOSIS — M0549 Rheumatoid myopathy with rheumatoid arthritis of multiple sites: Secondary | ICD-10-CM | POA: Diagnosis not present

## 2017-11-23 DIAGNOSIS — G4733 Obstructive sleep apnea (adult) (pediatric): Secondary | ICD-10-CM | POA: Diagnosis not present

## 2017-11-23 DIAGNOSIS — G8929 Other chronic pain: Secondary | ICD-10-CM | POA: Diagnosis not present

## 2017-11-23 DIAGNOSIS — M79609 Pain in unspecified limb: Secondary | ICD-10-CM | POA: Diagnosis not present

## 2017-11-23 DIAGNOSIS — E119 Type 2 diabetes mellitus without complications: Secondary | ICD-10-CM | POA: Diagnosis not present

## 2017-11-25 ENCOUNTER — Telehealth: Payer: Self-pay | Admitting: *Deleted

## 2017-11-25 ENCOUNTER — Other Ambulatory Visit: Payer: Self-pay | Admitting: Internal Medicine

## 2017-11-25 NOTE — Telephone Encounter (Signed)
Needs refill on HYDROcodone-acetaminophen (NORCO) 10-325 MG tablet @ [facility] ; Pt contact [phone]

## 2017-11-25 NOTE — Telephone Encounter (Signed)
addressed

## 2017-11-25 NOTE — Telephone Encounter (Signed)
Pt calls and is upset about PA for pain med, spoke w/ kaye. Called pt explained process of medicaid and narcotic PA's, he is reassured

## 2017-11-26 NOTE — Telephone Encounter (Signed)
PCP  completed the appropriate form on 11/21/17-will have clinic staff check on the status of PA.Despina Hidden Cassady9/4/201912:58 PM

## 2017-11-26 NOTE — Telephone Encounter (Signed)
Information was faxed to [place] for PA for Hydrocodone Acetaminophen 10/325.  Awaiting determination from [facility].  11/25/2017 10:30 AM Call to [facility] Tracks PA was approved for the Hydrocodone Acetaminophen  11/26/2017 thru 05/25/2018. [place] [id number]. [id number].  Sander Nephew, RN 11/27/2018 10:45 AM.

## 2017-12-04 ENCOUNTER — Telehealth: Payer: Self-pay | Admitting: Internal Medicine

## 2017-12-04 NOTE — Telephone Encounter (Signed)
Returned call to patient. No answer. Message left on VM requesting return call if needed. Hubbard Hartshorn, RN, BSN

## 2017-12-04 NOTE — Telephone Encounter (Signed)
Pt would like to talk to a nurse regarding about medicine, pt contact # [phone]

## 2017-12-18 ENCOUNTER — Telehealth: Payer: Self-pay

## 2017-12-18 NOTE — Telephone Encounter (Signed)
thanks

## 2017-12-18 NOTE — Telephone Encounter (Signed)
HYDROcodone-acetaminophen (NORCO) 10-325 MG tablet, refill [facility]  [id number] [phone](Phone) [phone](Fax)

## 2017-12-18 NOTE — Telephone Encounter (Signed)
Pt has script on hold at pharm, cannot be put thru til 10/1, so pharm cannot tell clinic if a PA will be needed until then, will send to [place] and gladys to see if we can call ahead of time for possible PA r/t new regulations by medicaid so pt will not be inconvenienced.

## 2017-12-23 DIAGNOSIS — R2689 Other abnormalities of gait and mobility: Secondary | ICD-10-CM | POA: Diagnosis not present

## 2017-12-23 DIAGNOSIS — G4733 Obstructive sleep apnea (adult) (pediatric): Secondary | ICD-10-CM | POA: Diagnosis not present

## 2017-12-23 DIAGNOSIS — M069 Rheumatoid arthritis, unspecified: Secondary | ICD-10-CM | POA: Diagnosis not present

## 2017-12-23 DIAGNOSIS — E139 Other specified diabetes mellitus without complications: Secondary | ICD-10-CM | POA: Diagnosis not present

## 2017-12-23 DIAGNOSIS — G894 Chronic pain syndrome: Secondary | ICD-10-CM | POA: Diagnosis not present

## 2017-12-23 DIAGNOSIS — G8929 Other chronic pain: Secondary | ICD-10-CM | POA: Diagnosis not present

## 2017-12-23 DIAGNOSIS — E119 Type 2 diabetes mellitus without complications: Secondary | ICD-10-CM | POA: Diagnosis not present

## 2017-12-23 DIAGNOSIS — M79609 Pain in unspecified limb: Secondary | ICD-10-CM | POA: Diagnosis not present

## 2017-12-23 DIAGNOSIS — M0549 Rheumatoid myopathy with rheumatoid arthritis of multiple sites: Secondary | ICD-10-CM | POA: Diagnosis not present

## 2017-12-25 ENCOUNTER — Ambulatory Visit: Payer: Medicaid Other | Admitting: Internal Medicine

## 2017-12-25 ENCOUNTER — Encounter: Payer: Self-pay | Admitting: Internal Medicine

## 2017-12-25 ENCOUNTER — Other Ambulatory Visit: Payer: Self-pay

## 2017-12-25 VITALS — BP 116/71 | HR 90 | Temp 98.0°F | Ht 71.0 in | Wt 297.9 lb

## 2017-12-25 DIAGNOSIS — E1169 Type 2 diabetes mellitus with other specified complication: Secondary | ICD-10-CM | POA: Diagnosis not present

## 2017-12-25 DIAGNOSIS — L409 Psoriasis, unspecified: Secondary | ICD-10-CM

## 2017-12-25 DIAGNOSIS — J410 Simple chronic bronchitis: Secondary | ICD-10-CM

## 2017-12-25 DIAGNOSIS — G479 Sleep disorder, unspecified: Secondary | ICD-10-CM

## 2017-12-25 DIAGNOSIS — J449 Chronic obstructive pulmonary disease, unspecified: Secondary | ICD-10-CM

## 2017-12-25 DIAGNOSIS — Z79891 Long term (current) use of opiate analgesic: Secondary | ICD-10-CM

## 2017-12-25 DIAGNOSIS — M069 Rheumatoid arthritis, unspecified: Secondary | ICD-10-CM | POA: Diagnosis not present

## 2017-12-25 DIAGNOSIS — M549 Dorsalgia, unspecified: Secondary | ICD-10-CM

## 2017-12-25 DIAGNOSIS — I10 Essential (primary) hypertension: Secondary | ICD-10-CM | POA: Diagnosis not present

## 2017-12-25 DIAGNOSIS — R269 Unspecified abnormalities of gait and mobility: Secondary | ICD-10-CM | POA: Diagnosis not present

## 2017-12-25 DIAGNOSIS — E119 Type 2 diabetes mellitus without complications: Secondary | ICD-10-CM

## 2017-12-25 DIAGNOSIS — Z23 Encounter for immunization: Secondary | ICD-10-CM | POA: Diagnosis not present

## 2017-12-25 DIAGNOSIS — Z0000 Encounter for general adult medical examination without abnormal findings: Secondary | ICD-10-CM

## 2017-12-25 DIAGNOSIS — Z7984 Long term (current) use of oral hypoglycemic drugs: Secondary | ICD-10-CM

## 2017-12-25 DIAGNOSIS — F17211 Nicotine dependence, cigarettes, in remission: Secondary | ICD-10-CM

## 2017-12-25 DIAGNOSIS — M05719 Rheumatoid arthritis with rheumatoid factor of unspecified shoulder without organ or systems involvement: Secondary | ICD-10-CM

## 2017-12-25 DIAGNOSIS — Z79899 Other long term (current) drug therapy: Secondary | ICD-10-CM

## 2017-12-25 DIAGNOSIS — F17200 Nicotine dependence, unspecified, uncomplicated: Secondary | ICD-10-CM

## 2017-12-25 LAB — POCT GLYCOSYLATED HEMOGLOBIN (HGB A1C): Hemoglobin A1C: 6.2 % — AB (ref 4.0–5.6)

## 2017-12-25 LAB — GLUCOSE, CAPILLARY: GLUCOSE-CAPILLARY: 156 mg/dL — AB (ref 70–99)

## 2017-12-25 MED ORDER — FLUOCINONIDE-E 0.05 % EX CREA: 1.0000 "application " | TOPICAL_CREAM | Freq: Two times a day (BID) | CUTANEOUS | 1 refills | Status: DC

## 2017-12-25 MED ORDER — ACCU-CHEK SOFTCLIX LANCETS MISC: 7 refills | Status: DC

## 2017-12-25 MED ORDER — GLUCOSE BLOOD VI STRP: ORAL_STRIP | 7 refills | Status: DC

## 2017-12-25 NOTE — Assessment & Plan Note (Addendum)
He got a flu shot today.  He also accepted stool cards screening.  We discussed that a grab stick is not covered by insurance and he would have to be self-pay.  He needs his wheelchair repaired including the control as it is not going in a straight line and his wheels are worn and he needs a new battery.

## 2017-12-25 NOTE — Assessment & Plan Note (Addendum)
This problem is chronic and stable.  His diagnosis of COPD is a clinical diagnosis only as he has never had PFTs.  He has been able to quit smoking!!!!  His exam is actually better today than it ever has been with less wheezing.  He has not been anxious to get PFTs in the past but I will continue to offer these periodically.  PLAN: Follow and offer PFTs

## 2017-12-25 NOTE — Assessment & Plan Note (Signed)
This problem is chronic and improved.  He had success quitting smoking with bupropion.  He quit on day 13 and although he still has cravings, he has not had any additional cigarettes.  His sense of smell has returned and he knows how disgusting cigarette smoke smells because his cousin smokes a lot.  He has unfortunately regained 34 pounds since April but this is likely multifactorial and not just from smoking cessation.  He has realized that the hand motion no longer required for smoking is causing him issues.  He is eating some low-salt pretzels but we discussed that this has carbohydrates and could be contributing to his elevated A1c.  We discussed maybe using sugar-free lollipops or other sort of low calorie low-carb treats to try to help him get through this transition.  We discussed how amazing that he was able to quit smoking.  PLAN : Continue bupropion Congratulations were given today

## 2017-12-25 NOTE — Progress Notes (Signed)
   Subjective:    Patient ID: Andrew Fowler, male    DOB: 1960/10/08, 57 y.o.   MRN: [id number]  HPI  Andrew Fowler is here for tobacco use F/U. Please see the A&P for the status of the pt's chronic medical problems.  ROS : per ROS section and in problem oriented charting. All other systems are negative.  PMHx, Soc hx, and / or Fam hx : He and his brother live in a house rented by his cousin and cousin's spouses.  The landlord is thinking of selling the house.  The cousin smokes quite a bit.  The cousin is remodeling the kitchen which means Andrew Fowler does not have easy access to cooking facilities.  Review of Systems  HENT: Negative for rhinorrhea and sneezing.        Dry throat in the afternoon  Eyes: Negative for itching.  Respiratory: Positive for wheezing.   Gastrointestinal:       No GERD symptoms  Musculoskeletal: Positive for arthralgias, back pain and gait problem.  Psychiatric/Behavioral: Positive for sleep disturbance.       Objective:   Physical Exam  Constitutional: He appears well-developed and well-nourished. No distress.  HENT:  Head: Normocephalic and atraumatic.  Right Ear: External ear normal.  Left Ear: External ear normal.  Nose: Nose normal.  Eyes: Conjunctivae and EOM are normal. Right eye exhibits no discharge. Left eye exhibits no discharge. No scleral icterus.  Cardiovascular: Normal rate, regular rhythm and normal heart sounds.  No murmur heard. Pulmonary/Chest: Effort normal and breath sounds normal. No respiratory distress.  Only occasional end expiratory wheeze or coarse breath sounds.  Overall much less wheezing than previous examinations  Neurological: He is alert.  Skin: Skin is warm and dry. He is not diaphoretic. No erythema. No pallor.      Assessment & Plan:

## 2017-12-25 NOTE — Assessment & Plan Note (Signed)
This problem is chronic and stable.  He will be due a hydrocodone refill at the end of November.  We did not get into details about his pain regimen today.

## 2017-12-25 NOTE — Assessment & Plan Note (Signed)
This problem is chronic and stable and is managed by his rheumatologist.  I am requesting that his most recent blood work results be faxed to our office.

## 2017-12-25 NOTE — Patient Instructions (Addendum)
1. I am sending stool cards for colon cancer screening 2. You will get the flu shot 3. Great job on quitting smoking!! 4. See me in 3 months 5. I will look into grab stick and DM shoes

## 2017-12-25 NOTE — Assessment & Plan Note (Signed)
This problem is chronic and stable.  He was diagnosed with psoriasis by clinical exam and skin biopsy.  He requests a refill of his steroid cream.  PLAN:  Cont current meds

## 2017-12-25 NOTE — Assessment & Plan Note (Addendum)
This problem is chronic and stable.  His A1c trend has been 5.4 -6.2 today.  He is on metformin but this is mostly for weight benefits.  His A1c is up today because he has gained 34 pounds since April of this year.  This is likely multifactorial including smoking cessation and not having access to cooking facilities which means he has had to order food out more recently.  He requests diabetic shoes today and after speaking to [place], we are referring him to the only place known to do diabetic shoes which is located in [facility].  PLAN:  Cont current meds Follow A1c trend due to recent dietary changes and weight gain

## 2017-12-25 NOTE — Assessment & Plan Note (Signed)
This problem is chronic and well controlled.  He is on lisinopril 10 mg daily.  He is describing a dry throat in the afternoons.  It can result in a little bit of a dry cough.  We discussed that a cough can be a side effect of an ACE inhibitor and that if this continues to be an issue but he will need to let me know.  His rheumatologist checks his blood work and we are asking that results be faxed over.  PLAN:  Cont current meds

## 2017-12-31 ENCOUNTER — Telehealth: Payer: Self-pay | Admitting: Dietician

## 2017-12-31 ENCOUNTER — Encounter: Payer: Self-pay | Admitting: Internal Medicine

## 2017-12-31 NOTE — Telephone Encounter (Addendum)
Called Andrew Fowler to congratulate him on quitting smoking. He is happy for his 5 months being quit, but reports it cost him some weight regain. He says he feels that he is getting back to snacking on more fruits and vegetables rather than chips etc when he has an urge for a cigarette.  We discussed options for healthy things to do until his urge to smoke passes.  Plan: follow up call in ~ 1 month for support.  Debera Lat, RD 12/31/2017 3:30 PM.

## 2018-01-01 NOTE — Telephone Encounter (Signed)
Thank you :)

## 2018-01-19 ENCOUNTER — Other Ambulatory Visit: Payer: Self-pay | Admitting: Internal Medicine

## 2018-01-19 DIAGNOSIS — G894 Chronic pain syndrome: Secondary | ICD-10-CM

## 2018-01-19 NOTE — Telephone Encounter (Signed)
Refill Request   HYDROcodone-acetaminophen (NORCO) 10-325 MG tablet  [id number] [place] [place] [address]

## 2018-01-20 NOTE — Telephone Encounter (Signed)
I Rx 3 months worth end of aug. Should have another script to last until end of Nov

## 2018-01-20 NOTE — Telephone Encounter (Signed)
Will check on PA, sending to [facility].

## 2018-01-21 ENCOUNTER — Telehealth: Payer: Self-pay | Admitting: *Deleted

## 2018-01-21 NOTE — Telephone Encounter (Addendum)
Information was faxed to [place] for PA for Hydrocodone-Acetaminophen 10/345m .  [facility]  GSander Nephew RN 01/21/2018 2:15 PM. [place]et[place] approved 01/22/2018 thru 07/21/2018.  [id number] GSander Nephew RN 01/23/2018 5:00 PM.

## 2018-01-22 ENCOUNTER — Other Ambulatory Visit: Payer: Self-pay | Admitting: Internal Medicine

## 2018-01-22 DIAGNOSIS — G894 Chronic pain syndrome: Secondary | ICD-10-CM

## 2018-01-22 NOTE — Telephone Encounter (Signed)
Pt calling back checking on pain medicine, pls contact pt [phone]

## 2018-01-22 NOTE — Telephone Encounter (Signed)
Needs refill on HYDROcodone-acetaminophen (NORCO) 10-325 MG tablet [facility] [address] ; pt contact [phone]

## 2018-01-23 DIAGNOSIS — M069 Rheumatoid arthritis, unspecified: Secondary | ICD-10-CM | POA: Diagnosis not present

## 2018-01-23 DIAGNOSIS — G894 Chronic pain syndrome: Secondary | ICD-10-CM | POA: Diagnosis not present

## 2018-01-23 DIAGNOSIS — E119 Type 2 diabetes mellitus without complications: Secondary | ICD-10-CM | POA: Diagnosis not present

## 2018-01-23 DIAGNOSIS — M0549 Rheumatoid myopathy with rheumatoid arthritis of multiple sites: Secondary | ICD-10-CM | POA: Diagnosis not present

## 2018-01-23 DIAGNOSIS — R2689 Other abnormalities of gait and mobility: Secondary | ICD-10-CM | POA: Diagnosis not present

## 2018-01-23 DIAGNOSIS — M79609 Pain in unspecified limb: Secondary | ICD-10-CM | POA: Diagnosis not present

## 2018-01-23 DIAGNOSIS — G4733 Obstructive sleep apnea (adult) (pediatric): Secondary | ICD-10-CM | POA: Diagnosis not present

## 2018-01-23 DIAGNOSIS — E139 Other specified diabetes mellitus without complications: Secondary | ICD-10-CM | POA: Diagnosis not present

## 2018-01-23 DIAGNOSIS — G8929 Other chronic pain: Secondary | ICD-10-CM | POA: Diagnosis not present

## 2018-01-23 NOTE — Telephone Encounter (Signed)
Informed pt, PA was approved per gladysh.

## 2018-01-23 NOTE — Telephone Encounter (Signed)
Pls contact patient regarding pain medicine, [phone]

## 2018-01-23 NOTE — Telephone Encounter (Signed)
Pt called asking about pain med, informed him gladys is working on the PA, hes good

## 2018-01-23 NOTE — Telephone Encounter (Signed)
Last rx written 08/26 x 3 RXs. Last OV 12/25/17. UDS  10/31/16.

## 2018-01-26 NOTE — Telephone Encounter (Signed)
Pt states   HYDROcodone-acetaminophen (NORCO) 10-325 MG tablet   Is not at the pharmacy. Please call pt back.

## 2018-01-26 NOTE — Telephone Encounter (Addendum)
[facility], states they are filling RX for patient today.  Returned call to patient:  Left message on self identified voice mail that pharmacy is filling his RX today.  Imogene, RN

## 2018-01-26 NOTE — Telephone Encounter (Signed)
He should have enough refills until end of month right?

## 2018-02-16 ENCOUNTER — Encounter: Payer: Self-pay | Admitting: *Deleted

## 2018-02-18 ENCOUNTER — Other Ambulatory Visit: Payer: Self-pay

## 2018-02-18 DIAGNOSIS — G894 Chronic pain syndrome: Secondary | ICD-10-CM

## 2018-02-18 MED ORDER — HYDROCODONE-ACETAMINOPHEN 10-325 MG PO TABS: 1.0000 | ORAL_TABLET | Freq: Four times a day (QID) | ORAL | 0 refills | Status: DC | PRN

## 2018-02-18 NOTE — Telephone Encounter (Signed)
Original RX for Norco written on 11/17/17 for 3 RX's total. Pt filled RX's on 9/4, 10/1, and 11/4 per Pharmacist. Last OV 12/25/17 No future OV's Last Urine drug screen on 10/31/17 w/ unexpected results. Thanks! SChaplin, RN,BSN

## 2018-02-18 NOTE — Telephone Encounter (Signed)
HYDROcodone-acetaminophen (NORCO) 10-325 MG tablet, refill request @  [id number] [phone](Phone) [phone](Fax

## 2018-02-18 NOTE — Telephone Encounter (Signed)
Done

## 2018-02-19 ENCOUNTER — Other Ambulatory Visit: Payer: Self-pay | Admitting: Internal Medicine

## 2018-02-22 DIAGNOSIS — G4733 Obstructive sleep apnea (adult) (pediatric): Secondary | ICD-10-CM | POA: Diagnosis not present

## 2018-02-22 DIAGNOSIS — M0549 Rheumatoid myopathy with rheumatoid arthritis of multiple sites: Secondary | ICD-10-CM | POA: Diagnosis not present

## 2018-02-22 DIAGNOSIS — E119 Type 2 diabetes mellitus without complications: Secondary | ICD-10-CM | POA: Diagnosis not present

## 2018-02-22 DIAGNOSIS — R2689 Other abnormalities of gait and mobility: Secondary | ICD-10-CM | POA: Diagnosis not present

## 2018-02-22 DIAGNOSIS — E139 Other specified diabetes mellitus without complications: Secondary | ICD-10-CM | POA: Diagnosis not present

## 2018-02-22 DIAGNOSIS — M79609 Pain in unspecified limb: Secondary | ICD-10-CM | POA: Diagnosis not present

## 2018-02-22 DIAGNOSIS — M069 Rheumatoid arthritis, unspecified: Secondary | ICD-10-CM | POA: Diagnosis not present

## 2018-02-22 DIAGNOSIS — G8929 Other chronic pain: Secondary | ICD-10-CM | POA: Diagnosis not present

## 2018-02-22 DIAGNOSIS — G894 Chronic pain syndrome: Secondary | ICD-10-CM | POA: Diagnosis not present

## 2018-02-23 ENCOUNTER — Other Ambulatory Visit: Payer: Self-pay | Admitting: Internal Medicine

## 2018-02-26 NOTE — Telephone Encounter (Signed)
Called to provide support for healthy habits/ curbing weight gain after quitting smoking. Andrew Fowler reports 7 months tobacco free. He has "back slipped" on somethings, but is trying to get back to what he knows helped him with weight loss before. He denied need for further assistance at this time. Follow up as needed.  Debera Lat, RD 02/26/2018 2:59 PM.

## 2018-03-13 ENCOUNTER — Other Ambulatory Visit: Payer: Self-pay | Admitting: Internal Medicine

## 2018-03-13 NOTE — Telephone Encounter (Signed)
Reviewed chart, longstanding Rx, reviewed PMP database appropriate. Will provide 1 month refill

## 2018-03-19 ENCOUNTER — Other Ambulatory Visit: Payer: Self-pay | Admitting: Internal Medicine

## 2018-03-20 ENCOUNTER — Other Ambulatory Visit: Payer: Self-pay

## 2018-03-20 NOTE — Telephone Encounter (Signed)
This was done today

## 2018-03-20 NOTE — Telephone Encounter (Signed)
metFORMIN (GLUCOPHAGE) 1000 MG tablet, Ref[facility] @  [phone](Phone) [phone](Fax)

## 2018-03-25 DIAGNOSIS — M069 Rheumatoid arthritis, unspecified: Secondary | ICD-10-CM | POA: Diagnosis not present

## 2018-03-25 DIAGNOSIS — M79609 Pain in unspecified limb: Secondary | ICD-10-CM | POA: Diagnosis not present

## 2018-03-25 DIAGNOSIS — E139 Other specified diabetes mellitus without complications: Secondary | ICD-10-CM | POA: Diagnosis not present

## 2018-03-25 DIAGNOSIS — G8929 Other chronic pain: Secondary | ICD-10-CM | POA: Diagnosis not present

## 2018-03-25 DIAGNOSIS — G894 Chronic pain syndrome: Secondary | ICD-10-CM | POA: Diagnosis not present

## 2018-03-25 DIAGNOSIS — R2689 Other abnormalities of gait and mobility: Secondary | ICD-10-CM | POA: Diagnosis not present

## 2018-03-25 DIAGNOSIS — G4733 Obstructive sleep apnea (adult) (pediatric): Secondary | ICD-10-CM | POA: Diagnosis not present

## 2018-03-25 DIAGNOSIS — M0549 Rheumatoid myopathy with rheumatoid arthritis of multiple sites: Secondary | ICD-10-CM | POA: Diagnosis not present

## 2018-03-25 DIAGNOSIS — E119 Type 2 diabetes mellitus without complications: Secondary | ICD-10-CM | POA: Diagnosis not present

## 2018-03-26 DIAGNOSIS — Z6839 Body mass index (BMI) 39.0-39.9, adult: Secondary | ICD-10-CM | POA: Diagnosis not present

## 2018-03-26 DIAGNOSIS — Z79899 Other long term (current) drug therapy: Secondary | ICD-10-CM | POA: Diagnosis not present

## 2018-03-26 DIAGNOSIS — M170 Bilateral primary osteoarthritis of knee: Secondary | ICD-10-CM | POA: Diagnosis not present

## 2018-03-26 DIAGNOSIS — M2550 Pain in unspecified joint: Secondary | ICD-10-CM | POA: Diagnosis not present

## 2018-03-26 DIAGNOSIS — M0589 Other rheumatoid arthritis with rheumatoid factor of multiple sites: Secondary | ICD-10-CM | POA: Diagnosis not present

## 2018-03-26 DIAGNOSIS — E669 Obesity, unspecified: Secondary | ICD-10-CM | POA: Diagnosis not present

## 2018-03-26 DIAGNOSIS — K760 Fatty (change of) liver, not elsewhere classified: Secondary | ICD-10-CM | POA: Diagnosis not present

## 2018-03-26 DIAGNOSIS — M5030 Other cervical disc degeneration, unspecified cervical region: Secondary | ICD-10-CM | POA: Diagnosis not present

## 2018-04-02 ENCOUNTER — Ambulatory Visit: Payer: Medicaid Other | Admitting: Internal Medicine

## 2018-04-02 ENCOUNTER — Other Ambulatory Visit: Payer: Self-pay

## 2018-04-02 ENCOUNTER — Encounter: Payer: Self-pay | Admitting: Internal Medicine

## 2018-04-02 DIAGNOSIS — Z79899 Other long term (current) drug therapy: Secondary | ICD-10-CM | POA: Diagnosis not present

## 2018-04-02 DIAGNOSIS — L409 Psoriasis, unspecified: Secondary | ICD-10-CM

## 2018-04-02 DIAGNOSIS — H938X3 Other specified disorders of ear, bilateral: Secondary | ICD-10-CM

## 2018-04-02 DIAGNOSIS — J410 Simple chronic bronchitis: Secondary | ICD-10-CM

## 2018-04-02 DIAGNOSIS — J449 Chronic obstructive pulmonary disease, unspecified: Secondary | ICD-10-CM

## 2018-04-02 DIAGNOSIS — M069 Rheumatoid arthritis, unspecified: Secondary | ICD-10-CM

## 2018-04-02 DIAGNOSIS — E1169 Type 2 diabetes mellitus with other specified complication: Secondary | ICD-10-CM

## 2018-04-02 DIAGNOSIS — F17200 Nicotine dependence, unspecified, uncomplicated: Secondary | ICD-10-CM

## 2018-04-02 DIAGNOSIS — Z7952 Long term (current) use of systemic steroids: Secondary | ICD-10-CM

## 2018-04-02 DIAGNOSIS — F17210 Nicotine dependence, cigarettes, uncomplicated: Secondary | ICD-10-CM | POA: Diagnosis not present

## 2018-04-02 DIAGNOSIS — I10 Essential (primary) hypertension: Secondary | ICD-10-CM

## 2018-04-02 DIAGNOSIS — F418 Other specified anxiety disorders: Secondary | ICD-10-CM

## 2018-04-02 DIAGNOSIS — Z7951 Long term (current) use of inhaled steroids: Secondary | ICD-10-CM

## 2018-04-02 DIAGNOSIS — Z0000 Encounter for general adult medical examination without abnormal findings: Secondary | ICD-10-CM

## 2018-04-02 DIAGNOSIS — M05719 Rheumatoid arthritis with rheumatoid factor of unspecified shoulder without organ or systems involvement: Secondary | ICD-10-CM

## 2018-04-02 MED ORDER — ALBUTEROL SULFATE HFA 108 (90 BASE) MCG/ACT IN AERS: 1.0000 | INHALATION_SPRAY | RESPIRATORY_TRACT | 5 refills | Status: DC | PRN

## 2018-04-02 MED ORDER — BUPROPION HCL ER (SR) 150 MG PO TB12: ORAL_TABLET | ORAL | 1 refills | Status: DC

## 2018-04-02 NOTE — Patient Instructions (Signed)
1. I will refill lXanax on the 20th.  2. See me in 3 months 3. Let me know if ears do not get better

## 2018-04-02 NOTE — Assessment & Plan Note (Signed)
This problem is chronic and controlled.  He is due for a refill of his Xanax later this month and I am sending myself a message to fill it on the 20th.  He normally gets 40 pills of the 0.5 mg but his refill in December was from 0.25 because apparently the pharmacy was out of his 0.5 pills.  He prefers a 0.5 pill.  PLAN : refill his Xanax later this month

## 2018-04-02 NOTE — Assessment & Plan Note (Signed)
This problem is chronic and stable.  He is on lisinopril 10 mg once a day and does not have a cough.  His blood pressure is at goal.  PLAN:  Cont current meds   BP Readings from Last 3 Encounters:  04/02/18 126/81  12/25/17 116/71  06/26/17 131/80

## 2018-04-02 NOTE — Assessment & Plan Note (Signed)
He has not gotten his wheelchair repaired and we are looking into that. He did not know how to complete the stool cards since they were different than how he completed them in the past so we gave him instruction.  He has an eye exam already scheduled  This is a NEW PROBLEM : he complains of his ears itching bilaterally.  This includes the pinna and distal ear canal.  This did not start after he ran out of his prednisone but rather before. He had recently changed a body wash.  He has used [employer] and a cortisone over-the-counter and it has decreased all the way down to about 10%.  He is now using these creams only a couple times a week.  His exam was unrevealing and there is no discomfort to manipulation of the ear.  I do not think this represents any infection and since he is already had good response thoughts with time and topicals, I am not going to intervene unless it gets worse.

## 2018-04-02 NOTE — Progress Notes (Signed)
   Subjective:    Patient ID: Andrew Fowler, male    DOB: 1960/07/17, 58 y.o.   MRN: [id number]  HPI  Andrew Fowler is here for chronic pain F/U. Please see the A&P for the status of the pt's chronic medical problems.  ROS : per ROS section and in problem oriented charting. All other systems are negative.  PMHx, Soc hx, and / or Fam hx : He and his brother are still living with his cousin's wife.  The wife and a lot of her visitors smoke.  He has no trouble with the ramp into the house but the grass and yard at the end of the ramp gets flooded with agreement and that can prevent his ability to go to doctor's appointments.  Review of Systems  Constitutional: Negative for unexpected weight change.  HENT: Negative for rhinorrhea and sneezing.        Dry mouth B ear itching  Respiratory: Positive for shortness of breath and wheezing. Negative for cough.   Musculoskeletal: Positive for arthralgias and back pain.  Skin: Positive for rash.       Itching lower back psoriasis    Psychiatric/Behavioral: Positive for agitation.       Objective:   Physical Exam Constitutional:      General: He is not in acute distress.    Appearance: Normal appearance. He is obese. He is not ill-appearing or toxic-appearing.  HENT:     Head: Normocephalic and atraumatic.     Right Ear: External ear normal.     Left Ear: External ear normal.     Nose: Nose normal. No congestion or rhinorrhea.  Eyes:     General: No scleral icterus.       Right eye: No discharge.        Left eye: No discharge.     Extraocular Movements: Extraocular movements intact.  Cardiovascular:     Rate and Rhythm: Normal rate and regular rhythm.     Heart sounds: No murmur.  Pulmonary:     Effort: Pulmonary effort is normal.     Breath sounds: Wheezing present. No rales.  Neurological:     General: No focal deficit present.     Mental Status: He is alert. Mental status is at baseline.  Psychiatric:        Mood and  Affect: Mood normal.        Behavior: Behavior normal.        Thought Content: Thought content normal.        Judgment: Judgment normal.           Assessment & Plan:

## 2018-04-02 NOTE — Assessment & Plan Note (Signed)
This problem is chronic and stable. He has made contact with the [employer] but the process got delayed.   PLAN : F/U next appt

## 2018-04-02 NOTE — Assessment & Plan Note (Signed)
This problem is chronic and uncontrolled.  He had successfully quit smoking but due to stress over the holidays, he resumed smoking and is at 1 pack/day.  He wants to quit because he notices it increases his wheezing and also affects his sense of taste and smell.  He uses pro-air about 4 times a week in response to increased wheezing.  He notices he wheezes whenever he smokes too much in the morning, whenever he is in the bathroom, or if there are cooking fumes.  I refilled his pro-air.  He is in agreement to get PFTs but not currently as he wants to quit smoking and see how he does then.  I stressed that I would like to get PFTs done by the end of this calendar year.  PLAN : buproprion for smoking cessation Refill pro-air PFTs before the end of the year

## 2018-04-02 NOTE — Assessment & Plan Note (Signed)
This problem is chronic and stable.  It is managed by his rheumatologist.  He is on Enbrel and prednisone.  He was out of his prednisone for 11 days recently because her office would not fill it without him being seen.  He was not able to get in for an appointment due to bad weather and abruptly ran out.  He has since restarted it.  I also prescribed him hydrocodone for his chronic pain.  PLAN:  Cont current meds Follow with the rheumatologist notes

## 2018-04-02 NOTE — Assessment & Plan Note (Addendum)
This problem is chronic and somewhat uncontrolled.  He just has a patch of psoriasis on his buttock area that is itchy.  It was worse during the summer.  He asked whether it would get worse and spread and since it is due to his TNF alpha medication, I am not certain whether it will or not.  Since it has been over the past 2 years or so, I told him I doubt it would spread but to ask his rheumatologist for opinion since that she uses these medications more frequently.  He uses a mix of a prescription steroid and over-the-counter [place].  PLAN : follow

## 2018-04-14 ENCOUNTER — Other Ambulatory Visit: Payer: Self-pay | Admitting: Internal Medicine

## 2018-04-14 MED ORDER — ALPRAZOLAM 0.5 MG PO TABS: 0.5000 mg | ORAL_TABLET | Freq: Two times a day (BID) | ORAL | 5 refills | Status: DC | PRN

## 2018-04-20 ENCOUNTER — Telehealth: Payer: Self-pay | Admitting: Internal Medicine

## 2018-04-20 NOTE — Telephone Encounter (Signed)
Called pharm, pharmacist states they were only able to give pt 52 tabs of his script, pt will need 68 more in a new script for complete month, the pharmacist states pt always gets script 2 days early, I made him aware that script does state that pt must wait 30 days between scripts, he stated you may want to send it to another pharmacy after this?? He states that they do not stock it at that store on a regular basis. Shall we ask pt if he would like to use another pharmacy? So will need another script for #68 for the month.

## 2018-04-20 NOTE — Telephone Encounter (Signed)
Yes, which pharmacy do I send it to? Will the new pharm just be this once on ongoing?

## 2018-04-20 NOTE — Telephone Encounter (Signed)
[facility] out, he has spoken with them and desires the change.

## 2018-04-20 NOTE — Telephone Encounter (Signed)
Pt needs to a nurse to call, pharmacy gave him 52 of HYDROcodone-acetaminophen ([place]) 10-325 MG tablet they didn't have anymore, but he will need another [facility] remaining pills [id number] [place] [place]  ;pt contact

## 2018-04-21 ENCOUNTER — Other Ambulatory Visit: Payer: Self-pay | Admitting: Internal Medicine

## 2018-04-21 DIAGNOSIS — G894 Chronic pain syndrome: Secondary | ICD-10-CM

## 2018-04-21 MED ORDER — HYDROCODONE-ACETAMINOPHEN 10-325 MG PO TABS: 1.0000 | ORAL_TABLET | Freq: Four times a day (QID) | ORAL | 0 refills | Status: DC | PRN

## 2018-04-21 NOTE — Telephone Encounter (Signed)
Done I did not calculate when partial refill could be filled

## 2018-05-07 DIAGNOSIS — M0549 Rheumatoid myopathy with rheumatoid arthritis of multiple sites: Secondary | ICD-10-CM | POA: Diagnosis not present

## 2018-05-07 DIAGNOSIS — M069 Rheumatoid arthritis, unspecified: Secondary | ICD-10-CM | POA: Diagnosis not present

## 2018-05-07 DIAGNOSIS — G4733 Obstructive sleep apnea (adult) (pediatric): Secondary | ICD-10-CM | POA: Diagnosis not present

## 2018-05-07 DIAGNOSIS — R2689 Other abnormalities of gait and mobility: Secondary | ICD-10-CM | POA: Diagnosis not present

## 2018-05-07 DIAGNOSIS — G8929 Other chronic pain: Secondary | ICD-10-CM | POA: Diagnosis not present

## 2018-05-07 DIAGNOSIS — G894 Chronic pain syndrome: Secondary | ICD-10-CM | POA: Diagnosis not present

## 2018-05-07 DIAGNOSIS — E139 Other specified diabetes mellitus without complications: Secondary | ICD-10-CM | POA: Diagnosis not present

## 2018-05-07 DIAGNOSIS — M79609 Pain in unspecified limb: Secondary | ICD-10-CM | POA: Diagnosis not present

## 2018-05-07 DIAGNOSIS — E119 Type 2 diabetes mellitus without complications: Secondary | ICD-10-CM | POA: Diagnosis not present

## 2018-05-12 ENCOUNTER — Telehealth: Payer: Self-pay

## 2018-05-12 NOTE — Telephone Encounter (Signed)
Yes, pharmacist stated there were 2 refills on hold. Thanks, [employer]

## 2018-05-12 NOTE — Telephone Encounter (Signed)
HYDROcodone-acetaminophen [facility]) 10-325 MG tablet,   Refill request @  [address]  - [place], [place] - [address] [phone] (Phone) [phone] (Fax)

## 2018-05-12 NOTE — Telephone Encounter (Signed)
This nurse called [place] [facility] and spoke with pharmacist, Jarrett Soho.  Pharmacist states pt received partial dose at last pick up (#68, which pt picked up on 04/29/18, please see previous note).  Pharmacist states pt has RX on file, too early for pick up by 4 days.  Pt called and notified, he verbalizes understanding and states he "knew it was too early to pick up but thought he needed to call us and request a RX".  SChaplin, RN,BSN

## 2018-05-12 NOTE — Telephone Encounter (Signed)
Thanks. Should have RF until end of April

## 2018-06-10 ENCOUNTER — Telehealth: Payer: Self-pay | Admitting: Dietician

## 2018-06-10 NOTE — Telephone Encounter (Signed)
Called Mr. Brueggemann to follow up and support weight loss and smoking cessation behavior change he is working on and diabetes eye exam visits in the past year.  He has not been to Dr. Herbert Deaner- has an appointment in late March. He reports some weight loss- now 268# due to decreased portions and appetite. He reports his bowels are moving okay. He has been using Metamucil for 6 months. He still smokes about 1 cigarette a day. We discussed the effects of smoking and not smoking. I advised him that regular check up appointments have been canceled for March and not April yet. He said he would call us next week about his appointment in April.  Follow up again in ~1 month by phone.  Debera Lat, RD 06/10/2018 11:10 AM.

## 2018-06-12 ENCOUNTER — Telehealth: Payer: Self-pay | Admitting: *Deleted

## 2018-06-12 ENCOUNTER — Other Ambulatory Visit: Payer: Self-pay | Admitting: Internal Medicine

## 2018-06-12 DIAGNOSIS — Z993 Dependence on wheelchair: Secondary | ICD-10-CM

## 2018-06-12 NOTE — Telephone Encounter (Signed)
Spoke with Andria Rhein at [facility] for powerchair repairs: new battery, the joystick looked at, and the wheels looked at. Order also faxed to [facility] at [phone]. Jackelyn Poling will contact patient. Hubbard Hartshorn, RN, BSN

## 2018-06-15 NOTE — Telephone Encounter (Signed)
Following message received from Andria Rhein at [facility]: Thank you! Sent to our repairs team to assess.

## 2018-06-16 ENCOUNTER — Telehealth (INDEPENDENT_AMBULATORY_CARE_PROVIDER_SITE_OTHER): Payer: Medicaid Other | Admitting: Internal Medicine

## 2018-06-16 ENCOUNTER — Other Ambulatory Visit: Payer: Self-pay

## 2018-06-16 DIAGNOSIS — F418 Other specified anxiety disorders: Secondary | ICD-10-CM

## 2018-06-16 DIAGNOSIS — E1169 Type 2 diabetes mellitus with other specified complication: Secondary | ICD-10-CM | POA: Diagnosis not present

## 2018-06-16 DIAGNOSIS — F17200 Nicotine dependence, unspecified, uncomplicated: Secondary | ICD-10-CM

## 2018-06-16 DIAGNOSIS — Z79891 Long term (current) use of opiate analgesic: Secondary | ICD-10-CM

## 2018-06-16 DIAGNOSIS — Z0000 Encounter for general adult medical examination without abnormal findings: Secondary | ICD-10-CM

## 2018-06-16 DIAGNOSIS — I10 Essential (primary) hypertension: Secondary | ICD-10-CM

## 2018-06-16 DIAGNOSIS — M05719 Rheumatoid arthritis with rheumatoid factor of unspecified shoulder without organ or systems involvement: Secondary | ICD-10-CM

## 2018-06-16 DIAGNOSIS — J410 Simple chronic bronchitis: Secondary | ICD-10-CM

## 2018-06-16 DIAGNOSIS — G894 Chronic pain syndrome: Secondary | ICD-10-CM

## 2018-06-16 MED ORDER — HYDROCODONE-ACETAMINOPHEN 10-325 MG PO TABS: 1.0000 | ORAL_TABLET | Freq: Four times a day (QID) | ORAL | 0 refills | Status: DC | PRN

## 2018-06-16 MED ORDER — ACETIC ACID 2 % OT SOLN: 4.0000 [drp] | Freq: Three times a day (TID) | OTIC | 0 refills | Status: AC

## 2018-06-16 NOTE — Assessment & Plan Note (Signed)
This problem is chronic and stable. He is on metformin 1000 QD and most recent A1C 6.2 about 6 months ago.  He is on metformin mostly due to its neutral effect on weight or slight increase in weight loss.  He was able to get his diabetes under control with significant weight loss.  He did have a slight increase in his weight subsequently but is now noticing that he is losing weight without cutting back a whole lot on his diet.  He and his brother are eating the same thing but his brother is gaining weight well he is losing weight.  He dropped about 20 pounds since January 9.  I am not concerned about a serious cause for weight loss as he is really been working hard on diet manipulation and also working with Butch Penny to eat healthier.  He would be due for an A1c but I do not want him to come as he is a high risk for coronavirus infection and he is also very concerned about going out.  We will forego A1c testing at this time.  PLAN:  Cont current meds

## 2018-06-16 NOTE — Assessment & Plan Note (Signed)
This problem is chronic and uncontrolled.  He had been smoking a little bit more than a pack a day and is now down to 19 cigarettes a day.  His brother is also trying to quit.  We have tried bupropion but he had side effects to it -nausea-and he stopped it.  He wants to try to quit without any pharmaceuticals as he has been able to do this in the past.  With smoking increases his cough and we discussed that being tobacco free will allow his body to fight off a virus or other insults more easily.  PLAN : follow

## 2018-06-16 NOTE — Progress Notes (Signed)
HPI  Andrew Fowler is participating in a telehealth via telephone ONLY. He consented to the telephone visit. I was in my office and [name] was in hi home. No other people were incolved in the call. Please see the A&P for the status of the pt's chronic medical problems.  ROS : per ROS section and in problem oriented charting. All other systems are negative.  PMHx, Soc hx, and / or Fam hx :  He and his brother are still living with a cousin.  The cousin is 53 years old and is working 7 days a week I believe in social work.  The cousin does not believe in hand hygiene and feels that washing his hands will increase the likelihood he gets a virus.  [name] is doing all he can to not be infected.  His brother is going to the [employer] to get groceries.  This is a telephone encounter between [name] and Larey Dresser on 06/16/2018 for routine follow-up. The visit was conducted with the patient located at home and Larey Dresser at [facility]. The patient's identity was confirmed using their DOB and current address. The patient has consented to being evaluated through a telephone encounter and understands the associated risks/benefits. I personally spent 18 on medical discussion with the conversation ending at 1130.   Review of Systems  Constitutional: Positive for weight loss.  HENT:       Itching in ear canals bilaterally  Respiratory: Positive for cough.   Musculoskeletal: Positive for back pain.  Psychiatric/Behavioral: The patient is nervous/anxious.

## 2018-06-16 NOTE — Assessment & Plan Note (Signed)
This problem is chronic and worsened.  He is having extra anxiety over the current viral pandemic.  We discussed basic and hygiene and to wipe down surfaces that his roommate frequently touches.  We went over the symptoms that should trigger a 911 and symptoms that should trigger a phone call to [facility].  I made certain he had our after hours contact number.  He has enough xanax to get him through until about July.  PLAN:  Cont current meds

## 2018-06-16 NOTE — Assessment & Plan Note (Signed)
This problem is chronic.  At his last appointment, he complained of bilateral ear itching.  His exam was normal.  He states they continue to itch but not as bad as before.  It is now been going on 3 to 6 months.  He got over-the-counter cortisone cream and is using it about 3 times a week.  Even though his exam is normal, this could be a mild external otitis.  He is immunosuppressed but due to the very mild symptoms, I do not feel he would benefit from an antibiotic and I am going to try topical acetic acid and hydrocortisone.  I prescribed that for 7 days.  He will not be able to pick it up until April 1.  PLAN: Trial of topical acetic acid and hydrocortisone for 7 days for possible external otitis bilaterally

## 2018-06-16 NOTE — Assessment & Plan Note (Signed)
This problem is chronic and stable.  His blood pressure had been excellent at his last check.  He remains on lisinopril 10 mg once a day.  He is having no side effects to this medication.  He is unable to check his blood pressure at home but due to recent excellent control, I do not feel compelled to have him come into the clinic just to check his blood pressure.  PLAN:  Cont current meds  BP Readings from Last 3 Encounters:  04/02/18 126/81  12/25/17 116/71  06/26/17 131/80

## 2018-06-16 NOTE — Assessment & Plan Note (Signed)
This problem is chronic and stable.  His management is by Dr. Trudie Reed of rheumatology.  He is on leflunomide and Humira and prednisone.  He is supposed to see her in April on the 27th but he is concerned about the virus and may need to cancel this.  I told him if he had difficulty getting his refills to be persistent or call me as I likely would be able to refill his prednisone but am not certain whether I could fill the leflunomide.  I definitely could not fill the Humira.  PLAN: follow

## 2018-06-16 NOTE — Assessment & Plan Note (Signed)
This problem is chronic and improved.  He is losing weight primarily through diet manipulation.  I congratulated him.  PLAN : follow

## 2018-06-16 NOTE — Assessment & Plan Note (Signed)
This problem is chronic and stable.  He has not had to use his albuterol much recently and his pharmacy actually called him to get him to refill it and he said he still had plenty.  We discussed that he is at high risk for both coronavirus and went over basic preventative measures.  PLAN:  Cont current meds

## 2018-06-16 NOTE — Assessment & Plan Note (Signed)
This problem is chronic and stable.  He is on hydrocodone 10/325.  He gets 120 every 30 days.  This is doing well.  He has never had any red or orange flag behavior.  I am overdue getting a urine drug screen as his most recent was in August 2018.  I will need to do this when he next comes into [facility].  I am giving him 3 refills which will likely and hopefully get him through this pandemic.  PLAN:  Cont current meds UDS at his next appointment

## 2018-06-25 ENCOUNTER — Encounter: Payer: Medicaid Other | Admitting: Internal Medicine

## 2018-07-07 ENCOUNTER — Telehealth: Payer: Self-pay | Admitting: Dietician

## 2018-07-09 NOTE — Telephone Encounter (Signed)
Follow up call to provide support for behavior change efforts: he says his goal right now is to maintain his sanity. During social distancing du to the covid 19 virus.  His weight is unchanged, he is smoking 1 pack a day.His blood sugar is 116-138.   We discussed importance of routines and ways to calm himself.  Will send him finger labrynth. Consider/assess for referral to behavioral counselor. Andrew Fowler agrees to a follow up by phone in 1 month. Debera Lat, RD 07/09/2018 3:24 PM.

## 2018-08-12 ENCOUNTER — Other Ambulatory Visit: Payer: Self-pay

## 2018-08-12 ENCOUNTER — Telehealth: Payer: Self-pay | Admitting: *Deleted

## 2018-08-12 NOTE — Telephone Encounter (Addendum)
Information was faxed to [place] for PA for Hydrocodone Acetaminophen 10/-325 mg tablets.  Awaiting Determination.  Sander Nephew, RN 08/12/2018 2:48 PM.  PA for Hydrocodone -Acetaminophen 10-325 mg approved  08/12/2018 thru 02/08/2019.  Sander Nephew, RN 08/14/2018 9:27 AM.

## 2018-08-12 NOTE — Telephone Encounter (Signed)
Returned pt's call - no answer; left message on self-identified vm to give [place] a call back.

## 2018-08-12 NOTE — Telephone Encounter (Signed)
Requesting to speak with a nurse about pain med. Please call back.

## 2018-08-12 NOTE — Telephone Encounter (Signed)
Pt calls for PA on pain med, he states pharmacy is going to be closed Sunday and Monday so hes getting worried because it was really due yesterday

## 2018-08-14 NOTE — Telephone Encounter (Signed)
See Leonia Corona telephone encounter.

## 2018-08-27 ENCOUNTER — Telehealth: Payer: Self-pay | Admitting: *Deleted

## 2018-08-27 ENCOUNTER — Ambulatory Visit (INDEPENDENT_AMBULATORY_CARE_PROVIDER_SITE_OTHER): Payer: Medicaid Other | Admitting: Internal Medicine

## 2018-08-27 ENCOUNTER — Other Ambulatory Visit: Payer: Self-pay

## 2018-08-27 DIAGNOSIS — E1169 Type 2 diabetes mellitus with other specified complication: Secondary | ICD-10-CM | POA: Diagnosis not present

## 2018-08-27 DIAGNOSIS — E785 Hyperlipidemia, unspecified: Secondary | ICD-10-CM | POA: Diagnosis not present

## 2018-08-27 DIAGNOSIS — G8929 Other chronic pain: Secondary | ICD-10-CM | POA: Diagnosis not present

## 2018-08-27 DIAGNOSIS — F17200 Nicotine dependence, unspecified, uncomplicated: Secondary | ICD-10-CM

## 2018-08-27 DIAGNOSIS — M05719 Rheumatoid arthritis with rheumatoid factor of unspecified shoulder without organ or systems involvement: Secondary | ICD-10-CM

## 2018-08-27 DIAGNOSIS — G894 Chronic pain syndrome: Secondary | ICD-10-CM

## 2018-08-27 DIAGNOSIS — I10 Essential (primary) hypertension: Secondary | ICD-10-CM | POA: Diagnosis not present

## 2018-08-27 DIAGNOSIS — E7800 Pure hypercholesterolemia, unspecified: Secondary | ICD-10-CM

## 2018-08-27 DIAGNOSIS — Z79891 Long term (current) use of opiate analgesic: Secondary | ICD-10-CM

## 2018-08-27 DIAGNOSIS — Z7984 Long term (current) use of oral hypoglycemic drugs: Secondary | ICD-10-CM

## 2018-08-27 DIAGNOSIS — M069 Rheumatoid arthritis, unspecified: Secondary | ICD-10-CM

## 2018-08-27 DIAGNOSIS — F418 Other specified anxiety disorders: Secondary | ICD-10-CM

## 2018-08-27 DIAGNOSIS — Z79899 Other long term (current) drug therapy: Secondary | ICD-10-CM

## 2018-08-27 DIAGNOSIS — E669 Obesity, unspecified: Secondary | ICD-10-CM

## 2018-08-27 DIAGNOSIS — F17210 Nicotine dependence, cigarettes, uncomplicated: Secondary | ICD-10-CM

## 2018-08-27 DIAGNOSIS — Z7952 Long term (current) use of systemic steroids: Secondary | ICD-10-CM

## 2018-08-27 MED ORDER — HYDROCODONE-ACETAMINOPHEN 10-325 MG PO TABS: 1.0000 | ORAL_TABLET | Freq: Four times a day (QID) | ORAL | 0 refills | Status: DC | PRN

## 2018-08-27 MED ORDER — ALPRAZOLAM 0.5 MG PO TABS: 0.5000 mg | ORAL_TABLET | Freq: Two times a day (BID) | ORAL | 5 refills | Status: DC | PRN

## 2018-08-27 MED ORDER — METFORMIN HCL 1000 MG PO TABS: ORAL_TABLET | ORAL | 3 refills | Status: DC

## 2018-08-27 MED ORDER — LISINOPRIL 10 MG PO TABS: 10.0000 mg | ORAL_TABLET | Freq: Every day | ORAL | 3 refills | Status: DC

## 2018-08-27 MED ORDER — ALBUTEROL SULFATE HFA 108 (90 BASE) MCG/ACT IN AERS: 1.0000 | INHALATION_SPRAY | RESPIRATORY_TRACT | 5 refills | Status: DC | PRN

## 2018-08-27 NOTE — Assessment & Plan Note (Signed)
This problem is chronic and has been stable.  His last A1c was in October and was 6.2.  He is on metformin 1000 QD mostly for the weight uncontrolled.  He checks his CBG periodically every few weeks and they have been 108, 123, and 111.  He needs an A1c and he has to go to his rheumatologist to get blood work and I asked him to ask her if she would be willing to draw the A1c so he does not have to make a special trip here for just an A1c.  PLAN:  Cont current meds Try to get an A1c

## 2018-08-27 NOTE — Assessment & Plan Note (Signed)
This problem is chronic and has been stable.  He is on lisinopril 10 mg.  He has a blood pressure cuff at home that the batteries are good and has not been able to check his pressure.  I will be able to request his rheumatologist note and see the blood pressure at her office.  PLAN:  Cont current meds   BP Readings from Last 3 Encounters:  04/02/18 126/81  12/25/17 116/71  06/26/17 131/80

## 2018-08-27 NOTE — Assessment & Plan Note (Signed)
This problem is chronic and uncontrolled.  He is smoking about a pack per day.  At some point he will need PFTs but he is trying to stay out of the public and at home and now is not the time to schedule an elective procedure.  We will continue to discuss PFTs the situation changes.  He uses those albuterol about 3 times a week.  This frequency of usage would indicate his asthma is not controlled and he would need a low dose inhaled glucocorticosteroid.  I would like to get PFTs as it is quite likely he has COPD rather than asthma as he frequently has wheezing on auscultation.  COPD would indicate adding on a LABA or LAMA instead of the inhaled steroid.  PLAN : Continue albuterol as needed Continue PFT discussion.

## 2018-08-27 NOTE — Assessment & Plan Note (Signed)
This problem is chronic and stable.  It is managed by Dr. Trudie Reed rheumatology.  He remains on leflunomide, Humira, and prednisone.  His left shoulder flared recently and he is using ice and heat and is already having improvement in the shoulder pain.  A flare like this is not uncommon for him.  His big toe is also flared but only for a half a day and has already resolved.  PLAN : follow Dr Trudie Reed' notes

## 2018-08-27 NOTE — Assessment & Plan Note (Addendum)
This problem is chronic and uncontrolled.  He had been on simvastatin 40 mg, a moderate intensity statin, I have not documented about it since 2018.  He has not picked it up anytime recently.  His 10-year cardiovascular risk is 20% if you do not consider he has diabetes but is 32% if he does have diabetes.  Regardless, his 10-year risk is high enough that I need to rediscuss statin at his next appointment.  This will be for primary prevention.  PLAN : Discuss statin resumption at his next appointment

## 2018-08-27 NOTE — Progress Notes (Signed)
   Subjective:    Patient ID: Andrew Fowler, male    DOB: 11/28/1960, 58 y.o.   MRN: [id number]  HPI  TELEHEALTH - AUDIO only   This is a telephone encounter between Andrew Fowler and Larey Dresser on 08/27/2018 for chronic pain F/U. The visit was conducted with the patient located at home and Larey Dresser at [facility]. The patient's identity was confirmed using their DOB and current address. The patient has consented to being evaluated through a telephone encounter and understands the associated risks (an examination cannot be done and the patient may need to come in for an appointment) / benefits (allows the patient to remain at home, decreasing exposure to coronavirus). I personally spent 12 minutes on medical discussion.    Review of Systems  Constitutional:       Intentional weight loss  Musculoskeletal: Positive for arthralgias and gait problem.  Psychiatric/Behavioral: The patient is nervous/anxious.        Objective:   Physical Exam  No physical exam as this is a telephone encounter.  He was able to speak in full sentences and he did not sound dyspneic.     Assessment & Plan:

## 2018-08-27 NOTE — Telephone Encounter (Addendum)
Call made to [place] to cancel [place] [facility] [place]6/4/202010:56 AM    Call made to Dr. Cathleen Fears has an upcoming appt on 6/15-Request made to have Dr Trudie Reed office obtain to Hgb A1C at pt's upcoming visit.

## 2018-08-27 NOTE — Assessment & Plan Note (Signed)
This problem is chronic and stable.  He is on alprazolam 0.5 mg and gets a 40 pills every 30 days.  He is doing well on this quantity despite his increased anxiety over the pandemic and his living situation.  He has no side effects to this medication and I strongly believe that the benefits outweigh the risk.  PLAN:  Cont current meds

## 2018-08-27 NOTE — Assessment & Plan Note (Signed)
This problem is chronic and improved.  He has lost another 20 pounds since January and is now down to 267 pounds.  I congratulated him on his success.  PLAN : Continue to follow

## 2018-08-27 NOTE — Assessment & Plan Note (Signed)
This problem is chronic and stable.  He is on hydrocodone 7.5 mg and received 120 every 30 days.  The etiology of his chronic pain is multifactorial including RA, OA, & DDD.  He is due for a UDS that I am not going to require him to come in just for this as they have always been as expected in the past.  The benefits of this medication outweigh the risk and he is having no side effects to chronic opioid therapy. He did [facility] [address] which I did not.  PLAN:  Cont current meds

## 2018-08-28 NOTE — Addendum Note (Signed)
Addended by: Larey Dresser A on: 08/28/2018 10:34 AM   Modules accepted: Level of Service

## 2018-09-07 DIAGNOSIS — M2550 Pain in unspecified joint: Secondary | ICD-10-CM | POA: Diagnosis not present

## 2018-09-07 DIAGNOSIS — M5030 Other cervical disc degeneration, unspecified cervical region: Secondary | ICD-10-CM | POA: Diagnosis not present

## 2018-09-07 DIAGNOSIS — Z79899 Other long term (current) drug therapy: Secondary | ICD-10-CM | POA: Diagnosis not present

## 2018-09-07 DIAGNOSIS — K760 Fatty (change of) liver, not elsewhere classified: Secondary | ICD-10-CM | POA: Diagnosis not present

## 2018-09-07 DIAGNOSIS — M170 Bilateral primary osteoarthritis of knee: Secondary | ICD-10-CM | POA: Diagnosis not present

## 2018-09-07 DIAGNOSIS — E669 Obesity, unspecified: Secondary | ICD-10-CM | POA: Diagnosis not present

## 2018-09-07 DIAGNOSIS — Z6836 Body mass index (BMI) 36.0-36.9, adult: Secondary | ICD-10-CM | POA: Diagnosis not present

## 2018-09-07 DIAGNOSIS — M0589 Other rheumatoid arthritis with rheumatoid factor of multiple sites: Secondary | ICD-10-CM | POA: Diagnosis not present

## 2018-09-07 DIAGNOSIS — E119 Type 2 diabetes mellitus without complications: Secondary | ICD-10-CM | POA: Diagnosis not present

## 2018-09-07 LAB — HEMOGLOBIN A1C: Hemoglobin A1C: 6.5

## 2018-09-24 ENCOUNTER — Encounter: Payer: Self-pay | Admitting: Internal Medicine

## 2018-09-30 ENCOUNTER — Telehealth: Payer: Self-pay | Admitting: Dietician

## 2018-09-30 NOTE — Telephone Encounter (Addendum)
Called to follow up with Andrew Fowler to provide support for his weight loss and quitting smoking goals. He reports smoking up and down dur to stress. His brother lives with him; he is still not going out very much except to  [facility] [employer]. He reports his Arthritis doctor was concerned about his blood sugars, so he restarted testing daily and 118 is the highest fasting value number he has gotten. We discussed that his recent  A1c at 6.5% is a bit higher than they have been previously. He reports weight loss due to able to get back to usual diet now that more foods are available at th grocery. He reports his weight is  255# now down from 276#.   Wt Readings from Last 5 Encounters:  04/02/18 287 lb 12.8 oz (130.5 kg)  12/25/17 297 lb 14.4 oz (135.1 kg)  06/26/17 263 lb 4.8 oz (119.4 kg)  06/19/17 266 lb 8 oz (120.9 kg)  03/12/17 271 lb 9.6 oz (123.2 kg)   His goal is to eliminate bad fats- to try to decrease his 'liver levels' that are very high. Keeping trans fat sat fats to a minimum. He was resistant to meatless meals, offers of nutrient dense recipes. He reports intake includes beef,  pork, tuna, salmon, hamburger, salt-less potato chips, rice cakes, vegetables.  He agreed to follow by phone in a two months. Andrew Fowler, RD 09/30/2018 4:52 PM.

## 2018-10-07 ENCOUNTER — Encounter: Payer: Self-pay | Admitting: Internal Medicine

## 2018-10-07 DIAGNOSIS — M0589 Other rheumatoid arthritis with rheumatoid factor of multiple sites: Secondary | ICD-10-CM | POA: Diagnosis not present

## 2018-10-07 DIAGNOSIS — Z79899 Other long term (current) drug therapy: Secondary | ICD-10-CM | POA: Diagnosis not present

## 2018-10-12 ENCOUNTER — Telehealth: Payer: Self-pay

## 2018-10-12 NOTE — Telephone Encounter (Signed)
I guess I did not realize he was seeing GI.  He was last referred and seen in 2015.  Is a new issue or is this just about the LFTs?

## 2018-10-12 NOTE — Telephone Encounter (Signed)
He stated his rheumatologist suggested he get a referral and see gi, he stated they told him he didn't need another referral and made the appt, but yes for liver

## 2018-10-12 NOTE — Telephone Encounter (Signed)
Requesting to speak with Dr. Lynnae January. Please call back.

## 2018-10-12 NOTE — Telephone Encounter (Signed)
Pt calls and states he has appt w/ dr Carlean Purl coming up to see about his liver. He has lost 34lbs now and is trying to follow what he finds online about what to eat to improve his liver.  Triage spoke to pt about not worrying at the moment. Give himself time to see dr Carlean Purl and what dr Software engineer suggests. He is agreeable. He ask if dr Software engineer has any thoughts on what else he can do to call him.

## 2018-10-13 NOTE — Telephone Encounter (Signed)
Thanks He has had a full [facility]U inc liver bx. I would encourage a tele GI appt if rheum wants him to see GI again

## 2018-11-12 ENCOUNTER — Ambulatory Visit: Payer: Medicaid Other | Admitting: Internal Medicine

## 2018-11-12 ENCOUNTER — Other Ambulatory Visit (INDEPENDENT_AMBULATORY_CARE_PROVIDER_SITE_OTHER): Payer: Medicaid Other

## 2018-11-12 ENCOUNTER — Encounter: Payer: Self-pay | Admitting: Internal Medicine

## 2018-11-12 VITALS — BP 100/70 | HR 96 | Temp 98.2°F | Ht 70.0 in | Wt 251.2 lb

## 2018-11-12 DIAGNOSIS — F17200 Nicotine dependence, unspecified, uncomplicated: Secondary | ICD-10-CM

## 2018-11-12 DIAGNOSIS — E669 Obesity, unspecified: Secondary | ICD-10-CM | POA: Diagnosis not present

## 2018-11-12 DIAGNOSIS — K7581 Nonalcoholic steatohepatitis (NASH): Secondary | ICD-10-CM | POA: Diagnosis not present

## 2018-11-12 LAB — PROTIME-INR
INR: 1 ratio (ref 0.8–1.0)
Prothrombin Time: 11.8 s (ref 9.6–13.1)

## 2018-11-12 LAB — HEPATIC FUNCTION PANEL
ALT: 115 U/L — ABNORMAL HIGH (ref 0–53)
AST: 65 U/L — ABNORMAL HIGH (ref 0–37)
Albumin: 4.1 g/dL (ref 3.5–5.2)
Alkaline Phosphatase: 354 U/L — ABNORMAL HIGH (ref 39–117)
Bilirubin, Direct: 0.3 mg/dL (ref 0.0–0.3)
Total Bilirubin: 0.8 mg/dL (ref 0.2–1.2)
Total Protein: 8.1 g/dL (ref 6.0–8.3)

## 2018-11-12 NOTE — Assessment & Plan Note (Addendum)
This is probably what is driving the changes in his LFTs, leflunomide can do that.  Reassess with labs and an ultrasound.  Further plans pending that.  He is advised to limit his alcohol intake much more than he has.  It sounds like an uncommon drinker but drinks to excess at times Orders Placed This Encounter  Procedures  . US Abdomen Complete  . Hepatic function panel  . Protime-INR

## 2018-11-12 NOTE — Assessment & Plan Note (Signed)
He is congratulated and encouraged on weight loss.  Some Mediterranean diet information provided which could be helpful for his liver disease and obesity.

## 2018-11-12 NOTE — Assessment & Plan Note (Signed)
He is trying to quit and encouraged to continue to do so.

## 2018-11-12 NOTE — Progress Notes (Signed)
DAKHARI ZUVER 58 y.o. 06/07/1960 [id number]  Assessment & Plan:  NASH (nonalcoholic steatohepatitis), ? some EtOH contribution and fibrosis  This is probably what is driving the changes in his LFTs, leflunomide can do that.  Reassess with labs and an ultrasound.  Further plans pending that.  He is advised to limit his alcohol intake much more than he has.  It sounds like an uncommon drinker but drinks to excess at times Orders Placed This Encounter  Procedures   US Abdomen Complete   Hepatic function panel   Protime-INR     Class 2 obesity in adult He is congratulated and encouraged on weight loss.  Some Mediterranean diet information provided which could be helpful for his liver disease and obesity.  Smoker He is trying to quit and encouraged to continue to do so.  CC: Bartholomew Crews, MD  Marella Chimes, PA-C   Subjective:   Chief Complaint: Abnormal liver tests  HPI The patient is a 59 year old white man with known Karlene Lineman liver disease, last seen by me in 2015.  He is here for abnormal LFTs again.  He had a liver biopsy at that time with steatohepatitis fibrosis and scarring.  I have not seen him.  Follow-up that demonstrated ALT 86 AST 59 alk phos 246 and normal bilirubin in January.  Previously the alk phos has been elevated and the transaminase is only slightly not so much so in the last year or so.  He then had an AST 75 ALT 105 alk phos 286 in June, his leflunomide was held but alk phos rose AST 87 ALT 145 with normal bilirubin.  Platelets have been normal close to it except for a transient one-time decrease very mild He misses Leflunomidefter 2 weeks of being off and he started to have more joint aches.    He sometimes takes up to 10 beers on "special occasions" he is losing weight intentionally..  He has changed his diet some and is trying to avoid packaged foods and too much salt.  Wt Readings from Last 3 Encounters:  11/12/18 251 lb 4 oz (114 kg)  04/02/18  287 lb 12.8 oz (130.5 kg)  12/25/17 297 lb 14.4 oz (135.1 kg)   He has done Hemoccults for colon cancer screening.  He has not had a colonoscopy GI review of systems otherwise negative  His brother is present and contributes to the history some today. No Known Allergies Current Meds  Medication Sig   ACCU-CHEK SOFTCLIX LANCETS lancets Check blood sugar once a day   Adalimumab (HUMIRA [place]) Inject 40 mg into the skin once a week.   albuterol (VENTOLIN HFA) 108 (90 Base) MCG/ACT inhaler Inhale 1-2 puffs into the lungs every 4 (four) hours as needed. shortness of breath   ALPRAZolam (XANAX) 0.5 MG tablet Take 1 tablet (0.5 mg total) by mouth 2 (two) times daily as needed for anxiety.   Blood Glucose Monitoring Suppl (ACCU-CHEK AVIVA PLUS) w/Device KIT Check blood sugar once a day   fluocinonide-emollient (LIDEX-E) 0.05 % cream Apply 1 application topically 2 (two) times daily. Stop using when the skin is no longer thick. Do not use on normal skin.   glucose blood (ACCU-CHEK AVIVA PLUS) test strip Check blood sugar once a day   HYDROcodone-acetaminophen (NORCO) 10-325 MG tablet Take 1 tablet by mouth every 6 (six) hours as needed.   lisinopril (ZESTRIL) 10 MG tablet Take 1 tablet (10 mg total) by mouth daily.   metFORMIN (GLUCOPHAGE) 1000 MG tablet  TAKE 1 TABLET BY MOUTH EVERY DAY WITH BREAKFAST   predniSONE (DELTASONE) 5 MG tablet Take 5 mg by mouth daily.   simvastatin (ZOCOR) 40 MG tablet Take 1 tablet (40 mg total) by mouth daily.   Past Medical History:  Diagnosis Date   Abnormal laboratory test result 07/04/2011   Immunofixation electrophoresis 3/13 showed slightly restricted mobility in the IgG and kappa lanes and suggested repeat end of year.    Anxiety associated with depression 04/19/2011   On chronic benzos and SSRI's. Gets panic attacks. Does not see mental health.   Chronic pain syndrome 04/19/2011   Combination of RA, obesity, OA, & DDD. Has seen Dr Sharol Given & s/p R  total hip arthroplasty 2/2 OA & ? AVN. s/p L4-L5 laminectomy. Lumbar MRI 4/11 : L3-L4 mod central canal narrowing.  Cervical MRI 6/11 : multi-level DDD and L foraminal stenosis at C4-5 and C5-6.    Chronic venous insufficiency 2013   ECHO 2013 was normal. LFT's, creatinine, TSH all normal. Alb a bit low.   COPD (chronic obstructive pulmonary disease) ([place])    Per pt, he has a diagnosis of COPD. No PFT's. Uses Alb MDI less than once a month.   Diabetes mellitus    Non-insulin dependent Type II.   Elevated liver function tests 05/17/2011   04/2010. Increased GGT (pt uses ETOH). AMA negative. ABD U/S limited but otherwise negative. Follow Alk phos level. AST & ALT also elevated. False + IgM Hep B Core Ab. Hep B viral load negative.   Same pattern during hospitalization 2010 : highest alkaline phosphatase was 355, AST 259, and ALT 871. ANA, rheumatoid factor, ceruloplasmin, CMV IgM, alpha antitrypsin, and AMA, all within normal limits.   Gout    Pt has never had a crystal diagnosis.   Hepatitis B antibody positive 2013   IgM Hep B core Ab + but Hepatitis B viral load negative.    Hyperlipidemia    At goal of LDL <100 with statin.   Hypertension    ACEI monotherapy   NASH (nonalcoholic steatohepatitis), ? some EtOH contribution and fibrosis  05/17/2011   Elevated since 05/2008 during hospitalization. Relatively stable. Extensive W/U and no etiology but likely fatty liver 2/2 obesity despite normal imaging.  Hepatitis - all negative. There was an initial + IgM Hep B core Ab but repeat testing was negative and Hep B viral load was negative.  TTG 06/15/2013  ANA x 2 (05/07/11 and 06/13/2008) ASMA x2 (07/18/2011 negative & weakly + at 21 on 05/27/2011 & nega   Obesity, Class III, BMI 40-49.9 (morbid obesity) ([place])    Obstructive sleep apnea 08/21/08   Sleep study AHI 16.4 with desat to 66%. CPAP of 17 decreased AHI to 0.9. Non-compliant with CPAP.   RA (rheumatoid arthritis) ([place]) 2013   Shortness of  breath    Tobacco abuse    Per pt, he has a diagnosis of COPD. Need to locate PFT's.   Past Surgical History:  Procedure Laterality Date   LAMINECTOMY  1995   L4-L5   TONSILLECTOMY     TOTAL HIP ARTHROPLASTY Left 2009   Social History   Social History Narrative   He is disabled and single without children   Lives with brother and his family in [place]. Nonambulatory at baseline given his RA and weight.  Uses a scooter   Alcohol intermittent up to 10 beers at times though that seems infrequent and most days he does not drink at all   Smoker  trying to quit   No smokeless tobacco no drug use   family history includes Diabetes in his father and mother; Heart attack in his mother; Heart disease in his mother; Kidney disease in his father; Obesity in his brother.   Review of Systems All other review of systems negative.  He does use a scooter because he cannot ambulate due to knee pain.  Objective:   Physical Exam _0  100/70 (BP Location: Left Arm, Patient Position: Sitting, Cuff Size: Normal)    Pulse 96    Temp 98.2 F (36.8 C)    Ht _1  (1.778 m)    Wt 251 lb 4 oz (114 kg)    BMI 36.05 kg/m @  General:  Well-developed, well-nourished and in no acute distress - obese Eyes:  anicteric.  Neck:   supple w/o thyromegaly or mass.  Lungs: Both lung fields have inspiratory and expiratory wheezing moderate with expiratory phase equal to inspiratory phase Heart:  S1S2, no rubs, murmurs, gallops. Abdomen:  obese soft, non-tender, no hepatosplenomegaly, or mass and BS+.  There is a small to medium diastases recti and a very small umbilical hernia Lymph:  no cervical or supraclavicular adenopathy. Extremities:   Trace LE ankleedema, no cyanosis or clubbing Skin   no rash. No stigmata of CLD Neuro:  A&O x 3.  Psych:  appropriate mood and  Affect.   Data Reviewed: See HPI

## 2018-11-12 NOTE — Patient Instructions (Addendum)
Keep working on losing weight - that is so important.  Mediterranea diet plan  is good for you. Look at that and use it.  Remember to limit alcohol.  Quit smoking also - very important also.  You will have lab tests and an ultrasound and we will call results and plans.   You have been scheduled for an abdominal ultrasound at [facility] on 11/24/2018 at 9:30AM. Please arrive 20 minutes prior to your appointment for registration. Make certain not to have anything to eat or drink 6 hours prior to your appointment. Should you need to reschedule your appointment, please contact radiology at [phone]. This test typically takes about 30 minutes to perform.   I appreciate the opportunity to care for you. Gatha Mayer, MD, [facility]

## 2018-11-18 ENCOUNTER — Other Ambulatory Visit: Payer: Self-pay

## 2018-11-18 DIAGNOSIS — R7401 Elevation of levels of liver transaminase levels: Secondary | ICD-10-CM

## 2018-11-19 ENCOUNTER — Telehealth: Payer: Self-pay | Admitting: *Deleted

## 2018-11-19 ENCOUNTER — Other Ambulatory Visit: Payer: Self-pay

## 2018-11-19 ENCOUNTER — Ambulatory Visit (INDEPENDENT_AMBULATORY_CARE_PROVIDER_SITE_OTHER): Payer: Medicaid Other | Admitting: Internal Medicine

## 2018-11-19 DIAGNOSIS — L409 Psoriasis, unspecified: Secondary | ICD-10-CM

## 2018-11-19 DIAGNOSIS — E785 Hyperlipidemia, unspecified: Secondary | ICD-10-CM | POA: Diagnosis not present

## 2018-11-19 DIAGNOSIS — Z79899 Other long term (current) drug therapy: Secondary | ICD-10-CM | POA: Diagnosis not present

## 2018-11-19 DIAGNOSIS — G894 Chronic pain syndrome: Secondary | ICD-10-CM

## 2018-11-19 DIAGNOSIS — Z7952 Long term (current) use of systemic steroids: Secondary | ICD-10-CM

## 2018-11-19 DIAGNOSIS — Z79891 Long term (current) use of opiate analgesic: Secondary | ICD-10-CM

## 2018-11-19 DIAGNOSIS — F17200 Nicotine dependence, unspecified, uncomplicated: Secondary | ICD-10-CM

## 2018-11-19 DIAGNOSIS — K7581 Nonalcoholic steatohepatitis (NASH): Secondary | ICD-10-CM | POA: Diagnosis not present

## 2018-11-19 DIAGNOSIS — M05719 Rheumatoid arthritis with rheumatoid factor of unspecified shoulder without organ or systems involvement: Secondary | ICD-10-CM

## 2018-11-19 DIAGNOSIS — E1169 Type 2 diabetes mellitus with other specified complication: Secondary | ICD-10-CM

## 2018-11-19 DIAGNOSIS — M069 Rheumatoid arthritis, unspecified: Secondary | ICD-10-CM | POA: Diagnosis not present

## 2018-11-19 DIAGNOSIS — E7800 Pure hypercholesterolemia, unspecified: Secondary | ICD-10-CM

## 2018-11-19 MED ORDER — HYDROCODONE-ACETAMINOPHEN 10-325 MG PO TABS: 1.0000 | ORAL_TABLET | Freq: Four times a day (QID) | ORAL | 0 refills | Status: DC | PRN

## 2018-11-19 MED ORDER — ALBUTEROL SULFATE HFA 108 (90 BASE) MCG/ACT IN AERS: 1.0000 | INHALATION_SPRAY | RESPIRATORY_TRACT | 0 refills | Status: DC | PRN

## 2018-11-19 MED ORDER — ATORVASTATIN CALCIUM 40 MG PO TABS: 40.0000 mg | ORAL_TABLET | Freq: Every day | ORAL | 3 refills | Status: DC

## 2018-11-19 MED ORDER — FLUOCINONIDE-E 0.05 % EX CREA: 1.0000 "application " | TOPICAL_CREAM | Freq: Two times a day (BID) | CUTANEOUS | 1 refills | Status: DC

## 2018-11-19 NOTE — Assessment & Plan Note (Signed)
This problem is chronic and stable.  He is on hydrocodone 10 mg and gets 120 a month.  I reviewed the [facility] narcotic database and it is appropriate.  His last urine drug screen was 2 years ago so it is overdue but the last 2 appointments have been telehealth.  There have never been any red flag or orange flag behavior.  His pain is multifactorial including RA, OA, and degenerative disc disease.  I believe that the benefits of the medication outweigh the risk.  I refilled his hydrocodone for 3 months.  PLAN:  Cont current meds

## 2018-11-19 NOTE — Assessment & Plan Note (Signed)
This problem is chronic and stable.  He had to stop the leflunomide secondary to elevated LFTs.  His symptoms were okay for about 2-1/2 weeks but then he started having increased pain in his elbows, hands, and fingers.  Overall, it is not too unbearable and he is currently maintaining on just Humira and prednisone.  PLAN:  Cont current meds

## 2018-11-19 NOTE — Assessment & Plan Note (Signed)
This problem is chronic and uncontrolled.  He had been on simvastatin, moderate intensity statin for primary prevention, but is currently not taking it.  I have calculated his 10-year cardiovascular risk and it was significantly elevated.  We discussed statin therapy and he is in agreement to start.  I am going to start pravastatin 40 mg which is a high intensity statin but may increase it to 80 mg if he tolerates a 40 mg.  Pravastatin is on the [facility] formulary.  I will check a lipid panel in 6 to 8 weeks so when he follows up in [place].  PLAN : Pravastatin 40 mg Lipid panel at his next appointment

## 2018-11-19 NOTE — Assessment & Plan Note (Signed)
This problem is chronic and uncontrolled.  I reviewed his recent GI note.  NASH was felt to be the likely diagnosis with possibly some contribution from alcohol.  Leflunomide can also cause elevated LFTs but that has been stopped.  His LFTs were reassessed after the GI appointment and they were elevated like they have been in the past but in the past, they would return back to normal with time.  He is going to go back next week for viral hepatitis studies.  He will also get an ultrasound to assess the degree of fibrosis.  I provided reassurance today in that he has had elevations in LFTs before followed by normalization.  He states he has quit all alcohol.  PLAN : F/U labs and GI recs

## 2018-11-19 NOTE — Telephone Encounter (Addendum)
Call to [employer] for PA for [employer] .025% Cream.   PA [phone] I [phone].  Awaiting determination.   Sander Nephew, RN  11/19/2018 3:14 PM.     Call to [place] PA for [place] 0.05% was denied.  Patient will need to try and fail the 2 preferred Betamethasone Creme or ointment. Or Triamcinolone Creme.  Message to be sent to Dr. Lynnae January to consider a change.  Sander Nephew, RN 11/19/2018 4:07 PM.

## 2018-11-19 NOTE — Progress Notes (Signed)
   Subjective:    Patient ID: Andrew Fowler, male    DOB: [birth year], 58 y.o.   MRN: [id number]  [place] only   This is a telephone encounter between Andrew Fowler and Larey Dresser on 11/19/2018 for pain F/U. The visit was conducted with the patient located at home and Larey Dresser at [facility]. The patient's identity was confirmed using their DOB and current address. The patient has consented to being evaluated through a telephone encounter and understands the associated risks (an examination cannot be done and the patient may need to come in for an appointment) / benefits (allows the patient to remain at home, decreasing exposure to coronavirus). I personally spent 15 minutes on medical discussion.   HPI  Andrew Fowler had appt for pain. Please see the A&P for the status of the pt's chronic medical problems.  ROS : per ROS section and in problem oriented charting. All other systems are negative.  PMHx, Soc hx, and / or Fam hx : Lives with brother.  Review of Systems  Constitutional: Negative for unexpected weight change.  Musculoskeletal: Positive for arthralgias and back pain.  Skin: Positive for wound.      Objective:   Physical Exam  Patient reported weight at 249 pounds Able to speak in full sentences without any respiratory distress Speach clear    Assessment & Plan:

## 2018-11-19 NOTE — Addendum Note (Signed)
Addended by: Truddie Crumble on: 11/19/2018 02:24 PM   Modules accepted: Orders

## 2018-11-19 NOTE — Assessment & Plan Note (Signed)
This is chronic and stable.  He has a clinical diagnosis of psoriasis and it only occurs at the top of his buttocks.  He uses a topical steroid cream and is asking for a refill.  He only uses it as needed.  Symptoms typically get worse in the winter.  He also uses an over-the-counter [employer] cream when he does not use the steroid cream.  PLAN:  Cont current meds

## 2018-11-19 NOTE — Assessment & Plan Note (Signed)
This problem is chronic and stable.  I am getting A1c's once or twice a year and it is time to do an A1c.  He is going to go to [place] next week to get labs and I am hoping it would be drawn there.  PLAN : A1C

## 2018-11-20 MED ORDER — BETAMETHASONE DIPROPIONATE 0.05 % EX CREA: TOPICAL_CREAM | Freq: Two times a day (BID) | CUTANEOUS | 0 refills | Status: DC

## 2018-11-20 NOTE — Telephone Encounter (Signed)
Done

## 2018-11-23 ENCOUNTER — Telehealth: Payer: Self-pay | Admitting: *Deleted

## 2018-11-23 NOTE — Telephone Encounter (Signed)
Call to [employer] about need for PA for Betamethasone Dipropionate 0.05%.  Patient's insurance will cover Betamethasone Balerate Creme,lotion or ointment 0.1% only.   Message to Dr. Lynnae January to consider a change.   Sander Nephew, RN 11/23/2018 12:23 PM.

## 2018-11-24 ENCOUNTER — Ambulatory Visit
Admission: RE | Admit: 2018-11-24 | Discharge: 2018-11-24 | Disposition: A | Discharge status: Home / Self Care | Payer: Medicaid Other | Source: Ambulatory Visit | Attending: Internal Medicine | Admitting: Internal Medicine

## 2018-11-24 DIAGNOSIS — K7581 Nonalcoholic steatohepatitis (NASH): Secondary | ICD-10-CM

## 2018-11-25 ENCOUNTER — Other Ambulatory Visit: Payer: Self-pay | Admitting: Internal Medicine

## 2018-11-25 ENCOUNTER — Other Ambulatory Visit: Payer: Medicaid Other

## 2018-11-25 DIAGNOSIS — R740 Nonspecific elevation of levels of transaminase and lactic acid dehydrogenase [LDH]: Secondary | ICD-10-CM | POA: Diagnosis not present

## 2018-11-25 DIAGNOSIS — R7401 Elevation of levels of liver transaminase levels: Secondary | ICD-10-CM

## 2018-11-26 LAB — HEPATITIS B SURFACE ANTIGEN: Hepatitis B Surface Ag: NONREACTIVE

## 2018-11-26 LAB — HEPATITIS C ANTIBODY
Hepatitis C Ab: NONREACTIVE
SIGNAL TO CUT-OFF: 0.02 (ref <1.00)

## 2018-11-26 LAB — HEPATITIS B CORE ANTIBODY, TOTAL: Hep B Core Total Ab: NONREACTIVE

## 2018-11-26 LAB — HEPATITIS B SURFACE ANTIBODY, QUANTITATIVE: Hep B S AB Quant (Post): <5 m[IU]/mL — ABNORMAL LOW (ref >=10)

## 2018-11-26 LAB — HEPATITIS A ANTIBODY, TOTAL: Hepatitis A AB,Total: NONREACTIVE

## 2018-11-27 ENCOUNTER — Other Ambulatory Visit: Payer: Self-pay

## 2018-11-27 DIAGNOSIS — R7401 Elevation of levels of liver transaminase levels: Secondary | ICD-10-CM

## 2018-11-27 DIAGNOSIS — K7581 Nonalcoholic steatohepatitis (NASH): Secondary | ICD-10-CM

## 2018-11-27 NOTE — Progress Notes (Signed)
Sheri, Please tell him that no infections seen  He does have fatty liver and I think its both NASH and ASH (alcoholic) and should stop all alcohol and recheck LFT's in 1 month with f/u me soon after that test

## 2018-12-01 ENCOUNTER — Telehealth: Payer: Self-pay | Admitting: Internal Medicine

## 2018-12-01 NOTE — Telephone Encounter (Signed)
Patient returning call to schedule IV for the end of October .  He is scheduled for 01/21/19 2:30

## 2018-12-01 NOTE — Telephone Encounter (Signed)
Pt stated that he is returning your call from Friday.

## 2018-12-16 MED ORDER — BETAMETHASONE VALERATE 0.1 % EX CREA: TOPICAL_CREAM | Freq: Two times a day (BID) | CUTANEOUS | 0 refills | Status: DC

## 2018-12-16 NOTE — Telephone Encounter (Signed)
done

## 2019-01-01 ENCOUNTER — Other Ambulatory Visit: Payer: Self-pay

## 2019-01-01 DIAGNOSIS — G894 Chronic pain syndrome: Secondary | ICD-10-CM

## 2019-01-01 MED ORDER — HYDROCODONE-ACETAMINOPHEN 10-325 MG PO TABS: 1.0000 | ORAL_TABLET | Freq: Four times a day (QID) | ORAL | 0 refills | Status: DC | PRN

## 2019-01-01 NOTE — Telephone Encounter (Signed)
HYDROcodone-acetaminophen (NORCO) 10-325 MG tablet   [facility] @  [id number] [phone](Phone) [phone](Fax)   Pt would like the nurse to call back.

## 2019-01-05 DIAGNOSIS — E669 Obesity, unspecified: Secondary | ICD-10-CM | POA: Diagnosis not present

## 2019-01-05 DIAGNOSIS — K760 Fatty (change of) liver, not elsewhere classified: Secondary | ICD-10-CM | POA: Diagnosis not present

## 2019-01-05 DIAGNOSIS — M25511 Pain in right shoulder: Secondary | ICD-10-CM | POA: Diagnosis not present

## 2019-01-05 DIAGNOSIS — K7581 Nonalcoholic steatohepatitis (NASH): Secondary | ICD-10-CM | POA: Diagnosis not present

## 2019-01-05 DIAGNOSIS — M0589 Other rheumatoid arthritis with rheumatoid factor of multiple sites: Secondary | ICD-10-CM | POA: Diagnosis not present

## 2019-01-05 DIAGNOSIS — M2550 Pain in unspecified joint: Secondary | ICD-10-CM | POA: Diagnosis not present

## 2019-01-05 DIAGNOSIS — Z23 Encounter for immunization: Secondary | ICD-10-CM | POA: Diagnosis not present

## 2019-01-05 DIAGNOSIS — Z7289 Other problems related to lifestyle: Secondary | ICD-10-CM | POA: Diagnosis not present

## 2019-01-05 DIAGNOSIS — Z6834 Body mass index (BMI) 34.0-34.9, adult: Secondary | ICD-10-CM | POA: Diagnosis not present

## 2019-01-05 DIAGNOSIS — M170 Bilateral primary osteoarthritis of knee: Secondary | ICD-10-CM | POA: Diagnosis not present

## 2019-01-21 ENCOUNTER — Ambulatory Visit: Payer: Medicaid Other | Admitting: Internal Medicine

## 2019-01-29 ENCOUNTER — Telehealth: Payer: Self-pay

## 2019-01-29 DIAGNOSIS — G894 Chronic pain syndrome: Secondary | ICD-10-CM

## 2019-01-29 NOTE — Telephone Encounter (Signed)
Needs PA on   HYDROcodone-acetaminophen (NORCO) 10-325 MG tablet.

## 2019-01-29 NOTE — Telephone Encounter (Signed)
PA needed per pharmacist at [facility], please do asap- hydrocodone

## 2019-02-01 ENCOUNTER — Telehealth: Payer: Self-pay | Admitting: *Deleted

## 2019-02-01 NOTE — Telephone Encounter (Addendum)
Information was faxed to [employer] for PA for Hydrocodone Acetaminophen.  Awaiting determination from [employer].   Sander Nephew, RN 02/01/2019 8:55 AM. Call to [employer]rooms PA was approved 02/01/2019 thru 07/31/2019. I [id number]. [place] [id number].  Sander Nephew, RN 01/26/2019 4:16 PM.

## 2019-02-05 ENCOUNTER — Telehealth: Payer: Self-pay | Admitting: Dietician

## 2019-02-05 NOTE — Telephone Encounter (Signed)
Supportive call to Andrew Fowler to get update on current progress of goals. Patient reports a weight of 238# and doing well with his goals. Blood sugars are also doing well. Will call again in January for update.

## 2019-02-22 ENCOUNTER — Other Ambulatory Visit: Payer: Self-pay | Admitting: Internal Medicine

## 2019-02-22 NOTE — Telephone Encounter (Signed)
Needs refill on ALPRAZolam (XANAX) 0.5 MG tablet  ;pt contact [phone]  [id number] [place] [place] [facility] [address]

## 2019-02-24 ENCOUNTER — Telehealth: Payer: Self-pay | Admitting: *Deleted

## 2019-02-24 NOTE — Telephone Encounter (Signed)
Yes that is true.  I have talked to him extensively about PFTs but he is always wanted to delay them.  I would like to document whether he has COPD or not before starting medicines.  Right now, I am just treating the symptom of wheeze.

## 2019-02-24 NOTE — Telephone Encounter (Signed)
[facility] with following statement:  We spoke with your patient about asthma care and noticed your patient has multiple rescue inhaler refills without [facility] in the last 180 days.  We are reaching out on behalf of your patient to determine if it is appropriate to start a daily asthma controller therapy. Please send a new prescription for controller therapy if it is appropriate.

## 2019-03-05 ENCOUNTER — Ambulatory Visit: Payer: Medicaid Other | Admitting: Internal Medicine

## 2019-03-16 ENCOUNTER — Encounter: Payer: Self-pay | Admitting: Internal Medicine

## 2019-03-16 ENCOUNTER — Other Ambulatory Visit (INDEPENDENT_AMBULATORY_CARE_PROVIDER_SITE_OTHER): Payer: Medicaid Other

## 2019-03-16 ENCOUNTER — Ambulatory Visit: Payer: Medicaid Other | Admitting: Internal Medicine

## 2019-03-16 VITALS — BP 98/68 | HR 84 | Temp 98.3°F | Ht 70.0 in | Wt 247.1 lb

## 2019-03-16 DIAGNOSIS — K7581 Nonalcoholic steatohepatitis (NASH): Secondary | ICD-10-CM

## 2019-03-16 LAB — HEPATIC FUNCTION PANEL
ALT: 92 U/L — ABNORMAL HIGH (ref 0–53)
AST: 62 U/L — ABNORMAL HIGH (ref 0–37)
Albumin: 4 g/dL (ref 3.5–5.2)
Alkaline Phosphatase: 439 U/L — ABNORMAL HIGH (ref 39–117)
Bilirubin, Direct: 0.3 mg/dL (ref 0.0–0.3)
Total Bilirubin: 0.8 mg/dL (ref 0.2–1.2)
Total Protein: 8 g/dL (ref 6.0–8.3)

## 2019-03-16 NOTE — Patient Instructions (Signed)
Please go to the lab and I will call results and plans. I will coordinate with Marella Chimes, PA-C also.  I appreciate the opportunity to care for you. Gatha Mayer, MD, [facility]

## 2019-03-16 NOTE — Progress Notes (Signed)
Andrew Fowler 58 y.o. January 09, 1961 [id number]  Assessment & Plan:   Encounter Diagnosis  Name Primary?  Marland Kitchen NASH (nonalcoholic steatohepatitis) Yes    Recheck LFTs.  He has stopped alcohol and they remain elevated.  Consider expanding the work-up he has follow-up in rheumatology coming in I might need to ask that it is done there because he has to use the scat bus and it limits his transportation Copy to Marella Chimes, PA-C Subjective:   Chief Complaint: Abnormal liver tests  HPI Here for follow-up of abnormal liver tests.  He had been on leflunomide and seen by me earlier in the year.  He has fatty liver I told him to stop drinking.  His liver chemistries remain elevated however.  ALT 119 AST 78     No Known Allergies Current Meds  Medication Sig  . ACCU-CHEK SOFTCLIX LANCETS lancets Check blood sugar once a day  . Adalimumab (HUMIRA [place]) Inject 40 mg into the skin once a week.  Marland Kitchen albuterol (VENTOLIN HFA) 108 (90 Base) MCG/ACT inhaler Inhale 1-2 puffs into the lungs every 4 (four) hours as needed. shortness of breath  . ALPRAZolam (XANAX) 0.5 MG tablet TAKE 1 TABLET BY MOUTH TWICE A DAY AS NEEDED FOR ANXIETY  . atorvastatin (LIPITOR) 40 MG tablet Take 1 tablet (40 mg total) by mouth daily.  . betamethasone valerate (VALISONE) 0.1 % cream Apply topically 2 (two) times daily. Stop using when the skin is no longer thick. Do not use on normal skin.  . Blood Glucose Monitoring Suppl (ACCU-CHEK AVIVA PLUS) w/Device KIT Check blood sugar once a day  . glucose blood (ACCU-CHEK AVIVA PLUS) test strip Check blood sugar once a day  . HYDROcodone-acetaminophen (NORCO) 10-325 MG tablet Take 1 tablet by mouth every 6 (six) hours as needed.  Marland Kitchen lisinopril (ZESTRIL) 10 MG tablet Take 1 tablet (10 mg total) by mouth daily.  . metFORMIN (GLUCOPHAGE) 1000 MG tablet TAKE 1 TABLET BY MOUTH EVERY DAY WITH BREAKFAST  . Naloxone HCl (NARCAN) 4 MG/0.1ML LIQD Place 1 spray into the nose once. For overdose.  Dial 9-1-1. Can give another dose 2-3 minutes later if needed. MEDICAID [id number] L  . predniSONE (DELTASONE) 5 MG tablet Take 5 mg by mouth daily.   Past Medical History:  Diagnosis Date  . Abnormal laboratory test result 07/04/2011   Immunofixation electrophoresis 3/13 showed slightly restricted mobility in the IgG and kappa lanes and suggested repeat end of year.   Marland Kitchen Anxiety associated with depression 04/19/2011   On chronic benzos and SSRI's. Gets panic attacks. Does not see mental health.  . Chronic pain syndrome 04/19/2011   Combination of RA, obesity, OA, & DDD. Has seen Dr Sharol Given & s/p R total hip arthroplasty 2/2 OA & ? AVN. s/p L4-L5 laminectomy. Lumbar MRI 4/11 : L3-L4 mod central canal narrowing.  Cervical MRI 6/11 : multi-level DDD and L foraminal stenosis at C4-5 and C5-6.   Marland Kitchen Chronic venous insufficiency 2013   ECHO 2013 was normal. LFT's, creatinine, TSH all normal. Alb a bit low.  Marland Kitchen COPD (chronic obstructive pulmonary disease) ([place])    Per pt, he has a diagnosis of COPD. No PFT's. Uses Alb MDI less than once a month.  . Diabetes mellitus    Non-insulin dependent Type II.  Marland Kitchen Elevated liver function tests 05/17/2011   04/2010. Increased GGT (pt uses ETOH). AMA negative. ABD U/S limited but otherwise negative. Follow Alk phos level. AST & ALT also elevated. False + IgM Hep  B Core Ab. Hep B viral load negative.   Same pattern during hospitalization 2010 : highest alkaline phosphatase was 355, AST 259, and ALT 871. ANA, rheumatoid factor, ceruloplasmin, CMV IgM, alpha antitrypsin, and AMA, all within normal limits.  . Gout    Pt has never had a crystal diagnosis.  . Hepatitis B antibody positive 2013   IgM Hep B core Ab + but Hepatitis B viral load negative.   . Hyperlipidemia    At goal of LDL <100 with statin.  Marland Kitchen Hypertension    ACEI monotherapy  . NASH (nonalcoholic steatohepatitis), ? some EtOH contribution and fibrosis  05/17/2011   Elevated since 05/2008 during hospitalization.  Relatively stable. Extensive W/U and no etiology but likely fatty liver 2/2 obesity despite normal imaging.  Hepatitis - all negative. There was an initial + IgM Hep B core Ab but repeat testing was negative and Hep B viral load was negative.  TTG 06/15/2013  ANA x 2 (05/07/11 and 06/13/2008) ASMA x2 (07/18/2011 negative & weakly + at 21 on 05/27/2011 & nega  . OA (osteoarthritis)   . Obesity, Class III, BMI 40-49.9 (morbid obesity) ([place])   . Obstructive sleep apnea 08/21/08   Sleep study AHI 16.4 with desat to 66%. CPAP of 17 decreased AHI to 0.9. Non-compliant with CPAP.  . RA (rheumatoid arthritis) ([place]) 2013  . Shortness of breath   . Tobacco abuse    Per pt, he has a diagnosis of COPD. Need to locate PFT's.  . Wheelchair bound    Past Surgical History:  Procedure Laterality Date  . LAMINECTOMY  1995   L4-L5  . TONSILLECTOMY    . TOTAL HIP ARTHROPLASTY Left 2009   Social History   Social History Narrative   He is disabled and single without children   Lives with brother and his family in [place]. Nonambulatory at baseline given his RA and weight.  Uses a scooter   Alcohol intermittent up to 10 beers at times though that seems infrequent and most days he does not drink at all   Smoker trying to quit   No smokeless tobacco no drug use   family history includes Diabetes in his father and mother; Heart attack in his mother; Heart disease in his mother; Kidney disease in his father; Obesity in his brother.   Review of Systems As above  Objective:   Physical Exam BP 98/68 (BP Location: Left Arm, Patient Position: Sitting, Cuff Size: Normal)   Pulse 84   Temp 98.3 F (36.8 C)   Ht _0  (1.778 m)   Wt 247 lb 2 oz (112.1 kg)   BMI 35.46 kg/m  Chronically ill in a wheelchair no acute distress

## 2019-03-22 ENCOUNTER — Other Ambulatory Visit: Payer: Self-pay | Admitting: Internal Medicine

## 2019-03-22 DIAGNOSIS — G894 Chronic pain syndrome: Secondary | ICD-10-CM

## 2019-03-22 NOTE — Telephone Encounter (Signed)
Last rx written 03/02/19. Last OV 11/19/18. Next OV telehealth appt scheduled 04/15/19. UDS 10/31/16.

## 2019-03-22 NOTE — Telephone Encounter (Signed)
Needs refill on HYDROcodone-acetaminophen (NORCO) 10-325 MG tablet  [phone]  [id number] [place], [place] - [address]

## 2019-03-23 MED ORDER — HYDROCODONE-ACETAMINOPHEN 10-325 MG PO TABS: 1.0000 | ORAL_TABLET | Freq: Four times a day (QID) | ORAL | 0 refills | Status: DC | PRN

## 2019-03-24 ENCOUNTER — Telehealth: Payer: Self-pay | Admitting: Internal Medicine

## 2019-03-24 NOTE — Telephone Encounter (Signed)
Patient asking for results

## 2019-03-25 ENCOUNTER — Other Ambulatory Visit: Payer: Self-pay | Admitting: Internal Medicine

## 2019-03-25 DIAGNOSIS — R7401 Elevation of levels of liver transaminase levels: Secondary | ICD-10-CM

## 2019-03-25 DIAGNOSIS — R748 Abnormal levels of other serum enzymes: Secondary | ICD-10-CM

## 2019-03-25 NOTE — Telephone Encounter (Signed)
Called patient See result notes

## 2019-03-29 ENCOUNTER — Other Ambulatory Visit: Payer: Self-pay

## 2019-03-29 ENCOUNTER — Telehealth: Payer: Self-pay | Admitting: Internal Medicine

## 2019-03-29 DIAGNOSIS — R7401 Elevation of levels of liver transaminase levels: Secondary | ICD-10-CM

## 2019-03-29 NOTE — Telephone Encounter (Signed)
Patient is encouraged to come for the labs here and not wait for rheumatology next week.

## 2019-04-06 ENCOUNTER — Other Ambulatory Visit: Payer: Self-pay | Admitting: Physician Assistant

## 2019-04-07 ENCOUNTER — Ambulatory Visit (HOSPITAL_COMMUNITY)
Admission: RE | Admit: 2019-04-07 | Discharge: 2019-04-07 | Disposition: A | Discharge status: Home / Self Care | Payer: Medicaid Other | Source: Ambulatory Visit | Attending: Internal Medicine | Admitting: Internal Medicine

## 2019-04-07 ENCOUNTER — Other Ambulatory Visit: Payer: Self-pay

## 2019-04-07 ENCOUNTER — Encounter (HOSPITAL_COMMUNITY): Payer: Self-pay

## 2019-04-07 DIAGNOSIS — I10 Essential (primary) hypertension: Secondary | ICD-10-CM | POA: Insufficient documentation

## 2019-04-07 DIAGNOSIS — M109 Gout, unspecified: Secondary | ICD-10-CM | POA: Diagnosis not present

## 2019-04-07 DIAGNOSIS — G894 Chronic pain syndrome: Secondary | ICD-10-CM | POA: Insufficient documentation

## 2019-04-07 DIAGNOSIS — E785 Hyperlipidemia, unspecified: Secondary | ICD-10-CM | POA: Insufficient documentation

## 2019-04-07 DIAGNOSIS — Z7984 Long term (current) use of oral hypoglycemic drugs: Secondary | ICD-10-CM | POA: Insufficient documentation

## 2019-04-07 DIAGNOSIS — Z79899 Other long term (current) drug therapy: Secondary | ICD-10-CM | POA: Insufficient documentation

## 2019-04-07 DIAGNOSIS — F17210 Nicotine dependence, cigarettes, uncomplicated: Secondary | ICD-10-CM | POA: Insufficient documentation

## 2019-04-07 DIAGNOSIS — F418 Other specified anxiety disorders: Secondary | ICD-10-CM | POA: Insufficient documentation

## 2019-04-07 DIAGNOSIS — J449 Chronic obstructive pulmonary disease, unspecified: Secondary | ICD-10-CM | POA: Diagnosis not present

## 2019-04-07 DIAGNOSIS — R7401 Elevation of levels of liver transaminase levels: Secondary | ICD-10-CM

## 2019-04-07 DIAGNOSIS — Z7952 Long term (current) use of systemic steroids: Secondary | ICD-10-CM | POA: Insufficient documentation

## 2019-04-07 DIAGNOSIS — G4733 Obstructive sleep apnea (adult) (pediatric): Secondary | ICD-10-CM | POA: Insufficient documentation

## 2019-04-07 DIAGNOSIS — Z6841 Body Mass Index (BMI) 40.0 and over, adult: Secondary | ICD-10-CM | POA: Diagnosis not present

## 2019-04-07 DIAGNOSIS — E119 Type 2 diabetes mellitus without complications: Secondary | ICD-10-CM | POA: Insufficient documentation

## 2019-04-07 DIAGNOSIS — M069 Rheumatoid arthritis, unspecified: Secondary | ICD-10-CM | POA: Insufficient documentation

## 2019-04-07 DIAGNOSIS — R7989 Other specified abnormal findings of blood chemistry: Secondary | ICD-10-CM | POA: Insufficient documentation

## 2019-04-07 DIAGNOSIS — R748 Abnormal levels of other serum enzymes: Secondary | ICD-10-CM | POA: Diagnosis not present

## 2019-04-07 LAB — COMPREHENSIVE METABOLIC PANEL
ALT: 100 U/L — ABNORMAL HIGH (ref 0–44)
AST: 68 U/L — ABNORMAL HIGH (ref 15–41)
Albumin: 3.7 g/dL (ref 3.5–5.0)
Alkaline Phosphatase: 392 U/L — ABNORMAL HIGH (ref 38–126)
Anion gap: 10 (ref 5–15)
BUN: 15 mg/dL (ref 6–20)
CO2: 27 mmol/L (ref 22–32)
Calcium: 9.6 mg/dL (ref 8.9–10.3)
Chloride: 97 mmol/L — ABNORMAL LOW (ref 98–111)
Creatinine, Ser: 0.66 mg/dL (ref 0.61–1.24)
GFR calc Af Amer: >60 mL/min (ref >60)
GFR calc non Af Amer: >60 mL/min (ref >60)
Glucose, Bld: 97 mg/dL (ref 70–99)
Potassium: 4 mmol/L (ref 3.5–5.1)
Sodium: 134 mmol/L — ABNORMAL LOW (ref 135–145)
Total Bilirubin: 1 mg/dL (ref 0.3–1.2)
Total Protein: 8 g/dL (ref 6.5–8.1)

## 2019-04-07 LAB — CBC
HCT: 47.9 % (ref 39.0–52.0)
Hemoglobin: 15.9 g/dL (ref 13.0–17.0)
MCH: 31 pg (ref 26.0–34.0)
MCHC: 33.2 g/dL (ref 30.0–36.0)
MCV: 93.4 fL (ref 80.0–100.0)
Platelets: 207 10*3/uL (ref 150–400)
RBC: 5.13 MIL/uL (ref 4.22–5.81)
RDW: 14.5 % (ref 11.5–15.5)
WBC: 8.4 10*3/uL (ref 4.0–10.5)
nRBC: 0 % (ref 0.0–0.2)

## 2019-04-07 LAB — PROTIME-INR
INR: 0.9 (ref 0.8–1.2)
Prothrombin Time: 11.9 seconds (ref 11.4–15.2)

## 2019-04-07 LAB — GLUCOSE, CAPILLARY: Glucose-Capillary: 91 mg/dL (ref 70–99)

## 2019-04-07 MED ORDER — FENTANYL CITRATE (PF) 100 MCG/2ML IJ SOLN
INTRAMUSCULAR | Status: AC | PRN
  Administered 2019-04-07 (×3): 25 ug via INTRAVENOUS

## 2019-04-07 MED ORDER — SODIUM CHLORIDE 0.9 % IV SOLN: INTRAVENOUS | Status: DC

## 2019-04-07 MED ORDER — FENTANYL CITRATE (PF) 100 MCG/2ML IJ SOLN
INTRAMUSCULAR | Status: AC
  Filled 2019-04-07: qty 2

## 2019-04-07 MED ORDER — GELATIN ABSORBABLE 12-7 MM EX MISC
CUTANEOUS | Status: AC
  Filled 2019-04-07: qty 1

## 2019-04-07 MED ORDER — MIDAZOLAM HCL 2 MG/2ML IJ SOLN
INTRAMUSCULAR | Status: AC | PRN
  Administered 2019-04-07 (×2): 1 mg via INTRAVENOUS

## 2019-04-07 MED ORDER — MIDAZOLAM HCL 2 MG/2ML IJ SOLN
INTRAMUSCULAR | Status: AC
  Filled 2019-04-07: qty 4

## 2019-04-07 MED ORDER — LIDOCAINE HCL 1 % IJ SOLN
INTRAMUSCULAR | Status: AC
  Filled 2019-04-07: qty 20

## 2019-04-07 NOTE — H&P (Signed)
Chief Complaint: Patient was seen in consultation today for liver biopsy at the request of Gessner,Carl E  Referring Physician(s): Silvano Rusk E  Supervising Physician: Jacqulynn Cadet  Patient Status: [facility] - Out-pt  History of Present Illness: Andrew Fowler is a 59 y.o. male being worked up for elevated LFTs and fatty liver disease. He is referred for image guided random liver biopsy PMHx, meds, labs, imaging, allergies reviewed. Feels well, no recent fevers, chills, illness. Has been NPO today as directed.   Past Medical History:  Diagnosis Date  . Abnormal laboratory test result 07/04/2011   Immunofixation electrophoresis 3/13 showed slightly restricted mobility in the IgG and kappa lanes and suggested repeat end of year.   Marland Kitchen Anxiety associated with depression 04/19/2011   On chronic benzos and SSRI's. Gets panic attacks. Does not see mental health.  . Chronic pain syndrome 04/19/2011   Combination of RA, obesity, OA, & DDD. Has seen Dr Sharol Given & s/p R total hip arthroplasty 2/2 OA & ? AVN. s/p L4-L5 laminectomy. Lumbar MRI 4/11 : L3-L4 mod central canal narrowing.  Cervical MRI 6/11 : multi-level DDD and L foraminal stenosis at C4-5 and C5-6.   Marland Kitchen Chronic venous insufficiency 2013   ECHO 2013 was normal. LFT's, creatinine, TSH all normal. Alb a bit low.  Marland Kitchen COPD (chronic obstructive pulmonary disease) ([place])    Per pt, he has a diagnosis of COPD. No PFT's. Uses Alb MDI less than once a month.  . Diabetes mellitus    Non-insulin dependent Type II.  Marland Kitchen Elevated liver function tests 05/17/2011   04/2010. Increased GGT (pt uses ETOH). AMA negative. ABD U/S limited but otherwise negative. Follow Alk phos level. AST & ALT also elevated. False + IgM Hep B Core Ab. Hep B viral load negative.   Same pattern during hospitalization 2010 : highest alkaline phosphatase was 355, AST 259, and ALT 871. ANA, rheumatoid factor, ceruloplasmin, CMV IgM, alpha antitrypsin, and AMA, all within normal  limits.  . Gout    Pt has never had a crystal diagnosis.  . Hepatitis B antibody positive 2013   IgM Hep B core Ab + but Hepatitis B viral load negative.   . Hyperlipidemia    At goal of LDL <100 with statin.  Marland Kitchen Hypertension    ACEI monotherapy  . NASH (nonalcoholic steatohepatitis), ? some EtOH contribution and fibrosis  05/17/2011   Elevated since 05/2008 during hospitalization. Relatively stable. Extensive W/U and no etiology but likely fatty liver 2/2 obesity despite normal imaging.  Hepatitis - all negative. There was an initial + IgM Hep B core Ab but repeat testing was negative and Hep B viral load was negative.  TTG 06/15/2013  ANA x 2 (05/07/11 and 06/13/2008) ASMA x2 (07/18/2011 negative & weakly + at 21 on 05/27/2011 & nega  . OA (osteoarthritis)   . Obesity, Class III, BMI 40-49.9 (morbid obesity) ([place])   . Obstructive sleep apnea 08/21/08   Sleep study AHI 16.4 with desat to 66%. CPAP of 17 decreased AHI to 0.9. Non-compliant with CPAP.  . RA (rheumatoid arthritis) ([place]) 2013  . Shortness of breath   . Tobacco abuse    Per pt, he has a diagnosis of COPD. Need to locate PFT's.  . Wheelchair bound     Past Surgical History:  Procedure Laterality Date  . LAMINECTOMY  1995   L4-L5  . TONSILLECTOMY    . TOTAL HIP ARTHROPLASTY Left 2009    Allergies: Patient has no known allergies.  Medications: Prior to Admission medications   Medication Sig Start Date End Date Taking? Authorizing Provider  ACCU-CHEK SOFTCLIX LANCETS lancets Check blood sugar once a day 12/25/17   Bartholomew Crews, MD  Adalimumab (HUMIRA [place]) Inject 40 mg into the skin once a week.    [provider]  albuterol (VENTOLIN HFA) 108 (90 Base) MCG/ACT inhaler Inhale 1-2 puffs into the lungs every 4 (four) hours as needed. shortness of breath 11/19/18   Bartholomew Crews, MD  ALPRAZolam Duanne [place]) 0.5 MG tablet TAKE 1 TABLET BY MOUTH TWICE A DAY AS NEEDED FOR ANXIETY 02/22/19   Bartholomew Crews, MD   atorvastatin (LIPITOR) 40 MG tablet Take 1 tablet (40 mg total) by mouth daily. 11/19/18   Bartholomew Crews, MD  betamethasone valerate (VALISONE) 0.1 % cream Apply topically 2 (two) times daily. Stop using when the skin is no longer thick. Do not use on normal skin. 12/16/18   Bartholomew Crews, MD  Blood Glucose Monitoring Suppl (ACCU-CHEK AVIVA PLUS) w/Device KIT Check blood sugar once a day 04/19/16   Bartholomew Crews, MD  glucose blood (ACCU-CHEK AVIVA PLUS) test strip Check blood sugar once a day 12/25/17   Bartholomew Crews, MD  HYDROcodone-acetaminophen [facility]) 10-325 MG tablet Take 1 tablet by mouth every 6 (six) hours as needed. 03/23/19   Bartholomew Crews, MD  lisinopril (ZESTRIL) 10 MG tablet Take 1 tablet (10 mg total) by mouth daily. 08/27/18   Bartholomew Crews, MD  metFORMIN (GLUCOPHAGE) 1000 MG tablet TAKE 1 TABLET BY MOUTH EVERY DAY WITH BREAKFAST 08/27/18   Bartholomew Crews, MD  Naloxone HCl [facility]) 4 MG/0.1ML LIQD Place 1 spray into the nose once. For overdose. Dial 9-1-1. Can give another dose 2-3 minutes later if needed. MEDICAID [id number] L 05/29/15   Bartholomew Crews, MD  predniSONE (DELTASONE) 5 MG tablet Take 5 mg by mouth daily. 10/13/18   [provider]     Family History  Problem Relation Age of Onset  . Diabetes Mother   . Heart attack Mother   . Heart disease Mother   . Diabetes Father   . Kidney disease Father   . Obesity Brother     Social History   Socioeconomic History  . Marital status: Single    Spouse name: Not on file  . Number of children: 0  . Years of education: Not on file  . Highest education level: Not on file  Occupational History  . Occupation: disabled    Fish farm manager: NOT EMPLOYED  Tobacco Use  . Smoking status: Current Every Day Smoker    Packs/day: 0.50    Years: 10.00    Pack years: 5.00    Types: Cigarettes  . Smokeless tobacco: Never Used  . Tobacco comment: Sometimrs less.  Substance and Sexual  Activity  . Alcohol use: No    Comment: 10 beer every 2-3 months  . Drug use: No    Comment: Prior THC and crakc cocaine use.  10/12/13 7 years clean  . Sexual activity: Not on file  Other Topics Concern  . Not on file  Social History Narrative   He is disabled and single without children   Lives with brother and his family in [place]. Nonambulatory at baseline given his RA and weight.  Uses a scooter   Alcohol intermittent up to 10 beers at times though that seems infrequent and most days he does not drink at all   Smoker trying to quit  No smokeless tobacco no drug use   Social Determinants of Radio broadcast assistant Strain:   . Difficulty of Paying Living Expenses: Not on file  Food Insecurity:   . Worried About Charity fundraiser in the Last Year: Not on file  . Ran Out of Food in the Last Year: Not on file  Transportation Needs:   . Lack of Transportation (Medical): Not on file  . Lack of Transportation (Non-Medical): Not on file  Physical Activity:   . Days of Exercise per Week: Not on file  . Minutes of Exercise per Session: Not on file  Stress:   . Feeling of Stress : Not on file  Social Connections:   . Frequency of Communication with Friends and Family: Not on file  . Frequency of Social Gatherings with Friends and Family: Not on file  . Attends Religious Services: Not on file  . Active Member of Clubs or Organizations: Not on file  . Attends Archivist Meetings: Not on file  . Marital Status: Not on file     Review of Systems: A 12 point ROS discussed and pertinent positives are indicated in the HPI above.  All other systems are negative.  Review of Systems  Vital Signs: BP 116/85 (BP Location: Right Arm)   Pulse 92   Temp 97.8 F (36.6 C) (Oral)   Resp 18   SpO2 94%   Physical Exam Constitutional:      Appearance: Normal appearance. He is obese. He is not ill-appearing.  HENT:     Mouth/Throat:     Mouth: Mucous membranes are  moist.     Pharynx: Oropharynx is clear.  Cardiovascular:     Rate and Rhythm: Normal rate and regular rhythm.     Heart sounds: Normal heart sounds.  Pulmonary:     Effort: Pulmonary effort is normal. No respiratory distress.     Breath sounds: Normal breath sounds.  Skin:    General: Skin is warm and dry.  Neurological:     General: No focal deficit present.     Mental Status: He is alert and oriented to person, place, and time.  Psychiatric:        Mood and Affect: Mood normal.        Thought Content: Thought content normal.        Judgment: Judgment normal.       Imaging: No results found.  Labs:  CBC: Recent Labs    04/07/19 1113  WBC 8.4  HGB 15.9  HCT 47.9  PLT 207    COAGS: Recent Labs    11/12/18 1444  INR 1.0    BMP: No results for input(s): NA, K, CL, CO2, GLUCOSE, BUN, CALCIUM, CREATININE, GFRNONAA, GFRAA in the last 8760 hours.  Invalid input(s): CMP  LIVER FUNCTION TESTS: Recent Labs    11/12/18 1444 03/16/19 1145  BILITOT 0.8 0.8  AST 65* 62*  ALT 115* 92*  ALKPHOS 354* 439*  PROT 8.1 8.0  ALBUMIN 4.1 4.0    TUMOR MARKERS: No results for input(s): AFPTM, CEA, CA199, CHROMGRNA in the last 8760 hours.  Assessment and Plan: Elevated LFTs For image guided random liver bx Labs pending Risks and benefits of liver bx was discussed with the patient and/or patient's family including, but not limited to bleeding, infection, damage to adjacent structures or low yield requiring additional tests.  All of the questions were answered and there is agreement to proceed.  Consent signed and in  chart.    Thank you for this interesting consult.  I greatly enjoyed meeting Andrew Fowler and look forward to participating in their care.  A copy of this report was sent to the requesting provider on this date.  Electronically Signed: Ascencion Dike, PA-C 04/07/2019, 11:42 AM   I spent a total of 20 minutes in face to face in clinical  consultation, greater than 50% of which was counseling/coordinating care for liver bx

## 2019-04-07 NOTE — Discharge Instructions (Signed)
Moderate Conscious Sedation, Adult, Care After These instructions provide you with information about caring for yourself after your procedure. Your health care provider may also give you more specific instructions. Your treatment has been planned according to current medical practices, but problems sometimes occur. Call your health care provider if you have any problems or questions after your procedure. What can I expect after the procedure? After your procedure, it is common:  To feel sleepy for several hours.  To feel clumsy and have poor balance for several hours.  To have poor judgment for several hours.  To vomit if you eat too soon. Follow these instructions at home: For at least 24 hours after the procedure:   Do not: ? Participate in activities where you could fall or become injured. ? Drive. ? Use heavy machinery. ? Drink alcohol. ? Take sleeping pills or medicines that cause drowsiness. ? Make important decisions or sign legal documents. ? Take care of children on your own.  Rest. Eating and drinking  Follow the diet recommended by your health care provider.  If you vomit: ? Drink water, juice, or soup when you can drink without vomiting. ? Make sure you have little or no nausea before eating solid foods. General instructions  Have a responsible adult stay with you until you are awake and alert.  Take over-the-counter and prescription medicines only as told by your health care provider.  If you smoke, do not smoke without supervision.  Keep all follow-up visits as told by your health care provider. This is important. Contact a health care provider if:  You keep feeling nauseous or you keep vomiting.  You feel light-headed.  You develop a rash.  You have a fever. Get help right away if:  You have trouble breathing. This information is not intended to replace advice given to you by your health care provider. Make sure you discuss any questions you have  with your health care provider. Document Revised: 02/21/2017 Document Reviewed: 07/01/2015 Elsevier Patient Education  [place]. Liver Biopsy, Care After These instructions give you information on caring for yourself after your procedure. Your doctor may also give you more specific instructions. Call your doctor if you have any problems or questions after your procedure. What can I expect after the procedure? After the procedure, it is common to have:  Pain and soreness where the biopsy was done.  Bruising around the area where the biopsy was done.  Sleepiness and be tired for a few days. Follow these instructions at home: Medicines  Take over-the-counter and prescription medicines only as told by your doctor.  If you were prescribed an antibiotic medicine, take it as told by your doctor. Do not stop taking the antibiotic even if you start to feel better.  Do not take medicines such as aspirin and ibuprofen. These medicines can thin your blood. Do not take these medicines unless your doctor tells you to take them.  If you are taking prescription pain medicine, take actions to prevent or treat constipation. Your doctor may recommend that you: ? Drink enough fluid to keep your pee (urine) clear or pale yellow. ? Take over-the-counter or prescription medicines. ? Eat foods that are high in fiber, such as fresh fruits and vegetables, whole grains, and beans. ? Limit foods that are high in fat and processed sugars, such as fried and sweet foods. Caring for your cut  Follow instructions from your doctor about how to take care of your cuts from surgery (incisions).  Make sure you: ? Wash your hands with soap and water before you change your bandage (dressing). If you cannot use soap and water, use hand sanitizer. ? Change your bandage as told by your doctor. ? Leave stitches (sutures), skin glue, or skin tape (adhesive) strips in place. They may need to stay in place for 2 weeks or  longer. If tape strips get loose and curl up, you may trim the loose edges. Do not remove tape strips completely unless your doctor says it is okay.  Check your cuts every day for signs of infection. Check for: ? Redness, swelling, or more pain. ? Fluid or blood. ? Pus or a bad smell. ? Warmth.  Do not take baths, swim, or use a hot tub until your doctor says it is okay to do so. Activity   Rest at home for 1-2 days or as told by your doctor. ? Avoid sitting for a long time without moving. Get up to take short walks every 1-2 hours.  Return to your normal activities as told by your doctor. Ask what activities are safe for you.  Do not do these things in the first 24 hours: ? Drive. ? Use machinery. ? Take a bath or shower.  Do not lift more than 10 pounds (4.5 kg) or play contact sports for the first 2 weeks. General instructions   Do not drink alcohol in the first week after the procedure.  Have someone stay with you for at least 24 hours after the procedure.  Get your test results. Ask your doctor or the department that is doing the test: ? When will my results be ready? ? How will I get my results? ? What are my treatment options? ? What other tests do I need? ? What are my next steps?  Keep all follow-up visits as told by your doctor. This is important. Contact a doctor if:  A cut bleeds and leaves more than just a small spot of blood.  A cut is red, puffs up (swells), or hurts more than before.  Fluid or something else comes from a cut.  A cut smells bad.  You have a fever or chills. Get help right away if:  You have swelling, bloating, or pain in your belly (abdomen).  You get dizzy or faint.  You have a rash.  You feel sick to your stomach (nauseous) or throw up (vomit).  You have trouble breathing, feel short of breath, or feel faint.  Your chest hurts.  You have problems talking or seeing.  You have trouble with your balance or moving your  arms or legs. Summary  After the procedure, it is common to have pain, soreness, bruising, and tiredness.  Your doctor will tell you how to take care of yourself at home. Change your bandage, take your medicines, and limit your activities as told by your doctor.  Call your doctor if you have symptoms of infection. Get help right away if your belly swells, your cut bleeds a lot, or you have trouble talking or breathing. This information is not intended to replace advice given to you by your health care provider. Make sure you discuss any questions you have with your health care provider. Document Revised: 03/21/2017 Document Reviewed: 03/21/2017 Elsevier Patient Education  2020 [employer].

## 2019-04-07 NOTE — Procedures (Signed)
Interventional Radiology Procedure Note  Procedure: US guided random liver biopsy  Complications: None  Estimated Blood Loss: None  Recommendations: - DC home after 2 hrs   Signed,  Criselda Peaches, MD

## 2019-04-08 ENCOUNTER — Other Ambulatory Visit: Payer: Self-pay

## 2019-04-12 ENCOUNTER — Telehealth: Payer: Self-pay | Admitting: Internal Medicine

## 2019-04-12 NOTE — Telephone Encounter (Signed)
This RN contacted [facility] who stated they would call the patient and follow up with him concerning the pain per Dr. Celesta Aver request.

## 2019-04-12 NOTE — Telephone Encounter (Signed)
Tell him unusual to still have pain at this point after the liver biopsy  I think if we can ask IR (whichever hospital he had it done) to contact him and explain that to him also - sometimes they need to check for a problem with an ultrasound or xray  But since they did the biopsy best that they talk to him

## 2019-04-12 NOTE — Telephone Encounter (Signed)
FYI-Dr. Carlean Purl Patient very difficult to speak with. Showed a significant amount of hostility over the phone. Reported he didn't understand why he was in so much pain after his test. When asked for more information about the tests he stated, "Well, I thought we were sitting in front of an updated computer and I wouldn't have to tell you what you should already know!" I told the patient that our information was updated but I unfortunately was unfamiliar with his case and a minute to review this chart. He continued to be unkind over the phone. The patient was told that the biopsy results were pending and he would be contacted with the results.

## 2019-04-12 NOTE — Telephone Encounter (Signed)
Patient called IR department today secondary to reports of abdominal pain post random liver biopsy on 1/13.  He states he has been taking ibuprofen for pain but continues to have discomfort around biopsy site with some radiation to the lower abdominal region and rates it as an 8/10. Path pending.   He reports no additional symptoms.  Advised patient to come to ED for further evaluation.

## 2019-04-13 LAB — SURGICAL PATHOLOGY

## 2019-04-15 ENCOUNTER — Encounter: Payer: Medicaid Other | Admitting: Internal Medicine

## 2019-04-15 ENCOUNTER — Telehealth: Payer: Self-pay | Admitting: Physician Assistant

## 2019-04-15 NOTE — Telephone Encounter (Signed)
Patient presented to [facility] radiology today as he states he thought was told to come in and be evaluated due to his previous post procedure liver pain, however he also states he may have got confused because he has several appointments this week. He states he was told on 1/18 to go to the ER if he was having severe pain however he did not do this because he did not want to wait several hours to be seen.   He reports that his pain has improved and he is taking less and less pain medication. He states the pain is worse in the morning and improves as the day goes on - at it's worst it is a 5/10, throughout the day it is around a 3/10. He describes the pain as soreness that starts up near his right arm pit, travel down his flank and occasionally radiates to his right lower abdomen. The biopsy site itself is not sore or bruised. He has not noted any bleeding. He reports that the pain is worsened with coughing when he smokes cigarettes and is improved with rest/pain medication and drinking a lot of fluids (mostly gatorade or pedialyte). He denies any n/v/d. Appetite is appropriate, he is able to perform all ADLs as he did prior to the biopsy.  Abdomen non tender on exam today, no hematoma/ecchymosis/lesion noted to RUQ.   Discussed that he may have developed a small hematoma post procedure which may take several weeks to fully heal and that these can be very painful - offered to set him up for a CT abd w/o contrast today to further evaluate his abdomen, however given his continued improvement with conservative measures it may be best to hold off on further imaging at this time. After thorough discussion today patient reports that he is very comfortable with continuing conservative measures and foregoing any imaging today, he states that feels much better after some reassurance today during our visit. All questions answered, patient encouraged to call our office with any questions or concerns. Reviewed ER return  precautions which patient stated understanding to.  Candiss Norse, PA-C

## 2019-04-16 ENCOUNTER — Other Ambulatory Visit: Payer: Self-pay | Admitting: Internal Medicine

## 2019-04-16 ENCOUNTER — Other Ambulatory Visit: Payer: Self-pay | Admitting: *Deleted

## 2019-04-16 DIAGNOSIS — R7989 Other specified abnormal findings of blood chemistry: Secondary | ICD-10-CM

## 2019-04-16 DIAGNOSIS — K7400 Hepatic fibrosis, unspecified: Secondary | ICD-10-CM

## 2019-04-16 DIAGNOSIS — R1011 Right upper quadrant pain: Secondary | ICD-10-CM

## 2019-04-16 DIAGNOSIS — R945 Abnormal results of liver function studies: Secondary | ICD-10-CM

## 2019-04-19 ENCOUNTER — Ambulatory Visit (INDEPENDENT_AMBULATORY_CARE_PROVIDER_SITE_OTHER): Payer: Medicaid Other | Admitting: Internal Medicine

## 2019-04-19 ENCOUNTER — Other Ambulatory Visit: Payer: Self-pay

## 2019-04-19 DIAGNOSIS — E1169 Type 2 diabetes mellitus with other specified complication: Secondary | ICD-10-CM

## 2019-04-19 DIAGNOSIS — Z79899 Other long term (current) drug therapy: Secondary | ICD-10-CM

## 2019-04-19 DIAGNOSIS — K7581 Nonalcoholic steatohepatitis (NASH): Secondary | ICD-10-CM

## 2019-04-19 DIAGNOSIS — Z0000 Encounter for general adult medical examination without abnormal findings: Secondary | ICD-10-CM

## 2019-04-19 DIAGNOSIS — Z7952 Long term (current) use of systemic steroids: Secondary | ICD-10-CM

## 2019-04-19 DIAGNOSIS — M05719 Rheumatoid arthritis with rheumatoid factor of unspecified shoulder without organ or systems involvement: Secondary | ICD-10-CM

## 2019-04-19 DIAGNOSIS — K7400 Hepatic fibrosis, unspecified: Secondary | ICD-10-CM | POA: Diagnosis not present

## 2019-04-19 DIAGNOSIS — T402X5S Adverse effect of other opioids, sequela: Secondary | ICD-10-CM

## 2019-04-19 DIAGNOSIS — F17200 Nicotine dependence, unspecified, uncomplicated: Secondary | ICD-10-CM

## 2019-04-19 DIAGNOSIS — G479 Sleep disorder, unspecified: Secondary | ICD-10-CM

## 2019-04-19 DIAGNOSIS — F17210 Nicotine dependence, cigarettes, uncomplicated: Secondary | ICD-10-CM

## 2019-04-19 DIAGNOSIS — M069 Rheumatoid arthritis, unspecified: Secondary | ICD-10-CM

## 2019-04-19 DIAGNOSIS — Z7984 Long term (current) use of oral hypoglycemic drugs: Secondary | ICD-10-CM

## 2019-04-19 DIAGNOSIS — K5903 Drug induced constipation: Secondary | ICD-10-CM

## 2019-04-19 DIAGNOSIS — G8929 Other chronic pain: Secondary | ICD-10-CM

## 2019-04-19 DIAGNOSIS — Z79891 Long term (current) use of opiate analgesic: Secondary | ICD-10-CM

## 2019-04-20 ENCOUNTER — Encounter: Payer: Self-pay | Admitting: Internal Medicine

## 2019-04-20 MED ORDER — POLYETHYLENE GLYCOL 3350 17 G PO PACK: 17.0000 g | PACK | Freq: Every day | ORAL | 0 refills | Status: DC | PRN

## 2019-04-20 NOTE — Assessment & Plan Note (Signed)
Insomnia : this is a new problem.  He has had trouble sleeping for a a while now.  He has melatonin and it will work on the first night he takes it but if he takes it for several days in a row, it stops working after the first or second dose.  He has trouble falling asleep.  Sometimes it takes him 2 hours after laying down to doze off.  He feels like his mind will not shut off.  Once he falls asleep, he stays asleep and only gets up once or twice a night to pee and is able to fall right back asleep.  He is not sleepy during the day.  He has tried to nap but does not feel that it helps.  He uses a fan to create white noise in the background.  We discussed that since he is not sleepy during the day that he just needs a little bit of better sleep hygiene.  We discussed that if he sleeps in in the morning, like sometimes he does, that can make it more difficult to fall asleep at night.  If he does not get a good response, I can refer to [facility] for CBT.  Ask about hepatitis B vaccine at his next appointment.

## 2019-04-20 NOTE — Assessment & Plan Note (Signed)
This problem is chronic and uncontrolled.  He is on Humira and prednisone per his rheumatologist.  The leflunomide had to be stopped due to his elevated LFTs and a new medication has not been able to be started until his LFT issues are straightened out.  He is trying to walk every day as much as he can but his back hurts, feeling like is going to break in half.  He has an appointment coming up with his rheumatologist.  PLAN:  Cont current meds

## 2019-04-20 NOTE — Assessment & Plan Note (Signed)
This problem is chronic and uncontrolled.  He currently is on just under 1 pack/day.  He knows he needs to quit, he wants to quit, but he is addicted.  He was able to quit a while back for 7 months but then resumed smoking at 1-1/2 packs/day.  PLAN : follow

## 2019-04-20 NOTE — Progress Notes (Signed)
   Subjective:    Patient ID: [name], male    DOB: Jul 29, 1960, 59 y.o.   MRN: [id number]  HPI  TELEHEALTH - AUDIO only   This is a telephone encounter between [name] and Larey Dresser on 04/20/2019 for chronic pain FU. The visit was conducted with the patient located at home and Larey Dresser at [facility]. The patient's identity was confirmed using their DOB and current address. The patient has consented to being evaluated through a telephone encounter and understands the associated risks (an examination cannot be done and the patient may need to come in for an appointment) / benefits (allows the patient to remain at home, decreasing exposure to coronavirus). I personally spent 25 minutes on medical discussion.   MAXFIELD GILDERSLEEVE is here for chronic pain FU. Please see the A&P for the status of the pt's chronic medical problems.  ROS : per ROS section and in problem oriented charting. All other systems are negative.  PMHx, Soc hx, and / or Fam hx : Still living with a cousin.  In the neighborhood is a very quiet at night.  He is still smoking.   Review of Systems  Constitutional: Negative for fatigue and unexpected weight change.  Gastrointestinal: Negative for abdominal pain.  Musculoskeletal: Positive for arthralgias, back pain and gait problem.  Psychiatric/Behavioral: Positive for sleep disturbance.   Examination not performed as this was a telephone only telehealth appointment    Objective:   Physical Exam        Assessment & Plan:

## 2019-04-20 NOTE — Assessment & Plan Note (Addendum)
This problem is chronic and uncontrolled.  I have reviewed Dr. Celesta Aver notes along with the pathology note.  His liver biopsy ruled out fatty liver or steatohepatitis, iron deposition, a biliary process, a viral hepatitis, or chronic hepatitis.  The most likely etiology based on pathology's opinion was a venous outflow obstruction although a drug-induced liver injury could also show the same pattern of injury.  Dr. Carlean Purl has ordered a CT scan to assess for a Budd-Chiari malformation.  Andrew Fowler has good understanding of the pathology results and next step.  PLAN : F/U CT scan

## 2019-04-20 NOTE — Assessment & Plan Note (Signed)
This problem is chronic and controlled.  He remains on Metformin 1000 QD.  His most recent A1c was 6.5 in June.  He checks his CBG every couple days and they have all been normal except for a random CBG on a rheumatology BMP earlier this year.  I will send in a prescription for an A1c and ask that his rheumatologist add onto her labs when he has an in person appointment.  I will take responsibility for the results of the A1c.  PLAN:  Cont current meds A1C

## 2019-04-20 NOTE — Assessment & Plan Note (Signed)
This problem is chronic and uncontrolled.  His arthritis pain has gotten worse since he had to stop the leflunomide due to his elevated LFTs.  The cold weather also makes his joints to hurt worse.  He is using ice and heat in addition to his chronic hydrocodone and prednisone.  He tries not to take more than 3-1/2 hydrocodone, which would be 35 mg, a day.  He has not taken 4 for at least the past 3 weeks.  He has been concerned about the acetaminophen quantity in relation to his liver.  We discussed that 4 pills is only 1300 mg of acetaminophen which is in the safe level.  He is due for a urine drug screen but he has been trying to stay at home to prevent chance of contracting Covid.  He has had no red flags or orange flag behaviors so I am okay delaying the urine drug screen until a future appointment.  He is having constipation as a side effect to his opioid use.  He uses Metamucil on a regular basis and uses castor oil as needed when his stool is hard.  We discussed laxatives and he agreed to keep it on hand in case he needed it.  I prescribed MiraLAX 17 g daily as needed and sent it to his pharmacy.  PLAN:  Cont current meds PEG PRN

## 2019-04-23 ENCOUNTER — Telehealth: Payer: Self-pay | Admitting: Dietician

## 2019-04-23 NOTE — Telephone Encounter (Signed)
Tried to call to check-in with weight loss and generally see how he is doing lately. Did not answer and no message was left.   [place] Intern

## 2019-05-06 ENCOUNTER — Telehealth: Payer: Self-pay | Admitting: Internal Medicine

## 2019-05-06 ENCOUNTER — Ambulatory Visit (HOSPITAL_COMMUNITY): Payer: Medicaid Other

## 2019-05-06 NOTE — Telephone Encounter (Signed)
Dr. Carlean Purl, [facility] sent me this denial for this patient's imaging. Please advise?

## 2019-05-06 NOTE — Telephone Encounter (Signed)
Dr. Carlean Purl, I have set up the peer to peer for you tomorrow (05/07/19) at 1:30 pm. You will speak to Dr. Nile Dear, please have clinical notes available (their specific request). To make sure you were reached, I provided your cell number as the office numbers are not sometimes not updated in our provided list and I did not want you to miss the call.   Also be advise, for some reason, Dr. Donneta Romberg name is attached to the case. I think somehow Amy Hazelwood did this. I had to speak to a supervisor to find out the provider who was attached to the case after giving your name and being repeatedly refused the request to set up a peer to peer because they assumed I was giving incorrect MD information.

## 2019-05-06 NOTE — Telephone Encounter (Signed)
CT has been denied by [employer]. An appointment for a Peer to Peer can be set up at [phone] Case# [id number].  See reason below:  74[address] 1 CT ABDOMEN; without and with contrast material Denied Based on [employer] Abdomen Imaging Guidelines Section(s): AB 30.1 Abnormal Liver Chemistries and Preface to the Imaging Guidelines, section Preface3 Clinical Information, we cannot approve this request. Your records show that you have a problem in which your liver cells are injured or inflamed and leak a higher than normal level of certain enzymes into your blood stream. The reason this request cannot be approved is because: 1. Guidelines support a recent ultrasound for the clinical indication(s) presented. Ultrasound may help confirm the diagnosis or may help determine the most appropriate next imaging test. The requested procedure might be indicated when recent [address] [place], [place] [address] Fax: [phone] Phone: [phone] Case Request Results - Your Case has been submitted for Further Medical Review Patient Sampson Referring Physician Requested Study / ultrasounds have been performed that are technically limited or non- diagnostic. The clinical information provided does not describe these results and, therefore, the request is not indicated at this time. 2. There is an approval code for the same study, or a similar study, that is on file in the [facility] records. This approval is still in effect. The clinical information submitted does not describe results of this prior imaging, or if there are plans to use this approval. The approved study may be sufficient for the evaluation of the current clinical condition. Guidelines do not support additional imaging without knowing the outcome of the prior approval and, therefore, the request is not indicated at this time. ICD Code Version Description K74.00 10  HEPATIC FIBROSIS, UNSPECIFIED R10.11 10 RIGHT UPPER QUADRANT PAIN R94.5 10 ABNORMAL RESULTS OF LIVER FUNCTION STUDIES

## 2019-05-06 NOTE — Telephone Encounter (Signed)
Please see if they will do a peer-peer w/ me noon-2 PM

## 2019-05-07 NOTE — Telephone Encounter (Signed)
I have obtained authorization for the CT from Dr. Dana Allan  The # is: [id number]  Expiration 11/09/2019

## 2019-05-07 NOTE — Telephone Encounter (Signed)
Called Radiology scheduled and spoke to [facility] who made a notation in the patient's appointment with the authorization number and expiration date given by Dr. Carlean Purl.

## 2019-05-10 NOTE — Telephone Encounter (Signed)
Thanks Brittani! Finished documentation of authorization in Epic.

## 2019-05-10 NOTE — Telephone Encounter (Signed)
Noted  

## 2019-05-11 ENCOUNTER — Ambulatory Visit (HOSPITAL_COMMUNITY): Payer: Medicaid Other

## 2019-05-19 ENCOUNTER — Ambulatory Visit (HOSPITAL_COMMUNITY)
Admission: RE | Admit: 2019-05-19 | Discharge: 2019-05-19 | Disposition: A | Discharge status: Home / Self Care | Payer: Medicaid Other | Source: Ambulatory Visit | Attending: Internal Medicine | Admitting: Internal Medicine

## 2019-05-19 ENCOUNTER — Other Ambulatory Visit: Payer: Self-pay

## 2019-05-19 DIAGNOSIS — K7400 Hepatic fibrosis, unspecified: Secondary | ICD-10-CM | POA: Diagnosis not present

## 2019-05-19 DIAGNOSIS — R945 Abnormal results of liver function studies: Secondary | ICD-10-CM | POA: Insufficient documentation

## 2019-05-19 DIAGNOSIS — R7989 Other specified abnormal findings of blood chemistry: Secondary | ICD-10-CM

## 2019-05-19 DIAGNOSIS — R1011 Right upper quadrant pain: Secondary | ICD-10-CM | POA: Insufficient documentation

## 2019-05-19 DIAGNOSIS — K8020 Calculus of gallbladder without cholecystitis without obstruction: Secondary | ICD-10-CM | POA: Diagnosis not present

## 2019-05-19 MED ORDER — SODIUM CHLORIDE (PF) 0.9 % IJ SOLN
INTRAMUSCULAR | Status: AC
  Filled 2019-05-19: qty 50

## 2019-05-19 MED ORDER — IOHEXOL 300 MG/ML  SOLN
100.0000 mL | Freq: Once | INTRAMUSCULAR | Status: AC | PRN
  Administered 2019-05-19: 100 mL via INTRAVENOUS

## 2019-05-20 ENCOUNTER — Other Ambulatory Visit: Payer: Self-pay | Admitting: *Deleted

## 2019-05-20 DIAGNOSIS — R945 Abnormal results of liver function studies: Secondary | ICD-10-CM

## 2019-05-20 DIAGNOSIS — R7989 Other specified abnormal findings of blood chemistry: Secondary | ICD-10-CM

## 2019-06-02 ENCOUNTER — Telehealth: Payer: Self-pay | Admitting: Internal Medicine

## 2019-06-02 NOTE — Telephone Encounter (Signed)
Pt aware, see result note. He knows to come for labs when he has transportation.

## 2019-06-02 NOTE — Telephone Encounter (Signed)
Pt inquired about CT results.

## 2019-06-08 ENCOUNTER — Other Ambulatory Visit: Payer: Self-pay | Admitting: Internal Medicine

## 2019-06-08 DIAGNOSIS — G894 Chronic pain syndrome: Secondary | ICD-10-CM

## 2019-06-08 MED ORDER — HYDROCODONE-ACETAMINOPHEN 10-325 MG PO TABS: 1.0000 | ORAL_TABLET | Freq: Four times a day (QID) | ORAL | 0 refills | Status: DC | PRN

## 2019-06-08 NOTE — Progress Notes (Signed)
I refilled hydrocodone

## 2019-06-10 ENCOUNTER — Ambulatory Visit: Payer: Medicaid Other | Admitting: Internal Medicine

## 2019-06-17 ENCOUNTER — Other Ambulatory Visit: Payer: Self-pay

## 2019-06-17 ENCOUNTER — Encounter: Payer: Self-pay | Admitting: Internal Medicine

## 2019-06-17 ENCOUNTER — Ambulatory Visit: Payer: Medicaid Other | Admitting: Internal Medicine

## 2019-06-17 DIAGNOSIS — Z0000 Encounter for general adult medical examination without abnormal findings: Secondary | ICD-10-CM

## 2019-06-17 DIAGNOSIS — K7581 Nonalcoholic steatohepatitis (NASH): Secondary | ICD-10-CM

## 2019-06-17 DIAGNOSIS — M05719 Rheumatoid arthritis with rheumatoid factor of unspecified shoulder without organ or systems involvement: Secondary | ICD-10-CM

## 2019-06-17 NOTE — Progress Notes (Signed)
   Subjective:    Patient ID: Andrew Fowler, male    DOB: 1960/10/21, 59 y.o.   MRN: [id number]  HPI  TELEHEALTH - AUDIO only   This is a telephone encounter between Andrew Fowler and Larey Dresser on 06/17/2019 for chronic pain. The visit was conducted with the patient located at home and Larey Dresser at [facility]. The patient's identity was confirmed using their DOB and current address. The patient has consented to being evaluated through a telephone encounter and understands the associated risks (an examination cannot be done and the patient may need to come in for an appointment) / benefits (allows the patient to remain at home, decreasing exposure to coronavirus). I personally spent 6 minutes on medical discussion.   Andrew Fowler is here for chronic pain. Please see the A&P for the status of the pt's chronic medical problems.  ROS : per ROS section and in problem oriented charting. All other systems are negative.  PMHx, Soc hx, and / or Fam hx : Takes SCAT. Rent went up  Review of Systems  Musculoskeletal: Positive for arthralgias, back pain, gait problem and myalgias.  Skin:       3 moles on testicles        Objective:   Physical Exam        Assessment & Plan:

## 2019-06-18 NOTE — Assessment & Plan Note (Signed)
He has a new problem today.  He states he has 3 moles on his testicles that came up a couple months ago.  They are not itchy or painful.  They are minimally raised.  They are about the size of the head of any racer, symmetrical, with some smooth and some jagged borders.  We discussed that it is impossible to know what to do next without any examination.  He wants to come in on April 8 to see me and we can examine the moles at that time.  PLAN : In person examination

## 2019-06-18 NOTE — Assessment & Plan Note (Signed)
This problem is chronic and uncontrolled.  It is managed by his rheumatologist.  Due to elevation in his LFTs, he had to stop his leflunomide about 1 year ago.  He remains on prednisone and Humira along with the opioids I prescribed.  He states his pain is uncontrolled in his joints, particularly in his hands.  He does not want to go up on his opioids and has an appointment next month with his rheumatologist and I encouraged him to discuss whether there were any other medication options that can be used despite his LFT elevation.  PLAN : F/U rheumatology notes

## 2019-06-18 NOTE — Assessment & Plan Note (Signed)
This problem is chronic and uncontrolled.  It is managed by Dr. Carlean Purl.  His liver biopsy in January ruled out fatty liver or steatohepatitis, iron deposition, biliary processes, viral hepatitis, or chronic hepatitis.  A venous outflow obstruction was felt to be the most likely etiology and he recently underwent a CT scan which was not consistent with a venous outflow obstruction.  It suggested cirrhosis.  Andrew Fowler is getting frustrated because he does not understand what is going on with his liver.  He and I discussed that his situation is not clear because the imaging studies are contradicting the pathology results.  He seemed to understand the conflicting study results and was satisfied with my explanation.  Dr. Carlean Purl has requested LFTs and when he comes to see me on April 8, I will get those completed.  PLAN : Repeat LFTs Follow-up Dr. Celesta Aver office notes

## 2019-06-29 ENCOUNTER — Other Ambulatory Visit (INDEPENDENT_AMBULATORY_CARE_PROVIDER_SITE_OTHER): Payer: Medicaid Other

## 2019-06-29 DIAGNOSIS — R945 Abnormal results of liver function studies: Secondary | ICD-10-CM | POA: Diagnosis not present

## 2019-06-29 DIAGNOSIS — R7989 Other specified abnormal findings of blood chemistry: Secondary | ICD-10-CM

## 2019-06-29 LAB — HEPATIC FUNCTION PANEL
ALT: 115 U/L — ABNORMAL HIGH (ref 0–53)
AST: 57 U/L — ABNORMAL HIGH (ref 0–37)
Albumin: 3.6 g/dL (ref 3.5–5.2)
Alkaline Phosphatase: 439 U/L — ABNORMAL HIGH (ref 39–117)
Bilirubin, Direct: 0.2 mg/dL (ref 0.0–0.3)
Total Bilirubin: 0.6 mg/dL (ref 0.2–1.2)
Total Protein: 7.8 g/dL (ref 6.0–8.3)

## 2019-06-29 NOTE — Progress Notes (Signed)
Liver tests are still elevated  Please refer to [facility]  Abnormal LFT's and abnormal liver biopsy, cause not clear

## 2019-07-01 ENCOUNTER — Encounter: Payer: Self-pay | Admitting: Internal Medicine

## 2019-07-01 ENCOUNTER — Ambulatory Visit: Payer: Medicaid Other

## 2019-07-01 ENCOUNTER — Ambulatory Visit: Payer: Medicaid Other | Admitting: Internal Medicine

## 2019-07-01 VITALS — BP 131/71 | HR 94 | Temp 97.7°F | Ht 71.0 in | Wt 251.4 lb

## 2019-07-01 DIAGNOSIS — R7989 Other specified abnormal findings of blood chemistry: Secondary | ICD-10-CM

## 2019-07-01 DIAGNOSIS — Z7984 Long term (current) use of oral hypoglycemic drugs: Secondary | ICD-10-CM

## 2019-07-01 DIAGNOSIS — M069 Rheumatoid arthritis, unspecified: Secondary | ICD-10-CM

## 2019-07-01 DIAGNOSIS — Z79899 Other long term (current) drug therapy: Secondary | ICD-10-CM | POA: Diagnosis not present

## 2019-07-01 DIAGNOSIS — E1169 Type 2 diabetes mellitus with other specified complication: Secondary | ICD-10-CM | POA: Diagnosis not present

## 2019-07-01 DIAGNOSIS — D229 Melanocytic nevi, unspecified: Secondary | ICD-10-CM | POA: Diagnosis not present

## 2019-07-01 DIAGNOSIS — K7581 Nonalcoholic steatohepatitis (NASH): Secondary | ICD-10-CM

## 2019-07-01 DIAGNOSIS — I10 Essential (primary) hypertension: Secondary | ICD-10-CM | POA: Diagnosis not present

## 2019-07-01 DIAGNOSIS — Z0000 Encounter for general adult medical examination without abnormal findings: Secondary | ICD-10-CM

## 2019-07-01 DIAGNOSIS — Z79891 Long term (current) use of opiate analgesic: Secondary | ICD-10-CM

## 2019-07-01 DIAGNOSIS — M05719 Rheumatoid arthritis with rheumatoid factor of unspecified shoulder without organ or systems involvement: Secondary | ICD-10-CM

## 2019-07-01 DIAGNOSIS — R945 Abnormal results of liver function studies: Secondary | ICD-10-CM

## 2019-07-01 LAB — POCT GLYCOSYLATED HEMOGLOBIN (HGB A1C): Hemoglobin A1C: 5.6 % (ref 4.0–5.6)

## 2019-07-01 LAB — GLUCOSE, CAPILLARY: Glucose-Capillary: 109 mg/dL — ABNORMAL HIGH (ref 70–99)

## 2019-07-01 NOTE — Assessment & Plan Note (Signed)
This problem is chronic and uncontrolled.  We spent considerable time reviewing his LFTs.  I showed him his graph of AST, ALT, and alk phos.  We also reviewed the results of his liver biopsy and imaging studies.  The best way I could explain it is that the pathology results and the imaging results are not congruent and therefore the cause of his elevated LFTs is not clear.  Stopping leflunomide has not resulted in normalization of his LFTs.  I reviewed Dr. Celesta Aver notes you indicated a referral to a hepatologist was indicated as his LFTs remain elevated.  I placed a referral as he requested.  PLAN : Hep otology referral

## 2019-07-01 NOTE — Assessment & Plan Note (Signed)
This problem is chronic and stable.  He is on lisinopril 10 mg and has no side effects to this medication.  Blood pressure is at goal.  PLAN:  Cont current meds   BP Readings from Last 3 Encounters:  07/01/19 131/71  04/07/19 132/89  03/16/19 98/68

## 2019-07-01 NOTE — Progress Notes (Signed)
Internal Medicine Clinic Resident   I have personally reviewed this encounter including the documentation in this note and/or discussed this patient with the care management provider. Patient's PCP is an attending and has already reviewed recommendations.   Delice Bison, DO 07/01/2019

## 2019-07-01 NOTE — Assessment & Plan Note (Signed)
He has a new problem today.  The macules on his testicle do not have concerning features today.  We discussed watchful waiting versus referral to dermatology for a skin check and he elected to go to dermatology.  The referral was placed.  PLAN : Dermatology referral

## 2019-07-01 NOTE — Patient Instructions (Signed)
Social Worker Visit Information  Goals we discussed today:  Goals Addressed   None        [name] was given information about Chronic Care Management services today including:  1. CCM service includes personalized support from designated clinical staff supervised by his physician, including individualized plan of care and coordination with other care providers 2. 24/7 contact phone numbers for assistance for urgent and routine care needs. 3. Service will only be billed when office clinical staff spend 20 minutes or more in a month to coordinate care. 4. Only one practitioner may furnish and bill the service in a calendar month. 5. The patient may stop CCM services at any time (effective at the end of the month) by phone call to the office staff. 6. The patient will be responsible for cost sharing (co-pay) of up to 20% of the service fee (after annual deductible is met).  Patient agreed to services and verbal consent obtained.   Patient verbalizes understanding of instructions provided today.   Follow up plan: Appointment scheduled for SW follow up with client by phone on: 07/06/19    Ronn Melena, [place] Coordination Social Worker [place] [phone]

## 2019-07-01 NOTE — Progress Notes (Signed)
   Subjective:    Patient ID: Andrew Fowler, male    DOB: 1961/03/13, 59 y.o.   MRN: [id number]  HPI  Andrew Fowler is here for mole on testicle. Please see the A&P for the status of the pt's chronic medical problems.  ROS : per ROS section and in problem oriented charting. All other systems are negative.  PMHx, Soc hx, and / or Fam hx : During his last telehealth appointment, we discussed some financial restraints but today, Andrew Fowler states he has changed some of his finances and is no longer as big a concern as it was.  Review of Systems  Musculoskeletal: Positive for arthralgias, back pain, gait problem, joint swelling, myalgias and neck pain.  Skin:       Moles on testicle  Psychiatric/Behavioral: Positive for sleep disturbance.       Objective:   Physical Exam Constitutional:      General: He is not in acute distress.    Appearance: Normal appearance. He is obese. He is not ill-appearing, toxic-appearing or diaphoretic.  HENT:     Head: Normocephalic and atraumatic.     Right Ear: External ear normal.     Left Ear: External ear normal.  Eyes:     General: No scleral icterus.       Right eye: No discharge.        Left eye: No discharge.     Comments: Injected sclera bilaterally  Pulmonary:     Effort: Pulmonary effort is normal.  Skin:    Comments: On the front of the testicle (ventral surface) there are 2 separate areas.  The first is a 3 mm hyperpigmented area with smooth rounded borders and even pigmentation.  Next to it, there are 2 81m hyperpigmented areas.  These are slightly raised.  Laterally to it, to the right, there is a separate 3 mm hyperpigmented area, again with even borders, symmetric, and even pigmentation.  Neurological:     General: No focal deficit present.     Mental Status: He is alert. Mental status is at baseline.  Psychiatric:        Mood and Affect: Mood normal.        Behavior: Behavior normal.        Thought Content: Thought content  normal.        Judgment: Judgment normal.       Assessment & Plan:

## 2019-07-01 NOTE — Chronic Care Management (AMB) (Signed)
  Care Management    07/01/2019 Name: Andrew Fowler MRN: [id number] DOB: 1960/07/23  Referred by: Bartholomew Crews, MD Reason for referral : Care Coordination (CCM referral)   Andrew Fowler is a 59 y.o. year old male who is a primary care patient of Bartholomew Crews, MD. The care management team was consulted for assistance with chronic disease management and care coordination needs.   Review of patient status, including review of consultants reports, relevant laboratory and other test results, and collaboration with appropriate care team members and the patient's provider was performed as part of comprehensive patient evaluation and provision of care management services.    SDOH (Social Determinants of Health) assessments performed: No See Care Plan activities for detailed interventions related to SDOH)    Goals Addressed   None     Unable to complete initial Social Work assessment due to patient's transportation arriving.  Did introduce CCM services and patient consented.        Andrew Fowler was given information about Care Management services today including:  1. Care Management services includes personalized support from designated clinical staff supervised by his physician, including individualized plan of care and coordination with other care providers 2. 24/7 contact phone numbers for assistance for urgent and routine care needs. 3. The patient may stop case management services at any time by phone call to the office staff.  Patient agreed to services and verbal consent obtained.    Follow up plan: Telephone follow up appointment with care management team member scheduled for:07/06/19     Ronn Melena, [place] Coordination Social Worker [place] [phone]

## 2019-07-01 NOTE — Patient Instructions (Signed)
1. I will get you into dermatology 2. Let me know what Dr Trudie Reed says about medicines

## 2019-07-01 NOTE — Progress Notes (Signed)
Internal Medicine Clinic Faculty & PCP   I have personally reviewed this encounter including the documentation in this note and/or discussed this patient with the care management provider. I will address any urgent items identified by the care management provider. I was immediatly available for questions / consultation.  Larey Dresser, MD 07/01/2019

## 2019-07-01 NOTE — Assessment & Plan Note (Signed)
This problem is chronic and stable.  His A1c is 5.6.  He remains on Metformin 500 once a day as it can help with weight loss and can help to lower LFTs.  He has no side effects to this medication.  PLAN:  Cont current meds

## 2019-07-01 NOTE — Assessment & Plan Note (Signed)
This problem is chronic and uncontrolled.  Rheumatology manages his RA and he has been off his leflunomide for a year due to elevated LFTs.  He remains on prednisone and Humira.  He states that about 2 weeks after the leflunomide was stopped, his RA became uncontrolled and he now has more frequent and more severe pain flares.  These occur about 4 times a week.  He notices increased pain and stiffness in his hands and he is unable to open a drink or chop onions due to weakness and pain in the hands.  He also has difficulty walking due to right knee pain and effusion which is also affecting his right ankle.  He was unable to give the classic history of increased stiffness in the morning that wears off as the day goes on.  On a typical day, he takes 5 hydrocodone but on a bad day he will take as many as 7.  He is asking for an increase in the quantity of his hydrocodone.  I reviewed his plain films.  He had hand x-rays in 2013 which showed changes consistent with osteoarthritis but not RA.  He has an appointment with rheumatology next week and we discussed his goal of finding a different medication instead of the leflunomide which will control his RA without necessitating increase in his opioids.  PLAN : Continue current hydrocodone dose Follow-up rheumatology notes Consider increase in hydrocodone if he can find no other or a medication that will not affect his LFTs

## 2019-07-06 ENCOUNTER — Ambulatory Visit: Payer: Self-pay

## 2019-07-06 ENCOUNTER — Telehealth: Payer: Medicaid Other

## 2019-07-06 ENCOUNTER — Other Ambulatory Visit: Payer: Self-pay | Admitting: Internal Medicine

## 2019-07-06 DIAGNOSIS — Z4689 Encounter for fitting and adjustment of other specified devices: Secondary | ICD-10-CM

## 2019-07-06 DIAGNOSIS — Z993 Dependence on wheelchair: Secondary | ICD-10-CM

## 2019-07-06 NOTE — Chronic Care Management (AMB) (Signed)
  Chronic Care Management   Outreach Note  07/06/2019 Name: Andrew Fowler MRN: [id number] DOB: 02/14/1961  Referred by: Bartholomew Crews, MD Reason for referral : Care Coordination (SDOH Screening)   An unsuccessful telephone outreach was attempted today. The patient was referred to the case management team for assistance with care management and care coordination.   Follow Up Plan: A HIPPA compliant phone message X 2 was left for the patient providing contact information and requesting a return call.        Ronn Melena, [place] Coordination Social Worker [place] [phone]

## 2019-07-07 DIAGNOSIS — M2550 Pain in unspecified joint: Secondary | ICD-10-CM | POA: Diagnosis not present

## 2019-07-07 DIAGNOSIS — M170 Bilateral primary osteoarthritis of knee: Secondary | ICD-10-CM | POA: Diagnosis not present

## 2019-07-07 DIAGNOSIS — M0589 Other rheumatoid arthritis with rheumatoid factor of multiple sites: Secondary | ICD-10-CM | POA: Diagnosis not present

## 2019-07-07 DIAGNOSIS — K760 Fatty (change of) liver, not elsewhere classified: Secondary | ICD-10-CM | POA: Diagnosis not present

## 2019-07-07 DIAGNOSIS — R7989 Other specified abnormal findings of blood chemistry: Secondary | ICD-10-CM | POA: Diagnosis not present

## 2019-07-14 ENCOUNTER — Ambulatory Visit: Payer: Medicaid Other

## 2019-07-14 ENCOUNTER — Ambulatory Visit: Payer: Medicaid Other | Admitting: *Deleted

## 2019-07-14 DIAGNOSIS — E7800 Pure hypercholesterolemia, unspecified: Secondary | ICD-10-CM

## 2019-07-14 DIAGNOSIS — I10 Essential (primary) hypertension: Secondary | ICD-10-CM

## 2019-07-14 DIAGNOSIS — E1169 Type 2 diabetes mellitus with other specified complication: Secondary | ICD-10-CM

## 2019-07-14 NOTE — Chronic Care Management (AMB) (Signed)
Care Management   Initial Visit Note  07/14/2019 Name: Andrew Fowler MRN: [id number] DOB: 10/02/1960   Objective: Lab Results  Component Value Date   HGBA1C 5.6 07/01/2019   HGBA1C 6.5 09/07/2018   HGBA1C 6.2 (A) 12/25/2017   Lab Results  Component Value Date   MICROALBUR 4.3 (H) 06/09/2014- needs yearly update   LDLCALC 47 05/24/2016   CREATININE 0.66 04/07/2019   BP Readings from Last 3 Encounters:  07/01/19 131/71  04/07/19 132/89  03/16/19 98/68   Lab Results  Component Value Date   CHOL 178 05/24/2016  please update   HDL 66 05/24/2016   LDLCALC 47 05/24/2016   TRIG 324 (H) 05/24/2016   CHOLHDL 2.7 05/24/2016   Assessment: Andrew Fowler is a 59 y.o. year old male who sees Bartholomew Crews, MD for primary care. The care management team was consulted for assistance with care management and care coordination needs related to Financial insecurity, housing and DME needs..   Review of patient status, including review of consultants reports, relevant laboratory and other test results, and collaboration with appropriate care team members and the patient's provider was performed as part of comprehensive patient evaluation and provision of care management services.    SDOH (Social Determinants of Health) assessments performed: No See Care Plan activities for detailed interventions related to [facility])     Outpatient Encounter Medications as of 07/14/2019  Medication Sig Note  . ACCU-CHEK SOFTCLIX LANCETS lancets Check blood sugar once a day   . Adalimumab (HUMIRA [place] [place]) Inject 40 mg into the skin once a week.   Marland Kitchen albuterol (VENTOLIN HFA) 108 (90 Base) MCG/ACT inhaler Inhale 1-2 puffs into the lungs every 4 (four) hours as needed. shortness of breath   . ALPRAZolam (XANAX) 0.5 MG tablet TAKE 1 TABLET BY MOUTH TWICE A DAY AS NEEDED FOR ANXIETY   . atorvastatin (LIPITOR) 40 MG tablet Take 1 tablet (40 mg total) by mouth daily.   . betamethasone valerate (VALISONE) 0.1 % cream  Apply topically 2 (two) times daily. Stop using when the skin is no longer thick. Do not use on normal skin.   . Blood Glucose Monitoring Suppl (ACCU-CHEK AVIVA PLUS) w/Device KIT Check blood sugar once a day   . glucose blood (ACCU-CHEK AVIVA PLUS) test strip Check blood sugar once a day   . HYDROcodone-acetaminophen (NORCO) 10-325 MG tablet Take 1 tablet by mouth every 6 (six) hours as needed.   Marland Kitchen lisinopril (ZESTRIL) 10 MG tablet Take 1 tablet (10 mg total) by mouth daily.   . metFORMIN (GLUCOPHAGE) 1000 MG tablet TAKE 1 TABLET BY MOUTH EVERY DAY WITH BREAKFAST   . Naloxone HCl (NARCAN) 4 MG/0.1ML LIQD Place 1 spray into the nose once. For overdose. Dial 9-1-1. Can give another dose 2-3 minutes later if needed. MEDICAID [id number] L 11/12/2018: On hand   . polyethylene glycol (MIRALAX / GLYCOLAX) 17 g packet Take 17 g by mouth daily as needed. Dissolve 17 g in 120 to 240 mL (4 to 8 ounces) of beverage   . predniSONE (DELTASONE) 5 MG tablet Take 5 mg by mouth daily.    No facility-administered encounter medications on file as of 07/14/2019.    Goals Addressed            This Visit's Progress     Patient Stated   . " I told the social worker I need help with finding a new place to live." (pt-stated)       [place] (  see longitudinal plan of care for additional care plan information)   Current Barriers:  . Chronic Disease Management support, education, and care coordination needs related to HTN, HLD, COPD, DMII, Anxiety, and Rheumatoid Arthritis and Chronic Pain Syndrome  Case Manager Clinical Goal(s):  Marland Kitchen Over the next 30-90 days, patient will work with BSW to address needs related to Financial constraints related to household bills, housing barriers and DME needs in patient with HTN, HLD, COPD, DMII, Anxiety, and Rheumatoid Arthritis and Chronic Pain Syndrome  . Case Manager Clinical Goal(s):   Interventions:  . Collaborated with BSW to initiate plan of care to address needs  related to Financial constraints related to household bills, housing barriers and DME needs in patient with HTN, HLD, COPD, DMII, Anxiety, and Rheumatoid Arthritis and Chronic Pain Syndrome  Patient Self Care Activities:  . Patient verbalizes understanding of plan to work with BSW to address financial insecurity, housing issues and DME needs . Self administers medications as prescribed . Attends all scheduled provider appointments . Calls pharmacy for medication refills . Performs ADL's independently . Performs IADL's independently . Calls provider office for new concerns or questions  Initial goal documentation         Follow up plan:  The care management team will reach out to the patient again over the next 7-14  days.   [name] was given information about Care Management services today including:  1. Care Management services include personalized support from designated clinical staff supervised by a physician, including individualized plan of care and coordination with other care providers 2. 24/7 contact phone numbers for assistance for urgent and routine care needs. 3. The patient may stop Care Management services at any time (effective at the end of the month) by phone call to the office staff.  Patient agreed to services and verbal consent obtained.  Kelli Churn RN, CCM, [facility] RN Care Manager [phone]

## 2019-07-14 NOTE — Chronic Care Management (AMB) (Signed)
Care Management   Follow Up Note   07/14/2019 Name: Andrew Fowler MRN: [id number] DOB: August 30, 1960  Referred by: Bartholomew Crews, MD Reason for referral : Care Coordination (SDOH)   Andrew Fowler is a 59 y.o. year old male who is a primary care patient of Bartholomew Crews, MD. The care management team was consulted for assistance with care management and care coordination needs.    Review of patient status, including review of consultants reports, relevant laboratory and other test results, and collaboration with appropriate care team members and the patient's provider was performed as part of comprehensive patient evaluation and provision of chronic care management services.    SDOH (Social Determinants of Health) assessments performed: Yes See Care Plan activities for detailed interventions related to SDOH)  SDOH Interventions     Most Recent Value  SDOH Interventions  SDOH Interventions for the Following Domains  Housing [provided patient with listing of low income properties in [place] and [employer] application for related entries.   Goals Addressed            This Visit's Progress   . "I need a new cushion for my wheelchair" (pt-stated)       Current Barriers:  Marland Kitchen Knowledge Barriers related to resources available for DME.    Case Manager Clinical Goal(s):  Marland Kitchen Over the next 30 days, patient will work with BSW to address needs related to DME; wheelchair cushion . Over the next 30 days, BSW will collaborate with RN Care Manager to address care management and care coordination needs  Interventions:  . Patient interviewed and appropriate assessments performed . Talked with patient about need for DME.  Patient reports having Printmaker wheelchair that is approximately 58 years old.  Chair is still in great condition but extra cushion is at least 58 years old.  Patient states that a Education officer, museum helped him obtain this  years ago and he did not have to pay anything out of pocket.  . Community message sent to Darlina Guys and Skeet Latch at [facility] to inquire about coverage for this . Collaborated with RN Care Manager and patient to establish an individualized plan of care   Patient Self Care Activities:  . Self administers medications as prescribed . Attends all scheduled provider appointments . Calls provider office for new concerns or questions  Initial goal documentation     . "My brother and I would like to find a place of our own." (pt-stated)       Current Barriers:  Marland Kitchen Knowledge Barriers related to resources available for housing  Case Manager Clinical Goal(s):  Marland Kitchen Over the next 90 days, patient will work with BSW to address needs related to housing . Over the next 90 days, BSW will collaborate with RN Care Manager to address care management and care coordination needs  Interventions:  . Patient interviewed and appropriate assessments performed . Talked with patient about current housing situation.  Patient reports that he and his brother rent a room from their cousin and his wife.  Current housing is stable and safe but patient would like to relocate if able to find an affordable option for he and his brother. Patient acknowledges that it will be difficult for him and brother to relocate due to fixed/limited income.  . Talked with patient about applying for Section 8.  Patient does not wish to apply due to lengthy wait list  and concern about location of Section 8 properties.  . Informed patient about [email or web address] as option to search for housing.  Patient does not have Internet access . Mailed patient list of properties found on [email or web address] . Collaborated with RN Care Manager and patient to establish an individualized plan of care   Patient Self Care Activities:  . Self administers medications as prescribed . Attends all scheduled provider appointments . Calls provider office for new  concerns or questions  Initial goal documentation         The patient has been provided with contact information for the care management team and has been advised to call with any health related questions or concerns.      Ronn Melena, [place] Coordination Social Worker [place] [phone]

## 2019-07-14 NOTE — Patient Instructions (Signed)
Visit Information  Goals Addressed            This Visit's Progress     Patient Stated   . " I told the social worker I need help with finding a new place to live." (pt-stated)       CARE PLAN ENTRY (see longitudinal plan of care for additional care plan information)   Current Barriers:  . Chronic Disease Management support, education, and care coordination needs related to HTN, HLD, COPD, DMII, Anxiety, and Rheumatoid Arthritis and Chronic Pain Syndrome  Case Manager Clinical Goal(s):  Marland Kitchen Over the next 30-90 days, patient will work with BSW to address needs related to Financial constraints related to household bills, housing barriers and DME needs in patient with HTN, HLD, COPD, DMII, Anxiety, and Rheumatoid Arthritis and Chronic Pain Syndrome  . Case Manager Clinical Goal(s):   Interventions:  . Collaborated with BSW to initiate plan of care to address needs related to Financial constraints related to household bills, housing barriers and DME needs in patient with HTN, HLD, COPD, DMII, Anxiety, and Rheumatoid Arthritis and Chronic Pain Syndrome  Patient Self Care Activities:  . Patient verbalizes understanding of plan to work with BSW to address financial insecurity, housing issues and DME needs . Self administers medications as prescribed . Attends all scheduled provider appointments . Calls pharmacy for medication refills . Performs ADL's independently . Performs IADL's independently . Calls provider office for new concerns or questions  Initial goal documentation        Mr. Manheim was given information about Care Management services today including:  1. Care Management services include personalized support from designated clinical staff supervised by his physician, including individualized plan of care and coordination with other care providers 2. 24/7 contact phone numbers for assistance for urgent and routine care needs. 3. The patient may stop CCM services at any  time (effective at the end of the month) by phone call to the office staff.  Patient agreed to services and verbal consent obtained.   The patient verbalized understanding of instructions provided today and declined a print copy of patient instruction materials.   The care management team will reach out to the patient again over the next 7-14 days.   Kelli Churn RN, CCM, [facility] RN Care Manager [phone]

## 2019-07-14 NOTE — Progress Notes (Signed)
Internal Medicine Clinic PCP & Faculty   I have personally reviewed this encounter including the documentation in this note and/or discussed this patient with the care management provider. I will address any urgent items identified by the care management provider.Larey Dresser, MD 07/14/2019

## 2019-07-14 NOTE — Patient Instructions (Signed)
Visit Information  Goals Addressed            This Visit's Progress   . "I need a new cushion for my wheelchair" (pt-stated)       Current Barriers:  Marland Kitchen Knowledge Barriers related to resources available for DME.    Case Manager Clinical Goal(s):  Marland Kitchen Over the next 30 days, patient will work with BSW to address needs related to DME; wheelchair cushion . Over the next 30 days, BSW will collaborate with RN Care Manager to address care management and care coordination needs  Interventions:  . Patient interviewed and appropriate assessments performed . Talked with patient about need for DME.  Patient reports having Printmaker wheelchair that is approximately 58 years old.  Chair is still in great condition but extra cushion is at least 58 years old.  Patient states that a Education officer, museum helped him obtain this years ago and he did not have to pay anything out of pocket.  . Community message sent to Darlina Guys and Skeet Latch at [facility] to inquire about coverage for this . Collaborated with RN Care Manager and patient to establish an individualized plan of care   Patient Self Care Activities:  . Self administers medications as prescribed . Attends all scheduled provider appointments . Calls provider office for new concerns or questions  Initial goal documentation     . "My brother and I would like to find a place of our own." (pt-stated)       Current Barriers:  Marland Kitchen Knowledge Barriers related to resources available for housing  Case Manager Clinical Goal(s):  Marland Kitchen Over the next 90 days, patient will work with BSW to address needs related to housing . Over the next 90 days, BSW will collaborate with RN Care Manager to address care management and care coordination needs  Interventions:  . Patient interviewed and appropriate assessments performed . Talked with patient about current housing situation.  Patient reports that he and his brother rent a room from their cousin and his wife.   Current housing is stable and safe but patient would like to relocate if able to find an affordable option for he and his brother. Patient acknowledges that it will be difficult for him and brother to relocate due to fixed/limited income.  . Talked with patient about applying for Section 8.  Patient does not wish to apply due to lengthy wait list and concern about location of Section 8 properties.  . Informed patient about [email or web address] as option to search for housing.  Patient does not have Internet access . Mailed patient list of properties found on [email or web address] . Collaborated with RN Care Manager and patient to establish an individualized plan of care   Patient Self Care Activities:  . Self administers medications as prescribed . Attends all scheduled provider appointments . Calls provider office for new concerns or questions  Initial goal documentation        Patient verbalizes understanding of instructions provided today.   The patient has been provided with contact information for the care management team and has been advised to call with any health related questions or concerns.      Andrew Fowler, [place] Coordination Social Worker [place] [phone]

## 2019-07-15 NOTE — Progress Notes (Signed)
Internal Medicine Clinic PCP & Faculty  I have personally reviewed this encounter including the documentation in this note and/or discussed this patient with the care management provider. I will address any urgent items identified by the care management provider.  Larey Dresser, MD 07/15/2019

## 2019-07-20 ENCOUNTER — Other Ambulatory Visit: Payer: Self-pay

## 2019-07-20 ENCOUNTER — Telehealth: Payer: Self-pay | Admitting: *Deleted

## 2019-07-20 DIAGNOSIS — G894 Chronic pain syndrome: Secondary | ICD-10-CM

## 2019-07-20 NOTE — Telephone Encounter (Addendum)
Inormation was faxed to [employer] for PA for Oxyconone Acetaminophen.  Awaiting determination within 24 hours.  Sander Nephew, RN 07/20/2019 1:51 PM.  Call to [employer].  PA for Hydrocodone Acetaminophen was approved 4.28.2021 thru 10.25.2021.  [place] [id number]. I [id number].  Patient was called and message was left that medication has been approved.   Sander Nephew, RN 07/21/2019 11:27 AM.

## 2019-07-20 NOTE — Telephone Encounter (Signed)
PLS CONTACT [phone]; INSURANCE NEEDS PRIOR AUTH

## 2019-07-20 NOTE — Telephone Encounter (Signed)
Should have RF until mid June

## 2019-07-20 NOTE — Telephone Encounter (Signed)
HYDROcodone-acetaminophen (NORCO) 10-325 MG tablet, REFILL REQUEST @  [id number] [place] [facility] [phone](Phone) [phone](Fax)

## 2019-07-20 NOTE — Telephone Encounter (Signed)
Last rx written  06/08/19. Last OV 07/01/19. Next OV  Has not been scheduled. UDS  10/31/16.

## 2019-07-21 ENCOUNTER — Telehealth: Payer: Self-pay | Admitting: *Deleted

## 2019-07-21 NOTE — Telephone Encounter (Signed)
Patient called in requesting to be notified when determination is reached regarding PA for oxycodone. Hubbard Hartshorn, BSN, RN-BC

## 2019-07-21 NOTE — Telephone Encounter (Signed)
PA done and sent in as patient had gotten all that had been previously authorized.

## 2019-07-22 ENCOUNTER — Ambulatory Visit: Payer: Medicaid Other | Admitting: *Deleted

## 2019-07-22 DIAGNOSIS — J410 Simple chronic bronchitis: Secondary | ICD-10-CM

## 2019-07-22 DIAGNOSIS — M05719 Rheumatoid arthritis with rheumatoid factor of unspecified shoulder without organ or systems involvement: Secondary | ICD-10-CM

## 2019-07-22 DIAGNOSIS — E1169 Type 2 diabetes mellitus with other specified complication: Secondary | ICD-10-CM

## 2019-07-22 NOTE — Patient Instructions (Signed)
Visit Information It was very nice speaking with you today. Congratulations on your significant weight loss and maintenance of the weight loss and the significant improvements you have made to your health by losing the weight Please contact me or Amber Chrismon BSW if I can be of help in the future.   Goals Addressed            This Visit's Progress     Patient Stated   . COMPLETED: " I told the social worker I need help with finding a new place to live." (pt-stated)       [place] (see longitudinal plan of care for additional care plan information)   Current Barriers:  . Chronic Disease Management support, education, and care coordination needs related to HTN, HLD, COPD, DMII, Anxiety, and Rheumatoid Arthritis and Chronic Pain Syndrome- patient states he is doing well, says Dr Lynnae January and his rheumatologist and gastroenterologist take very good care of him and he does not feel he needs CCM RN services at this time. He says he was motivated and successful in losing a significant amount of weight (and maintaining the weight loss) 4 years ago (despite being morbidly obese since he was 59 years old ) when he was told he had fatty liver disease.He attributes his success to his health care team and his brother for supporting him in his weigh loss journey.   Case Manager Clinical Goal(s):  Marland Kitchen Over the next 30-90 days, patient will work with BSW to address needs related to Financial constraints related to household bills, housing barriers and DME needs in patient with HTN, HLD, COPD, DMII, Anxiety, and Rheumatoid Arthritis and Chronic Pain Syndrome  . Case Manager Clinical Goal(s):   Interventions:  . Collaborated with BSW to initiate plan of care to address needs related to Financial constraints related to household bills, housing barriers and DME needs in patient with HTN, HLD, COPD, DMII, Anxiety, and Rheumatoid Arthritis and Chronic Pain Syndrome . Spoke with patient - no chronic care  management needs identified, congratulated him on his significant weight loss and maintenance of his weight loss . Advise patient to contact this CCM RN or CCM BSW if chronic disease self management needs needs should arise in the future  Patient Self Care Activities:  . Patient verbalizes understanding of plan to work with BSW to address financial insecurity, housing issues and DME needs . Self administers medications as prescribed . Attends all scheduled provider appointments . Calls pharmacy for medication refills . Performs ADL's independently . Performs IADL's independently . Calls provider office for new concerns or questions  Please see past updates related to this goal by clicking on the "Past Updates" button in the selected goal         The patient verbalized understanding of instructions provided today and declined a print copy of patient instruction materials.   The care management team, Amber Chrismon BSW,  will reach out to the patient again over the next 30 days.   Kelli Churn RN, CCM, [facility] RN Care Manager [phone]

## 2019-07-22 NOTE — Progress Notes (Signed)
Internal Medicine Clinic Resident   I have personally reviewed this encounter including the documentation in this note and/or discussed this patient with the care management provider. I will address any urgent items identified by the care management provider and will communicate my actions to the patient's PCP. I have reviewed the patient's CCM visit with my supervising attending.  Vickki Muff, MD 07/22/2019

## 2019-07-22 NOTE — Chronic Care Management (AMB) (Signed)
Care Management   Follow Up Note   07/22/2019 Name: Andrew Fowler MRN: [id number] DOB: [birth year]  Referred by: Bartholomew Crews, MD Reason for referral : Care Coordination (DM, HTN, COPD)   Andrew Fowler is a 59 y.o. year old male who is a primary care patient of Bartholomew Crews, MD. The care management team was consulted for assistance with care management and care coordination needs.    Review of patient status, including review of consultants reports, relevant laboratory and other test results, and collaboration with appropriate care team members and the patient's provider was performed as part of comprehensive patient evaluation and provision of chronic care management services.    SDOH (Social Determinants of Health) assessments performed: No See Care Plan activities for detailed interventions related to [facility])     Advanced Directives: See Care Plan and Vynca application for related entries.   Lab Results  Component Value Date   HGBA1C 5.6 07/01/2019   HGBA1C 6.5 09/07/2018   HGBA1C 6.2 (A) 12/25/2017   Lab Results  Component Value Date   MICROALBUR 4.3 (H) 06/09/2014 needs yearly update   LDLCALC 47 05/24/2016   CREATININE 0.66 04/07/2019   BP Readings from Last 3 Encounters:  07/01/19 131/71  04/07/19 132/89  03/16/19 98/68   Wt Readings from Last 3 Encounters:  07/01/19 251 lb 6.4 oz (114 kg)  03/16/19 247 lb 2 oz (112.1 kg)  11/12/18 251 lb 4 oz (114 kg)    Goals Addressed            This Visit's Progress     Patient Stated   . " I told the social worker I need help with finding a new place to live." (pt-stated)       [place] (see longitudinal plan of care for additional care plan information)   Current Barriers:  . Chronic Disease Management support, education, and care coordination needs related to HTN, HLD, COPD, DMII, Anxiety, and Rheumatoid Arthritis and Chronic Pain Syndrome- patient states he is doing well, says Dr Lynnae January  and his rheumatologist and gastroenterologist take very good care of him and he does not feel he needs CCM RN services at this time. He says he was motivated and successful in losing a significant amount of weight (and maintaining the weight loss) 4 years ago (despite being morbidly obese since he was 59 years old ) when he was told he had fatty liver disease.He attributes his success to his health care team and his brother for supporting him in his weigh loss journey.   Case Manager Clinical Goal(s):  Marland Kitchen Over the next 30-90 days, patient will work with BSW to address needs related to Financial constraints related to household bills, housing barriers and DME needs in patient with HTN, HLD, COPD, DMII, Anxiety, and Rheumatoid Arthritis and Chronic Pain Syndrome  . Case Manager Clinical Goal(s):   Interventions:  . Collaborated with BSW to initiate plan of care to address needs related to Financial constraints related to household bills, housing barriers and DME needs in patient with HTN, HLD, COPD, DMII, Anxiety, and Rheumatoid Arthritis and Chronic Pain Syndrome . Spoke with patient - no chronic care management needs identified, congratulated him on his significant weight loss and maintenance of his weight loss and the significant improvements he has made to his health through his successful weight loss . Advise patient to contact this CCM RN or CCM BSW if chronic disease self management needs should arise in the future  Patient Self Care Activities:  . Patient verbalizes understanding of plan to work with BSW to address financial insecurity, housing issues and DME needs . Self administers medications as prescribed . Attends all scheduled provider appointments . Calls pharmacy for medication refills . Performs ADL's independently . Performs IADL's independently . Calls provider office for new concerns or questions  Please see past updates related to this goal by clicking on the "Past Updates"  button in the selected goal          The patient has been provided with contact information for the care management team and has been advised to call with any health related questions or concerns.  No further follow up from [facility] RN as required as no needs identified    Kelli Churn RN, CCM, [facility] RN Care Manager [phone]

## 2019-07-22 NOTE — Progress Notes (Signed)
Internal Medicine Clinic Attending  CCM services provided by the care management provider and their documentation were discussed with Dr. Shan Levans. We reviewed the pertinent findings, urgent action items addressed by the resident and non-urgent items to be addressed by the PCP.  I agree with the assessment, diagnosis, and plan of care documented in the CCM and resident's note.  Larey Dresser, MD 07/22/2019

## 2019-07-23 ENCOUNTER — Ambulatory Visit: Payer: Self-pay

## 2019-07-23 DIAGNOSIS — I10 Essential (primary) hypertension: Secondary | ICD-10-CM

## 2019-07-23 DIAGNOSIS — E1169 Type 2 diabetes mellitus with other specified complication: Secondary | ICD-10-CM

## 2019-07-23 NOTE — Patient Instructions (Signed)
Visit Information  Goals Addressed            This Visit's Progress   . "I need a new cushion for my wheelchair" (pt-stated)       Current Barriers:  Marland Kitchen Knowledge Barriers related to resources available for DME.    Case Manager Clinical Goal(s):  Marland Kitchen Over the next 30 days, patient will work with BSW to address needs related to DME; wheelchair cushion . Over the next 30 days, BSW will collaborate with RN Care Manager to address care management and care coordination needs  Interventions:  . Patient interviewed and appropriate assessments performed . Collaborated with Darlina Guys with [place] regarding patient's request for new seat cushion for power wheelchair.  Awaiting follow up from her.   Patient Self Care Activities:  . Self administers medications as prescribed . Attends all scheduled provider appointments . Calls provider office for new concerns or questions  Please see past updates related to this goal by clicking on the "Past Updates" button in the selected goal           The patient has been provided with contact information for the care management team and has been advised to call with any health related questions or concerns.     Ronn Melena, [place] Coordination Social Worker [place] [phone]

## 2019-07-23 NOTE — Chronic Care Management (AMB) (Signed)
  Care Management   Follow Up Note   07/23/2019 Name: Andrew Fowler MRN: [id number] DOB: 1960/12/26  Referred by: Bartholomew Crews, MD Reason for referral : Care Coordination (DME)   Andrew Fowler is a 59 y.o. year old male who is a primary care patient of Bartholomew Crews, MD. The care management team was consulted for assistance with care management and care coordination needs.    Review of patient status, including review of consultants reports, relevant laboratory and other test results, and collaboration with appropriate care team members and the patient's provider was performed as part of comprehensive patient evaluation and provision of chronic care management services.    SDOH (Social Determinants of Health) assessments performed: No See Care Plan activities for detailed interventions related to [facility])     Advanced Directives: See Care Plan and Vynca application for related entries.   Goals Addressed            This Visit's Progress   . "I need a new cushion for my wheelchair" (pt-stated)       Current Barriers:  Marland Kitchen Knowledge Barriers related to resources available for DME.    Case Manager Clinical Goal(s):  Marland Kitchen Over the next 30 days, patient will work with BSW to address needs related to DME; wheelchair cushion . Over the next 30 days, BSW will collaborate with RN Care Manager to address care management and care coordination needs  Interventions:  . Patient interviewed and appropriate assessments performed . Collaborated with Darlina Guys with [place] regarding patient's request for new seat cushion for power wheelchair.  Awaiting follow up from her.   Patient Self Care Activities:  . Self administers medications as prescribed . Attends all scheduled provider appointments . Calls provider office for new concerns or questions  Please see past updates related to this goal by clicking on the "Past Updates" button in the selected goal          The  patient has been provided with contact information for the care management team and has been advised to call with any health related questions or concerns.     Ronn Melena, [place] Coordination Social Worker [place] [phone]

## 2019-07-23 NOTE — Progress Notes (Signed)
Internal Medicine Clinic Resident   I have personally reviewed this encounter including the documentation in this note and/or discussed this patient with the care management provider. I will address any urgent items identified by the care management provider and will communicate my actions to the patient's PCP. I have reviewed the patient's CCM visit with my supervising attending.  Vickki Muff, MD 07/23/2019

## 2019-07-24 NOTE — Progress Notes (Signed)
Internal Medicine Clinic Attending  CCM services provided by the care management provider and their documentation were discussed with Dr. Shan Levans. We reviewed the pertinent findings, urgent action items addressed by the resident and non-urgent items to be addressed by the PCP.  I agree with the assessment, diagnosis, and plan of care documented in the CCM and resident's note.  Larey Dresser, MD 07/24/2019

## 2019-07-28 DIAGNOSIS — L820 Inflamed seborrheic keratosis: Secondary | ICD-10-CM | POA: Diagnosis not present

## 2019-07-28 DIAGNOSIS — A630 Anogenital (venereal) warts: Secondary | ICD-10-CM | POA: Diagnosis not present

## 2019-07-30 ENCOUNTER — Ambulatory Visit: Payer: Self-pay

## 2019-07-30 DIAGNOSIS — I10 Essential (primary) hypertension: Secondary | ICD-10-CM

## 2019-07-30 DIAGNOSIS — E1169 Type 2 diabetes mellitus with other specified complication: Secondary | ICD-10-CM

## 2019-07-30 NOTE — Patient Instructions (Signed)
Visit Information  Goals Addressed            This Visit's Progress   . "I need a new cushion for my wheelchair" (pt-stated)       Current Barriers:  Marland Kitchen Knowledge Barriers related to resources available for DME.    Case Manager Clinical Goal(s):  Marland Kitchen Over the next 30 days, patient will work with BSW to address needs related to DME; wheelchair cushion . Over the next 30 days, BSW will collaborate with RN Care Manager to address care management and care coordination needs  Interventions:  . Received the following communication from Andria Rhein with [place].  "The power wheelchair that we provided him with comes with the cushion built into the seat on the captains seat; therefore, an additional cushion cannot be added to the chair.  He is however eligible for a new Power Wheelchair this year since he is Medicaid and they approve for every 4 years.   . Contacted patient to provide above upate.  Per patient, chair is still in good condition so replacing it is not necessary at this time.  . Messaged Ms. Albertina Parr with [place] requesting suggestions for affordable replacement cushion.     Patient Self Care Activities:  . Self administers medications as prescribed . Attends all scheduled provider appointments . Calls provider office for new concerns or questions  Please see past updates related to this goal by clicking on the "Past Updates" button in the selected goal      . COMPLETED: "My brother and I would like to find a place of our own." (pt-stated)       Current Barriers:  Marland Kitchen Knowledge Barriers related to resources available for housing  Case Manager Clinical Goal(s):  Marland Kitchen Over the next 90 days, patient will work with BSW to address needs related to housing . Over the next 90 days, BSW will collaborate with RN Care Manager to address care management and care coordination needs  Interventions:  . Inquired if patient received Social Serve listing of available low income  properties.  Patient did receive list and has contacted many properties.  Patient reports that some are in areas of town in which he does not wish to live.   Per patient, some properties require that your income be three times the amount of rent.   . Discussed senior living options, however, patient states that he will only relocate to a place in which his brother can live with him.  Patient does not meet age requirement for most properties and brother does not meet requirement for any.  . Encouraged patient to continue accessing social serve for update on available properties.    Patient Self Care Activities:  . Self administers medications as prescribed . Attends all scheduled provider appointments . Calls provider office for new concerns or questions  Initial goal documentation        Patient verbalizes understanding of instructions provided today.   The patient has been provided with contact information for the care management team and has been advised to call with any health related questions or concerns.     Ronn Melena, [place] Coordination Social Worker [place] [phone]

## 2019-07-30 NOTE — Chronic Care Management (AMB) (Signed)
Care Management   Follow Up Note   07/30/2019 Name: Andrew Fowler MRN: [id number] DOB: Jan 07, 1961  Referred by: Bartholomew Crews, MD Reason for referral : Care Coordination (DME, Housing resources)   Andrew Fowler is a 59 y.o. year old male who is a primary care patient of Bartholomew Crews, MD. The care management team was consulted for assistance with care management and care coordination needs.    Review of patient status, including review of consultants reports, relevant laboratory and other test results, and collaboration with appropriate care team members and the patient's provider was performed as part of comprehensive patient evaluation and provision of chronic care management services.    SDOH (Social Determinants of Health) assessments performed: No See Care Plan activities for detailed interventions related to [facility])     Advanced Directives: See Care Plan and Vynca application for related entries.   Goals Addressed            This Visit's Progress   . "I need a new cushion for my wheelchair" (pt-stated)       Current Barriers:  Marland Kitchen Knowledge Barriers related to resources available for DME.    Case Manager Clinical Goal(s):  Marland Kitchen Over the next 30 days, patient will work with BSW to address needs related to DME; wheelchair cushion . Over the next 30 days, BSW will collaborate with RN Care Manager to address care management and care coordination needs  Interventions:  . Received the following communication from Andria Rhein with [place].  "The power wheelchair that we provided him with comes with the cushion built into the seat on the captains seat; therefore, an additional cushion cannot be added to the chair.  He is however eligible for a new Power Wheelchair this year since he is Medicaid and they approve for every 4 years.   . Contacted patient to provide above upate.  Per patient, chair is still in good condition so replacing it is not necessary at this  time.  . Messaged Ms. Albertina Parr with [place] requesting suggestions for affordable replacement cushion.     Patient Self Care Activities:  . Self administers medications as prescribed . Attends all scheduled provider appointments . Calls provider office for new concerns or questions  Please see past updates related to this goal by clicking on the "Past Updates" button in the selected goal      . COMPLETED: "My brother and I would like to find a place of our own." (pt-stated)       Current Barriers:  Marland Kitchen Knowledge Barriers related to resources available for housing  Case Manager Clinical Goal(s):  Marland Kitchen Over the next 90 days, patient will work with BSW to address needs related to housing . Over the next 90 days, BSW will collaborate with RN Care Manager to address care management and care coordination needs  Interventions:  . Inquired if patient received Social Serve listing of available low income properties.  Patient did receive list and has contacted many properties.  Patient reports that some are in areas of town in which he does not wish to live.   Per patient, some properties require that your income be three times the amount of rent.   . Discussed senior living options, however, patient states that he will only relocate to a place in which his brother can live with him.  Patient does not meet age requirement for most properties and brother does not meet requirement for any.  . Encouraged patient  to continue accessing social serve for update on available properties.    Patient Self Care Activities:  . Self administers medications as prescribed . Attends all scheduled provider appointments . Calls provider office for new concerns or questions  Initial goal documentation         The patient has been provided with contact information for the care management team and has been advised to call with any health related questions or concerns.    Ronn Melena, [place]  Coordination Social Worker [place] [phone]

## 2019-08-02 ENCOUNTER — Encounter: Payer: Self-pay | Admitting: *Deleted

## 2019-08-02 DIAGNOSIS — R748 Abnormal levels of other serum enzymes: Secondary | ICD-10-CM | POA: Diagnosis not present

## 2019-08-02 DIAGNOSIS — I871 Compression of vein: Secondary | ICD-10-CM | POA: Diagnosis not present

## 2019-08-02 NOTE — Progress Notes (Signed)
Internal Medicine Clinic Attending  CCM services provided by the care management provider and their documentation were discussed with Dr. Maricela Bo. We reviewed the pertinent findings, urgent action items addressed by the resident and non-urgent items to be addressed by the PCP.  I agree with the assessment, diagnosis, and plan of care documented in the CCM and resident's note.  Larey Dresser, MD 08/02/2019

## 2019-08-04 ENCOUNTER — Other Ambulatory Visit (HOSPITAL_COMMUNITY): Payer: Self-pay | Admitting: Nurse Practitioner

## 2019-08-04 ENCOUNTER — Other Ambulatory Visit: Payer: Self-pay | Admitting: Nurse Practitioner

## 2019-08-04 DIAGNOSIS — I272 Pulmonary hypertension, unspecified: Secondary | ICD-10-CM

## 2019-08-04 DIAGNOSIS — K7689 Other specified diseases of liver: Secondary | ICD-10-CM

## 2019-08-09 ENCOUNTER — Ambulatory Visit (HOSPITAL_COMMUNITY): Payer: Medicaid Other

## 2019-08-10 ENCOUNTER — Other Ambulatory Visit: Payer: Medicaid Other

## 2019-08-10 DIAGNOSIS — K769 Liver disease, unspecified: Secondary | ICD-10-CM | POA: Diagnosis not present

## 2019-08-12 DIAGNOSIS — R7401 Elevation of levels of liver transaminase levels: Secondary | ICD-10-CM | POA: Diagnosis not present

## 2019-08-12 DIAGNOSIS — K7581 Nonalcoholic steatohepatitis (NASH): Secondary | ICD-10-CM | POA: Diagnosis not present

## 2019-08-14 ENCOUNTER — Other Ambulatory Visit: Payer: Self-pay | Admitting: Internal Medicine

## 2019-08-16 ENCOUNTER — Other Ambulatory Visit: Payer: Self-pay | Admitting: Internal Medicine

## 2019-08-16 MED ORDER — ALPRAZOLAM 0.5 MG PO TABS: ORAL_TABLET | ORAL | 5 refills | Status: DC

## 2019-08-16 NOTE — Telephone Encounter (Signed)
Need refill on ALPRAZolam (XANAX) 0.5 MG tablet ;pt contact [phone]   [id number] [place] [place] [facility] [address]

## 2019-08-17 ENCOUNTER — Ambulatory Visit (HOSPITAL_COMMUNITY): Payer: Medicaid Other

## 2019-08-18 ENCOUNTER — Telehealth: Payer: Self-pay | Admitting: Internal Medicine

## 2019-08-18 NOTE — Telephone Encounter (Signed)
I called patient to inform him I will be leaving [facility] in June.  He does not have a preference to his new PCP is.  I will refill his medicines in June and have already sent myself a message.

## 2019-08-26 ENCOUNTER — Telehealth: Payer: Self-pay | Admitting: *Deleted

## 2019-08-26 ENCOUNTER — Other Ambulatory Visit: Payer: Self-pay | Admitting: Internal Medicine

## 2019-08-26 ENCOUNTER — Inpatient Hospital Stay: Admission: RE | Admit: 2019-08-26 | Payer: Medicaid Other | Source: Ambulatory Visit

## 2019-08-26 DIAGNOSIS — F1120 Opioid dependence, uncomplicated: Secondary | ICD-10-CM

## 2019-08-26 DIAGNOSIS — G894 Chronic pain syndrome: Secondary | ICD-10-CM

## 2019-08-26 MED ORDER — HYDROCODONE-ACETAMINOPHEN 10-325 MG PO TABS: 1.0000 | ORAL_TABLET | Freq: Four times a day (QID) | ORAL | 0 refills | Status: DC | PRN

## 2019-08-26 MED ORDER — NARCAN 4 MG/0.1ML NA LIQD: 1.0000 | Freq: Once | NASAL | 0 refills | Status: AC

## 2019-08-26 NOTE — Telephone Encounter (Signed)
Patient notified that PCP has refilled narcan in addition to hydrocodone. He states he will be sure to pick this up when he picks up the pain med. Hubbard Hartshorn, BSN, RN-BC

## 2019-08-27 ENCOUNTER — Telehealth: Payer: Medicaid Other

## 2019-08-31 ENCOUNTER — Other Ambulatory Visit: Payer: Self-pay

## 2019-08-31 ENCOUNTER — Ambulatory Visit: Payer: Self-pay

## 2019-08-31 ENCOUNTER — Ambulatory Visit (HOSPITAL_COMMUNITY)
Admission: RE | Admit: 2019-08-31 | Discharge: 2019-08-31 | Disposition: A | Discharge status: Home / Self Care | Payer: Medicaid Other | Source: Ambulatory Visit | Attending: Nurse Practitioner | Admitting: Nurse Practitioner

## 2019-08-31 DIAGNOSIS — K7581 Nonalcoholic steatohepatitis (NASH): Secondary | ICD-10-CM | POA: Insufficient documentation

## 2019-08-31 DIAGNOSIS — E785 Hyperlipidemia, unspecified: Secondary | ICD-10-CM | POA: Diagnosis not present

## 2019-08-31 DIAGNOSIS — J449 Chronic obstructive pulmonary disease, unspecified: Secondary | ICD-10-CM | POA: Diagnosis not present

## 2019-08-31 DIAGNOSIS — I10 Essential (primary) hypertension: Secondary | ICD-10-CM | POA: Diagnosis not present

## 2019-08-31 DIAGNOSIS — I272 Pulmonary hypertension, unspecified: Secondary | ICD-10-CM | POA: Insufficient documentation

## 2019-08-31 DIAGNOSIS — E119 Type 2 diabetes mellitus without complications: Secondary | ICD-10-CM | POA: Diagnosis not present

## 2019-08-31 DIAGNOSIS — F17200 Nicotine dependence, unspecified, uncomplicated: Secondary | ICD-10-CM | POA: Diagnosis not present

## 2019-08-31 DIAGNOSIS — E1169 Type 2 diabetes mellitus with other specified complication: Secondary | ICD-10-CM

## 2019-08-31 NOTE — Chronic Care Management (AMB) (Signed)
  Care Management   Follow Up Note   08/31/2019 Name: Andrew Fowler MRN: [id number] DOB: February 16, 1961  Referred by: Andrew Crews, MD Reason for referral : Care Coordination (DME)   Andrew Fowler is a 59 y.o. year old male who is a primary care patient of Andrew Crews, MD. The care management team was consulted for assistance with care management and care coordination needs.    Review of patient status, including review of consultants reports, relevant laboratory and other test results, and collaboration with appropriate care team members and the patient's provider was performed as part of comprehensive patient evaluation and provision of chronic care management services.    SDOH (Social Determinants of Health) assessments performed: No See Care Plan activities for detailed interventions related to [name])     Advanced Directives: See Care Plan and Vynca application for related entries.   Goals Addressed            This Visit's Progress   . COMPLETED: "I need a new cushion for my wheelchair" (pt-stated)       Current Barriers:  Marland Kitchen Knowledge Barriers related to resources available for DME.    Case Manager Clinical Goal(s):  Marland Kitchen Over the next 30 days, patient will work with BSW to address needs related to DME; wheelchair cushion . Over the next 30 days, BSW will collaborate with RN Care Manager to address care management and care coordination needs  Interventions:  . Closing goal of care plan as cushion is not covered by insurance; buying out of pocket is only option.  Patient is eligible for new power wheelchair but states that he does not need one at this time.    Patient Self Care Activities:  . Self administers medications as prescribed . Attends all scheduled provider appointments . Calls provider office for new concerns or questions  Please see past updates related to this goal by clicking on the "Past Updates" button in the selected goal          The  patient has been provided with contact information for the care management team and has been advised to call with any health related questions or concerns.    Andrew Fowler, [place] Coordination Social Worker [place] [phone]

## 2019-08-31 NOTE — Progress Notes (Signed)
  Echocardiogram 2D Echocardiogram has been performed.  Andrew Fowler 08/31/2019, 11:38 AM

## 2019-08-31 NOTE — Progress Notes (Signed)
Internal Medicine Clinic Resident  I have personally reviewed this encounter including the documentation in this note and/or discussed this patient with the care management provider. I will address any urgent items identified by the care management provider and will communicate my actions to the patient's PCP. I have reviewed the patient's CCM visit with my supervising attending, Dr Heber Hockley.  Asencion Noble, MD 08/31/2019

## 2019-08-31 NOTE — Patient Instructions (Signed)
Visit Information  Goals Addressed            This Visit's Progress   . COMPLETED: "I need a new cushion for my wheelchair" (pt-stated)       Current Barriers:  Marland Kitchen Knowledge Barriers related to resources available for DME.    Case Manager Clinical Goal(s):  Marland Kitchen Over the next 30 days, patient will work with BSW to address needs related to DME; wheelchair cushion . Over the next 30 days, BSW will collaborate with RN Care Manager to address care management and care coordination needs  Interventions:  . Closing goal of care plan as cushion is not covered by insurance; buying out of pocket is only option.  Patient is eligible for new power wheelchair but states that he does not need one at this time.    Patient Self Care Activities:  . Self administers medications as prescribed . Attends all scheduled provider appointments . Calls provider office for new concerns or questions  Please see past updates related to this goal by clicking on the "Past Updates" button in the selected goal           The patient has been provided with contact information for the care management team and has been advised to call with any health related questions or concerns.     Ronn Melena, [place] Coordination Social Worker [place] [phone]

## 2019-09-06 DIAGNOSIS — M2550 Pain in unspecified joint: Secondary | ICD-10-CM | POA: Diagnosis not present

## 2019-09-06 DIAGNOSIS — M1711 Unilateral primary osteoarthritis, right knee: Secondary | ICD-10-CM | POA: Diagnosis not present

## 2019-09-06 DIAGNOSIS — M0589 Other rheumatoid arthritis with rheumatoid factor of multiple sites: Secondary | ICD-10-CM | POA: Diagnosis not present

## 2019-09-06 DIAGNOSIS — K760 Fatty (change of) liver, not elsewhere classified: Secondary | ICD-10-CM | POA: Diagnosis not present

## 2019-09-06 DIAGNOSIS — M170 Bilateral primary osteoarthritis of knee: Secondary | ICD-10-CM | POA: Diagnosis not present

## 2019-09-06 DIAGNOSIS — R7989 Other specified abnormal findings of blood chemistry: Secondary | ICD-10-CM | POA: Diagnosis not present

## 2019-09-09 ENCOUNTER — Other Ambulatory Visit: Payer: Self-pay | Admitting: Internal Medicine

## 2019-09-09 ENCOUNTER — Telehealth: Payer: Self-pay | Admitting: Internal Medicine

## 2019-09-09 NOTE — Telephone Encounter (Signed)
Need refill on metFORMIN (GLUCOPHAGE) 1000 MG tablet  ;pt contact [phone]   [id number] [place] [place]

## 2019-09-10 ENCOUNTER — Other Ambulatory Visit: Payer: Self-pay | Admitting: Internal Medicine

## 2019-09-10 NOTE — Progress Notes (Signed)
Internal Medicine Clinic Attending  CCM services provided by the care management provider and their documentation were discussed with Dr. Sherry Ruffing. We reviewed the pertinent findings, urgent action items addressed by the resident and non-urgent items to be addressed by the PCP.  I agree with the assessment, diagnosis, and plan of care documented in the CCM and resident's note.  Lucious Groves, DO 09/10/2019

## 2019-09-13 ENCOUNTER — Encounter: Payer: Self-pay | Admitting: *Deleted

## 2019-10-12 ENCOUNTER — Other Ambulatory Visit: Payer: Medicaid Other

## 2019-10-19 ENCOUNTER — Ambulatory Visit
Admission: RE | Admit: 2019-10-19 | Discharge: 2019-10-19 | Disposition: A | Discharge status: Home / Self Care | Payer: Medicaid Other | Source: Ambulatory Visit | Attending: Nurse Practitioner | Admitting: Nurse Practitioner

## 2019-10-19 DIAGNOSIS — K7689 Other specified diseases of liver: Secondary | ICD-10-CM

## 2019-10-19 DIAGNOSIS — K7460 Unspecified cirrhosis of liver: Secondary | ICD-10-CM | POA: Diagnosis not present

## 2019-11-17 ENCOUNTER — Encounter: Payer: Self-pay | Admitting: *Deleted

## 2019-11-30 ENCOUNTER — Other Ambulatory Visit: Payer: Self-pay | Admitting: Internal Medicine

## 2019-11-30 DIAGNOSIS — G894 Chronic pain syndrome: Secondary | ICD-10-CM

## 2019-11-30 MED ORDER — HYDROCODONE-ACETAMINOPHEN 10-325 MG PO TABS: 1.0000 | ORAL_TABLET | Freq: Four times a day (QID) | ORAL | 0 refills | Status: DC | PRN

## 2019-11-30 NOTE — Telephone Encounter (Signed)
Last rx written 08/26/19. Last OV  07/01/19. Next OV 12/21/19 UDS 10/31/16.

## 2019-11-30 NOTE — Telephone Encounter (Signed)
Need refill on HYDROcodone-acetaminophen (NORCO) 10-325 MG tablet  ;pt contact [phone]   [id number] [place] [place] [facility] [address] requesting a call back [phone]

## 2019-12-07 ENCOUNTER — Encounter: Payer: Medicaid Other | Admitting: Internal Medicine

## 2019-12-09 DIAGNOSIS — K761 Chronic passive congestion of liver: Secondary | ICD-10-CM | POA: Diagnosis not present

## 2019-12-09 DIAGNOSIS — I871 Compression of vein: Secondary | ICD-10-CM | POA: Diagnosis not present

## 2019-12-09 DIAGNOSIS — R748 Abnormal levels of other serum enzymes: Secondary | ICD-10-CM | POA: Diagnosis not present

## 2019-12-21 ENCOUNTER — Ambulatory Visit: Payer: Medicaid Other | Admitting: Internal Medicine

## 2019-12-21 ENCOUNTER — Other Ambulatory Visit: Payer: Self-pay

## 2019-12-21 ENCOUNTER — Encounter: Payer: Self-pay | Admitting: Internal Medicine

## 2019-12-21 VITALS — BP 124/69 | HR 80 | Temp 98.1°F | Ht 71.0 in

## 2019-12-21 DIAGNOSIS — E7800 Pure hypercholesterolemia, unspecified: Secondary | ICD-10-CM | POA: Diagnosis not present

## 2019-12-21 DIAGNOSIS — Z23 Encounter for immunization: Secondary | ICD-10-CM

## 2019-12-21 DIAGNOSIS — Z0000 Encounter for general adult medical examination without abnormal findings: Secondary | ICD-10-CM

## 2019-12-21 DIAGNOSIS — J410 Simple chronic bronchitis: Secondary | ICD-10-CM

## 2019-12-21 DIAGNOSIS — J449 Chronic obstructive pulmonary disease, unspecified: Secondary | ICD-10-CM | POA: Diagnosis not present

## 2019-12-21 DIAGNOSIS — M05719 Rheumatoid arthritis with rheumatoid factor of unspecified shoulder without organ or systems involvement: Secondary | ICD-10-CM

## 2019-12-21 DIAGNOSIS — E1169 Type 2 diabetes mellitus with other specified complication: Secondary | ICD-10-CM | POA: Diagnosis not present

## 2019-12-21 DIAGNOSIS — F418 Other specified anxiety disorders: Secondary | ICD-10-CM

## 2019-12-21 DIAGNOSIS — I10 Essential (primary) hypertension: Secondary | ICD-10-CM

## 2019-12-21 DIAGNOSIS — E785 Hyperlipidemia, unspecified: Secondary | ICD-10-CM

## 2019-12-21 DIAGNOSIS — G4733 Obstructive sleep apnea (adult) (pediatric): Secondary | ICD-10-CM

## 2019-12-21 DIAGNOSIS — K7581 Nonalcoholic steatohepatitis (NASH): Secondary | ICD-10-CM | POA: Diagnosis not present

## 2019-12-21 DIAGNOSIS — F17210 Nicotine dependence, cigarettes, uncomplicated: Secondary | ICD-10-CM | POA: Diagnosis not present

## 2019-12-21 DIAGNOSIS — M069 Rheumatoid arthritis, unspecified: Secondary | ICD-10-CM | POA: Diagnosis not present

## 2019-12-21 LAB — POCT GLYCOSYLATED HEMOGLOBIN (HGB A1C): Hemoglobin A1C: 5.6 % (ref 4.0–5.6)

## 2019-12-21 LAB — GLUCOSE, CAPILLARY: Glucose-Capillary: 100 mg/dL — ABNORMAL HIGH (ref 70–99)

## 2019-12-21 MED ORDER — POLYETHYLENE GLYCOL 3350 17 G PO PACK: 17.0000 g | PACK | Freq: Every day | ORAL | 0 refills | Status: DC | PRN

## 2019-12-21 NOTE — Assessment & Plan Note (Signed)
-  This problem is chronic and stable -We will continue with Xanax 0.5 mg.attendingattes -Patient's PHQ 9 score is stable at 12 today -We will continue to monitor on current medication -I suspect that he may need initiation of an SSRI as well

## 2019-12-21 NOTE — Assessment & Plan Note (Signed)
-  Patient does have a history of moderate OSA on his sleep study back in 2014. -Patient does report multiple episodes of waking up at night and difficulty falling asleep -States that he is not using his CPAP at home -Given that it has been a while since his last sleep study we will repeat his sleep study this year -I have put in referral to the sleep center for him -I suspect patient will need a CPAP.  Will discuss this with him after the results of the sleep study

## 2019-12-21 NOTE — Assessment & Plan Note (Signed)
-  This problem is chronic and uncontrolled -Patient previously been on simvastatin but was not taking it -He was restarted on pravastatin 40 mg daily -Patient is compliant with this medication -We will recheck his lipid panel today.

## 2019-12-21 NOTE — Assessment & Plan Note (Signed)
-  This problem is chronic and stable -Patient blood pressure is 124/69 today -We will continue with lisinopril 10 mg daily -We will follow up BMP -No further work-up at this time

## 2019-12-21 NOTE — Assessment & Plan Note (Addendum)
-  This problem is chronic and stable -Patient states that he uses albuterol inhaler very infrequently approximately once every couple months -On exam today he was noted to have scattered bilateral wheezes -We will continue with albuterol as needed for now -Patient may benefit from a LAMA inhaler if he continues to have persistent wheezing

## 2019-12-21 NOTE — Assessment & Plan Note (Signed)
-  This problem is chronic and controlled -Patient has been off his leflunomide for over a year secondary to his elevated LFTs -He is on chronic prednisone 5 mg daily as well as Humira -He states that he has recurrent episodes of pain in his joints especially in his hands -He is also on Norco 10/325 mg as needed for his pain -Patient states that his rheumatologist is not taking his insurance anymore.  He wanted a referral to Dr. Patrecia Pour today -I have put this referral in for him -We will continue pain control and current medications for now

## 2019-12-21 NOTE — Patient Instructions (Signed)
-  It was a pleasure meeting you today -We will give you your flu shot today -We will check some blood work including your liver function test, kidney function and your cholesterol level -We will check a foot exam today -I have put in referral for sleep study for possible obstructive sleep apnea -I have refilled your MiraLAX.  -Please call me if you have any questions or concerns or if you need any refills

## 2019-12-21 NOTE — Assessment & Plan Note (Signed)
-  This problem is chronic and uncontrolled -Patient states that he followed up with a hepatologist in [place] and is waiting to hear back from them. -We will recheck his LFTs today -The etiology behind his elevated LFTs remains uncertain at this time.  His biopsy was consistent with likely venous outflow obstruction but CT scan did not show any evidence of this -We will await hepatology follow-up and recommendations

## 2019-12-21 NOTE — Assessment & Plan Note (Signed)
-  This problem is chronic and stable -Patient's A1c is 5.6 today -We will continue Metformin 5 mg once daily for now -Foot exam done today -No further work-up at this time

## 2019-12-21 NOTE — Assessment & Plan Note (Signed)
-  Patient has received both his Covid vaccinations -We will give a flu shot today

## 2019-12-21 NOTE — Progress Notes (Signed)
   Subjective:    Patient ID: Andrew Fowler, male    DOB: [birth year], 60 y.o.   MRN: [id number]  HPI  I have seen and examined this patient.  Patient is here for routine follow-up of his hypertension and diabetes.  Patient states that he feels well currently but he does have intermittent episodes of pain in his hands secondary to his RA.  States that he has cut back on his smoking to 1 pack/day from 2 packs/day.  He also states that he got up with his Covid shots.  Patient states that he is compliant with his medications and denies any other complaints at this time.   Review of Systems  Constitutional: Negative.   HENT: Negative.   Respiratory: Negative.   Cardiovascular: Negative.   Gastrointestinal: Negative.   Musculoskeletal: Positive for arthralgias.  Neurological: Negative.   Psychiatric/Behavioral: Negative.        Objective:   Physical Exam Constitutional:      Appearance: Normal appearance.  HENT:     Head: Normocephalic and atraumatic.  Cardiovascular:     Rate and Rhythm: Normal rate and regular rhythm.     Heart sounds: Normal heart sounds.  Pulmonary:     Breath sounds: Wheezing present.     Comments: Scattered bilateral wheezes noted on exam Abdominal:     General: Bowel sounds are normal. There is no distension.     Palpations: Abdomen is soft.     Tenderness: There is no abdominal tenderness.  Musculoskeletal:        General: No swelling or tenderness.     Cervical back: Neck supple.  Lymphadenopathy:     Cervical: No cervical adenopathy.  Neurological:     Mental Status: He is alert and oriented to person, place, and time.  Psychiatric:        Mood and Affect: Mood normal.        Behavior: Behavior normal.           Assessment & Plan:  Please see problem based charting for assessment and plan:

## 2019-12-22 ENCOUNTER — Telehealth: Payer: Self-pay | Admitting: Internal Medicine

## 2019-12-22 LAB — CMP14 + ANION GAP
ALT: 90 IU/L — ABNORMAL HIGH (ref 0–44)
AST: 60 IU/L — ABNORMAL HIGH (ref 0–40)
Albumin/Globulin Ratio: 1.1 — ABNORMAL LOW (ref 1.2–2.2)
Albumin: 3.9 g/dL (ref 3.8–4.9)
Alkaline Phosphatase: 369 IU/L — ABNORMAL HIGH (ref 44–121)
Anion Gap: 12 mmol/L (ref 10.0–18.0)
BUN/Creatinine Ratio: 22 — ABNORMAL HIGH (ref 9–20)
BUN: 17 mg/dL (ref 6–24)
Bilirubin Total: 0.3 mg/dL (ref 0.0–1.2)
CO2: 25 mmol/L (ref 20–29)
Calcium: 10.1 mg/dL (ref 8.7–10.2)
Chloride: 93 mmol/L — ABNORMAL LOW (ref 96–106)
Creatinine, Ser: 0.78 mg/dL (ref 0.76–1.27)
GFR calc Af Amer: 115 mL/min/{1.73_m2} (ref >59)
GFR calc non Af Amer: 99 mL/min/{1.73_m2} (ref >59)
Globulin, Total: 3.4 g/dL (ref 1.5–4.5)
Glucose: 99 mg/dL (ref 65–99)
Potassium: 4.3 mmol/L (ref 3.5–5.2)
Sodium: 130 mmol/L — ABNORMAL LOW (ref 134–144)
Total Protein: 7.3 g/dL (ref 6.0–8.5)

## 2019-12-22 LAB — LIPID PANEL
Chol/HDL Ratio: 1.9 ratio (ref 0.0–5.0)
Cholesterol, Total: 158 mg/dL (ref 100–199)
HDL: 84 mg/dL (ref >39)
LDL Chol Calc (NIH): 61 mg/dL (ref 0–99)
Triglycerides: 67 mg/dL (ref 0–149)
VLDL Cholesterol Cal: 13 mg/dL (ref 5–40)

## 2019-12-22 NOTE — Telephone Encounter (Signed)
I called the patient to discuss the results of his blood work with him.  Patient was noted to have persistently elevated LFTs (mildly improved from prior).  Patient is following with hepatologist for this and expects to hear back from them next week.  Patient's cholesterol panel shows a much improved triglyceride and VLDL as well as an increase in his HDL and an LDL of 61.  We will continue with pravastatin at current dose.  No further work-up at this time.  Of note, patient did have mild hyponatremia with a sodium of 130.  I am unsure of the etiology of this mild hyponatremia but will monitor closely.  Patient expresses understanding and is in agreement with plan.

## 2019-12-23 ENCOUNTER — Telehealth: Payer: Medicaid Other

## 2019-12-23 ENCOUNTER — Telehealth: Payer: Self-pay

## 2019-12-23 NOTE — Telephone Encounter (Signed)
  Chronic Care Management   Outreach Note  12/23/2019 Name: Andrew Fowler MRN: [id number] DOB: 02/13/1961  Referred by: [name], MD Reason for referral : Care Coordination (DME)   Attempted to contact patient today, per request from Dr. Dareen Piano, to assist with new power wheelchair.    Follow Up Plan: A HIPAA compliant phone message was left for the patient providing contact information and requesting a return call.  Will attempt to reach again next week if no return call.     Ronn Melena, [place] Coordination Social Worker [place] [phone]

## 2019-12-24 ENCOUNTER — Ambulatory Visit: Payer: Medicaid Other

## 2019-12-24 ENCOUNTER — Ambulatory Visit: Payer: Medicaid Other | Admitting: *Deleted

## 2019-12-24 DIAGNOSIS — F418 Other specified anxiety disorders: Secondary | ICD-10-CM

## 2019-12-24 DIAGNOSIS — I10 Essential (primary) hypertension: Secondary | ICD-10-CM

## 2019-12-24 DIAGNOSIS — K7581 Nonalcoholic steatohepatitis (NASH): Secondary | ICD-10-CM

## 2019-12-24 DIAGNOSIS — G4733 Obstructive sleep apnea (adult) (pediatric): Secondary | ICD-10-CM

## 2019-12-24 DIAGNOSIS — J449 Chronic obstructive pulmonary disease, unspecified: Secondary | ICD-10-CM

## 2019-12-24 DIAGNOSIS — E785 Hyperlipidemia, unspecified: Secondary | ICD-10-CM

## 2019-12-24 DIAGNOSIS — E1169 Type 2 diabetes mellitus with other specified complication: Secondary | ICD-10-CM

## 2019-12-24 DIAGNOSIS — M05719 Rheumatoid arthritis with rheumatoid factor of unspecified shoulder without organ or systems involvement: Secondary | ICD-10-CM

## 2019-12-24 DIAGNOSIS — E7800 Pure hypercholesterolemia, unspecified: Secondary | ICD-10-CM

## 2019-12-24 NOTE — Chronic Care Management (AMB) (Signed)
Care Management   Initial Visit Note  12/24/2019 Name: Andrew Fowler MRN: [id number] DOB: 1961-02-11   Assessment: Andrew Fowler is a 59 y.o. year old male who sees Andrew Contes, MD for primary care. The care management team was consulted for assistance with care management and care coordination needs related to DME needs.   Review of patient status, including review of consultants reports, relevant laboratory and other test results, and collaboration with appropriate care team members and the patient's provider was performed as part of comprehensive patient evaluation and provision of care management services.    SDOH (Social Determinants of Health) assessments performed: No See Care Plan activities for detailed interventions related to [facility])     Outpatient Encounter Medications as of 12/24/2019  Medication Sig  . ACCU-CHEK SOFTCLIX LANCETS lancets Check blood sugar once a day  . Adalimumab (HUMIRA Kieler) Inject 40 mg into the skin once a week.  Marland Kitchen albuterol (VENTOLIN HFA) 108 (90 Base) MCG/ACT inhaler Inhale 1-2 puffs into the lungs every 4 (four) hours as needed. shortness of breath  . ALPRAZolam (XANAX) 0.5 MG tablet TAKE 1 TABLET BY MOUTH TWICE A DAY AS NEEDED FOR ANXIETY  . atorvastatin (LIPITOR) 40 MG tablet TAKE 1 TABLET BY MOUTH EVERY DAY  . betamethasone valerate (VALISONE) 0.1 % cream Apply topically 2 (two) times daily. Stop using when the skin is no longer thick. Do not use on normal skin.  . Blood Glucose Monitoring Suppl (ACCU-CHEK AVIVA PLUS) w/Device KIT Check blood sugar once a day  . glucose blood (ACCU-CHEK AVIVA PLUS) test strip Check blood sugar once a day  . HYDROcodone-acetaminophen (NORCO) 10-325 MG tablet Take 1 tablet by mouth every 6 (six) hours as needed.  Marland Kitchen lisinopril (ZESTRIL) 10 MG tablet TAKE 1 TABLET BY MOUTH EVERY DAY  . metFORMIN (GLUCOPHAGE) 1000 MG tablet TAKE 1 TABLET BY MOUTH EVERY DAY WITH BREAKFAST  . polyethylene glycol (MIRALAX / GLYCOLAX)  17 g packet Take 17 g by mouth daily as needed. Dissolve 17 g in 120 to 240 mL (4 to 8 ounces) of beverage  . predniSONE (DELTASONE) 5 MG tablet Take 5 mg by mouth daily.   No facility-administered encounter medications on file as of 12/24/2019.    Goals Addressed              This Visit's Progress     Patient Stated   .  Patient told CCM Andrew Fowler " I need a new wheelchair." (pt-stated)        Andrew Fowler (see longitudinal plan of care for additional care plan information)   Current Barriers:  . Chronic Disease Management support, education, and care coordination needs related to NIDDM, HLD, HTN, COPD, anxiety/depression, OSA NASH, RA  Case Manager Clinical Goal(s):  Marland Kitchen Over the next 90 days, patient will work with Andrew Fowler to address needs related to  DME  in patient with NIDDM, HLD, HTN, COPD, anxiety/depression, OSA NASH, RA  Interventions:  . Collaborated with Andrew Fowler to initiate plan of care to address needs related to  DME  in patient with NIDDM, HLD, HTN, COPD, anxiety/depression, OSA NASH, RA   Patient Self Care Activities:  . Patient verbalizes understanding of plan to work with CCM Andrew Fowler to address request for new wheelchair . Self administers medications as prescribed . Attends all scheduled provider appointments . Calls pharmacy for medication refills . Calls provider office for new concerns or questions  Initial goal documentation  Follow up plan:  The care management team will reach out to the patient again over the next 90 days.   Mr. Fowler was given information about Care Management services today including:  1. Care Management services include personalized support from designated clinical staff supervised by a physician, including individualized plan of care and coordination with other care providers 2. 24/7 contact phone numbers for assistance for urgent and routine care needs. 3. The patient may stop Care Management services at any time (effective at the  end of the month) by phone call to the office staff.  Patient agreed to services and verbal consent obtained.  Andrew Churn RN, CCM, [facility] RN Care Manager [phone]

## 2019-12-24 NOTE — Patient Instructions (Signed)
Visit Information  Goals Addressed              This Visit's Progress     Patient Stated   .  Patient told CCM BSW " I need a new wheelchair." (pt-stated)        Andrew Fowler (see longitudinal plan of care for additional care plan information)   Current Barriers:  . Chronic Disease Management support, education, and care coordination needs related to NIDDM, HLD, HTN, COPD, anxiety/depression, OSA NASH, RA  Case Manager Clinical Goal(s):  Marland Kitchen Over the next 90 days, patient will work with BSW to address needs related to  DME  in patient with NIDDM, HLD, HTN, COPD, anxiety/depression, OSA NASH, RA  Interventions:  . Collaborated with BSW to initiate plan of care to address needs related to  DME  in patient with NIDDM, HLD, HTN, COPD, anxiety/depression, OSA NASH, RA   Patient Self Care Activities:  . Patient verbalizes understanding of plan to work with CCM BSW to address request for new wheelchair . Self administers medications as prescribed . Attends all scheduled provider appointments . Calls pharmacy for medication refills . Calls provider office for new concerns or questions  Initial goal documentation        Andrew Fowler was given information about Care Management services today including:  1. Care Management services include personalized support from designated clinical staff supervised by his physician, including individualized plan of care and coordination with other care providers 2. 24/7 contact phone numbers for assistance for urgent and routine care needs. 3. The patient may stop CCM services at any time (effective at the end of the month) by phone call to the office staff.  Patient agreed to services and verbal consent obtained.   The patient verbalized understanding of instructions provided today and declined a print copy of patient instruction materials.   The care management team will reach out to the patient again over the next 90 days.   Andrew Churn  RN, CCM, [facility] RN Care Manager [phone]

## 2019-12-24 NOTE — Progress Notes (Addendum)
Internal Medicine Clinic Resident  I have personally reviewed this encounter including the documentation in this note and/or discussed this patient with the care management provider. I will address any urgent items identified by the care management provider and will communicate my actions to the patient's PCP. I have reviewed the patient's CCM visit with my supervising attending, Dr Jimmye Norman.  Harvie Heck, MD  IMTS PGY-2 12/24/2019   DOS 12/24/2019 Internal Medicine Attending: I reviewed this case and agree with documentation. Dorian Pod, MD

## 2019-12-24 NOTE — Chronic Care Management (AMB) (Signed)
  Care Management   Follow Up Note   12/24/2019 Name: Andrew Fowler MRN: [id number] DOB: December 07, 1960  Referred by: [name], MD Reason for referral : Care Coordination (DME )   Andrew Fowler is a 59 y.o. year old male who is a primary care patient of [name], MD. The care management team was consulted for assistance with care management and care coordination needs.    Review of patient status, including review of consultants reports, relevant laboratory and other test results, and collaboration with appropriate care team members and the patient's provider was performed as part of comprehensive patient evaluation and provision of chronic care management services.    SDOH (Social Determinants of Health) assessments performed: No See Care Plan activities for detailed interventions related to Andrew Fowler)     Advanced Directives: See Care Plan and Vynca application for related entries.   Goals Addressed              This Visit's Progress   .  "I need a new wheelchair" (pt-stated)        Current Barriers:  . Patient requesting assistance with obtaining new power wheelchair, however, he is unsure of exactly how long he's had current chair and whether he is eligible for replacement.   Case Manager Clinical Goal(s):  Marland Kitchen Over the next 90 days, patient will work with BSW to address needs related to DME . Over the next 90 days, BSW will collaborate with RN Care Manager to address care management and care coordination needs  Interventions:  . Patient interviewed and appropriate assessments performed . [place] team to determine if they were original supplier of patient's wheelchair and if he is eligible for new chair.  Marland Kitchen Received response from Andria Rhein that it appears patient is eligible for new wheelchair as of this month but she will verify that next week.  Nash Dimmer with RN Care Manager and patient to establish an individualized plan of care   .  Patient Self Care Activities:  . Self administers medications as prescribed . Attends all scheduled provider appointments . Calls pharmacy for medication refills . Calls provider office for new concerns or questions  Initial goal documentation         Will follow up with patient when response is received from [place].      Andrew Fowler, [place] Coordination Social Worker [place] [phone]

## 2019-12-24 NOTE — Patient Instructions (Signed)
Visit Information  Goals Addressed              This Visit's Progress   .  "I need a new wheelchair" (pt-stated)        Current Barriers:  . Patient requesting assistance with obtaining new power wheelchair, however, he is unsure of exactly how long he's had current chair and whether he is eligible for replacement.   Case Manager Clinical Goal(s):  Marland Kitchen Over the next 90 days, patient will work with BSW to address needs related to DME . Over the next 90 days, BSW will collaborate with RN Care Manager to address care management and care coordination needs  Interventions:  . Patient interviewed and appropriate assessments performed . [place] team to determine if they were original supplier of patient's wheelchair and if he is eligible for new chair.  Marland Kitchen Received response from Andria Rhein that it appears patient is eligible for new wheelchair as of this month but she will verify that next week.  Nash Dimmer with RN Care Manager and patient to establish an individualized plan of care  .  Patient Self Care Activities:  . Self administers medications as prescribed . Attends all scheduled provider appointments . Calls pharmacy for medication refills . Calls provider office for new concerns or questions  Initial goal documentation        Patient verbalizes understanding of instructions provided today.   Will follow up with patient when response is received from [place], [place] Worker [place] [phone]

## 2019-12-24 NOTE — Progress Notes (Signed)
Internal Medicine Clinic Resident  I have personally reviewed this encounter including the documentation in this note and/or discussed this patient with the care management provider. I will address any urgent items identified by the care management provider and will communicate my actions to the patient's PCP. I have reviewed the patient's CCM visit with my supervising attending, Dr Dareen Piano.  Harvie Heck, MD  IMTS PGY-2 12/24/2019

## 2019-12-27 ENCOUNTER — Ambulatory Visit: Payer: Medicaid Other

## 2019-12-27 DIAGNOSIS — E1169 Type 2 diabetes mellitus with other specified complication: Secondary | ICD-10-CM

## 2019-12-27 DIAGNOSIS — M05719 Rheumatoid arthritis with rheumatoid factor of unspecified shoulder without organ or systems involvement: Secondary | ICD-10-CM

## 2019-12-27 DIAGNOSIS — K7581 Nonalcoholic steatohepatitis (NASH): Secondary | ICD-10-CM

## 2019-12-27 NOTE — Progress Notes (Signed)
Internal Medicine Clinic Resident  I have personally reviewed this encounter including the documentation in this note and/or discussed this patient with the care management provider. I will address any urgent items identified by the care management provider and will communicate my actions to the patient's PCP. I have reviewed the patient's CCM visit with my supervising attending, Dr Daryll Drown.  Harvie Heck, MD  IMTS PGY-2 12/27/2019

## 2019-12-27 NOTE — Patient Instructions (Signed)
Visit Information  Goals Addressed              This Visit's Progress   .  "I need a new wheelchair" (pt-stated)        Current Barriers:  . Patient requesting assistance with obtaining new power wheelchair, however, he is unsure of exactly how long he's had current chair and whether he is eligible for replacement.   Case Manager Clinical Goal(s):  Marland Kitchen Over the next 90 days, patient will work with BSW to address needs related to DME . Over the next 90 days, BSW will collaborate with RN Care Manager to address care management and care coordination needs  Interventions:  . Received confirmation from Andria Rhein with [place] that patient is eligible for new power wheelchair. Marland Kitchen Community Message sent to Dr. [name] requesting that he addend office visit note from 9/28, if possible, to include the following:  "I saw patient for a power wheelchair and am referring patient for a PT Mobility/Seating evaluation for Power wheelchair."  .  Marland Kitchen Patient Self Care Activities:  . Self administers medications as prescribed . Attends all scheduled provider appointments . Calls pharmacy for medication refills . Calls provider office for new concerns or questions  Please see past updates related to this goal by clicking on the "Past Updates" button in the selected goal        Will follow up with patient before the end of the week.     Ronn Melena, [place] Coordination Social Worker [place] [phone]

## 2019-12-27 NOTE — Chronic Care Management (AMB) (Signed)
  Care Management   Follow Up Note   12/27/2019 Name: Andrew Fowler MRN: [id number] DOB: [birth year]  Referred by: [name], MD Reason for referral : Care Coordination (DME)   Andrew Fowler is a 59 y.o. year old male who is a primary care patient of [name], MD. The care management team was consulted for assistance with care management and care coordination needs.    Review of patient status, including review of consultants reports, relevant laboratory and other test results, and collaboration with appropriate care team members and the patient's provider was performed as part of comprehensive patient evaluation and provision of chronic care management services.    SDOH (Social Determinants of Health) assessments performed: No See Care Plan activities for detailed interventions related to [facility])     Advanced Directives: See Care Plan and Vynca application for related entries.   Goals Addressed              This Visit's Progress   .  "I need a new wheelchair" (pt-stated)        Current Barriers:  . Patient requesting assistance with obtaining new power wheelchair, however, he is unsure of exactly how long he's had current chair and whether he is eligible for replacement.   Case Manager Clinical Goal(s):  Marland Kitchen Over the next 90 days, patient will work with BSW to address needs related to DME . Over the next 90 days, BSW will collaborate with RN Care Manager to address care management and care coordination needs  Interventions:  . Received confirmation from Andria Rhein with [place] that patient is eligible for new power wheelchair. Marland Kitchen Community Message sent to Dr. [name] requesting that he addend office visit note from 9/28, if possible, to include the following:  "I saw patient for a power wheelchair and am referring patient for a PT Mobility/Seating evaluation for Power wheelchair."  .  Marland Kitchen Patient Self Care Activities:  . Self administers  medications as prescribed . Attends all scheduled provider appointments . Calls pharmacy for medication refills . Calls provider office for new concerns or questions  Please see past updates related to this goal by clicking on the "Past Updates" button in the selected goal          Will follow up with patient before the end of the week.     Ronn Melena, [place] Coordination Social Worker [place] [phone]

## 2019-12-28 ENCOUNTER — Telehealth: Payer: Self-pay

## 2019-12-28 NOTE — Telephone Encounter (Signed)
Please contact patient to schedule televisit for new power wheelchair.  Please let Sedrick Tober know when scheduled.    Ronn Melena, [place] Coordination Social Worker [place] [phone]

## 2019-12-30 ENCOUNTER — Other Ambulatory Visit: Payer: Self-pay

## 2019-12-30 DIAGNOSIS — G894 Chronic pain syndrome: Secondary | ICD-10-CM

## 2019-12-30 MED ORDER — HYDROCODONE-ACETAMINOPHEN 10-325 MG PO TABS: 1.0000 | ORAL_TABLET | Freq: Four times a day (QID) | ORAL | 0 refills | Status: DC | PRN

## 2019-12-30 NOTE — Progress Notes (Signed)
Internal Medicine Clinic Attending  CCM services provided by the care management provider and their documentation were discussed with Dr. Marva Panda. We reviewed the pertinent findings, urgent action items addressed by the resident and non-urgent items to be addressed by the PCP.  I agree with the assessment, diagnosis, and plan of care documented in the CCM and resident's note.  Gilles Chiquito, MD 12/30/2019

## 2019-12-30 NOTE — Telephone Encounter (Signed)
HYDROcodone-acetaminophen ([place]) 10-325 MG tablet, REFILL REQUEST @  [id number] [facility] [address]:  [phone] Fax:  [phone]

## 2019-12-31 ENCOUNTER — Ambulatory Visit: Payer: Medicaid Other

## 2019-12-31 DIAGNOSIS — K7581 Nonalcoholic steatohepatitis (NASH): Secondary | ICD-10-CM

## 2019-12-31 DIAGNOSIS — J449 Chronic obstructive pulmonary disease, unspecified: Secondary | ICD-10-CM

## 2019-12-31 DIAGNOSIS — E1169 Type 2 diabetes mellitus with other specified complication: Secondary | ICD-10-CM

## 2019-12-31 NOTE — Chronic Care Management (AMB) (Signed)
Care Management   Follow Up Note   12/31/2019 Name: Andrew Fowler MRN: [id number] DOB: 09-15-1960  Referred by: [name], MD Reason for referral : Care Coordination (DME, transportation )   Andrew Fowler is a 59 y.o. year old male who is a primary care patient of [name], MD. The care management team was consulted for assistance with care management and care coordination needs.    Review of patient status, including review of consultants reports, relevant laboratory and other test results, and collaboration with appropriate care team members and the patient's provider was performed as part of comprehensive patient evaluation and provision of chronic care management services.    SDOH (Social Determinants of Health) assessments performed: No See Care Plan activities for detailed interventions related to [facility])     Advanced Directives: See Care Plan and Vynca application for related entries.   Goals Addressed              This Visit's Progress   .  "I need a new wheelchair" (pt-stated)        Current Barriers:  . Patient requesting assistance with obtaining new power wheelchair, however, he is unsure of exactly how long he's had current chair and whether he is eligible for replacement.   Case Manager Clinical Goal(s):  Marland Kitchen Over the next 90 days, patient will work with BSW to address needs related to DME . Over the next 90 days, BSW will collaborate with RN Care Manager to address care management and care coordination needs  Interventions:  . Contacted patient to inform him that he is eligible for replacement wheelchair but face to face visit must occur for insurance purposes; patient stated he would call [facility] next week to schedule appointment.   .  . Patient Self Care Activities:  . Self administers medications as prescribed . Attends all scheduled provider appointments . Calls pharmacy for medication refills . Calls provider office for new concerns or  questions  Please see past updates related to this goal by clicking on the "Past Updates" button in the selected goal      .  "I need to see a Dr. in [place] for my arthritis and need transportation" (pt-stated)        [place] (see longitudinal plan of care for additional care plan information)  Current Barriers:  . Patient states that current provider no longer accepts his insurance and new provider is in [place].  Patient utilizes [facility] [employer], however, they do not provide transportation outside of [facility].   Clinical Social Work Clinical Goal(s):  Marland Kitchen Over the next 30 days, patient will work with SW to address concerns related to transportation   Interventions: . Inter-disciplinary care team collaboration (see longitudinal plan of care) . Provided patient with contact information for [place] and encouraged patient to inquire if Medicaid transportation can be used from [place] to [place].    Patient Self Care Activities:  . Patient verbalizes understanding of plan to contact [place] regarding Medicaid transportation  . Self administers medications as prescribed . Attends all scheduled provider appointments . Unable to perform IADLs independently  Initial goal documentation         Will inform Andria Rhein with [place] when appointment has been scheduled so that paperwork can be sent.   Ronn Melena, [place] Coordination Social Worker [place] [phone]

## 2019-12-31 NOTE — Progress Notes (Signed)
Internal Medicine Clinic Resident  I have personally reviewed this encounter including the documentation in this note and/or discussed this patient with the care management provider. I will address any urgent items identified by the care management provider and will communicate my actions to the patient's PCP. I have reviewed the patient's CCM visit with my supervising attending, Dr Jimmye Norman.  Mosetta Anis, MD 12/31/2019

## 2019-12-31 NOTE — Patient Instructions (Addendum)
Visit Information  Goals Addressed              This Visit's Progress   .  "I need a new wheelchair" (pt-stated)        Current Barriers:  . Patient requesting assistance with obtaining new power wheelchair, however, he is unsure of exactly how long he's had current chair and whether he is eligible for replacement.   Case Manager Clinical Goal(s):  Marland Kitchen Over the next 90 days, patient will work with BSW to address needs related to DME . Over the next 90 days, BSW will collaborate with RN Care Manager to address care management and care coordination needs  Interventions:  . Contacted patient to inform him that he is eligible for replacement wheelchair but face to face visit must occur for insurance purposes; patient stated he would call [facility] next week to schedule appointment.   .  . Patient Self Care Activities:  . Self administers medications as prescribed . Attends all scheduled provider appointments . Calls pharmacy for medication refills . Calls provider office for new concerns or questions  Please see past updates related to this goal by clicking on the "Past Updates" button in the selected goal      .  "I need to see a Dr. in [place] for my arthritis and need transportation" (pt-stated)        [place] (see longitudinal plan of care for additional care plan information)  Current Barriers:  . Patient states that current provider no longer accepts his insurance and new provider is in [place].  Patient utilizes [employer], however, they do not provide transportation outside of [facility].   Clinical Social Work Clinical Goal(s):  Marland Kitchen Over the next 30 days, patient will work with SW to address concerns related to transportation   Interventions: . Inter-disciplinary care team collaboration (see longitudinal plan of care) . Provided patient with contact information for [place] and encouraged patient to inquire if Medicaid  transportation can be used from [place] to [place].    Patient Self Care Activities:  . Patient verbalizes understanding of plan to contact [place] regarding Medicaid transportation  . Self administers medications as prescribed . Attends all scheduled provider appointments . Unable to perform IADLs independently  Initial goal documentation        Patient verbalizes understanding of instructions provided today.   Will inform Andria Rhein with [place] when appointment has been scheduled so that paperwork can be sent.   Ronn Melena, [place] Coordination Social Worker [place] [phone]

## 2019-12-31 NOTE — Progress Notes (Signed)
Internal Medicine Clinic Attending  CCM services provided by the care management provider and their documentation were discussed with Dr. Marva Panda. We reviewed the pertinent findings, urgent action items addressed by the resident and non-urgent items to be addressed by the PCP.  I agree with the assessment, diagnosis, and plan of care documented in the CCM and resident's note.  [name], MD 12/31/2019

## 2020-01-06 ENCOUNTER — Encounter: Payer: Self-pay | Admitting: *Deleted

## 2020-01-18 ENCOUNTER — Ambulatory Visit: Payer: Medicaid Other

## 2020-01-18 DIAGNOSIS — M0579 Rheumatoid arthritis with rheumatoid factor of multiple sites without organ or systems involvement: Secondary | ICD-10-CM | POA: Diagnosis not present

## 2020-01-18 DIAGNOSIS — Z79899 Other long term (current) drug therapy: Secondary | ICD-10-CM | POA: Diagnosis not present

## 2020-01-18 DIAGNOSIS — K7581 Nonalcoholic steatohepatitis (NASH): Secondary | ICD-10-CM

## 2020-01-18 DIAGNOSIS — R7989 Other specified abnormal findings of blood chemistry: Secondary | ICD-10-CM | POA: Insufficient documentation

## 2020-01-18 DIAGNOSIS — M170 Bilateral primary osteoarthritis of knee: Secondary | ICD-10-CM | POA: Insufficient documentation

## 2020-01-18 DIAGNOSIS — Z111 Encounter for screening for respiratory tuberculosis: Secondary | ICD-10-CM | POA: Diagnosis not present

## 2020-01-18 DIAGNOSIS — E1169 Type 2 diabetes mellitus with other specified complication: Secondary | ICD-10-CM

## 2020-01-18 NOTE — Patient Instructions (Signed)
Visit Information  Goals Addressed              This Visit's Progress   .  "I need a new wheelchair" (pt-stated)        Current Barriers:  . Patient requesting assistance with obtaining new power wheelchair, however, he is unsure of exactly how long he's had current chair and whether he is eligible for replacement.   Case Manager Clinical Goal(s):  Marland Kitchen Over the next 90 days, patient will work with BSW to address needs related to DME . Over the next 90 days, BSW will collaborate with RN Care Manager to address care management and care coordination needs  Interventions:  . Signed PT evaluation order for power wheelchair faxed to Andria Rhein with [place].    . Patient Self Care Activities:  . Self administers medications as prescribed . Attends all scheduled provider appointments . Calls pharmacy for medication refills . Calls provider office for new concerns or questions  Please see past updates related to this goal by clicking on the "Past Updates" button in the selected goal           The patient has been provided with contact information for the care management team and has been advised to call with any health related questions or concerns.      Ronn Melena, [place] Coordination Social Worker [place] [phone]

## 2020-01-18 NOTE — Chronic Care Management (AMB) (Signed)
  Care Management   Follow Up Note   01/18/2020 Name: Andrew Fowler MRN: [id number] DOB: 1960-11-27  Referred by: [name], MD Reason for referral : Care Coordination (DME)   Andrew Fowler is a 59 y.o. year old male who is a primary care patient of [name], MD. The care management team was consulted for assistance with care management and care coordination needs.    Review of patient status, including review of consultants reports, relevant laboratory and other test results, and collaboration with appropriate care team members and the patient's provider was performed as part of comprehensive patient evaluation and provision of chronic care management services.    SDOH (Social Determinants of Health) assessments performed: No See Care Plan activities for detailed interventions related to [facility])     Advanced Directives: See Care Plan and Vynca application for related entries.   Goals Addressed              This Visit's Progress   .  "I need a new wheelchair" (pt-stated)        Current Barriers:  . Patient requesting assistance with obtaining new power wheelchair, however, he is unsure of exactly how long he's had current chair and whether he is eligible for replacement.   Case Manager Clinical Goal(s):  Marland Kitchen Over the next 90 days, patient will work with BSW to address needs related to DME . Over the next 90 days, BSW will collaborate with RN Care Manager to address care management and care coordination needs  Interventions:  . Signed PT evaluation order for power wheelchair faxed to Andria Rhein with [facility].    . Patient Self Care Activities:  . Self administers medications as prescribed . Attends all scheduled provider appointments . Calls pharmacy for medication refills . Calls provider office for new concerns or questions  Please see past updates related to this goal by clicking on the "Past Updates" button in the selected goal          The  patient has been provided with contact information for the care management team and has been advised to call with any health related questions or concerns.     Andrew Fowler, [place] Coordination Social Worker [place] [phone]

## 2020-01-18 NOTE — Progress Notes (Signed)
Internal Medicine Clinic Resident  I have personally reviewed this encounter including the documentation in this note and/or discussed this patient with the care management provider. I will address any urgent items identified by the care management provider and will communicate my actions to the patient's PCP. I have reviewed the patient's CCM visit with my supervising attending, Dr Evette Doffing.  Harvie Heck, MD  [facility] PGY-2 01/18/2020

## 2020-01-18 NOTE — Progress Notes (Signed)
Internal Medicine Clinic Attending  CCM services provided by the care management provider and their documentation were discussed with Dr. Marva Panda. We reviewed the pertinent findings, urgent action items addressed by the resident and non-urgent items to be addressed by the PCP.  I agree with the assessment, diagnosis, and plan of care documented in the CCM and resident's note.  Axel Filler, MD 01/18/2020

## 2020-01-31 ENCOUNTER — Telehealth: Payer: Self-pay

## 2020-01-31 ENCOUNTER — Telehealth: Payer: Self-pay | Admitting: *Deleted

## 2020-01-31 DIAGNOSIS — G894 Chronic pain syndrome: Secondary | ICD-10-CM

## 2020-01-31 MED ORDER — ALPRAZOLAM 0.5 MG PO TABS: ORAL_TABLET | ORAL | 5 refills | Status: DC

## 2020-01-31 MED ORDER — HYDROCODONE-ACETAMINOPHEN 10-325 MG PO TABS: 1.0000 | ORAL_TABLET | Freq: Four times a day (QID) | ORAL | 0 refills | Status: DC | PRN

## 2020-01-31 NOTE — Telephone Encounter (Addendum)
Information was sent through [facility] for PA for Hydrocodone Acetaminophen 10/33m tablets.  Case # [id number] Awaiting determination.   GSander Nephew RN 01/31/2020 4:36 PM.  [facility].  [facility]  Awaiting determination.  GSander Nephew RN 02/01/2020 1:56 AM.  [facility]  [facility].  Additional informaiomn was given and then information was resent through CoverMyMeds.  PA was approved 02/02/2020 thru 07/31/2020.  [facility].  Have received fax with approval.  GSander Nephew RN 02/02/2020 2:14 PM.

## 2020-01-31 NOTE — Telephone Encounter (Signed)
Need refill on ALPRAZolam (XANAX) 0.5 MG tablet, HYDROcodone-acetaminophen (NORCO) 10-325 MG tablet  ;pt contact [phone]   [id number] [place] [place] [facility] [address]

## 2020-02-01 ENCOUNTER — Encounter: Payer: Medicaid Other | Admitting: Internal Medicine

## 2020-02-01 NOTE — Telephone Encounter (Signed)
Requesting to speak with a nurse. Please call back.

## 2020-02-01 NOTE — Telephone Encounter (Signed)
Andrew Fowler stated she sent PA request to the insurance co yesterday; waiting on a response.

## 2020-02-01 NOTE — Telephone Encounter (Signed)
Return pt's call - stated Hydrocodone needs to be cleared with his insurance - I will ask Regino Schultze to check if it needs a PA. Also [facility]; his bottle has 11/11 - I asked [facility], stated he will.

## 2020-02-01 NOTE — Telephone Encounter (Signed)
Call to [facility] spoke with advocate.  pending review. Continue to await decision.

## 2020-02-08 ENCOUNTER — Encounter: Payer: Medicaid Other | Admitting: Internal Medicine

## 2020-02-28 ENCOUNTER — Other Ambulatory Visit: Payer: Self-pay

## 2020-02-28 DIAGNOSIS — G894 Chronic pain syndrome: Secondary | ICD-10-CM

## 2020-02-28 MED ORDER — HYDROCODONE-ACETAMINOPHEN 10-325 MG PO TABS: 1.0000 | ORAL_TABLET | Freq: Four times a day (QID) | ORAL | 0 refills | Status: DC | PRN

## 2020-02-28 NOTE — Telephone Encounter (Signed)
Pt is needing his HYDROcodone-acetaminophen ([place]) 10-325 MG tablet To  [id number]  [phone] Fax:  [phone]

## 2020-03-07 ENCOUNTER — Encounter: Payer: Medicaid Other | Admitting: Internal Medicine

## 2020-03-08 ENCOUNTER — Ambulatory Visit: Payer: Medicaid Other

## 2020-03-08 DIAGNOSIS — M5412 Radiculopathy, cervical region: Secondary | ICD-10-CM

## 2020-03-08 DIAGNOSIS — I10 Essential (primary) hypertension: Secondary | ICD-10-CM

## 2020-03-08 DIAGNOSIS — E1169 Type 2 diabetes mellitus with other specified complication: Secondary | ICD-10-CM

## 2020-03-08 NOTE — Patient Instructions (Signed)
Visit Information  Goals Addressed              This Visit's Progress   .  "I need a new wheelchair" (pt-stated)        Current Barriers:  . Patient requesting assistance with obtaining new power wheelchair, however, he is unsure of exactly how long he's had current chair and whether he is eligible for replacement.   Case Manager Clinical Goal(s):  Marland Kitchen Over the next 90 days, patient will work with BSW to address needs related to DME . Over the next 90 days, BSW will collaborate with RN Care Manager to address care management and care coordination needs  Interventions:  . Community Message sent to [facility] team to inform that face to face visit is scheduled for 03/14/20 @ 10:15 . Requested update on what documentation is needed at this point and what specific documentation is needed in visit note.  . Received response from Ms. Albertina Parr that required documentation will be faxed to [facility] . Will forward to provider when received.   . Patient Self Care Activities:  . Self administers medications as prescribed . Attends all scheduled provider appointments . Calls pharmacy for medication refills . Calls provider office for new concerns or questions  Please see past updates related to this goal by clicking on the "Past Updates" button in the selected goal         Once received, will forward documentation to be completed by provider for DME    Ronn Melena, [place] Worker [place] [phone]

## 2020-03-08 NOTE — Progress Notes (Signed)
Internal Medicine Clinic Attending  CCM services provided by the care management provider and their documentation were reviewed with Dr. Darrick Meigs.  We reviewed the pertinent findings, urgent action items addressed by the resident and non-urgent items to be addressed by the PCP.  I agree with the assessment, diagnosis, and plan of care documented in the CCM and resident's note.  Oda Kilts, MD 03/08/2020

## 2020-03-08 NOTE — Chronic Care Management (AMB) (Signed)
  Care Management   Follow Up Note   03/08/2020 Name: Andrew Fowler MRN: [id number] DOB: 1961-03-15  Andrew Fowler is enrolled in a Managed Medicaid plan: Yes. Outreach attempt today was successful.    Referred by: [name], MD Reason for referral : No chief complaint on file.   Andrew Fowler is a 59 y.o. year old male who is a primary care patient of [name], MD. The care management team was consulted for assistance with care management and care coordination needs.    Review of patient status, including review of consultants reports, relevant laboratory and other test results, and collaboration with appropriate care team members and the patient's provider was performed as part of comprehensive patient evaluation and provision of chronic care management services.    Goals Addressed              This Visit's Progress   .  "I need a new wheelchair" (pt-stated)        Current Barriers:  . Patient requesting assistance with obtaining new power wheelchair, however, he is unsure of exactly how long he's had current chair and whether he is eligible for replacement.   Case Manager Clinical Goal(s):  Marland Kitchen Over the next 90 days, patient will work with BSW to address needs related to DME . Over the next 90 days, BSW will collaborate with RN Care Manager to address care management and care coordination needs  Interventions:  . Community Message sent to [facility] team to inform that face to face visit is scheduled for 03/14/20 @ 10:15 . Requested update on what documentation is needed at this point and what specific documentation is needed in visit note.  . Received response from Ms. Albertina Parr that required documentation will be faxed to [facility] . Will forward to provider when received.   . Patient Self Care Activities:  . Self administers medications as prescribed . Attends all scheduled provider appointments . Calls pharmacy for medication refills . Calls  provider office for new concerns or questions  Please see past updates related to this goal by clicking on the "Past Updates" button in the selected goal          Once received, will forward documentation to be completed by provider for DME    Ronn Melena, [place] Worker [place] [phone]

## 2020-03-08 NOTE — Progress Notes (Signed)
Internal Medicine Clinic Resident  I have personally reviewed this encounter including the documentation in this note and/or discussed this patient with the care management provider. I will address any urgent items identified by the care management provider and will communicate my actions to the patient's PCP. I have reviewed the patient's CCM visit with my supervising attending, Dr Rebeca Alert.  Mitzi Hansen, MD Internal Medicine Resident PGY-2 [facility] Pager: [phone] 03/08/2020 1:09 PM

## 2020-03-13 ENCOUNTER — Ambulatory Visit: Payer: Medicaid Other

## 2020-03-13 DIAGNOSIS — J449 Chronic obstructive pulmonary disease, unspecified: Secondary | ICD-10-CM

## 2020-03-13 DIAGNOSIS — M5412 Radiculopathy, cervical region: Secondary | ICD-10-CM

## 2020-03-13 DIAGNOSIS — E1169 Type 2 diabetes mellitus with other specified complication: Secondary | ICD-10-CM

## 2020-03-14 ENCOUNTER — Ambulatory Visit: Payer: Medicaid Other | Admitting: Internal Medicine

## 2020-03-14 ENCOUNTER — Other Ambulatory Visit: Payer: Self-pay

## 2020-03-14 VITALS — BP 98/69 | HR 89 | Temp 98.3°F | Ht 71.0 in | Wt 257.0 lb

## 2020-03-14 DIAGNOSIS — Z7689 Persons encountering health services in other specified circumstances: Secondary | ICD-10-CM

## 2020-03-14 DIAGNOSIS — F418 Other specified anxiety disorders: Secondary | ICD-10-CM

## 2020-03-14 DIAGNOSIS — I10 Essential (primary) hypertension: Secondary | ICD-10-CM

## 2020-03-14 DIAGNOSIS — M06019 Rheumatoid arthritis without rheumatoid factor, unspecified shoulder: Secondary | ICD-10-CM | POA: Diagnosis present

## 2020-03-14 DIAGNOSIS — R351 Nocturia: Secondary | ICD-10-CM

## 2020-03-14 DIAGNOSIS — M25551 Pain in right hip: Secondary | ICD-10-CM

## 2020-03-14 DIAGNOSIS — F17200 Nicotine dependence, unspecified, uncomplicated: Secondary | ICD-10-CM | POA: Diagnosis not present

## 2020-03-14 DIAGNOSIS — J449 Chronic obstructive pulmonary disease, unspecified: Secondary | ICD-10-CM

## 2020-03-14 DIAGNOSIS — K7581 Nonalcoholic steatohepatitis (NASH): Secondary | ICD-10-CM

## 2020-03-14 DIAGNOSIS — G8929 Other chronic pain: Secondary | ICD-10-CM

## 2020-03-14 DIAGNOSIS — Z79891 Long term (current) use of opiate analgesic: Secondary | ICD-10-CM | POA: Diagnosis not present

## 2020-03-14 DIAGNOSIS — G4733 Obstructive sleep apnea (adult) (pediatric): Secondary | ICD-10-CM

## 2020-03-14 DIAGNOSIS — Z0000 Encounter for general adult medical examination without abnormal findings: Secondary | ICD-10-CM

## 2020-03-14 HISTORY — DX: Persons encountering health services in other specified circumstances: Z76.89

## 2020-03-14 MED ORDER — SPIRIVA HANDIHALER 18 MCG IN CAPS: 18.0000 ug | ORAL_CAPSULE | Freq: Every day | RESPIRATORY_TRACT | 2 refills | Status: DC

## 2020-03-14 NOTE — Assessment & Plan Note (Signed)
-  This problem is chronic and stable -Patient states that he has been using his albuterol inhaler more frequently and states that he wheezes every morning when he wakes up -Today he was noted to have bilateral expiratory wheeze on exam -We'll continue albuterol as needed for now but will start him on Spiriva daily as a maintenance inhaler. -If he continues to wheeze will consider changing him to a combo LAMA/LABA inhaler

## 2020-03-14 NOTE — Progress Notes (Signed)
   Subjective:    Patient ID: Andrew Fowler, male    DOB: 1960/11/01, 59 y.o.   MRN: [id number]  HPI  I have seen and examined this patient.  Patient is here primarily for assessment for a power wheelchair.  Patient does note some episodes of wheezing at home and states that he uses his albuterol inhaler frequently for this.  Patient also complains of chronic right hip pain which is limited his mobility and can only stand for a few seconds at a time because of this.  He also complains of associated bilateral knee pain.  He did follow-up with his rheumatologist recently and was started on a new medication for his RA (hydroxychloroquine).  Patient does state that he urinates frequently at night and has to wake up for this.  He denies any other complaints at this time and states that he is compliant with the medication.  Review of Systems  Constitutional: Negative.   HENT: Negative.   Respiratory: Positive for wheezing.   Cardiovascular: Negative.   Gastrointestinal: Negative.   Genitourinary:       Increased frequency of urination at night  Musculoskeletal: Positive for arthralgias. Negative for joint swelling.  Neurological: Negative.   Psychiatric/Behavioral: Negative.        Objective:   Physical Exam Constitutional:      Appearance: Normal appearance.  HENT:     Head: Normocephalic and atraumatic.  Cardiovascular:     Rate and Rhythm: Normal rate and regular rhythm.     Heart sounds: Normal heart sounds.  Pulmonary:     Effort: Pulmonary effort is normal.     Breath sounds: Wheezing present. No rales.     Comments: Bilateral expiratory wheeze noted on exam Abdominal:     General: Bowel sounds are normal. There is no distension.     Palpations: Abdomen is soft.     Tenderness: There is no abdominal tenderness.  Musculoskeletal:        General: No swelling or tenderness.     Cervical back: Neck supple.  Lymphadenopathy:     Cervical: No cervical adenopathy.   Neurological:     Mental Status: He is alert and oriented to person, place, and time.     Comments: Patient with power of 5 out of 5 bilateral upper extremities (including grip strength and flexion and extension at the elbow) as well as 5 out of 5 power with flexion and extension of the knee with decreased power with extension of the hip (2 out of 5 on the right and 3 out of 5 on the left).  Hip movement was limited secondary to pain  Psychiatric:        Mood and Affect: Mood normal.        Behavior: Behavior normal.           Assessment & Plan:  Please see problem based charting for assessment and plan:

## 2020-03-14 NOTE — Assessment & Plan Note (Signed)
-  The primary purpose of today's visit was for mobility assessment -Patient has difficulty doing his ADLs including toileting secondary to pain in his right hip and bilateral knees.  He states that he is only able to stand for a few seconds at a time secondary to the pain in his hip and knees -We'll refer the patient to physical therapy evaluation for power wheelchair

## 2020-03-14 NOTE — Assessment & Plan Note (Signed)
-  This problem is chronic and stable -Patient's PHQ nine score remains elevated at 13 today -Patient states that he takes his Xanax only as needed and has not taken it in the last week -We'll continue with Xanax as needed for now -At his next visit we'll discuss initiation of an SSRI given his elevated PHQ-9 scores

## 2020-03-14 NOTE — Assessment & Plan Note (Addendum)
-  This problem is chronic and uncontrolled -Patient has persistently elevated LFTs noted on last visit -He has an appointment to follow-up with a hepatologist in January -Etiology behind his elevated LFTs remains uncertain.  His biopsy was consistent with a venous outflow instruction but imaging did not show evidence of this -We'll follow up hepatology recommendations

## 2020-03-14 NOTE — Chronic Care Management (AMB) (Signed)
Care Management   Follow Up Note   03/14/2020 Name: Andrew Fowler MRN: [id number] DOB: July 06, 1960  Andrew Fowler is enrolled in a Managed Medicaid plan: Yes. Outreach attempt today was successful.    Referred by: [name], MD Reason for referral : Care Coordination   Andrew Fowler is a 59 y.o. year old male who is a primary care patient of [name], MD. The care management team was consulted for assistance with care management and care coordination needs.    Review of patient status, including review of consultants reports, relevant laboratory and other test results, and collaboration with appropriate care team members and the patient's provider was performed as part of comprehensive patient evaluation and provision of chronic care management services.    Goals Addressed              This Visit's Progress   .  "I need a new wheelchair" (pt-stated)        Current Barriers:  . Patient requesting assistance with obtaining new power wheelchair, however, he is unsure of exactly how long he's had current chair and whether he is eligible for replacement.   Case Manager Clinical Goal(s):  Marland Kitchen Over the next 90 days, patient will work with BSW to address needs related to DME . Over the next 90 days, BSW will collaborate with RN Care Manager to address care management and care coordination needs  Interventions:  . Community Message sent to Andria Rhein with [facility] regarding DME documentation needed for 03/14/20 office visit . Documentation given to Dr. [name] for completion . Completed documentation faxed to Andria Rhein  . Patient Self Care Activities:  . Self administers medications as prescribed . Attends all scheduled provider appointments . Calls pharmacy for medication refills . Calls provider office for new concerns or questions  Please see past updates related to this goal by clicking on the "Past Updates" button in the selected goal       .  "I need to see a Dr. in [place] for my arthritis and need transportation" (pt-stated)        [name] (see longitudinal plan of care for additional care plan information)  Current Barriers:  . Patient states that current provider no longer accepts his insurance and new provider is in [place].  Patient utilizes [facility], however, they do not provide transportation outside of [facility].   Clinical Social Work Clinical Goal(s):  Marland Kitchen Over the next 30 days, patient will work with SW to address concerns related to transportation   Interventions: . Inter-disciplinary care team collaboration (see longitudinal plan of care) . Provided patient with contact information for [place] and encouraged patient to inquire if Medicaid transportation can be used from [place] to [place].    Patient Self Care Activities:  . Patient verbalizes understanding of plan to contact [place] regarding Medicaid transportation  . Self administers medications as prescribed . Attends all scheduled provider appointments . Unable to perform IADLs independently  Please see past updates related to this goal by clicking on the "Past Updates" button in the selected goal          The patient has been provided with contact information for the care management team and has been advised to call with any health related questions or concerns.     Ronn Melena, [place] Coordination Social Worker [place] [phone]

## 2020-03-14 NOTE — Assessment & Plan Note (Signed)
-  Patient states that he gets up multiple times at night to use the restroom -States that he drinks a lot of fluid (water and soda) and has noted that when he cuts down food intake before he goes to bed he does not have to wake up as often -Patient states that he has some difficulty initiating a stream but states that the stream is strong once he initiates it -I suspect the patient likely has BPH.  We will discuss this in detail with him at his follow-up visit

## 2020-03-14 NOTE — Assessment & Plan Note (Signed)
-  This problem is chronic and stable -Patient states that his pain has gotten better since starting hydroxychloroquine -He still has difficulty doing his ADLs secondary to pain and the benefits of continuing his pain medication currently outweigh the risks -We'll continue with Norco 10/325 mg every 6 hours as needed -Patient would like three refills at a time when he needs a refill -We'll follow up a UDS today -PDMP reviewed and appropriate

## 2020-03-14 NOTE — Assessment & Plan Note (Signed)
-  Patient does have a history of moderate OSA which is noted in 2014 -He states he was unable to tolerate the old CPAP machine but is willing to try this again if needed -We referred him to sleep center for another sleep study but he states that he has not been contacted yet.  Will reach out to the sleep center to see when he can be scheduled -Patient does state that he wakes up frequently at night and feels tired the next morning.  He likely has OSA and will probably need a CPAP. -We'll follow up sleep study

## 2020-03-14 NOTE — Patient Instructions (Signed)
Visit Information  Goals Addressed              This Visit's Progress   .  "I need a new wheelchair" (pt-stated)        Current Barriers:  . Patient requesting assistance with obtaining new power wheelchair, however, he is unsure of exactly how long he's had current chair and whether he is eligible for replacement.   Case Manager Clinical Goal(s):  Marland Kitchen Over the next 90 days, patient will work with BSW to address needs related to DME . Over the next 90 days, BSW will collaborate with RN Care Manager to address care management and care coordination needs  Interventions:  . Community Message sent to Andria Rhein with [facility] regarding DME documentation needed for 03/14/20 office visit . Documentation given to Dr. [name] for completion . Completed documentation faxed to Andria Rhein  . Patient Self Care Activities:  . Self administers medications as prescribed . Attends all scheduled provider appointments . Calls pharmacy for medication refills . Calls provider office for new concerns or questions  Please see past updates related to this goal by clicking on the "Past Updates" button in the selected goal      .  "I need to see a Dr. in [place] for my arthritis and need transportation" (pt-stated)        [place] (see longitudinal plan of care for additional care plan information)  Current Barriers:  . Patient states that current provider no longer accepts his insurance and new provider is in [place].  Patient utilizes [facility], however, they do not provide transportation outside of [facility].   Clinical Social Work Clinical Goal(s):  Marland Kitchen Over the next 30 days, patient will work with SW to address concerns related to transportation   Interventions: . Inter-disciplinary care team collaboration (see longitudinal plan of care) . Provided patient with contact information for [place] and encouraged patient to inquire  if Medicaid transportation can be used from [place] to [place].    Patient Self Care Activities:  . Patient verbalizes understanding of plan to contact [place] regarding Medicaid transportation  . Self administers medications as prescribed . Attends all scheduled provider appointments . Unable to perform IADLs independently  Please see past updates related to this goal by clicking on the "Past Updates" button in the selected goal           The patient has been provided with contact information for the care management team and has been advised to call with any health related questions or concerns.     Ronn Melena, [place] Coordination Social Worker [place] [phone]

## 2020-03-14 NOTE — Patient Instructions (Signed)
-  This visit was primarily for a power wheelchair assessment.  I have put in referral to the physical therapy center for evaluation for a power wheelchair -I have also started you on a new inhaler but I put into your pharmacy ([facility] [address]  Please take this inhaler once daily -I have also put in a referral to orthopedics.  You should expect a call from their office for follow-up appointment -I spoken with Ms. Mamie and she will follow up on the referral to the sleep center for you -We will discuss your issues with frequent urination at your next visit.  Please try and decrease the amount of fluid you're drinking at night as this may help decrease the amount you're urinating -Please call me if you have any questions or concerns

## 2020-03-14 NOTE — Assessment & Plan Note (Signed)
-  This problem is chronic and stable -Patient states that he followed up with Dr. Patrecia Pour recently and was started on hydroxychloroquine 200 mg daily for his RA -He states that he has noted less flareups since starting this medication -Patient is also on Norco chronically for his pain -We'll continue with his current medications for now

## 2020-03-14 NOTE — Assessment & Plan Note (Signed)
-  Patient has chronic right hip pain.  He previously had a left hip replacement which improved the pain in that hip -He is currently reluctant for a right hip replacement but suspect that he may need it -I suspect that his right hip pain is secondary to chronic OA.  However, patient wanted to defer x-rays today till follow-up with orthopedics -We'll put in referral to orthopedics today -I suspect that he will likely need a right hip replacement

## 2020-03-14 NOTE — Assessment & Plan Note (Signed)
-  Patient has received both his Covid vaccines -We will give him a FIT test card for colon cancer screening today

## 2020-03-14 NOTE — Progress Notes (Signed)
Internal Medicine Clinic Resident  I have personally reviewed this encounter including the documentation in this note and/or discussed this patient with the care management provider. I will address any urgent items identified by the care management provider and will communicate my actions to the patient's PCP. I have reviewed the patient's CCM visit with my supervising attending, Dr Rebeca Alert.  Jean Rosenthal, MD 03/14/2020

## 2020-03-14 NOTE — Assessment & Plan Note (Signed)
-  This problem is chronic and stable -Patient blood pressure is currently well controlled (initially mildly low at 48/18 but systolics improved to 590B on repeat) -Patient is asymptomatic and denies any lightheadedness or dizziness -We'll continue with lisinopril 10 mg daily -No further work-up at this time

## 2020-03-14 NOTE — Assessment & Plan Note (Signed)
-  Patient states that he remains an active smoker currently but is trying to quit -We will address this with him at his next visit

## 2020-03-23 LAB — TOXASSURE SELECT,+ANTIDEPR,UR

## 2020-03-29 NOTE — Progress Notes (Signed)
Internal Medicine Clinic Attending  CCM services provided by the care management provider and their documentation were reviewed with Dr. Eileen Stanford.  We reviewed the pertinent findings, urgent action items addressed by the resident and non-urgent items to be addressed by the PCP.  I agree with the assessment, diagnosis, and plan of care documented in the CCM and resident's note.  Oda Kilts, MD 03/29/2020

## 2020-03-30 DIAGNOSIS — I871 Compression of vein: Secondary | ICD-10-CM | POA: Diagnosis not present

## 2020-03-30 DIAGNOSIS — R748 Abnormal levels of other serum enzymes: Secondary | ICD-10-CM | POA: Diagnosis not present

## 2020-03-30 DIAGNOSIS — R768 Other specified abnormal immunological findings in serum: Secondary | ICD-10-CM | POA: Diagnosis not present

## 2020-03-31 ENCOUNTER — Other Ambulatory Visit: Payer: Self-pay

## 2020-03-31 DIAGNOSIS — G894 Chronic pain syndrome: Secondary | ICD-10-CM

## 2020-03-31 MED ORDER — HYDROCODONE-ACETAMINOPHEN 10-325 MG PO TABS: 1.0000 | ORAL_TABLET | Freq: Four times a day (QID) | ORAL | 0 refills | Status: DC | PRN

## 2020-03-31 NOTE — Telephone Encounter (Signed)
Need refill on  HYDROcodone-acetaminophen (NORCO) 10-325 MG tablet ;pt contact [phone]   [id number] [place] [place]

## 2020-04-04 ENCOUNTER — Encounter: Payer: Self-pay | Admitting: Physical Therapy

## 2020-04-04 ENCOUNTER — Ambulatory Visit: Payer: Medicaid Other | Attending: Internal Medicine | Admitting: Physical Therapy

## 2020-04-04 ENCOUNTER — Other Ambulatory Visit: Payer: Self-pay

## 2020-04-04 DIAGNOSIS — M25512 Pain in left shoulder: Secondary | ICD-10-CM | POA: Diagnosis not present

## 2020-04-04 DIAGNOSIS — M25551 Pain in right hip: Secondary | ICD-10-CM | POA: Insufficient documentation

## 2020-04-04 DIAGNOSIS — R262 Difficulty in walking, not elsewhere classified: Secondary | ICD-10-CM | POA: Insufficient documentation

## 2020-04-04 DIAGNOSIS — M25552 Pain in left hip: Secondary | ICD-10-CM | POA: Diagnosis not present

## 2020-04-04 DIAGNOSIS — M25562 Pain in left knee: Secondary | ICD-10-CM | POA: Diagnosis not present

## 2020-04-04 DIAGNOSIS — G8929 Other chronic pain: Secondary | ICD-10-CM | POA: Diagnosis not present

## 2020-04-04 DIAGNOSIS — M6281 Muscle weakness (generalized): Secondary | ICD-10-CM | POA: Insufficient documentation

## 2020-04-04 DIAGNOSIS — M25511 Pain in right shoulder: Secondary | ICD-10-CM | POA: Diagnosis not present

## 2020-04-04 DIAGNOSIS — R29898 Other symptoms and signs involving the musculoskeletal system: Secondary | ICD-10-CM | POA: Diagnosis not present

## 2020-04-04 DIAGNOSIS — M25561 Pain in right knee: Secondary | ICD-10-CM | POA: Diagnosis not present

## 2020-04-04 NOTE — Therapy (Addendum)
[facility] [address] [place], [place], [address] Phone: [phone]   Fax:  [phone]  Physical Therapy Evaluation  Patient Details  Name: Andrew Fowler MRN: [id number] Date of Birth: 1960/08/04 Referring Provider (PT): [name], MD   Encounter Date: 04/04/2020   PT End of Session - 04/04/20 1421    Visit Number 1    Number of Visits 1    Date for PT Re-Evaluation 04/04/20    Authorization Type Medicaid [employer]    PT Start Time 1320    PT Stop Time 1400    PT Time Calculation (min) 40 min    Activity Tolerance Patient tolerated treatment well    Behavior During Therapy [facility] for tasks assessed/performed           Past Medical History:  Diagnosis Date   Abnormal laboratory test result 07/04/2011   Immunofixation electrophoresis 3/13 showed slightly restricted mobility in the IgG and kappa lanes and suggested repeat end of year.    Anxiety associated with depression 04/19/2011   On chronic benzos and SSRI's. Gets panic attacks. Does not see mental health.   Chronic pain syndrome 04/19/2011   Combination of RA, obesity, OA, & DDD. Has seen Dr Sharol Given & s/p R total hip arthroplasty 2/2 OA & ? AVN. s/p L4-L5 laminectomy. Lumbar MRI 4/11 : L3-L4 mod central canal narrowing.  Cervical MRI 6/11 : multi-level DDD and L foraminal stenosis at C4-5 and C5-6.    Chronic venous insufficiency 2013   ECHO 2013 was normal. LFT's, creatinine, TSH all normal. Alb a bit low.   COPD (chronic obstructive pulmonary disease) ([place])    Per pt, he has a diagnosis of COPD. No PFT's. Uses Alb MDI less than once a month.   Diabetes mellitus    Non-insulin dependent Type II.   Elevated liver function tests 05/17/2011   04/2010. Increased GGT (pt uses ETOH). AMA negative. ABD U/S limited but otherwise negative. Follow Alk phos level. AST & ALT also elevated. False + IgM Hep B Core Ab. Hep B viral load negative.   Same pattern  during hospitalization 2010 : highest alkaline phosphatase was 355, AST 259, and ALT 871. ANA, rheumatoid factor, ceruloplasmin, CMV IgM, alpha antitrypsin, and AMA, all within normal limits.   Gout    Pt has never had a crystal diagnosis.   Hepatitis B antibody positive 2013   IgM Hep B core Ab + but Hepatitis B viral load negative.    Hyperlipidemia    At goal of LDL <100 with statin.   Hypertension    ACEI monotherapy   NASH (nonalcoholic steatohepatitis), ? some EtOH contribution and fibrosis  05/17/2011   Elevated since 05/2008 during hospitalization. Relatively stable. Extensive W/U and no etiology but likely fatty liver 2/2 obesity despite normal imaging.  Hepatitis - all negative. There was an initial + IgM Hep B core Ab but repeat testing was negative and Hep B viral load was negative.  TTG 06/15/2013  ANA x 2 (05/07/11 and 06/13/2008) ASMA x2 (07/18/2011 negative & weakly + at 21 on 05/27/2011 & nega   OA (osteoarthritis)    Obesity, Class III, BMI 40-49.9 (morbid obesity) ([place])    Obstructive sleep apnea 08/21/08   Sleep study AHI 16.4 with desat to 66%. CPAP of 17 decreased AHI to 0.9. Non-compliant with CPAP.   RA (rheumatoid arthritis) ([place]) 2013   Shortness of breath    Tobacco abuse    Per pt, he  has a diagnosis of COPD. Need to locate PFT's.   Wheelchair bound     Past Surgical History:  Procedure Laterality Date   LAMINECTOMY  1995   L4-L5   TONSILLECTOMY     TOTAL HIP ARTHROPLASTY Left 2009    There were no vitals filed for this visit.    Subjective Assessment - 04/04/20 1418    Subjective Pt referred for new power wheelchair; pt currently using group 2 power w/c which is 68-59 years old.  Pt has had to add 2 extra cushions for pressure relief due to pain.    Patient is accompained by: Family member    Pertinent History anxiety, COPD, depression, DM, gout, HLD, HTN, leukocytosis, nonalcoholic steatohepatitis, OSA, psoriasis, RA, OA, chronic pain in bilat  knees, chronic hip pain, tobacco use disorder    How long can you stand comfortably? 10-15 seconds    Patient Stated Goals to obtain a new power w/c    Currently in Pain? Yes    Pain Score 8     Pain Location Hip    Pain Orientation Right;Left    Pain Descriptors / Indicators Discomfort    Pain Type Chronic pain              [facility] PT Assessment - 04/04/20 1420      Assessment   Medical Diagnosis Power mobility evaluation; RA    Referring Provider (PT) [name], MD    Onset Date/Surgical Date 03/14/20    Hand Dominance Right      Precautions   Precautions Other (comment)    Precaution Comments anxiety, COPD, depression, DM, gout, HLD, HTN, leukocytosis, nonalcoholic steatohepatitis, OSA, psoriasis, RA, OA, chronic pain in bilat knees, chronic hip pain, tobacco use disorder      Balance Screen   Has the patient fallen in the past 6 months No      Prior Function   Level of Independence Independent with basic ADLs;Independent with household mobility with device;Independent with community mobility with device;Independent with homemaking with wheelchair;Requires assistive device for independence             Mobility/Seating Evaluation    PATIENT INFORMATION: Name: Andrew Fowler DOB: 07/09/1960  Sex: Male Date seen: 1.11.2022 Time: 13:15  Address:  [address]  [place] [address] Physician: [name], MD This evaluation/justification form will serve as the LMN for the following suppliers: __________________________ Supplier: [employer] Contact Person: Luz Brazen, ATP Phone:  [phone]   Seating Therapist: Misty Stanley, PT Phone:   [phone]   Phone: [phone]     Spouse/Parent/Caregiver name: [name]  Phone number: [phone] Insurance/Payer: [place] Medicaid [employer]     Reason for Referral: Power mobility  Patient/Caregiver Goals: To obtain a new power wheelchair to maintain independent mobility  Patient was  seen for face-to-face evaluation for new power wheelchair.  Also present was ATP and patient's brother [name]) to discuss recommendations and wheelchair options.  Further paperwork was completed and sent to vendor.  Patient appears to qualify for power mobility device at this time per objective findings.   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 Onset: 8-10 years ago; pt unsure of exact date Diagnosis: J44.9 (ICD-10-CM) - Chronic obstructive pulmonary disease, unspecified   [] Progressive Disease Relevant past and future surgeries: Previous L THA in 2009; Is going for consult for possible hip or knee replacement surgery     Height: 5'11" Weight: 257  Explain recent changes or trends in weight: None   History including Falls: No falls; PMH includes: anxiety, COPD, depression, DM, gout, HLD, HTN, leukocytosis, nonalcoholic steatohepatitis, OSA, psoriasis, RA, OA, chronic pain in bilat knees, chronic hip pain, tobacco abuse disorder, L THA 2009    HOME ENVIRONMENT: [x] House  [] Condo/town home  [] Apartment  [] Assisted Living    [] Lives Alone [x]  Lives with Others                                                                                          Hours with caregiver: ?????  [x] Home is accessible to patient           Stairs      [] Yes []  No     Ramp [x] Yes [] No Comments:  One level home, wood floors, wide doorways - bathroom too small to fit into-pt uses BSC and sponge bathes at the sink   COMMUNITY ADL: TRANSPORTATION: [] Car    [] Van    [x] Public Transportation    [] Adapted w/c Lift    [] Ambulance    [] Other:       [x] Sits in wheelchair during transport  Employment/School: ????? Specific requirements pertaining to mobility ?????  Other: ?????    FUNCTIONAL/SENSORY PROCESSING SKILLS:  Handedness:   [x] Right     [] Left    [] NA  Comments:  ?????  Functional Processing Skills for Wheeled  Mobility [x] Processing Skills are adequate for safe wheelchair operation  Areas of concern than may interfere with safe operation of wheelchair Description of problem   []  Attention to environment      [] Judgment      []  Hearing  []  Vision or visual processing      [] Motor Planning  []  Fluctuations in Behavior  ?????    VERBAL COMMUNICATION: [x] WFL receptive [x]  WFL expressive [] Understandable  [] Difficult to understand  [] non-communicative []  Uses an augmented communication device  CURRENT SEATING / MOBILITY: Current Mobility Base:  [] None [] Dependent [] Manual [] Scooter [x] Power  Type of Control: R Engineer, technical sales:  JazzySize:  18 x 20 Age: 73-6 years  Current Condition of Mobility Base:  Fair   Current Wheelchair components:  Captain's seat, cushion, flip down center foot plate, had to buy two extra cushions to improve pressure relief  Describe posture in present seating system:  hips ABD, posterior pelvic tilt      SENSATION and SKIN ISSUES: Sensation [x] Intact  [] Impaired [] Absent  Level of sensation: ????? Pressure Relief: Able to perform effective pressure relief :    [] Yes  [x]  No Method: ????? If not, Why?: Only able to stand for 10-15 seconds due to hip and knee pain and imbalance, needs UE support to stand  Skin Issues/Skin Integrity Current Skin Issues  [] Yes [x] No [x] Intact []  Red area[]  Open Area  [] Scar Tissue [] At risk from prolonged sitting Where  ?????  History of Skin Issues  [] Yes [x] No Where  ????? When  ?????  Hx of skin flap surgeries  [] Yes [x] No Where  ????? When  ?????  Limited sitting tolerance [x] Yes [] No Hours spent sitting in wheelchair daily: 4 hours; limited due to pain  Complaint of Pain:  Please describe: Chronic hip and knee pain from arthritis   Swelling/Edema: No   ADL STATUS (in reference to wheelchair use):  Indep Assist Unable Indep with Equip Not assessed Comments  Dressing []  []  []  [x]  []  Seated EOB   Eating [x]  []  []  []  []   Using wheelchair  Toileting []  []  []  [x]  []  Transfers w/c <> BSC  Bathing []  []  []  [x]  []  sitting in the wheelchair, bathes at the sink  Grooming/Hygiene []  []  []  [x]  []  Using wheelchair  Meal Prep []  []  []  [x]  []  Simple meal prep in the wheelchair  IADLS []  []  []  [x]  []  Using wheelchair to go to stores  Bowel Management: [x] Continent  [] Incontinent  [] Accidents Comments:  ?????  Bladder Management: [x] Continent  [] Incontinent  [] Accidents Comments:  ?????     WHEELCHAIR SKILLS: Manual w/c Propulsion: [] UE or LE strength and endurance sufficient to participate in ADLs using manual wheelchair Arm : [] left [] right   [] Both      Distance: ????? Foot:  [] left [] right   [] Both  Operate Scooter: []  Strength, hand grip, balance and transfer appropriate for use [] Living environment is accessible for use of scooter  Operate Power w/c:  [x]  Std. Joystick   []  Alternative Controls Indep [x]  Assist []  Dependent/unable []  N/A []   [x] Safe          [x]  Functional      Distance: 1,000  Bed confined without wheelchair [x]  Yes []  No   STRENGTH/RANGE OF MOTION:  Active Range of Motion Strength  Shoulder to 90 degrees bilaterally 3+/5 bilaterally  Elbow WFL 3+/5  Wrist/Hand WFL 3+/5  Hip Limited flexion due to weakness 2/5  Knee Lacking 10-15 degrees to full extension 3+/5  Ankle 90 deg bilaterally 3+/5 DF     MOBILITY/BALANCE:  []  Patient is totally dependent for mobility  ?????    Balance Transfers Ambulation  Sitting Balance: Standing Balance: []  Independent []  Independent/Modified Independent  []  WFL     []  WFL [x]  Supervision []  Supervision  [x]  Uses UE for balance  []  Supervision []  Min Assist []  Ambulates with Assist  ?????    []  Min Assist [x]  Min assist []  Mod Assist []  Ambulates with Device:      []  RW  []  StW  []  Cane  []  ?????  []  Mod Assist []  Mod assist []  Max assist   []  Max Assist []  Max assist []  Dependent []  Indep. Short Distance Only  []  Unable []  Unable []  Lift / Sling  Required Distance (in feet)  ?????   []  Sliding board [x]  Unable to Ambulate (see explanation below)  Cardio Status:  [x] Intact  []  Impaired   []  NA     ?????  Respiratory Status:  [] Intact   [x] Impaired   [] NA     COPD, OSA  Orthotics/Prosthetics: ?????  Comments (Address manual vs power w/c vs scooter): Steadman Prosperi has mobility limitation that significantly impairs safe, timely participation in one or more of his mobility related ADLs.  Ladarious was diagnosed with rheumatoid arthritis 8-10 years ago and has experienced progressive joint degeneration, limited ROM, UE/LE weakness and chronic pain.  Jimmys mobility deficit cannot be remediated with a cane or walker due to significant UE and LE weakness and chronic pain in bilateral shoulders, hips and knees.  Dysen has been non-ambulatory for one year and requires significant UE support to stand and transfer but is only able to stand for 10-15 seconds at a time due to pain.  Laverna Peace  is unable to propel any type of manual wheelchair because he lacks sufficient shoulder range of motion, upper extremity weakness and impaired pulmonary function due to COPD.  Ramiz is not able to safely operate a power scooter (POV) due to lack of shoulder strength to drive a tiller style propulsion system.  He also lacks sufficient standing balance and lower extremity strength to safely transfer on to and off a power scooter.  Jeramiah has been safely utilizing a power wheelchair for >5 years for independence with household mobility and MRADLs and he requires the continued use of a group 2 power wheelchair to maintain independent mobility, for safe participation in MRADLs for energy conservation and to reduce his risk for falls.         Anterior / Posterior Obliquity Rotation-Pelvis ?????  PELVIS    []  [x]  []   Neutral Posterior Anterior  []  []  [x]   WFL Rt elev Lt elev  [x]  []  []   WFL Right Left                      Anterior    Anterior     []  Fixed []  Other []  Partly  Flexible []  Flexible   []  Fixed []  Other [x]  Partly Flexible  []  Flexible  []  Fixed []  Other []  Partly Flexible  []  Flexible   TRUNK  []  [x]  []   WFL ? Thoracic ? Lumbar  Kyphosis Lordosis  [x]  []  []   WFL Convex Convex  Right Left [] c-curve [] s-curve [] multiple  [x]  Neutral []  Left-anterior []  Right-anterior     []  Fixed []  Flexible [x]  Partly Flexible []  Other  []  Fixed []  Flexible []  Partly Flexible []  Other  []  Fixed             []  Flexible []  Partly Flexible []  Other    Position Windswept  ?????  HIPS          []            [x]               []    Neutral       Abduct        ADduct         [x]           []            []   Neutral Right           Left      []  Fixed []  Subluxed [x]  Partly Flexible []  Dislocated []  Flexible  []  Fixed []  Other []  Partly Flexible  []  Flexible                 Foot Positioning Knee Positioning  Leg length discrepency: LLE longer than R    [x]  WFL  [] Lt [] Rt []  WFL  [x] Lt [x] Rt    KNEES ROM concerns: ROM concerns:    & Dorsi-Flexed [] Lt [] Rt 10-15 deg limited extension    FEET Plantar Flexed [] Lt [] Rt      Inversion                 [] Lt [] Rt      Eversion                 [] Lt [] Rt     HEAD [x]  Functional [x]  Good Head Control  ?????  & []  Flexed         []  Extended []  Adequate Head Control    NECK []  Rotated  Lt  []   Lat Flexed Lt []  Rotated  Rt []  Lat Flexed Rt []  Limited Head Control     []  Cervical Hyperextension []  Absent  Head Control     SHOULDERS ELBOWS WRIST& HAND ?????      Left     Right    Left     Right    Left     Right   U/E [x] Functional           [x] Functional [facility] WFL [] Fisting             [] Fisting      [] elev   [] dep      [] elev   [] dep       [] pro -[] retract     [] pro  [] retract [] subluxed             [] subluxed           Goals for Wheelchair Mobility  [x]  Independence with mobility in the home with motor related ADLs (MRADLs)  [x]  Independence with MRADLs in the community []  Provide dependent mobility  []   Provide recline     [] Provide tilt   Goals for Seating system [x]  Optimize pressure distribution [x]  Provide support needed to facilitate function or safety []  Provide corrective forces to assist with maintaining or improving posture []  Accommodate clients posture:   current seated postures and positions are not flexible or will not tolerate corrective forces []  Client to be independent with relieving pressure in the wheelchair [] Enhance physiological function such as breathing, swallowing, digestion  Simulation ideas/Equipment trials:????? State why other equipment was unsuccessful:?????   MOBILITY BASE RECOMMENDATIONS and JUSTIFICATION: MOBILITY COMPONENT JUSTIFICATION  Manufacturer: Theatre manager: 600 ES   Size: Width 18Seat Depth 20 [x] provide transport from point A to B      [x] promote Indep mobility  [x] is not a safe, functional ambulator [x] walker or cane inadequate [] non-standard width/depth necessary to accommodate anatomical measurement []  ?????  [] Manual Mobility Base [] non-functional ambulator    [] Scooter/POV  [] can safely operate  [] can safely transfer   [] has adequate trunk stability  [] cannot functionally propel manual w/c  [x] Power Mobility Base  [x] non-ambulatory  [x] cannot functionally propel manual wheelchair  [x]  cannot functionally and safely operate scooter/POV [x] can safely operate and willing to  [] Stroller Base [] infant/child  [] unable to propel manual wheelchair [] allows for growth [] non-functional ambulator [] non-functional UE [] Indep mobility is not a goal at this time  [] Tilt  [] Forward [] Backward [] Powered tilt  [] Manual tilt  [] change position against gravitational force on head and shoulders  [] change position for pressure relief/cannot weight shift [] transfers  [] management of tone [] rest periods [] control edema [] facilitate postural control  []  ?????  [] Recline  [] Power recline on power base [] Manual recline on manual base  [] accommodate  femur to back angle  [] bring to full recline for ADL care  [] change position for pressure relief/cannot weight shift [] rest periods [] repositioning for transfers or clothing/diaper /catheter changes [] head positioning  [] Lighter weight required [] self- propulsion  [] lifting []  ?????  [] Heavy Duty required [] user weight greater than 250# [] extreme tone/ over active movement [] broken frame on previous chair []  ?????  []  Back  []  Angle Adjustable []  Custom molded ????? [] postural control [] control of tone/spasticity [] accommodation of range of motion [] UE functional control [] accommodation for seating system []  ????? [] provide lateral trunk support [] accommodate deformity [] provide posterior trunk support [] provide lumbar/sacral support [] support trunk in midline [] Pressure relief over spinal processes  []  Seat Cushion ????? [] impaired sensation  [] decubitus ulcers present [] history of pressure ulceration []   prevent pelvic extension [] low maintenance  [] stabilize pelvis  [] accommodate obliquity [] accommodate multiple deformity [] neutralize lower extremity position [] increase pressure distribution []  ?????  []  Pelvic/thigh support  []  Lateral thigh guide []  Distal medial pad  []  Distal lateral pad []  pelvis in neutral [] accommodate pelvis []  position upper legs []  alignment []  accommodate ROM []  decr adduction [] accommodate tone [] removable for transfers [] decr abduction  []  Lateral trunk Supports []  Lt     []  Rt [] decrease lateral trunk leaning [] control tone [] contour for increased contact [] safety  [] accommodate asymmetry []  ?????  []  Mounting hardware  [] lateral trunk supports  [] back   [] seat [] headrest      []  thigh support [] fixed   [] swing away [] attach seat platform/cushion to w/c frame [] attach back cushion to w/c frame [] mount postural supports [] mount headrest  [] swing medial thigh support away [] swing lateral supports away for transfers  []  ?????     Armrests  [] fixed [x] adjustable height [] removable   [] swing away  [x] flip back   [] reclining [x] full length pads [] desk    [] pads tubular  [x] provide support with elbow at 90   [] provide support for w/c tray [x] change of height/angles for variable activities [x] remove for transfers [] allow to come closer to table top [x] remove for access to tables []  ?????  Hangers/ Leg rests  [] 60 [] 70 [] 90 [] elevating [] heavy duty  [] articulating [] fixed [] lift off [] swing away     [] power [] provide LE support  [] accommodate to hamstring tightness [] elevate legs during recline   [] provide change in position for Legs [] Maintain placement of feet on footplate [] durability [] enable transfers [] decrease edema [] Accommodate lower leg length []  ?????  Foot support Footplate    [] Lt  []  Rt  [x]  Center mount [x] flip up     [] depth/angle adjustable [] Amputee adapter    []  Lt     []  Rt [x] provide foot support [] accommodate to ankle ROM [x] transfers [] Provide support for residual extremity []  allow foot to go under wheelchair base []  decrease tone  []  ?????  []  Ankle strap/heel loops [] support foot on foot support [] decrease extraneous movement [] provide input to heel  [] protect foot  Tires: [x] pneumatic  [x] flat free inserts  [] solid  [x] decrease maintenance  [x] prevent frequent flats [] increase shock absorbency [] decrease pain from road shock [] decrease spasms from road shock []  ?????  []  Headrest  [] provide posterior head support [] provide posterior neck support [] provide lateral head support [] provide anterior head support [] support during tilt and recline [] improve feeding   [] improve respiration [] placement of switches [] safety  [] accommodate ROM  [] accommodate tone [] improve visual orientation  []  Anterior chest strap []  Vest []  Shoulder retractors  [] decrease forward movement of shoulder [] accommodation of TLSO [] decrease forward movement of trunk [] decrease shoulder  elevation [] added abdominal support [] alignment [] assistance with shoulder control  []  ?????  Pelvic Positioner [x] Belt [] SubASIS bar [] Dual Pull [] stabilize tone [x] decrease falling out of chair/ **will not Decr potential for sliding due to pelvic tilting [] prevent excessive rotation [] pad for protection over boney prominence [] prominence comfort [] special pull angle to control rotation []  ?????  Upper Extremity Support [] L   []  R [] Arm trough    [] hand support []  tray       [] full tray [] swivel mount [] decrease edema      [] decrease subluxation   [] control tone   [] placement for AAC/Computer/EADL [] decrease gravitational pull on shoulders [] provide midline positioning [] provide support to increase UE function [] provide hand support in natural position [] provide work surface   POWER WHEELCHAIR CONTROLS  [x] Proportional  [] Non-Proportional Type  Joystick [] Left  [x] Right [x] provides access for controlling wheelchair   [] lacks motor control to operate proportional drive control [] unable to understand proportional controls  Actuator Control Module  [] Single  [] Multiple   [] Allow the client to operate the power seat function(s) through the joystick control   [] Safety Reset Switches [] Used to change modes and stop the wheelchair when driving in latch mode    [] Upgraded Electronics   [] programming for accurate control [] progressive Disease/changing condition [] non-proportional drive control needed [] Needed in order to operate power seat functions through joystick control   [] Display box [] Allows user to see in which mode and drive the wheelchair is set  [] necessary for alternate controls    [] Digital interface electronics [] Allows w/c to operate when using alternative drive controls  [] ASL Head Array [] Allows client to operate wheelchair  through switches placed in tri-panel headrest  [] Sip and puff with tubing kit [] needed to operate sip and puff drive controls  [] Upgraded  tracking electronics [] increase safety when driving [] correct tracking when on uneven surfaces  [x] Mount for switches or joystick [x] Attaches switches to w/c  [] Swing away for access or transfers [x] midline for optimal placement [x] provides for consistent access  [] Attendant controlled joystick plus mount [] safety [] long distance driving [] operation of seat functions [] compliance with transportation regulations []  ?????    Rear wheel placement/Axle adjustability [] None [] semi adjustable [] fully adjustable  [] improved UE access to wheels [] improved stability [] changing angle in space for improvement of postural stability [] 1-arm drive access [] amputee pad placement []  ?????  Wheel rims/ hand rims  [] metal  [] plastic coated [] oblique projections [] vertical projections [] Provide ability to propel manual wheelchair  []  Increase self-propulsion with hand weakness/decreased grasp  Push handles [] extended  [] angle adjustable  [] standard [] caregiver access [] caregiver assist [] allows hooking to enable increased ability to perform ADLs or maintain balance  One armed device  [] Lt   [] Rt [] enable propulsion of manual wheelchair with one arm   []  ?????   Brake/wheel lock extension []  Lt   []  Rt [] increase indep in applying wheel locks   [] Side guards [] prevent clothing getting caught in wheel or becoming soiled []  prevent skin tears/abrasions  Battery: NF22 x 2 [x] to power wheelchair ?????  Other: ????? ????? ?????  The above equipment has a life- long use expectancy. Growth and changes in medical and/or functional conditions would be the exceptions. This is to certify that the therapist has no financial relationship with durable medical provider or manufacturer. The therapist will not receive remuneration of any kind for the equipment recommended in this evaluation.   Patient has mobility limitation that significantly impairs safe, timely participation in one or more mobility related ADLs.   (bathing, toileting, feeding, dressing, grooming, moving from room to room)                                                             [x]  Yes []  No Will mobility device sufficiently improve ability to participate and/or be aided in participation of MRADLs?         [x]  Yes []  No Can limitation be compensated for with use of a cane or walker?                                                                                []   Yes [x]  No Does patient or caregiver demonstrate ability/potential ability & willingness to safely use the mobility device?   [x]  Yes []  No Does patients home environment support use of recommended mobility device?                                                    [x]  Yes []  No Does patient have sufficient upper extremity function necessary to functionally propel a manual wheelchair?    []  Yes [x]  No Does patient have sufficient strength and trunk stability to safely operate a POV (scooter)?                                  []  Yes [x]  No Does patient need additional features/benefits provided by a power wheelchair for MRADLs in the home?       [x]  Yes []  No Does the patient demonstrate the ability to safely use a power wheelchair?                                                              [x]  Yes []  No  Therapist Name Printed: Tilda Burrow. Melrose Nakayama, PT, DPT Date: 04/04/2020  Therapists Signature:   Date:   Suppliers Name Printed: Luz Brazen, [employer] Date: 04/04/2020  Suppliers Signature:   Date:  Patient/Caregiver Signature:   Date:     This is to certify that I have read this evaluation and do agree with the content within:      Physicians Name Printed: [name], MD  Physicians Signature:  Date:     This is to certify that I, the above signed therapist have the following affiliations: []  This DME provider []  Manufacturer of recommended equipment []  Patients long term care facility [x]  None of the above       Objective measurements completed on  examination: See above findings.               PT Education - 04/04/20 1421    Education Details Educated on process for obtaining new power w/c    Person(s) Educated Patient    Methods Explanation    Comprehension Verbalized understanding                       Plan - 04/04/20 1422    Clinical Impression Statement Pt referred to outpatient neurorehab for evaluation for new power w/c.  Pt's evaluation revealed the following impairments and functional limitations: impaired ROM in bilat UE and LE, impaired strength in bilat UE and LE, chronic pain in bilat hips, knees and shoulders due to arthritis, impaired standing balance, inability to ambulate and impaired endurance.  Pt is dependent upon a power wheelchair for safe, independent mobility and ADL participation.  Pt's current wheelchair is >92 years old and pt would benefit from a new group 2 power wheelchair to maximize functional mobility independence and decrease falls risk.    Personal Factors and Comorbidities Comorbidity 3+;Fitness;Past/Current Experience;Social Background    Comorbidities anxiety, COPD, depression, DM, gout, HLD, HTN, leukocytosis, nonalcoholic steatohepatitis, OSA, psoriasis, RA, OA,  chronic pain in bilat knees, chronic hip pain, tobacco use disorder    Examination-Activity Limitations Bathing;Dressing;Hygiene/Grooming;Locomotion Level;Stand;Toileting;Transfers    Examination-Participation Restrictions Community Activity;Meal Prep;Shop    Stability/Clinical Decision Making Evolving/Moderate complexity    Clinical Decision Making Moderate    Rehab Potential Good    PT Frequency One time visit    PT Duration Other (comment)   one time visit for w/c evaluation   Consulted and Agree with Plan of Care Patient           Patient will benefit from skilled therapeutic intervention in order to improve the following deficits and impairments:  Cardiopulmonary status limiting activity,Decreased activity  tolerance,Decreased balance,Decreased endurance,Decreased mobility,Decreased range of motion,Decreased strength,Difficulty walking,Impaired UE functional use,Postural dysfunction,Pain  Visit Diagnosis: Muscle weakness (generalized)  Chronic left shoulder pain  Chronic right shoulder pain  Pain in left hip  Pain in right hip  Chronic pain of left knee  Chronic pain of right knee  Difficulty in walking, not elsewhere classified  Other symptoms and signs involving the musculoskeletal system     Problem List Patient Active Problem List   Diagnosis Date Noted   Encounter for power mobility device assessment 03/14/2020   Chronic hip pain, right 03/14/2020   Nocturia 03/14/2020   Erythrocytosis 07/04/2016   Psoriasis 12/27/2015   Leukocytosis 01/11/2014   Hernia of abdominal wall 12/28/2012   Abnormal laboratory test result 07/04/2011   Routine health maintenance 05/22/2011   NASH (nonalcoholic steatohepatitis), ? some EtOH contribution and fibrosis  05/17/2011   RA (rheumatoid arthritis) ([place]) 05/17/2011   Edema 04/19/2011   Cervical neuropathic pain 04/19/2011   Anxiety associated with depression 04/19/2011   Chronic prescription opiate use 04/19/2011   Class 2 obesity in adult    Type 2 diabetes mellitus with other specified complication ([place]) 36/14/4315    Class: Diagnosis of   COPD (chronic obstructive pulmonary disease) ([place]) 11/16/2008    Class: History of   Obstructive sleep apnea 08/21/2008   Hyperlipidemia 08/03/2008   Gout, unspecified 08/01/2008   Smoker 08/01/2008   HTN (hypertension) 08/01/2008    Rico Junker, PT, DPT 04/04/20    2:29 PM    [facility] [address] [place], [place], [address] Phone: [phone]   Fax:  [phone]  Name: SEON GAERTNER MRN: [id number] Date of Birth: 24-Jul-1960

## 2020-05-03 DIAGNOSIS — M25562 Pain in left knee: Secondary | ICD-10-CM | POA: Diagnosis not present

## 2020-05-03 DIAGNOSIS — M25551 Pain in right hip: Secondary | ICD-10-CM | POA: Diagnosis not present

## 2020-05-03 DIAGNOSIS — M25561 Pain in right knee: Secondary | ICD-10-CM | POA: Diagnosis not present

## 2020-05-15 ENCOUNTER — Telehealth: Payer: Self-pay

## 2020-05-15 NOTE — Telephone Encounter (Signed)
Requesting to speak with a nurse about hospital bed. Please call pt back.

## 2020-05-16 NOTE — Telephone Encounter (Signed)
I returned phone call to patient about the need for a hospital bed.The patient already has a hospital bed which he got 06-26-2017. The head of the bed he has a problem with the part that will not let it lower, it looks like it is cracked. This also affects lowering the bed. I have made a tele-health visit for the patient @3 :45 on Wednesday 05-17-2020.

## 2020-05-17 ENCOUNTER — Ambulatory Visit (INDEPENDENT_AMBULATORY_CARE_PROVIDER_SITE_OTHER): Payer: Medicaid Other | Admitting: Student

## 2020-05-17 ENCOUNTER — Other Ambulatory Visit: Payer: Self-pay

## 2020-05-17 ENCOUNTER — Encounter: Payer: Self-pay | Admitting: Student

## 2020-05-17 DIAGNOSIS — M5412 Radiculopathy, cervical region: Secondary | ICD-10-CM

## 2020-05-17 DIAGNOSIS — M25551 Pain in right hip: Secondary | ICD-10-CM | POA: Diagnosis not present

## 2020-05-17 DIAGNOSIS — M06019 Rheumatoid arthritis without rheumatoid factor, unspecified shoulder: Secondary | ICD-10-CM | POA: Diagnosis not present

## 2020-05-17 DIAGNOSIS — Z7689 Persons encountering health services in other specified circumstances: Secondary | ICD-10-CM | POA: Diagnosis not present

## 2020-05-17 DIAGNOSIS — G8929 Other chronic pain: Secondary | ICD-10-CM | POA: Diagnosis not present

## 2020-05-17 NOTE — Progress Notes (Signed)
[facility] Telephone Encounter Continuity Care Appointment  HPI:   This telephone encounter was created for [name] on 05/17/2020 for the following purpose/cc: hospital bed at home's motor is not functioning.  Concerned with the hospital bed in his home. The bed's motor that allows it to elevate the head of the bed is not working. States that a portion of the outer part of the motor has broken/split apart and is no longer allowing him to raise his head up and sit up. Patient is unsure as to how the motor broke, but he is having a lot of trouble with staying on it as he is unable to sit up and just has to stay lying flat.  It would be very helpful for the patient if the motor for the bed could be replaced with a new one so that he can raise the head of the bed. He states that he does not need an entire new bed as everything else works fine, he just needs a new motor for it.  Patient's bed was ordered because of chronic pain syndrome from rheumatoid arthritis, obesity, degenerative disc disease which requires for the patient to position his body in ways not feasible with an ordinary bed.  Discussed with patient that I will place a DME order for bed/motor repairs, but this may be a long process. He confirms understanding.   Past Medical History:  Past Medical History:  Diagnosis Date  . Abnormal laboratory test result 07/04/2011   Immunofixation electrophoresis 3/13 showed slightly restricted mobility in the IgG and kappa lanes and suggested repeat end of year.   Marland Kitchen Anxiety associated with depression 04/19/2011   On chronic benzos and SSRI's. Gets panic attacks. Does not see mental health.  . Chronic pain syndrome 04/19/2011   Combination of RA, obesity, OA, & DDD. Has seen Dr Sharol Given & s/p R total hip arthroplasty 2/2 OA & ? AVN. s/p L4-L5 laminectomy. Lumbar MRI 4/11 : L3-L4 mod central canal narrowing.  Cervical MRI 6/11 : multi-level DDD and L foraminal  stenosis at C4-5 and C5-6.   Marland Kitchen Chronic venous insufficiency 2013   ECHO 2013 was normal. LFT's, creatinine, TSH all normal. Alb a bit low.  Marland Kitchen COPD (chronic obstructive pulmonary disease) ([place])    Per pt, he has a diagnosis of COPD. No PFT's. Uses Alb MDI less than once a month.  . Diabetes mellitus    Non-insulin dependent Type II.  Marland Kitchen Elevated liver function tests 05/17/2011   04/2010. Increased GGT (pt uses ETOH). AMA negative. ABD U/S limited but otherwise negative. Follow Alk phos level. AST & ALT also elevated. False + IgM Hep B Core Ab. Hep B viral load negative.   Same pattern during hospitalization 2010 : highest alkaline phosphatase was 355, AST 259, and ALT 871. ANA, rheumatoid factor, ceruloplasmin, CMV IgM, alpha antitrypsin, and AMA, all within normal limits.  . Gout    Pt has never had a crystal diagnosis.  . Hepatitis B antibody positive 2013   IgM Hep B core Ab + but Hepatitis B viral load negative.   . Hyperlipidemia    At goal of LDL <100 with statin.  Marland Kitchen Hypertension    ACEI monotherapy  . NASH (nonalcoholic steatohepatitis), ? some EtOH contribution and fibrosis  05/17/2011   Elevated since 05/2008 during hospitalization. Relatively stable. Extensive W/U and no etiology but likely fatty liver 2/2 obesity despite normal imaging.  Hepatitis - all negative. There was an initial +  IgM Hep B core Ab but repeat testing was negative and Hep B viral load was negative.  TTG 06/15/2013  ANA x 2 (05/07/11 and 06/13/2008) ASMA x2 (07/18/2011 negative & weakly + at 21 on 05/27/2011 & nega  . OA (osteoarthritis)   . Obesity, Class III, BMI 40-49.9 (morbid obesity) ([place])   . Obstructive sleep apnea 08/21/08   Sleep study AHI 16.4 with desat to 66%. CPAP of 17 decreased AHI to 0.9. Non-compliant with CPAP.  . RA (rheumatoid arthritis) ([place]) 2013  . Shortness of breath   . Tobacco abuse    Per pt, he has a diagnosis of COPD. Need to locate PFT's.  . Wheelchair bound       ROS:     Assessment  / Plan / Recommendations:   Please see A&P under problem oriented charting for assessment of the patient's acute and chronic medical conditions.   As always, pt is advised that if symptoms worsen or new symptoms arise, they should go to an urgent care facility or to to ER for further evaluation.   Consent and Medical Decision Making:   Patient discussed with Dr. Angelia Mould  This is a telephone encounter between Andrew Fowler and Andrew Fowler on 05/17/2020 for hospital bed's motor not working so cannot elevate head of the bed. The visit was conducted with the patient located at home and Andrew Fowler at [facility]. The patient's identity was confirmed using their DOB and current address. The patient has consented to being evaluated through a telephone encounter and understands the associated risks (an examination cannot be done and the patient may need to come in for an appointment) / benefits (allows the patient to remain at home, decreasing exposure to coronavirus). I personally spent 10 minutes on medical discussion.

## 2020-05-18 NOTE — Telephone Encounter (Signed)
I have sent a Community Message to [facility] where patient got the bed 06-2017.

## 2020-05-23 ENCOUNTER — Ambulatory Visit: Payer: Medicaid Other | Admitting: *Deleted

## 2020-05-23 DIAGNOSIS — G4733 Obstructive sleep apnea (adult) (pediatric): Secondary | ICD-10-CM

## 2020-05-23 DIAGNOSIS — J449 Chronic obstructive pulmonary disease, unspecified: Secondary | ICD-10-CM

## 2020-05-23 DIAGNOSIS — M5412 Radiculopathy, cervical region: Secondary | ICD-10-CM

## 2020-05-23 DIAGNOSIS — I10 Essential (primary) hypertension: Secondary | ICD-10-CM

## 2020-05-23 DIAGNOSIS — Z7689 Persons encountering health services in other specified circumstances: Secondary | ICD-10-CM

## 2020-05-23 DIAGNOSIS — M06019 Rheumatoid arthritis without rheumatoid factor, unspecified shoulder: Secondary | ICD-10-CM

## 2020-05-23 NOTE — Chronic Care Management (AMB) (Signed)
   05/23/2020  Andrew Fowler 09/11/1960 [id number]  Completed documentation for power w/c successfully faxed to Andria Rhein at [facility] at [phone].  Plan:  Care plan completed and referral closed to CCM services.  Andrew Churn RN, CCM, [facility] RN Care Manager [phone]

## 2020-05-23 NOTE — Progress Notes (Signed)
Internal Medicine Clinic Attending  Case discussed with Dr. Jinwala  At the time of the visit.  We reviewed the resident's history and exam and pertinent patient test results.  I agree with the assessment, diagnosis, and plan of care documented in the resident's note.  

## 2020-05-23 NOTE — Progress Notes (Signed)
Internal Medicine Clinic Resident  I have personally reviewed this encounter including the documentation in this note and/or discussed this patient with the care management provider. I will address any urgent items identified by the care management provider and will communicate my actions to the patient's PCP. I have reviewed the patient's CCM visit with my supervising attending, Dr Philipp Ovens.  Mosetta Anis, MD 05/23/2020

## 2020-05-24 NOTE — Progress Notes (Signed)
Internal Medicine Clinic Attending  CCM services provided by the care management provider and their documentation were discussed with Dr. Truman Hayward. We reviewed the pertinent findings, urgent action items addressed by the resident and non-urgent items to be addressed by the PCP.  I agree with the assessment, diagnosis, and plan of care documented in the CCM and resident's note.  Velna Ochs, MD 05/24/2020

## 2020-05-26 ENCOUNTER — Telehealth: Payer: Self-pay | Admitting: *Deleted

## 2020-05-26 NOTE — Telephone Encounter (Signed)
I am not in today but will sign on Monday.

## 2020-05-26 NOTE — Telephone Encounter (Signed)
PT Eval for power w/c placed in PCP's box for signature.

## 2020-05-29 NOTE — Telephone Encounter (Signed)
Signed SWO and PT Seating Eval faxed to Andria Rhein at [facility].

## 2020-06-02 NOTE — Telephone Encounter (Signed)
Jacob Moores, Orvis Brill, RN; Barrington Ellison, RN  Yes ma'am. Thank you and I have sent them to Review for Submission to insurance. He will still have a wait to get approved and chair to get ordered but it has moved to the next step now.   We have had significant shipping delays from manufacturers for power wheelchairs so I just wanted to let you know that as well.   We are trying to communicate that delay to patients on every call.   Thanks so much for your help and follow up !!

## 2020-06-21 ENCOUNTER — Other Ambulatory Visit: Payer: Self-pay

## 2020-06-21 DIAGNOSIS — G894 Chronic pain syndrome: Secondary | ICD-10-CM

## 2020-06-21 MED ORDER — HYDROCODONE-ACETAMINOPHEN 10-325 MG PO TABS: 1.0000 | ORAL_TABLET | Freq: Four times a day (QID) | ORAL | 0 refills | Status: DC | PRN

## 2020-06-21 NOTE — Telephone Encounter (Signed)
Need refill on HYDROcodone-acetaminophen (NORCO) 10-325 MG tablet ;pt contact [phone]  [id number] [place] [place]

## 2020-07-13 DIAGNOSIS — I872 Venous insufficiency (chronic) (peripheral): Secondary | ICD-10-CM | POA: Insufficient documentation

## 2020-07-13 DIAGNOSIS — K761 Chronic passive congestion of liver: Secondary | ICD-10-CM | POA: Insufficient documentation

## 2020-07-13 DIAGNOSIS — G894 Chronic pain syndrome: Secondary | ICD-10-CM | POA: Insufficient documentation

## 2020-07-13 DIAGNOSIS — M1990 Unspecified osteoarthritis, unspecified site: Secondary | ICD-10-CM | POA: Insufficient documentation

## 2020-07-17 ENCOUNTER — Other Ambulatory Visit: Payer: Self-pay

## 2020-07-17 DIAGNOSIS — G894 Chronic pain syndrome: Secondary | ICD-10-CM

## 2020-07-17 MED ORDER — HYDROCODONE-ACETAMINOPHEN 10-325 MG PO TABS: 1.0000 | ORAL_TABLET | Freq: Four times a day (QID) | ORAL | 0 refills | Status: DC | PRN

## 2020-07-17 NOTE — Telephone Encounter (Signed)
Last rx written 06/21/20. Last OV 05/17/20. Next OV 07/25/20. UDS 03/14/20.

## 2020-07-17 NOTE — Telephone Encounter (Signed)
HYDROcodone-acetaminophen (NORCO) 10-325 MG tablet, [facility]  [id number]  [phone] Fax:  [phone]

## 2020-07-23 ENCOUNTER — Other Ambulatory Visit: Payer: Self-pay | Admitting: Student in an Organized Health Care Education/Training Program

## 2020-07-23 ENCOUNTER — Other Ambulatory Visit: Payer: Self-pay | Admitting: Internal Medicine

## 2020-07-23 DIAGNOSIS — J449 Chronic obstructive pulmonary disease, unspecified: Secondary | ICD-10-CM

## 2020-07-25 ENCOUNTER — Ambulatory Visit: Payer: Medicaid Other | Admitting: Internal Medicine

## 2020-07-25 VITALS — BP 113/75 | HR 83 | Temp 97.8°F | Ht 71.0 in | Wt 260.0 lb

## 2020-07-25 DIAGNOSIS — M25551 Pain in right hip: Secondary | ICD-10-CM | POA: Diagnosis not present

## 2020-07-25 DIAGNOSIS — R7401 Elevation of levels of liver transaminase levels: Secondary | ICD-10-CM | POA: Diagnosis not present

## 2020-07-25 DIAGNOSIS — M06019 Rheumatoid arthritis without rheumatoid factor, unspecified shoulder: Secondary | ICD-10-CM | POA: Diagnosis not present

## 2020-07-25 DIAGNOSIS — I10 Essential (primary) hypertension: Secondary | ICD-10-CM | POA: Diagnosis not present

## 2020-07-25 DIAGNOSIS — M25569 Pain in unspecified knee: Secondary | ICD-10-CM | POA: Diagnosis not present

## 2020-07-25 DIAGNOSIS — G8929 Other chronic pain: Secondary | ICD-10-CM | POA: Diagnosis not present

## 2020-07-25 DIAGNOSIS — E1169 Type 2 diabetes mellitus with other specified complication: Secondary | ICD-10-CM | POA: Diagnosis not present

## 2020-07-25 DIAGNOSIS — J449 Chronic obstructive pulmonary disease, unspecified: Secondary | ICD-10-CM | POA: Diagnosis not present

## 2020-07-25 DIAGNOSIS — G4733 Obstructive sleep apnea (adult) (pediatric): Secondary | ICD-10-CM | POA: Diagnosis not present

## 2020-07-25 DIAGNOSIS — Z0000 Encounter for general adult medical examination without abnormal findings: Secondary | ICD-10-CM

## 2020-07-25 DIAGNOSIS — F418 Other specified anxiety disorders: Secondary | ICD-10-CM

## 2020-07-25 DIAGNOSIS — F32A Depression, unspecified: Secondary | ICD-10-CM

## 2020-07-25 DIAGNOSIS — E871 Hypo-osmolality and hyponatremia: Secondary | ICD-10-CM

## 2020-07-25 DIAGNOSIS — Z7984 Long term (current) use of oral hypoglycemic drugs: Secondary | ICD-10-CM

## 2020-07-25 DIAGNOSIS — K7581 Nonalcoholic steatohepatitis (NASH): Secondary | ICD-10-CM | POA: Diagnosis not present

## 2020-07-25 DIAGNOSIS — Z79891 Long term (current) use of opiate analgesic: Secondary | ICD-10-CM

## 2020-07-25 LAB — POCT GLYCOSYLATED HEMOGLOBIN (HGB A1C): Hemoglobin A1C: 5.5 % (ref 4.0–5.6)

## 2020-07-25 LAB — GLUCOSE, CAPILLARY: Glucose-Capillary: 142 mg/dL — ABNORMAL HIGH (ref 70–99)

## 2020-07-25 NOTE — Assessment & Plan Note (Addendum)
BP Readings from Last 3 Encounters:  07/25/20 113/75  03/14/20 98/69  12/21/19 124/69    Lab Results  Component Value Date   NA 130 (L) 12/21/2019   K 4.3 12/21/2019   CREATININE 0.78 12/21/2019    Assessment: Blood pressure control:  Well-controlled Progress toward BP goal:   At goal Comments: Patient is compliant with lisinopril 10 mg daily  Plan: Medications:  continue current medications Educational resources provided:   Self management tools provided:   Other plans: We will check BMP today  Addendum: -Patient's creatinine remains within normal limits.  He does have mild hyponatremia with a sodium of 132. -No further work-up at this time.  We will continue to monitor closely -I called the patient and discussed results of his blood work with him.  He expresses understanding and is in agreement with plan.

## 2020-07-25 NOTE — Assessment & Plan Note (Signed)
-   This problem is chronic and stable -Patient states that he followed up with a new rheumatologist in [place] but would like to be referred to a rheumatologist that in [place].  Unfortunately, we are uncertain which rheumatologist in [place] would accept his insurance.  We will asked the front desk to look into this to see if we can transfer him to a [place] rheumatologist. -Patient was on Humira before but his new rheumatologist has not started him on Orencia.  Patient states that he has not had any flareups since his last visit -We will continue with Norco for pain as needed -No further work-up at this time

## 2020-07-25 NOTE — Assessment & Plan Note (Signed)
-   This problem is chronic and stable -Patient states that the pain medication does allow him to do some daily activities but he still has difficulty ambulating secondary to his right hip pain -We will continue with Norco 10/325 mg every 6 hours as needed -Currently the benefits of continuing opiates outweigh the risks -We will obtain UDS today for routine monitoring -PDMP reviewed and was appropriate

## 2020-07-25 NOTE — Assessment & Plan Note (Signed)
-   Patient obtained his first 2 doses of the COVID-vaccine but is due for a COVID booster.  I advised the patient to consider taking this vaccination as soon as possible especially given his multiple comorbidities making him high risk for severe COVID infections -Patient expressed understanding and is agreement with plan -Patient states that he has never had a colonoscopy and is unsure if he wants 1.  We will need to discuss this at his next visit -No further work-up at this time

## 2020-07-25 NOTE — Assessment & Plan Note (Signed)
-   Patient has chronic right hip pain and states that he went to see the orthopedic physician for this -He was told that he needed bilateral knee replacements as well as a right hip replacement.  However, the orthopedic physician also told him that he needed to lose weight and stop smoking prior to the surgery -Patient states that his knee pain is currently controlled after his recent cortisone shot.  Still has persistent right hip pain which limits him from standing and walking -Patient may benefit from referral to a pain management clinic where they are able to do hip injections as well as titrate his pain meds as needed.  We will consider discussing this with him at his next visit

## 2020-07-25 NOTE — Progress Notes (Signed)
   Subjective:    Patient ID: Andrew Fowler, male    DOB: October 08, 1960, 60 y.o.   MRN: [id number]  HPI  I have seen and examined this patient.  Patient is here for routine follow-up of his hypertension and diabetes.  Patient states that he still has some pain in his right hip as well as some pain in his knees but states that the pain in the knees have improved since his cortisone shots.  He denies any other complaints currently and states that he is compliant with all his medications.  Review of Systems  Constitutional: Negative.   HENT: Negative.   Respiratory: Negative.   Cardiovascular: Negative.   Gastrointestinal: Negative.   Musculoskeletal: Positive for arthralgias. Negative for joint swelling.  Neurological: Negative.   Psychiatric/Behavioral: Negative.        Objective:   Physical Exam Constitutional:      Appearance: Normal appearance.  HENT:     Head: Normocephalic and atraumatic.  Cardiovascular:     Rate and Rhythm: Normal rate and regular rhythm.     Heart sounds: Normal heart sounds.  Pulmonary:     Breath sounds: Wheezing present. No rales.     Comments: Patient noted to have bilateral scattered expiratory wheezes Abdominal:     General: Bowel sounds are normal. There is no distension.     Palpations: Abdomen is soft.     Tenderness: There is no abdominal tenderness.  Musculoskeletal:        General: No swelling or tenderness.  Neurological:     Mental Status: He is alert and oriented to person, place, and time.  Psychiatric:        Mood and Affect: Mood normal.        Behavior: Behavior normal.           Assessment & Plan:  Please see problem based charting for assessment and plan:

## 2020-07-25 NOTE — Assessment & Plan Note (Addendum)
-  This problem is chronic and uncontrolled -The etiology behind the patient's persistently elevated LFTs remains uncertain at this time -Patient states that he followed up with hematology recently and has another appointment coming up at the end of this month -We will recheck his LFTs today -Patient following up with hepatology for further evaluation and work-up  Addendum: -Patient noted to have persistently elevated LFTs with an alk phos of 426 as well as an AST and ALT of 54 and 95 respectively.  These have been relatively stable from his prior LFTs -Patient has an appointment with hepatology on the 19th and will follow up with them for further evaluation -I called the patient discussed results with him and he expressed understanding and is in agreement with plan.

## 2020-07-25 NOTE — Patient Instructions (Addendum)
-   It was a pleasure seeing you today -We will look into trying to find to a new rheumatologist in [facility] who takes your insurance -Please follow-up with your hematologist this month -We will work on try to get the transportation for sleep study -Please call me with any questions or concerns or if you need refills -We will discuss a possible colonoscopy at your next visit -Please follow-up with your ophthalmologist for an eye exam -Please get your COVID booster when you are able to -We will check some blood work today as well as a urine test

## 2020-07-25 NOTE — Assessment & Plan Note (Signed)
Lab Results  Component Value Date   HGBA1C 5.5 07/25/2020   HGBA1C 5.6 12/21/2019   HGBA1C 5.6 07/01/2019     Assessment: Diabetes control:  Well-controlled Progress toward A1C goal:   At goal Comments: Patient is compliant with metformin 1000 mg once daily  Plan: Medications:  continue current medications Home glucose monitoring: Frequency:   Timing:   Instruction/counseling given: reminded to get eye exam Educational resources provided:   Self management tools provided:   Other plans: We will consider transitioning to 500 mg of metformin once a day at his next visit

## 2020-07-25 NOTE — Assessment & Plan Note (Signed)
-   This problem is chronic and stable -Patient states that when he was initially prescribed Spiriva he was not using it correctly.  He states that he was taking it at different times the day and as needed.  He will read the instructions approximately 2 to 3 weeks after he initially started taking it and then start taking it regularly and noted improvement in his shortness of breath and wheezing. -We will continue with Spiriva daily as well as albuterol as needed -Patient still noted to have some intermittent wheezing on exam and may benefit from a combination LAMA/LABA inhaler -We will consider starting him on this at his next visit if he still has intermittent wheezing -No further work-up at this time

## 2020-07-25 NOTE — Assessment & Plan Note (Signed)
-   Patient does have a history of moderate OSA noted in 2014 -I have referred him to sleep study at his last visit but he states that he was not able to go as it is difficult for him to arrange transportation and he is also uncertain of the utility of this as he "sleeps fine". -I explained to the patient that OSA over the long-term can cause a lot of issues including uncontrolled hypertension and atrial fibrillation. -We will discuss with front office about possible transportation options for the patient is sleep study and back -We will reorder his sleep study once we figure out transportation to and from the study

## 2020-07-25 NOTE — Assessment & Plan Note (Signed)
-   This problem is chronic and stable -Patient's PHQ 2 score today is 0 -Patient states he takes his Xanax only as needed and has not needed it over the last 5 days. -We will check U tox today -I believe that the patient would benefit from initiation of an SSRI but we were unable to talk about this today as his transport was arriving.  We will initiate this discussion at his next visit

## 2020-07-26 LAB — CMP14 + ANION GAP
ALT: 95 IU/L — ABNORMAL HIGH (ref 0–44)
AST: 54 IU/L — ABNORMAL HIGH (ref 0–40)
Albumin/Globulin Ratio: 1.5 (ref 1.2–2.2)
Albumin: 4.1 g/dL (ref 3.8–4.9)
Alkaline Phosphatase: 426 IU/L — ABNORMAL HIGH (ref 44–121)
Anion Gap: 19 mmol/L — ABNORMAL HIGH (ref 10.0–18.0)
BUN/Creatinine Ratio: 15 (ref 9–20)
BUN: 11 mg/dL (ref 6–24)
Bilirubin Total: 0.5 mg/dL (ref 0.0–1.2)
CO2: 21 mmol/L (ref 20–29)
Calcium: 9.9 mg/dL (ref 8.7–10.2)
Chloride: 92 mmol/L — ABNORMAL LOW (ref 96–106)
Creatinine, Ser: 0.75 mg/dL — ABNORMAL LOW (ref 0.76–1.27)
Globulin, Total: 2.7 g/dL (ref 1.5–4.5)
Glucose: 101 mg/dL — ABNORMAL HIGH (ref 65–99)
Potassium: 4.1 mmol/L (ref 3.5–5.2)
Sodium: 132 mmol/L — ABNORMAL LOW (ref 134–144)
Total Protein: 6.8 g/dL (ref 6.0–8.5)
eGFR: 104 mL/min/{1.73_m2} (ref >59)

## 2020-08-03 LAB — TOXASSURE SELECT,+ANTIDEPR,UR

## 2020-08-10 DIAGNOSIS — R748 Abnormal levels of other serum enzymes: Secondary | ICD-10-CM | POA: Diagnosis not present

## 2020-08-16 ENCOUNTER — Other Ambulatory Visit: Payer: Self-pay

## 2020-08-16 ENCOUNTER — Telehealth: Payer: Self-pay | Admitting: *Deleted

## 2020-08-16 DIAGNOSIS — G894 Chronic pain syndrome: Secondary | ICD-10-CM

## 2020-08-16 MED ORDER — HYDROCODONE-ACETAMINOPHEN 10-325 MG PO TABS: 1.0000 | ORAL_TABLET | Freq: Four times a day (QID) | ORAL | 0 refills | Status: DC | PRN

## 2020-08-16 NOTE — Telephone Encounter (Addendum)
Information was sent through CoverMyMeds for PA for Hydrocodone Acetaminophen.  Awaiting determination.  Sander Nephew, RN 08/16/2020 4:18 PM. Notification from [employer] that additional information sent was not received.  Additional information was given for a new PA for the Hydrocodone Acetaminophen and records were refaxed to [phone].  Request # [id number].  Awaiting redetermination within 24 hours.  Sander Nephew, RN 08/17/2020 11:45 AM. Call to [facility]  PA for Hydrocodone-Acetaminophen was approved 08/17/2020 thru 02/13/2021.  Sander Nephew,  RN 08/18/2020 9:40 AM.

## 2020-08-16 NOTE — Telephone Encounter (Signed)
  HYDROcodone-acetaminophen (NORCO) 10-325 MG tablet, NEED PA.

## 2020-08-16 NOTE — Telephone Encounter (Signed)
HYDROcodone-acetaminophen (NORCO) 10-325 MG tablet, [facility]  [id number]  [phone] Fax:  [phone]

## 2020-08-16 NOTE — Telephone Encounter (Signed)
PA was sent in today.

## 2020-08-18 ENCOUNTER — Telehealth: Payer: Self-pay | Admitting: Internal Medicine

## 2020-08-18 NOTE — Telephone Encounter (Signed)
Pls contact pt regarding PA on medicine [phone]

## 2020-08-18 NOTE — Telephone Encounter (Signed)
PA was approved 08/17/2020 thru 02/13/2021.

## 2020-08-22 ENCOUNTER — Other Ambulatory Visit: Payer: Self-pay | Admitting: Internal Medicine

## 2020-08-22 NOTE — Telephone Encounter (Signed)
Need refill on ALPRAZolam (XANAX) 0.5 MG tablet ;pt contact [phone]  [id number] [place] [place] [facility] [address]

## 2020-08-24 ENCOUNTER — Other Ambulatory Visit: Payer: Self-pay | Admitting: Internal Medicine

## 2020-08-24 NOTE — Telephone Encounter (Signed)
  ALPRAZolam (XANAX) 0.5 MG tablet, refill request @  [id number] [place]:  [phone] Fax:  [phone]

## 2020-09-13 ENCOUNTER — Other Ambulatory Visit: Payer: Self-pay

## 2020-09-13 DIAGNOSIS — G894 Chronic pain syndrome: Secondary | ICD-10-CM

## 2020-09-13 MED ORDER — ALPRAZOLAM 0.5 MG PO TABS: 0.5000 mg | ORAL_TABLET | Freq: Two times a day (BID) | ORAL | 0 refills | Status: DC | PRN

## 2020-09-13 MED ORDER — HYDROCODONE-ACETAMINOPHEN 10-325 MG PO TABS: 1.0000 | ORAL_TABLET | Freq: Four times a day (QID) | ORAL | 0 refills | Status: DC | PRN

## 2020-09-13 NOTE — Telephone Encounter (Signed)
Pt is requesting his ALPRAZolam (XANAX) 0.5 MG tablet HYDROcodone-acetaminophen (NORCO) 10-325 MG tablet sent to  [id number] [place] [place]:  [phone] Fax:  [phone]

## 2020-10-16 ENCOUNTER — Other Ambulatory Visit: Payer: Self-pay | Admitting: *Deleted

## 2020-10-16 DIAGNOSIS — Z79891 Long term (current) use of opiate analgesic: Secondary | ICD-10-CM

## 2020-10-16 DIAGNOSIS — F418 Other specified anxiety disorders: Secondary | ICD-10-CM

## 2020-10-16 DIAGNOSIS — G894 Chronic pain syndrome: Secondary | ICD-10-CM

## 2020-10-16 MED ORDER — HYDROCODONE-ACETAMINOPHEN 10-325 MG PO TABS: 1.0000 | ORAL_TABLET | Freq: Four times a day (QID) | ORAL | 0 refills | Status: DC | PRN

## 2020-10-16 MED ORDER — ALPRAZOLAM 0.5 MG PO TABS: 0.5000 mg | ORAL_TABLET | Freq: Two times a day (BID) | ORAL | 3 refills | Status: DC | PRN

## 2020-10-16 NOTE — Telephone Encounter (Signed)
Patient requesting refill on pain meds and xanax. Also want the nurse to call him he has a question about something.

## 2020-10-16 NOTE — Telephone Encounter (Signed)
Last rx written 09/13/20. Last OV  07/25/20. Next OV  12/05/20. UDS  07/25/20  Pt is requesting 3 rxs for Hydrocodone so he does not have to call every month. Thanks

## 2020-10-16 NOTE — Addendum Note (Signed)
Addended by: [name] on: 10/16/2020 09:50 AM   Modules accepted: Orders

## 2020-10-17 ENCOUNTER — Telehealth: Payer: Self-pay | Admitting: Internal Medicine

## 2020-10-17 NOTE — Telephone Encounter (Signed)
RTC, patient states he is having transportation issues getting to his Rheumatologist in [place], states [employer] doesn't go out there, Medicaid transportation cancelled twice, and he cancelled once d/t illness. States he called Rheum- office and was told he needed to be seen before getting RX's.  He wants to know if PCP will send in RX's for: Prednisone 9m; 1 tab daily Hydroxychloroquine 2027m 1 tablet , twice daily.  RN advised patient to call his Rheum office back and try and schedule a telephone appt to obtain refills.  If he cannot, he was advised to r/s office visit.  He is also [facility] a [facility] He was advised RN will send request to [place], but he still needs to schedule f/u with current Rheumy and he verbalized understanding. SChaplin, RN,BSN

## 2020-10-17 NOTE — Telephone Encounter (Signed)
Refill Request- Pt requesting a call back about being able to get the following medication refilled as his transportation [facility]) has cancelled several times for hisr Rheumatologist Dr who normally refills this medication:  predniSONE ([place]) 5 MG tablet  [id number] [place]Ph: [phone]

## 2020-10-18 NOTE — Telephone Encounter (Signed)
I agree with him needing to follow up with current rheumatologist. If he continues to have issues will put in referral to rheum in [place]

## 2020-12-05 ENCOUNTER — Encounter: Payer: Medicaid Other | Admitting: Internal Medicine

## 2020-12-12 ENCOUNTER — Telehealth: Payer: Self-pay

## 2020-12-12 NOTE — Telephone Encounter (Signed)
ERROR

## 2020-12-19 ENCOUNTER — Telehealth: Payer: Self-pay

## 2020-12-19 NOTE — Telephone Encounter (Signed)
Called pt no answer, LVM for pt to call back regarding Scat Form. Scat form has been completed and ready for pick up.

## 2020-12-26 ENCOUNTER — Encounter: Payer: Self-pay | Admitting: Internal Medicine

## 2020-12-26 ENCOUNTER — Ambulatory Visit: Payer: Medicaid Other | Admitting: Internal Medicine

## 2020-12-26 ENCOUNTER — Encounter: Payer: Medicaid Other | Admitting: Internal Medicine

## 2020-12-26 VITALS — BP 115/75 | HR 90 | Temp 98.2°F | Ht 71.0 in

## 2020-12-26 DIAGNOSIS — I10 Essential (primary) hypertension: Secondary | ICD-10-CM | POA: Diagnosis present

## 2020-12-26 DIAGNOSIS — Z0000 Encounter for general adult medical examination without abnormal findings: Secondary | ICD-10-CM

## 2020-12-26 DIAGNOSIS — G8929 Other chronic pain: Secondary | ICD-10-CM

## 2020-12-26 DIAGNOSIS — F32A Depression, unspecified: Secondary | ICD-10-CM

## 2020-12-26 DIAGNOSIS — B351 Tinea unguium: Secondary | ICD-10-CM | POA: Diagnosis not present

## 2020-12-26 DIAGNOSIS — M25551 Pain in right hip: Secondary | ICD-10-CM

## 2020-12-26 DIAGNOSIS — Z79891 Long term (current) use of opiate analgesic: Secondary | ICD-10-CM | POA: Diagnosis not present

## 2020-12-26 DIAGNOSIS — E1169 Type 2 diabetes mellitus with other specified complication: Secondary | ICD-10-CM

## 2020-12-26 DIAGNOSIS — Z23 Encounter for immunization: Secondary | ICD-10-CM

## 2020-12-26 DIAGNOSIS — Z7984 Long term (current) use of oral hypoglycemic drugs: Secondary | ICD-10-CM | POA: Diagnosis not present

## 2020-12-26 DIAGNOSIS — F418 Other specified anxiety disorders: Secondary | ICD-10-CM

## 2020-12-26 DIAGNOSIS — J449 Chronic obstructive pulmonary disease, unspecified: Secondary | ICD-10-CM

## 2020-12-26 DIAGNOSIS — M06019 Rheumatoid arthritis without rheumatoid factor, unspecified shoulder: Secondary | ICD-10-CM

## 2020-12-26 DIAGNOSIS — F17210 Nicotine dependence, cigarettes, uncomplicated: Secondary | ICD-10-CM

## 2020-12-26 DIAGNOSIS — F17200 Nicotine dependence, unspecified, uncomplicated: Secondary | ICD-10-CM

## 2020-12-26 LAB — POCT GLYCOSYLATED HEMOGLOBIN (HGB A1C): Hemoglobin A1C: 5.3 % (ref 4.0–5.6)

## 2020-12-26 LAB — GLUCOSE, CAPILLARY: Glucose-Capillary: 114 mg/dL — ABNORMAL HIGH (ref 70–99)

## 2020-12-26 MED ORDER — PREDNISONE 5 MG PO TABS: 5.0000 mg | ORAL_TABLET | Freq: Every day | ORAL | 1 refills | Status: DC

## 2020-12-26 MED ORDER — STIOLTO RESPIMAT 2.5-2.5 MCG/ACT IN AERS: 2.0000 | INHALATION_SPRAY | Freq: Every day | RESPIRATORY_TRACT | 1 refills | Status: DC

## 2020-12-26 MED ORDER — LISINOPRIL 5 MG PO TABS: 5.0000 mg | ORAL_TABLET | Freq: Every day | ORAL | 1 refills | Status: DC

## 2020-12-26 MED ORDER — METFORMIN HCL 500 MG PO TABS: 500.0000 mg | ORAL_TABLET | Freq: Every day | ORAL | 1 refills | Status: DC

## 2020-12-26 NOTE — Patient Instructions (Signed)
-   It was a pleasure seeing you today - We gave you a flu shot today - I have referred you to the foot doctor for your toe nail - We will try and find a rheumatologist in [place] or [place] - Please get your covid booster - I have decreased the dose of your metformin to 500 mg and lisinopril to 5 mg - Please call me with any questions or concerns

## 2020-12-26 NOTE — Assessment & Plan Note (Signed)
-   Patient complained of difficulty with his toenail care especially in his left great toe -On exam, patient noted to have onychomycosis in his left great toe -We will refer the patient podiatry for debridement and treatment -No further work-up at this time

## 2020-12-26 NOTE — Assessment & Plan Note (Signed)
-   Patient used to smoke approximately 30 cigarettes a day and states that he is gradually cutting down and is now down to 17 to 18 cigarettes a day -He states that he is trying to quit but would like to do this on his own and is already showing some progress on this -We will follow-up with him at his next visit -No further work-up at this time

## 2020-12-26 NOTE — Assessment & Plan Note (Signed)
-   Flu shot given today -Patient encouraged to get COVID booster

## 2020-12-26 NOTE — Progress Notes (Signed)
   Subjective:    Patient ID: Andrew Fowler, male    DOB: 09/06/1960, 60 y.o.   MRN: [id number]    I have seen and examined this patient.  Patient is here for routine follow-up of his hypertension and diabetes.  Patient states that he does not want to follow-up with a rheumatologist in [place] and would like rheumatologist in [place] or [place].  He does complain of some stiffness in his fingers and states that the medications that he is currently on for his RA are not working.  He states that he is compliant with all his medications and denies any other complaints at this time   Review of Systems  Constitutional: Negative.   HENT: Negative.    Respiratory: Negative.    Cardiovascular: Negative.   Gastrointestinal: Negative.   Musculoskeletal:  Positive for arthralgias and gait problem.  Neurological:  Negative for syncope, weakness, light-headedness and headaches.  Psychiatric/Behavioral: Negative.        Objective:   Physical Exam Constitutional:      Appearance: Normal appearance.  HENT:     Head: Normocephalic and atraumatic.  Cardiovascular:     Rate and Rhythm: Normal rate and regular rhythm.     Heart sounds: Normal heart sounds.  Pulmonary:     Effort: Pulmonary effort is normal.     Breath sounds: Wheezing present. No rales.     Comments: Bilateral expiratory wheeze noted on exam Abdominal:     General: Bowel sounds are normal. There is no distension.     Palpations: Abdomen is soft.     Tenderness: There is no abdominal tenderness.  Musculoskeletal:        General: No swelling or tenderness.     Comments: Onychomycosis noted on left great toe  Neurological:     Mental Status: He is alert and oriented to person, place, and time.  Psychiatric:        Mood and Affect: Mood normal.        Behavior: Behavior normal.          Assessment & Plan:   Please see problem based charting for assessment and plan:

## 2020-12-26 NOTE — Assessment & Plan Note (Signed)
-   This problem is chronic and worsening -Patient complains of pain in the small joints of his hands as well as intermittent swelling in his left hand.  Patient also complains of stiffness of the small joints of his hand -He was seeing a rheumatologist in [place] and was prescribed prednisone 5 mg daily, hydroxychloroquine as well as Orencia weekly -Patient would like to see a rheumatologist in [place] or [place].  The office will try and get him in with a rheumatologist here -We will continue Norco for pain as needed -We will refill patient's prednisone 5 mg daily today -No further work-up at this time

## 2020-12-26 NOTE — Assessment & Plan Note (Signed)
-   This problem is chronic and stable -Patient does have history of RA as well as chronic right hip pain and cannot obtain surgery at this time secondary to his smoking and weight -Patient states the pain is well controlled with Norco at his current dose -I believe that the benefits of continued his medication outweigh the risks and allow the patient to do his daily activities.  We will continue with his current opiate medications for now

## 2020-12-26 NOTE — Assessment & Plan Note (Signed)
-   This problem is chronic and stable -Patient states that he has been taking his Spiriva as prescribed but still notes that he has some mild wheezing chronically -On exam today,Patient was noted to have bilateral expiratory wheeze -We will start the patient on a combination LAMA/LABA inhaler and DC Spiriva -No further work-up at this time

## 2020-12-26 NOTE — Assessment & Plan Note (Signed)
-   This problem is chronic and stable -Patient takes Xanax as needed for this and states that he needs to take it only occasionally -I explained to patient the adverse effects of benzodiazepines and the fact that it could be addictive -We discussed the importance of starting the patient on an SSRI and I would like to start him on an SSRI for this -However, patient had to leave to catch his ride and we will discuss this with him in more detail at his next visit

## 2020-12-26 NOTE — Assessment & Plan Note (Signed)
-   Patient states that he followed up with orthopedics for his right hip pain and was told that he needed to lose weight and stop smoking prior to surgery -He states that his pain is well controlled currently with his current pain regimen -We will continue with Norco as needed for now -No further work-up at this time

## 2020-12-26 NOTE — Assessment & Plan Note (Signed)
Lab Results  Component Value Date   HGBA1C 5.3 12/26/2020   HGBA1C 5.5 07/25/2020   HGBA1C 5.6 12/21/2019     Assessment: Diabetes control:  Well-controlled Progress toward A1C goal:   At goal Comments: Patient is compliant with metformin 1000 mg once daily.  Patient denies any hypoglycemic symptoms  Plan: Medications:  Will decrease metformin to 500 mg once daily Home glucose monitoring: Frequency:   Timing:   Instruction/counseling given: reminded to get eye exam and discussed foot care Educational resources provided:   Self management tools provided:   Other plans: No further work-up at this time

## 2020-12-26 NOTE — Assessment & Plan Note (Signed)
BP Readings from Last 3 Encounters:  12/26/20 115/75  07/25/20 113/75  03/14/20 98/69    Lab Results  Component Value Date   NA 132 (L) 07/25/2020   K 4.1 07/25/2020   CREATININE 0.75 (L) 07/25/2020    Assessment: Blood pressure control:  Well-controlled Progress toward BP goal:   At goal Comments: Patient is compliant with lisinopril 10 mg daily  Plan: Medications:  We will decrease lisinopril to 5 mg daily Educational resources provided:   Self management tools provided:   Other plans: Patient blood pressure is on the lower side with SBP in the 90s initially when he came to the clinic which improved to an SBP of 115 on repeat.  We will decrease his lisinopril to 5 mg daily.

## 2021-01-04 ENCOUNTER — Other Ambulatory Visit: Payer: Self-pay

## 2021-01-04 DIAGNOSIS — G894 Chronic pain syndrome: Secondary | ICD-10-CM

## 2021-01-04 MED ORDER — HYDROCODONE-ACETAMINOPHEN 10-325 MG PO TABS: 1.0000 | ORAL_TABLET | Freq: Four times a day (QID) | ORAL | 0 refills | Status: DC | PRN

## 2021-01-04 NOTE — Telephone Encounter (Signed)
HYDROcodone-acetaminophen (NORCO) 10-325 MG tablet, [id number] [place] [place] - [address]

## 2021-01-17 ENCOUNTER — Encounter: Payer: Medicaid Other | Admitting: Student

## 2021-02-02 ENCOUNTER — Other Ambulatory Visit: Payer: Self-pay

## 2021-02-02 DIAGNOSIS — G894 Chronic pain syndrome: Secondary | ICD-10-CM

## 2021-02-02 DIAGNOSIS — F418 Other specified anxiety disorders: Secondary | ICD-10-CM

## 2021-02-02 MED ORDER — ALPRAZOLAM 0.5 MG PO TABS: 0.5000 mg | ORAL_TABLET | Freq: Two times a day (BID) | ORAL | 0 refills | Status: DC | PRN

## 2021-02-02 MED ORDER — HYDROCODONE-ACETAMINOPHEN 10-325 MG PO TABS: 1.0000 | ORAL_TABLET | Freq: Four times a day (QID) | ORAL | 0 refills | Status: DC | PRN

## 2021-02-02 NOTE — Telephone Encounter (Signed)
ALPRAZolam (XANAX) 0.5 MG tablet,  HYDROcodone-acetaminophen (NORCO) 10-325 MG tablet, [id number] [place], [place] - [address]

## 2021-02-05 ENCOUNTER — Ambulatory Visit: Payer: Medicaid Other | Admitting: Podiatry

## 2021-02-05 ENCOUNTER — Other Ambulatory Visit: Payer: Self-pay

## 2021-02-05 ENCOUNTER — Other Ambulatory Visit: Payer: Self-pay | Admitting: *Deleted

## 2021-02-05 ENCOUNTER — Encounter: Payer: Self-pay | Admitting: Podiatry

## 2021-02-05 DIAGNOSIS — E1169 Type 2 diabetes mellitus with other specified complication: Secondary | ICD-10-CM

## 2021-02-05 DIAGNOSIS — B351 Tinea unguium: Secondary | ICD-10-CM | POA: Diagnosis not present

## 2021-02-05 DIAGNOSIS — M79674 Pain in right toe(s): Secondary | ICD-10-CM

## 2021-02-05 DIAGNOSIS — M79675 Pain in left toe(s): Secondary | ICD-10-CM

## 2021-02-05 DIAGNOSIS — R768 Other specified abnormal immunological findings in serum: Secondary | ICD-10-CM | POA: Insufficient documentation

## 2021-02-05 NOTE — Progress Notes (Signed)
Subjective:  Patient ID: Andrew Fowler, male    DOB: February 22, 1961,   MRN: [id number]  Chief Complaint  Patient presents with   Nail Problem    The two big toenails are thick and discolored and I am a diabetic and my toes are cold    60 y.o. male presents for concern of thickened elongated and painful toenails. Patient relates his toenails have been very difficult to trim. Relates he is a smoker and is diabetic. His last A1c was 5.6. Relates he is concerned about some red spots on his feet.  . Denies any other pedal complaints. Denies n/v/f/c.   Past Medical History:  Diagnosis Date   Abnormal laboratory test result 07/04/2011   Immunofixation electrophoresis 3/13 showed slightly restricted mobility in the IgG and kappa lanes and suggested repeat end of year.    Anxiety associated with depression 04/19/2011   On chronic benzos and SSRI's. Gets panic attacks. Does not see mental health.   Chronic pain syndrome 04/19/2011   Combination of RA, obesity, OA, & DDD. Has seen Dr Sharol Given & s/p R total hip arthroplasty 2/2 OA & ? AVN. s/p L4-L5 laminectomy. Lumbar MRI 4/11 : L3-L4 mod central canal narrowing.  Cervical MRI 6/11 : multi-level DDD and L foraminal stenosis at C4-5 and C5-6.    Chronic venous insufficiency 2013   ECHO 2013 was normal. LFT's, creatinine, TSH all normal. Alb a bit low.   COPD (chronic obstructive pulmonary disease) ([place])    Per pt, he has a diagnosis of COPD. No PFT's. Uses Alb MDI less than once a month.   Diabetes mellitus    Non-insulin dependent Type II.   Edema 04/19/2011   ECHO 2013 was normal. LFT's, creatinine, TSH all normal. Alb a bit low. Likely 2/2 chronic venous insuff 2/2 weight.   Elevated liver function tests 05/17/2011   04/2010. Increased GGT (pt uses ETOH). AMA negative. ABD U/S limited but otherwise negative. Follow Alk phos level. AST & ALT also elevated. False + IgM Hep B Core Ab. Hep B viral load negative.   Same pattern during hospitalization 2010 :  highest alkaline phosphatase was 355, AST 259, and ALT 871. ANA, rheumatoid factor, ceruloplasmin, CMV IgM, alpha antitrypsin, and AMA, all within normal limits.   Gout    Pt has never had a crystal diagnosis.   Hepatitis B antibody positive 2013   IgM Hep B core Ab + but Hepatitis B viral load negative.    Hyperlipidemia    At goal of LDL <100 with statin.   Hypertension    ACEI monotherapy   NASH (nonalcoholic steatohepatitis), ? some EtOH contribution and fibrosis  05/17/2011   Elevated since 05/2008 during hospitalization. Relatively stable. Extensive W/U and no etiology but likely fatty liver 2/2 obesity despite normal imaging.  Hepatitis - all negative. There was an initial + IgM Hep B core Ab but repeat testing was negative and Hep B viral load was negative.  TTG 06/15/2013  ANA x 2 (05/07/11 and 06/13/2008) ASMA x2 (07/18/2011 negative & weakly + at 21 on 05/27/2011 & nega   OA (osteoarthritis)    Obesity, Class III, BMI 40-49.9 (morbid obesity) ([place])    Obstructive sleep apnea 08/21/08   Sleep study AHI 16.4 with desat to 66%. CPAP of 17 decreased AHI to 0.9. Non-compliant with CPAP.   RA (rheumatoid arthritis) ([place]) 2013   Shortness of breath    Tobacco abuse    Per pt, he has a diagnosis of COPD.  Need to locate PFT's.   Wheelchair bound     Objective:  Physical Exam: Vascular: DP pulses 1/4 bilateral. PT 0/4 bilateral. CFT <5 seconds. Decreased hair growth and mild edema.  Skin. No lacerations or abrasions bilateral feet. Nails 1-5 are thickened discolored and elongated with subungual debris.  Musculoskeletal: MMT 5/5 bilateral lower extremities in DF, PF, Inversion and Eversion. Deceased ROM in DF of ankle joint.  Neurological: Sensation intact to light touch.   Assessment:   1. Type 2 diabetes mellitus with other specified complication, without long-term current use of insulin ([place])   2. Onychomycosis   3. Pain due to onychomycosis of toenails of both feet      Plan:   Patient was evaluated and treated and all questions answered. -Discussed and educated patient on diabetic foot care, especially with  regards to the vascular, neurological and musculoskeletal systems.  -Stressed the importance of good glycemic control and the detriment of not  controlling glucose levels in relation to the foot. -Discussed supportive shoes at all times and checking feet regularly.  -Mechanically debrided all nails 1-5 bilateral using sterile nail nipper and filed with dremel without incident  -Referral for ABIs placed due to decreased pulses and sluggish CFT.  -Answered all patient questions -Patient to return  in 3 months for at risk foot care -Patient advised to call the office if any problems or questions arise in the meantime.   Lorenda Peck, DPM

## 2021-02-20 DIAGNOSIS — J449 Chronic obstructive pulmonary disease, unspecified: Secondary | ICD-10-CM | POA: Diagnosis not present

## 2021-02-20 DIAGNOSIS — G894 Chronic pain syndrome: Secondary | ICD-10-CM | POA: Diagnosis not present

## 2021-02-20 DIAGNOSIS — M0549 Rheumatoid myopathy with rheumatoid arthritis of multiple sites: Secondary | ICD-10-CM | POA: Diagnosis not present

## 2021-02-20 DIAGNOSIS — E139 Other specified diabetes mellitus without complications: Secondary | ICD-10-CM | POA: Diagnosis not present

## 2021-03-01 ENCOUNTER — Other Ambulatory Visit: Payer: Self-pay

## 2021-03-01 ENCOUNTER — Encounter (HOSPITAL_COMMUNITY): Payer: Medicaid Other

## 2021-03-01 DIAGNOSIS — G894 Chronic pain syndrome: Secondary | ICD-10-CM

## 2021-03-01 DIAGNOSIS — F418 Other specified anxiety disorders: Secondary | ICD-10-CM

## 2021-03-01 MED ORDER — ALPRAZOLAM 0.5 MG PO TABS: 0.5000 mg | ORAL_TABLET | Freq: Two times a day (BID) | ORAL | 0 refills | Status: DC | PRN

## 2021-03-01 MED ORDER — HYDROCODONE-ACETAMINOPHEN 10-325 MG PO TABS: 1.0000 | ORAL_TABLET | Freq: Four times a day (QID) | ORAL | 0 refills | Status: DC | PRN

## 2021-03-01 NOTE — Telephone Encounter (Signed)
Last Office visit was 12/26/2020 .  No scheduled appointment.  Last Toxassure was 07/25/2020.

## 2021-03-01 NOTE — Telephone Encounter (Signed)
ALPRAZolam (XANAX) 0.5 MG tablet  HYDROcodone-acetaminophen (NORCO) 10-325 MG tablet, [facility] request @  [id number] [place], [place] - [address]

## 2021-03-05 ENCOUNTER — Telehealth: Payer: Self-pay

## 2021-03-05 NOTE — Telephone Encounter (Signed)
DECISION :   Approved today  PA Case: [id number],  Status: Approved,   Coverage Starts on: 03/05/2021 12:00:00 AM, Coverage Ends on: 09/01/2021 12:00:00 AM.  Drug  HYDROcodone-Acetaminophen 10-325MG tablets  Form  [facility] [facility] Electronic [place] Form [phone] NCPDP) Original Claim Info    ([place] )

## 2021-03-05 NOTE — Telephone Encounter (Signed)
PA  for pt ( HYDROCODONE-ACETAMINOPHEN 10-325 MG TAB )   came through on cover my meds was done and submitted along with office notes from 10/4 . Awaiting approval or denial

## 2021-03-05 NOTE — Telephone Encounter (Signed)
Requesting PA on HYDROcodone-acetaminophen (NORCO) 10-325 MG tablet.

## 2021-03-08 ENCOUNTER — Other Ambulatory Visit (HOSPITAL_COMMUNITY): Payer: Self-pay | Admitting: Podiatry

## 2021-03-08 DIAGNOSIS — I739 Peripheral vascular disease, unspecified: Secondary | ICD-10-CM

## 2021-03-09 ENCOUNTER — Ambulatory Visit (HOSPITAL_COMMUNITY)
Admission: RE | Admit: 2021-03-09 | Payer: Medicaid Other | Source: Ambulatory Visit | Attending: Podiatry | Admitting: Podiatry

## 2021-03-09 ENCOUNTER — Ambulatory Visit: Payer: Medicaid Other | Admitting: Cardiovascular Disease

## 2021-03-14 DIAGNOSIS — M25561 Pain in right knee: Secondary | ICD-10-CM | POA: Diagnosis not present

## 2021-03-14 DIAGNOSIS — M059 Rheumatoid arthritis with rheumatoid factor, unspecified: Secondary | ICD-10-CM | POA: Diagnosis not present

## 2021-03-14 DIAGNOSIS — K760 Fatty (change of) liver, not elsewhere classified: Secondary | ICD-10-CM | POA: Diagnosis not present

## 2021-03-14 DIAGNOSIS — Z79899 Other long term (current) drug therapy: Secondary | ICD-10-CM | POA: Diagnosis not present

## 2021-03-14 DIAGNOSIS — K7581 Nonalcoholic steatohepatitis (NASH): Secondary | ICD-10-CM | POA: Diagnosis not present

## 2021-03-14 DIAGNOSIS — R7989 Other specified abnormal findings of blood chemistry: Secondary | ICD-10-CM | POA: Diagnosis not present

## 2021-03-14 DIAGNOSIS — M0579 Rheumatoid arthritis with rheumatoid factor of multiple sites without organ or systems involvement: Secondary | ICD-10-CM | POA: Diagnosis not present

## 2021-03-14 DIAGNOSIS — R945 Abnormal results of liver function studies: Secondary | ICD-10-CM | POA: Diagnosis not present

## 2021-03-14 DIAGNOSIS — G8929 Other chronic pain: Secondary | ICD-10-CM | POA: Diagnosis not present

## 2021-03-14 DIAGNOSIS — M170 Bilateral primary osteoarthritis of knee: Secondary | ICD-10-CM | POA: Diagnosis not present

## 2021-04-03 ENCOUNTER — Other Ambulatory Visit: Payer: Self-pay

## 2021-04-03 DIAGNOSIS — F418 Other specified anxiety disorders: Secondary | ICD-10-CM

## 2021-04-03 DIAGNOSIS — G894 Chronic pain syndrome: Secondary | ICD-10-CM

## 2021-04-03 MED ORDER — HYDROCODONE-ACETAMINOPHEN 10-325 MG PO TABS: 1.0000 | ORAL_TABLET | Freq: Four times a day (QID) | ORAL | 0 refills | Status: DC | PRN

## 2021-04-03 MED ORDER — ALPRAZOLAM 0.5 MG PO TABS: 0.5000 mg | ORAL_TABLET | Freq: Two times a day (BID) | ORAL | 0 refills | Status: DC | PRN

## 2021-04-03 NOTE — Telephone Encounter (Signed)
Chart reviewed as PCP, Dr. Dareen Piano, is on PAL. Last visit, 12/26/20, decision made to continue current opioid dose as well as Xanax as needed for anxiety until follow-up visit. F/u scheduled 06/19/21. PDMP reviewed and appropriate. UDS will be due at next visit. Prescriptions resent for one month.

## 2021-04-03 NOTE — Telephone Encounter (Signed)
Last rx written 03/01/21. Last OV 12/26/20. Next OV 06/19/21 with PCP. UDS 07/25/20.

## 2021-04-03 NOTE — Telephone Encounter (Signed)
ALPRAZolam (XANAX) 0.5 MG tablet  HYDROcodone-acetaminophen (NORCO) 10-325 MG tablet, [id number] [place], [place] - [address]

## 2021-04-11 ENCOUNTER — Ambulatory Visit (HOSPITAL_COMMUNITY)
Admission: RE | Admit: 2021-04-11 | Payer: Medicaid Other | Source: Ambulatory Visit | Attending: Podiatry | Admitting: Podiatry

## 2021-04-16 ENCOUNTER — Other Ambulatory Visit: Payer: Self-pay

## 2021-04-16 DIAGNOSIS — J449 Chronic obstructive pulmonary disease, unspecified: Secondary | ICD-10-CM

## 2021-04-16 MED ORDER — STIOLTO RESPIMAT 2.5-2.5 MCG/ACT IN AERS: 2.0000 | INHALATION_SPRAY | Freq: Every day | RESPIRATORY_TRACT | 1 refills | Status: DC

## 2021-04-16 NOTE — Telephone Encounter (Signed)
Tiotropium Bromide-Olodaterol (STIOLTO RESPIMAT) 2.5-2.5 MCG/ACT AERS,refill request @ [id number] [place], [place] - [address]

## 2021-04-25 ENCOUNTER — Ambulatory Visit: Payer: Medicaid Other | Admitting: Cardiovascular Disease

## 2021-05-01 ENCOUNTER — Other Ambulatory Visit: Payer: Self-pay

## 2021-05-01 DIAGNOSIS — F418 Other specified anxiety disorders: Secondary | ICD-10-CM

## 2021-05-01 DIAGNOSIS — G894 Chronic pain syndrome: Secondary | ICD-10-CM

## 2021-05-01 MED ORDER — ALPRAZOLAM 0.5 MG PO TABS: 0.5000 mg | ORAL_TABLET | Freq: Two times a day (BID) | ORAL | 0 refills | Status: DC | PRN

## 2021-05-01 MED ORDER — HYDROCODONE-ACETAMINOPHEN 10-325 MG PO TABS: 1.0000 | ORAL_TABLET | Freq: Four times a day (QID) | ORAL | 0 refills | Status: DC | PRN

## 2021-05-09 ENCOUNTER — Ambulatory Visit: Payer: Medicaid Other | Admitting: Podiatry

## 2021-05-29 ENCOUNTER — Other Ambulatory Visit: Payer: Self-pay

## 2021-05-29 DIAGNOSIS — F418 Other specified anxiety disorders: Secondary | ICD-10-CM

## 2021-05-29 DIAGNOSIS — G894 Chronic pain syndrome: Secondary | ICD-10-CM

## 2021-05-29 MED ORDER — ALPRAZOLAM 0.5 MG PO TABS: 0.5000 mg | ORAL_TABLET | Freq: Two times a day (BID) | ORAL | 0 refills | Status: DC | PRN

## 2021-05-29 MED ORDER — HYDROCODONE-ACETAMINOPHEN 10-325 MG PO TABS: 1.0000 | ORAL_TABLET | Freq: Four times a day (QID) | ORAL | 0 refills | Status: DC | PRN

## 2021-05-29 NOTE — Telephone Encounter (Signed)
Last rx written 05/01/21. ?Last OV 12/26/21. ?Next OV 06/19/21. ?UDS 07/25/21. ? ?

## 2021-05-29 NOTE — Telephone Encounter (Signed)
ALPRAZolam (XANAX) 0.5 MG tablet, ? ?HYDROcodone-acetaminophen (NORCO) 10-325 MG tablet, REFILL REQUEST @ [id number] [place], [place] - [address] ?

## 2021-06-01 ENCOUNTER — Ambulatory Visit: Payer: Medicaid Other | Admitting: Cardiovascular Disease

## 2021-06-16 ENCOUNTER — Other Ambulatory Visit: Payer: Self-pay | Admitting: Internal Medicine

## 2021-06-16 DIAGNOSIS — E1169 Type 2 diabetes mellitus with other specified complication: Secondary | ICD-10-CM

## 2021-06-19 ENCOUNTER — Ambulatory Visit (INDEPENDENT_AMBULATORY_CARE_PROVIDER_SITE_OTHER): Payer: Medicaid Other | Admitting: Internal Medicine

## 2021-06-19 ENCOUNTER — Encounter: Payer: Self-pay | Admitting: Internal Medicine

## 2021-06-19 DIAGNOSIS — I10 Essential (primary) hypertension: Secondary | ICD-10-CM | POA: Diagnosis not present

## 2021-06-19 DIAGNOSIS — F418 Other specified anxiety disorders: Secondary | ICD-10-CM | POA: Diagnosis present

## 2021-06-19 DIAGNOSIS — Z0000 Encounter for general adult medical examination without abnormal findings: Secondary | ICD-10-CM

## 2021-06-19 DIAGNOSIS — M06019 Rheumatoid arthritis without rheumatoid factor, unspecified shoulder: Secondary | ICD-10-CM | POA: Diagnosis not present

## 2021-06-19 DIAGNOSIS — E785 Hyperlipidemia, unspecified: Secondary | ICD-10-CM | POA: Diagnosis not present

## 2021-06-19 DIAGNOSIS — R7989 Other specified abnormal findings of blood chemistry: Secondary | ICD-10-CM

## 2021-06-19 DIAGNOSIS — Z720 Tobacco use: Secondary | ICD-10-CM | POA: Diagnosis not present

## 2021-06-19 DIAGNOSIS — E1169 Type 2 diabetes mellitus with other specified complication: Secondary | ICD-10-CM

## 2021-06-19 DIAGNOSIS — J449 Chronic obstructive pulmonary disease, unspecified: Secondary | ICD-10-CM

## 2021-06-19 DIAGNOSIS — Z79891 Long term (current) use of opiate analgesic: Secondary | ICD-10-CM

## 2021-06-19 MED ORDER — SERTRALINE HCL 25 MG PO TABS: 25.0000 mg | ORAL_TABLET | Freq: Every day | ORAL | 2 refills | Status: DC

## 2021-06-19 MED ORDER — METFORMIN HCL 500 MG PO TABS: ORAL_TABLET | ORAL | 1 refills | Status: DC

## 2021-06-19 MED ORDER — STIOLTO RESPIMAT 2.5-2.5 MCG/ACT IN AERS: 2.0000 | INHALATION_SPRAY | Freq: Every day | RESPIRATORY_TRACT | 1 refills | Status: DC

## 2021-06-19 MED ORDER — LISINOPRIL 5 MG PO TABS: 5.0000 mg | ORAL_TABLET | Freq: Every day | ORAL | 1 refills | Status: DC

## 2021-06-19 NOTE — Assessment & Plan Note (Signed)
-   Patient has had persistently elevated LFTs including a mildly elevated AST and ALT as well as an elevated alkaline phosphatase (last level in the 400s) ?-Patient has been following up with GI for this and etiology behind this remains uncertain at this time ?-Patient was referred to cardiology to rule out cardiac etiology of hepatic venous congestion.  Patient states that he will follow-up with cardiology once his pain is under better control ?-We will continue to monitor closely and repeat his CMP at his next visit ?

## 2021-06-19 NOTE — Assessment & Plan Note (Signed)
-   Patient is trying to decrease amount of cigarettes that he smokes and is currently still smoking 17 cigarettes a day ?-I explained to patient the importance of trying to stop smoking and he is in agreement.  He states that he is slowly trying to wean himself off cigarettes and is down from 2 packs a day ?-We will follow-up with him at his next appointment ?

## 2021-06-19 NOTE — Progress Notes (Signed)
? ?  Subjective:  ? ? Patient ID: Andrew Fowler, male    DOB: 07-Sep-1960, 61 y.o.   MRN: [id number] ? ?This is a telephone encounter between [employer] and [employer] on 06/19/2021 for HTN/COPD follow up. The visit was conducted with the patient located at home and [name] at [facility]. The patient's identity was confirmed using their DOB and current address. The patient has consented to being evaluated through a telephone encounter and understands the associated risks (an examination cannot be done and the patient may need to come in for an appointment) / benefits (allows the patient to remain at home, decreasing exposure to coronavirus). I personally spent 20 minutes on medical discussion.  ? ? ?HPI ? ?I called this patient for his telephone appointment.  Patient states that he is having worsening pain secondary to his uncontrolled rheumatoid arthritis as well as his chronic right hip pain.  Patient states that the pain is controlled with his Norco and that he is working with his rheumatologist to find a medication that works for him.  He states that his blood sugars and blood pressure have been well controlled at home but has not measured his blood pressure recently.  He denies any other complaints and states that he is compliant with all his medications. ? ?Review of Systems  ?Constitutional: Negative.   ?HENT: Negative.    ?Respiratory: Negative.    ?Cardiovascular: Negative.   ?Gastrointestinal: Negative.   ?Musculoskeletal:  Positive for arthralgias.  ?Neurological: Negative.   ?Psychiatric/Behavioral: Negative.    ? ?   ?Objective:  ? Physical Exam ? ?Unable to perform physical exam as this was a telephone visit ? ? ?   ?Assessment & Plan:  ? ?Please see problem based charting for assessment and plan: ?

## 2021-06-19 NOTE — Assessment & Plan Note (Signed)
-   This problem is chronic and stable ?-Patient said he was taking his blood pressure at home but has not done this recently.  He states that when he was taking it his blood pressure was well controlled at home. ?-At his last visit his lisinopril was decreased from 10 mg to 5 mg secondary to borderline hypotension. ?-Patient to follow-up with me in 1 month for repeat blood pressure check and blood work. ?-No further work-up at this time ? ?

## 2021-06-19 NOTE — Assessment & Plan Note (Signed)
-   This problem is chronic and stable ?-Patient is currently on atorvastatin 40 mg daily ?-We will recheck a lipid panel at his next visit ?-No further work-up at this time ?

## 2021-06-19 NOTE — Assessment & Plan Note (Addendum)
-   This problem is chronic and stable ?-Patient states that he normally only needs to take 1 Xanax a day but rarely needs to take 2 ?-I explained to the patient that long-term benzodiazepine is not appropriate therapy for his anxiety and that benzodiazepines can become addictive as well as can have worsened side effects as you age like increased falls ?-We will start the patient on sertraline.  I explained to the patient that we will maintain him on his benzodiazepines for now but as the sertraline starts to work we will hopefully be able to titrate him down and hopefully off his benzodiazepines ?-Patient initially expressed hesitation but after further discussion he is in agreement with the plan ?-Patient was told that he would take approximately 3 to 4 weeks for him to see an effect from the medication and that he should continue the medication till he follows up with me in a month. ?-Sertraline 25 mg was called into his pharmacy ?

## 2021-06-19 NOTE — Assessment & Plan Note (Signed)
-   This problem is chronic and improving ?-Patient states that on his Stiolto Respimat inhaler that his breathing is improved.  He states that he coughs immediately after taking it but his shortness of breath and wheezing are significantly improved. ?-We will continue patient on Stiolto for now ?-Refill called in today ?-We will have patient follow-up with me for in person appointment in 1 month ?-No further work-up at this time ?

## 2021-06-19 NOTE — Assessment & Plan Note (Signed)
-   This problem is chronic and stable ?-Patient does have a history of RA as well as chronic right hip pain for which she cannot obtain surgery at this time secondary to his continued smoking and weight ?-We will continue with Norco at current dose as it helps him do his daily activities and the benefits of this medication currently outweigh the risks ?-No further work-up at this time ?

## 2021-06-19 NOTE — Assessment & Plan Note (Signed)
-   This problem is chronic and worsening ?-Patient states that his RA has been uncontrolled.  He is following up with a new rheumatologist in [facility] and states that he feels this rheumatologist is trying to help him ?-Patient was started on Enbrel but states that he developed blisters in his hands and diffuse pruritus ?-Enbrel was then discontinued and patient is now approved for Humira.  Patient was unaware that his Humira was approved and will call his rheumatologist to obtain his medication ?-No further work-up at this time ?-We will continue to monitor closely ?

## 2021-06-19 NOTE — Assessment & Plan Note (Signed)
-   Patient needs Tdap, colon cancer screening as well as shingles vaccine ?-We will discuss with him at his next visit ?

## 2021-06-19 NOTE — Assessment & Plan Note (Signed)
-   This problem is chronic and stable ?-Patient last A1c was 5.3 and his metformin was decreased to 500 mg once daily ?-Patient states that he has been checking his blood sugars at home and they have been running in the 90s and he has not had any hypoglycemic episodes ?-We will have patient follow-up with me next month and repeat his A1c, do foot exam and repeat lipid panel ?-We will consider stopping his metformin if his A1c remains low ?-No further work-up at this time ?

## 2021-06-26 ENCOUNTER — Other Ambulatory Visit: Payer: Self-pay

## 2021-06-26 DIAGNOSIS — G894 Chronic pain syndrome: Secondary | ICD-10-CM

## 2021-06-26 DIAGNOSIS — F418 Other specified anxiety disorders: Secondary | ICD-10-CM

## 2021-06-26 MED ORDER — ALPRAZOLAM 0.5 MG PO TABS: 0.5000 mg | ORAL_TABLET | Freq: Two times a day (BID) | ORAL | 0 refills | Status: DC | PRN

## 2021-06-26 MED ORDER — HYDROCODONE-ACETAMINOPHEN 10-325 MG PO TABS: 1.0000 | ORAL_TABLET | Freq: Four times a day (QID) | ORAL | 0 refills | Status: DC | PRN

## 2021-06-26 NOTE — Telephone Encounter (Signed)
Last rx written 3/37/22. ?Last OV 06/19/21. ?Next OV 07/09/21 with Dr Alfonse Spruce. ?UDS 07/25/20. ? ?

## 2021-06-26 NOTE — Telephone Encounter (Signed)
ALPRAZolam (XANAX) 0.5 MG tablet, ? ?HYDROcodone-acetaminophen (NORCO) 10-325 MG tablet, [id number] [place] [place] - [address] ?

## 2021-07-09 ENCOUNTER — Encounter: Payer: Self-pay | Admitting: Student

## 2021-07-09 ENCOUNTER — Ambulatory Visit: Payer: Medicaid Other | Admitting: Student

## 2021-07-09 VITALS — BP 106/69 | HR 90 | Temp 97.7°F | Ht 71.0 in

## 2021-07-09 DIAGNOSIS — Z0000 Encounter for general adult medical examination without abnormal findings: Secondary | ICD-10-CM

## 2021-07-09 DIAGNOSIS — F418 Other specified anxiety disorders: Secondary | ICD-10-CM | POA: Diagnosis present

## 2021-07-09 DIAGNOSIS — Z720 Tobacco use: Secondary | ICD-10-CM | POA: Diagnosis not present

## 2021-07-09 DIAGNOSIS — M06019 Rheumatoid arthritis without rheumatoid factor, unspecified shoulder: Secondary | ICD-10-CM

## 2021-07-09 DIAGNOSIS — E1169 Type 2 diabetes mellitus with other specified complication: Secondary | ICD-10-CM | POA: Diagnosis not present

## 2021-07-09 DIAGNOSIS — E785 Hyperlipidemia, unspecified: Secondary | ICD-10-CM | POA: Diagnosis not present

## 2021-07-09 DIAGNOSIS — I10 Essential (primary) hypertension: Secondary | ICD-10-CM

## 2021-07-09 DIAGNOSIS — Z1211 Encounter for screening for malignant neoplasm of colon: Secondary | ICD-10-CM

## 2021-07-09 LAB — POCT GLYCOSYLATED HEMOGLOBIN (HGB A1C): Hemoglobin A1C: 5.4 % (ref 4.0–5.6)

## 2021-07-09 LAB — GLUCOSE, CAPILLARY: Glucose-Capillary: 87 mg/dL (ref 70–99)

## 2021-07-09 MED ORDER — VARENICLINE TARTRATE 0.5 MG PO TABS: 0.5000 mg | ORAL_TABLET | Freq: Two times a day (BID) | ORAL | 2 refills | Status: AC

## 2021-07-09 NOTE — Patient Instructions (Addendum)
Andrew Fowler, ? ?It was a pleasure seeing you in the clinic today.  Here is a summary what we talked about: ? ?1.  Your blood pressure is low today.  I will stop lisinopril.  Please keep up with hydration and food. ? ?2.  Please continue Norco for your joint pain.  Make sure you follow-up with your rheumatologist as scheduled. ? ?3.  Since you are not taking sertraline, I will take it off of your medication list.  We talked about the risk of Xanax.  Dr. Dareen Piano will continue this discussion at next visit. ? ?4.  I ordered Chantix for your smoking.  Please take 1 tablet daily for 3 days then increase to 2 tablets daily.  I also order a CT scan for lung cancer screening. ? ?Please return in 3 months with Dr. Dareen Piano, his PCP.  ? ?Take care ? ?Dr. Alfonse Spruce ?

## 2021-07-09 NOTE — Assessment & Plan Note (Signed)
-  CT low-dose lung cancer screening ordered ?- Patient would like FOBT for colon cancer screening.  Unfortunately he has not picked up the kit. ?

## 2021-07-09 NOTE — Assessment & Plan Note (Addendum)
Her last several A1c's have been in the normal range.  Patient report adherence to metformin.  He is also taking low-dose 5 mg prednisone daily for his rheumatoid arthritis. ? ?-Recheck A1c this time.  If low, will stop metformin. ?-Patient will call to make an appointment with his eye doctor this month ?-Check BMP and microalbumin ? ?Addendum  ?A1C 5.5. Will stop Metformin to reduce pill burden.  ? ?Normal kidney function on BMP. Incidental finding of AGMA, suspect lab errors. He has been on Metformin for years so it should not cause this. He denies any symptoms. He will see his rheumatologist tomorrow so I will reach out to his provider for repeat BMP. ? ?Urine microalbumin slightly elevated at 76.  Lisinopril was held due to hypotension.  If blood pressure improves next visit, will consider resuming ACE/ARB. ?

## 2021-07-09 NOTE — Assessment & Plan Note (Signed)
Patient has cut back from 2 pack a day to 1 pack a day.  He has been smoking for more than 20 years.  He also smokes marijuana as well.  He has tried nicotine gum and patch without help.  Said that Chantix helped in the past but does not know why he is not on it anymore. ? ?-Counseled patient on the risk of tobacco use including lung cancer and COPD ?-Resume Chantix ?-Order CT low-dose lung cancer screening ?

## 2021-07-09 NOTE — Assessment & Plan Note (Signed)
His blood pressure is on the lower side today.  Patient has not eaten anything this morning.  He report adherence to lisinopril 5 mg.  Recheck blood pressure showed slight improvement but still on the lower side. ? ?-Will stop lisinopril 5 mg for now to avoid hypotension ?-Check BMP and urine microalbumin today ?

## 2021-07-09 NOTE — Assessment & Plan Note (Signed)
Patient was started on sertraline 4 weeks ago to hopefully titrate down Xanax.  Patient told me upfront that he will not get off of Xanax.  He only took 2 days of sertraline and stopped.  Said that he take Xanax from half a tablet to 2 tablets a day depends on his attacks. ? ?I counseled him on the risk of chronic benzo including overdose, withdrawal symptoms, dependency and sedating side effects.  Patient verbalized understanding that he has been on Xanax for a long time without any issue. ? ?-Since patient not taking sertraline, will discontinue ?-Continue Xanax ?-Follow-up with PCP for ongoing discussion ?

## 2021-07-09 NOTE — Assessment & Plan Note (Signed)
He has long history of rheumatoid arthritis and multiple joint pains.  He is currently taking chronic Norco which helped with his pain.  He was on Enbrel in the past but has many side effects including pruritus and blisters.  He was started on Humira 1 shot/2 weeks by Dr. Tamera Punt with rheumatology in December.  He will follow-up with him this week.  Patient denies any side effects from Humira.  He also on 5 mg of prednisone daily.  Reports severe morning stiffness and limitation of mobility due to joint pains. ? ?No signs of active RA seen on physical exam.  No swelling, erythema or tenderness to palpation bilateral wrists or knee joints. ? ?-Continue Norco ?-Continue Humira ?-Follow-up with rheumatology as scheduled ?

## 2021-07-09 NOTE — Progress Notes (Signed)
? ?CC: F/u on anxiety and sertraline ? ?HPI: ? ?[name] is a 61 y.o. with past medical history of RA, hypertension, type 2 diabetes, anxiety who presents to the clinic for 4 weeks follow-up to reassess sertraline response. ? ?Please see problem based charting for detail ? ?Past Medical History:  ?Diagnosis Date  ? Abnormal laboratory test result 07/04/2011  ? Immunofixation electrophoresis 3/13 showed slightly restricted mobility in the IgG and kappa lanes and suggested repeat end of year.   ? Anxiety associated with depression 04/19/2011  ? On chronic benzos and SSRI's. Gets panic attacks. Does not see mental health.  ? Chronic pain syndrome 04/19/2011  ? Combination of RA, obesity, OA, & DDD. Has seen Dr Sharol Given & s/p R total hip arthroplasty 2/2 OA & ? AVN. s/p L4-L5 laminectomy. Lumbar MRI 4/11 : L3-L4 mod central canal narrowing.  Cervical MRI 6/11 : multi-level DDD and L foraminal stenosis at C4-5 and C5-6.   ? Chronic venous insufficiency 2013  ? ECHO 2013 was normal. LFT's, creatinine, TSH all normal. Alb a bit low.  ? COPD (chronic obstructive pulmonary disease) ([place])   ? Per pt, he has a diagnosis of COPD. No PFT's. Uses Alb MDI less than once a month.  ? Diabetes mellitus   ? Non-insulin dependent Type II.  ? Edema 04/19/2011  ? ECHO 2013 was normal. LFT's, creatinine, TSH all normal. Alb a bit low. Likely 2/2 chronic venous insuff 2/2 weight.  ? Elevated liver function tests 05/17/2011  ? 04/2010. Increased GGT (pt uses ETOH). AMA negative. ABD U/S limited but otherwise negative. Follow Alk phos level. AST & ALT also elevated. False + IgM Hep B Core Ab. Hep B viral load negative.   Same pattern during hospitalization 2010 : highest alkaline phosphatase was 355, AST 259, and ALT 871. ANA, rheumatoid factor, ceruloplasmin, CMV IgM, alpha antitrypsin, and AMA, all within normal limits.  ? Encounter for power mobility device assessment 03/14/2020  ? Erythrocytosis 07/04/2016  ? Gout   ? Pt has never had a  crystal diagnosis.  ? Hepatitis B antibody positive 2013  ? IgM Hep B core Ab + but Hepatitis B viral load negative.   ? Hyperlipidemia   ? At goal of LDL <100 with statin.  ? Hypertension   ? ACEI monotherapy  ? Leukocytosis 01/11/2014  ? Since 2010. Smear 2015 mild left shift. Is on prednisone for RA but increase started before leukocytosis.   ? NASH (nonalcoholic steatohepatitis), ? some EtOH contribution and fibrosis  05/17/2011  ? Elevated since 05/2008 during hospitalization. Relatively stable. Extensive W/U and no etiology but likely fatty liver 2/2 obesity despite normal imaging.  Hepatitis - all negative. There was an initial + IgM Hep B core Ab but repeat testing was negative and Hep B viral load was negative.  TTG 06/15/2013  ANA x 2 (05/07/11 and 06/13/2008) ASMA x2 (07/18/2011 negative & weakly + at 21 on 05/27/2011 & nega  ? OA (osteoarthritis)   ? Obesity, Class III, BMI 40-49.9 (morbid obesity) ([place])   ? Obstructive sleep apnea 08/21/08  ? Sleep study AHI 16.4 with desat to 66%. CPAP of 17 decreased AHI to 0.9. Non-compliant with CPAP.  ? RA (rheumatoid arthritis) ([place]) 2013  ? Shortness of breath   ? Tobacco abuse   ? Per pt, he has a diagnosis of COPD. Need to locate PFT's.  ? Wheelchair bound   ? ?Review of Systems:  per [employer] ? ?Physical Exam: ? ?Vitals:  ?  07/09/21 1057 07/09/21 1133  ?BP: (!) 96/55 106/69  ?Pulse: 92 90  ?Temp: 97.7 ?F (36.5 ?C)   ?TempSrc: Oral   ?SpO2: 96%   ?Height: _0  (1.803 m)   ? ?Physical Exam ?Constitutional:   ?   General: He is not in acute distress. ?HENT:  ?   Head: Normocephalic.  ?Eyes:  ?   General:     ?   Right eye: No discharge.     ?   Left eye: No discharge.  ?   Conjunctiva/sclera: Conjunctivae normal.  ?Cardiovascular:  ?   Rate and Rhythm: Normal rate and regular rhythm.  ?Pulmonary:  ?   Effort: Pulmonary effort is normal. No respiratory distress.  ?   Breath sounds: Normal breath sounds.  ?Abdominal:  ?   General: Bowel sounds are normal.  ?   Palpations:  Abdomen is soft.  ?Musculoskeletal:  ?   Comments: No swelling of bilateral wrist joint or knee joint.  No erythema or tenderness to palpation.  The dorsal surface of his right hand appears slightly hyperpigmented compared to the left side.  ?Skin: ?   General: Skin is warm.  ?Neurological:  ?   Mental Status: He is alert.  ?Psychiatric:     ?   Mood and Affect: Mood normal.  ?  ? ?Assessment & Plan:  ? ?See Encounters Tab for problem based charting. ? ?HTN (hypertension) ?His blood pressure is on the lower side today.  Patient has not eaten anything this morning.  He report adherence to lisinopril 5 mg.  Recheck blood pressure showed slight improvement but still on the lower side. ? ?-Will stop lisinopril 5 mg for now to avoid hypotension ?-Check BMP and urine microalbumin today ? ?Type 2 diabetes mellitus with other specified complication ([place]) ?Her last several A1c's have been in the normal range.  Patient report adherence to metformin.  He is also taking low-dose 5 mg prednisone daily for his rheumatoid arthritis. ? ?-Recheck A1c this time.  If low, will stop metformin. ?-Patient will call to make an appointment with his eye doctor this month ?-Check BMP and microalbumin ? ?Tobacco abuse ?Patient has cut back from 2 pack a day to 1 pack a day.  He has been smoking for more than 20 years.  He also smokes marijuana as well.  He has tried nicotine gum and patch without help.  Said that Chantix helped in the past but does not know why he is not on it anymore. ? ?-Counseled patient on the risk of tobacco use including lung cancer and COPD ?-Resume Chantix ?-Order CT low-dose lung cancer screening ? ?RA (rheumatoid arthritis) ([place]) ?He has long history of rheumatoid arthritis and multiple joint pains.  He is currently taking chronic Norco which helped with his pain.  He was on Enbrel in the past but has many side effects including pruritus and blisters.  He was started on Humira 1 shot/2 weeks by Dr. Tamera Punt with  rheumatology in December.  He will follow-up with him this week.  Patient denies any side effects from Humira.  He also on 5 mg of prednisone daily.  Reports severe morning stiffness and limitation of mobility due to joint pains. ? ?No signs of active RA seen on physical exam.  No swelling, erythema or tenderness to palpation bilateral wrists or knee joints. ? ?-Continue Norco ?-Continue Humira ?-Follow-up with rheumatology as scheduled ? ?Hyperlipidemia ?He is taking atorvastatin for primary prevention and diabetes ? ?-Repeat lipid panel.  Goal LDL less than 100 ? ?Anxiety associated with depression ?Patient was started on sertraline 4 weeks ago to hopefully titrate down Xanax.  Patient told me upfront that he will not get off of Xanax.  He only took 2 days of sertraline and stopped.  Said that he take Xanax from half a tablet to 2 tablets a day depends on his attacks. ? ?I counseled him on the risk of chronic benzo including overdose, withdrawal symptoms, dependency and sedating side effects.  Patient verbalized understanding that he has been on Xanax for a long time without any issue. ? ?-Since patient not taking sertraline, will discontinue ?-Continue Xanax ?-Follow-up with PCP for ongoing discussion ? ?Routine health maintenance ?- CT low-dose lung cancer screening ordered ?- Patient would like FOBT for colon cancer screening.  Unfortunately he has not picked up the kit.  ? ?Patient discussed with Dr. Dareen Piano  ?

## 2021-07-09 NOTE — Assessment & Plan Note (Addendum)
He is taking atorvastatin for primary prevention and diabetes ? ?-Repeat lipid panel.  Goal LDL less than 100 ? ?Addendum ?LDL at goal  ?

## 2021-07-10 LAB — BMP8+ANION GAP
Anion Gap: 20 mmol/L — ABNORMAL HIGH (ref 10.0–18.0)
BUN/Creatinine Ratio: 17 (ref 10–24)
BUN: 15 mg/dL (ref 8–27)
CO2: 16 mmol/L — ABNORMAL LOW (ref 20–29)
Calcium: 9.4 mg/dL (ref 8.6–10.2)
Chloride: 96 mmol/L (ref 96–106)
Creatinine, Ser: 0.9 mg/dL (ref 0.76–1.27)
Glucose: 84 mg/dL (ref 70–99)
Potassium: 4.1 mmol/L (ref 3.5–5.2)
Sodium: 132 mmol/L — ABNORMAL LOW (ref 134–144)
eGFR: 98 mL/min/{1.73_m2} (ref >59)

## 2021-07-10 LAB — LIPID PANEL
Chol/HDL Ratio: 2.6 ratio (ref 0.0–5.0)
Cholesterol, Total: 159 mg/dL (ref 100–199)
HDL: 62 mg/dL (ref >39)
LDL Chol Calc (NIH): 73 mg/dL (ref 0–99)
Triglycerides: 137 mg/dL (ref 0–149)
VLDL Cholesterol Cal: 24 mg/dL (ref 5–40)

## 2021-07-10 LAB — MICROALBUMIN / CREATININE URINE RATIO
Creatinine, Urine: 232 mg/dL
Microalb/Creat Ratio: 76 mg/g creat — ABNORMAL HIGH (ref 0–29)
Microalbumin, Urine: 175.2 ug/mL

## 2021-07-11 DIAGNOSIS — M25511 Pain in right shoulder: Secondary | ICD-10-CM | POA: Diagnosis not present

## 2021-07-11 DIAGNOSIS — M0579 Rheumatoid arthritis with rheumatoid factor of multiple sites without organ or systems involvement: Secondary | ICD-10-CM | POA: Diagnosis not present

## 2021-07-11 DIAGNOSIS — R7989 Other specified abnormal findings of blood chemistry: Secondary | ICD-10-CM | POA: Diagnosis not present

## 2021-07-11 DIAGNOSIS — M170 Bilateral primary osteoarthritis of knee: Secondary | ICD-10-CM | POA: Diagnosis not present

## 2021-07-11 DIAGNOSIS — G8929 Other chronic pain: Secondary | ICD-10-CM | POA: Diagnosis not present

## 2021-07-11 DIAGNOSIS — M059 Rheumatoid arthritis with rheumatoid factor, unspecified: Secondary | ICD-10-CM | POA: Diagnosis not present

## 2021-07-11 DIAGNOSIS — Z79899 Other long term (current) drug therapy: Secondary | ICD-10-CM | POA: Diagnosis not present

## 2021-07-11 DIAGNOSIS — K7581 Nonalcoholic steatohepatitis (NASH): Secondary | ICD-10-CM | POA: Diagnosis not present

## 2021-07-11 DIAGNOSIS — Z7952 Long term (current) use of systemic steroids: Secondary | ICD-10-CM | POA: Diagnosis not present

## 2021-07-11 DIAGNOSIS — Z79620 Long term (current) use of immunosuppressive biologic: Secondary | ICD-10-CM | POA: Diagnosis not present

## 2021-07-11 DIAGNOSIS — R945 Abnormal results of liver function studies: Secondary | ICD-10-CM | POA: Diagnosis not present

## 2021-07-11 NOTE — Progress Notes (Signed)
Internal Medicine Clinic Attending  Case discussed with Dr. Nguyen  At the time of the visit.  We reviewed the resident's history and exam and pertinent patient test results.  I agree with the assessment, diagnosis, and plan of care documented in the resident's note. 

## 2021-07-24 ENCOUNTER — Other Ambulatory Visit: Payer: Self-pay | Admitting: Internal Medicine

## 2021-07-24 DIAGNOSIS — G894 Chronic pain syndrome: Secondary | ICD-10-CM

## 2021-07-24 DIAGNOSIS — F418 Other specified anxiety disorders: Secondary | ICD-10-CM

## 2021-07-24 MED ORDER — ALPRAZOLAM 0.5 MG PO TABS: 0.5000 mg | ORAL_TABLET | Freq: Two times a day (BID) | ORAL | 0 refills | Status: DC | PRN

## 2021-07-24 MED ORDER — HYDROCODONE-ACETAMINOPHEN 10-325 MG PO TABS: 1.0000 | ORAL_TABLET | Freq: Four times a day (QID) | ORAL | 0 refills | Status: DC | PRN

## 2021-07-24 NOTE — Telephone Encounter (Signed)
Last visit 07/09/2021.  Last ToxAssure 07/25/2020. ?

## 2021-07-24 NOTE — Telephone Encounter (Signed)
Refill Request ? ?HYDROcodone-acetaminophen (NORCO) 10-325 MG tablet ? ? ?ALPRAZolam (XANAX) 0.5 MG tablet ? ? ?[id number] [place] [place] [facility] [address] ? ?Additional Information ? ?

## 2021-08-17 ENCOUNTER — Other Ambulatory Visit: Payer: Self-pay

## 2021-08-17 DIAGNOSIS — F418 Other specified anxiety disorders: Secondary | ICD-10-CM

## 2021-08-17 DIAGNOSIS — G894 Chronic pain syndrome: Secondary | ICD-10-CM

## 2021-08-18 MED ORDER — ALPRAZOLAM 0.5 MG PO TABS: 0.5000 mg | ORAL_TABLET | Freq: Two times a day (BID) | ORAL | 0 refills | Status: DC | PRN

## 2021-08-18 MED ORDER — HYDROCODONE-ACETAMINOPHEN 10-325 MG PO TABS: 1.0000 | ORAL_TABLET | Freq: Four times a day (QID) | ORAL | 0 refills | Status: DC | PRN

## 2021-08-27 ENCOUNTER — Other Ambulatory Visit: Payer: Self-pay | Admitting: Internal Medicine

## 2021-08-29 ENCOUNTER — Other Ambulatory Visit: Payer: Self-pay | Admitting: Internal Medicine

## 2021-08-29 DIAGNOSIS — J449 Chronic obstructive pulmonary disease, unspecified: Secondary | ICD-10-CM

## 2021-09-17 ENCOUNTER — Ambulatory Visit (HOSPITAL_COMMUNITY): Admission: RE | Admit: 2021-09-17 | Payer: Medicaid Other | Source: Ambulatory Visit

## 2021-09-17 ENCOUNTER — Other Ambulatory Visit: Payer: Self-pay

## 2021-09-17 DIAGNOSIS — F418 Other specified anxiety disorders: Secondary | ICD-10-CM

## 2021-09-17 DIAGNOSIS — G894 Chronic pain syndrome: Secondary | ICD-10-CM

## 2021-09-18 MED ORDER — ALPRAZOLAM 0.5 MG PO TABS: 0.5000 mg | ORAL_TABLET | Freq: Two times a day (BID) | ORAL | 0 refills | Status: DC | PRN

## 2021-09-18 MED ORDER — HYDROCODONE-ACETAMINOPHEN 10-325 MG PO TABS: 1.0000 | ORAL_TABLET | Freq: Four times a day (QID) | ORAL | 0 refills | Status: DC | PRN

## 2021-09-19 ENCOUNTER — Other Ambulatory Visit: Payer: Self-pay

## 2021-09-19 ENCOUNTER — Telehealth: Payer: Self-pay

## 2021-09-19 NOTE — Telephone Encounter (Signed)
Pa for pt ( HYDRO - ACET )  came through on cover my meds was submitted with last office notes ... Awaiting approval or denial

## 2021-09-20 NOTE — Telephone Encounter (Signed)
DECISION :    Approved on June 28  PA Case: [id number], Status: Approved,   Coverage Starts on: 09/19/2021 12:00:00 AM, Coverage Ends on: 03/18/2022 12:00:00 AM.   Drug HYDROcodone-Acetaminophen 10-325MG tablets       ( COPY SENT TO PHARMACY ALSO )

## 2021-10-10 ENCOUNTER — Other Ambulatory Visit: Payer: Self-pay

## 2021-10-10 DIAGNOSIS — F17200 Nicotine dependence, unspecified, uncomplicated: Secondary | ICD-10-CM

## 2021-10-10 MED ORDER — ALBUTEROL SULFATE HFA 108 (90 BASE) MCG/ACT IN AERS: 1.0000 | INHALATION_SPRAY | RESPIRATORY_TRACT | 0 refills | Status: DC | PRN

## 2021-10-15 ENCOUNTER — Other Ambulatory Visit: Payer: Self-pay

## 2021-10-15 DIAGNOSIS — F418 Other specified anxiety disorders: Secondary | ICD-10-CM

## 2021-10-15 DIAGNOSIS — G894 Chronic pain syndrome: Secondary | ICD-10-CM

## 2021-10-15 MED ORDER — HYDROCODONE-ACETAMINOPHEN 10-325 MG PO TABS: 1.0000 | ORAL_TABLET | Freq: Four times a day (QID) | ORAL | 0 refills | Status: DC | PRN

## 2021-10-15 MED ORDER — ALPRAZOLAM 0.5 MG PO TABS: 0.5000 mg | ORAL_TABLET | Freq: Two times a day (BID) | ORAL | 0 refills | Status: DC | PRN

## 2021-10-15 NOTE — Telephone Encounter (Signed)
ToxAssure 07/25/2020.  Last visit 07/09/2021

## 2021-10-15 NOTE — Telephone Encounter (Signed)
ALPRAZolam (XANAX) 0.5 MG tablet,  HYDROcodone-acetaminophen (NORCO) 10-325 MG tablet, [id number] [place] - [address]

## 2021-11-01 ENCOUNTER — Encounter: Payer: Medicaid Other | Admitting: Internal Medicine

## 2021-11-13 ENCOUNTER — Other Ambulatory Visit: Payer: Self-pay

## 2021-11-13 DIAGNOSIS — G894 Chronic pain syndrome: Secondary | ICD-10-CM

## 2021-11-13 DIAGNOSIS — F418 Other specified anxiety disorders: Secondary | ICD-10-CM

## 2021-11-13 MED ORDER — HYDROCODONE-ACETAMINOPHEN 10-325 MG PO TABS: 1.0000 | ORAL_TABLET | Freq: Four times a day (QID) | ORAL | 0 refills | Status: DC | PRN

## 2021-11-13 MED ORDER — ALPRAZOLAM 0.5 MG PO TABS: 0.5000 mg | ORAL_TABLET | Freq: Two times a day (BID) | ORAL | 0 refills | Status: DC | PRN

## 2021-11-13 NOTE — Telephone Encounter (Signed)
Last rx written  10/15/21. Last OV  07/09/21. Next OV  11/20/21. Tox  07/25/20.

## 2021-11-13 NOTE — Telephone Encounter (Signed)
HYDROcodone-acetaminophen (NORCO) 10-325 MG tablet  ALPRAZolam (XANAX) 0.5 MG tablet, [facility] request [id number] [place], [place] - [address]

## 2021-11-20 ENCOUNTER — Ambulatory Visit (INDEPENDENT_AMBULATORY_CARE_PROVIDER_SITE_OTHER): Payer: Medicaid Other | Admitting: Internal Medicine

## 2021-11-20 DIAGNOSIS — R197 Diarrhea, unspecified: Secondary | ICD-10-CM | POA: Insufficient documentation

## 2021-11-20 NOTE — Assessment & Plan Note (Signed)
I called the patient as he was scheduled to have an appointment today but was unable to make it.  Patient states that he has not been feeling well.  He had 2 episodes of loose stool yesterday as well as one episode this morning.  He also had some chills and a low-grade fever (Tmax 99 F).  He is able to tolerate liquids and is staying hydrated.  I suspect that this is likely a viral illness. He does not have much of an appetite.  I did instruct him to try a BRAT diet and to keep himself hydrated.  I instructed him to follow-up urgently if he has worsening diarrhea, new onset nausea vomiting with inability to take oral intake, blood in stools, worsening fevers or abdominal pain.  Patient states that he will call us if he feels worse.  We will schedule follow-up appointment with me to address his chronic medical issues.

## 2021-11-20 NOTE — Progress Notes (Signed)
I called the patient as he was scheduled to have an appointment today but was unable to make it.  Patient states that he has not been feeling well.  He had 2 episodes of loose stool yesterday as well as one episode this morning.  He also had some chills and a low-grade fever (Tmax 99 F).  He is able to tolerate liquids and is staying hydrated.  He does not have much of an appetite.  I did instruct him to try a BRAT diet and to keep himself hydrated.  I instructed him to follow-up urgently if he has worsening diarrhea, new onset nausea vomiting with inability to take oral intake, blood in stools, worsening fevers or abdominal pain.  Patient states that he will call us if he feels worse.  We will schedule follow-up appointment with me to address his chronic medical issues.

## 2021-12-07 ENCOUNTER — Telehealth: Payer: Self-pay

## 2021-12-07 NOTE — Telephone Encounter (Signed)
Pt is requesting  a call back .Marland Kitchen He is wanting to speak to someone who handles DME orders

## 2021-12-07 NOTE — Telephone Encounter (Signed)
There is an order in Epic from 05/17/20 for repairs to hospital bed. Call placed to patient. States he received a new bed at that time, however motor housing broke last night and he cannot raise HOB. CM sent to [place] at [place].

## 2021-12-11 ENCOUNTER — Other Ambulatory Visit: Payer: Self-pay

## 2021-12-11 DIAGNOSIS — G894 Chronic pain syndrome: Secondary | ICD-10-CM

## 2021-12-11 DIAGNOSIS — F418 Other specified anxiety disorders: Secondary | ICD-10-CM

## 2021-12-11 MED ORDER — ALPRAZOLAM 0.5 MG PO TABS: 0.5000 mg | ORAL_TABLET | Freq: Two times a day (BID) | ORAL | 0 refills | Status: DC | PRN

## 2021-12-11 MED ORDER — HYDROCODONE-ACETAMINOPHEN 10-325 MG PO TABS: 1.0000 | ORAL_TABLET | Freq: Four times a day (QID) | ORAL | 0 refills | Status: DC | PRN

## 2021-12-11 NOTE — Telephone Encounter (Signed)
Patient calling again for hospital bed repair. No response from [employer]. Have sent second message to [place] at [place], [place].

## 2021-12-11 NOTE — Telephone Encounter (Signed)
ALPRAZolam (XANAX) 0.5 MG tablet,  HYDROcodone-acetaminophen (NORCO) 10-325 MG tablet, REFILL REQUEST @ [id number] ,  - [address]

## 2021-12-11 NOTE — Telephone Encounter (Signed)
Riki Sheer, Orvis Brill, RN; [place], [place], [place]; [place], [place]; Stephannie Peters; [place], [place]; 1 other  We will get someone out there to take a look at it

## 2021-12-11 NOTE — Telephone Encounter (Signed)
Patient notified that someone from [place] will most likely call to schedule appt to have someone look at his bed. Advised not to screen calls. He is very Patent attorney.

## 2021-12-12 ENCOUNTER — Ambulatory Visit (INDEPENDENT_AMBULATORY_CARE_PROVIDER_SITE_OTHER): Payer: Medicaid Other | Admitting: Internal Medicine

## 2021-12-12 DIAGNOSIS — F17210 Nicotine dependence, cigarettes, uncomplicated: Secondary | ICD-10-CM

## 2021-12-12 DIAGNOSIS — Z6836 Body mass index (BMI) 36.0-36.9, adult: Secondary | ICD-10-CM

## 2021-12-12 DIAGNOSIS — Z720 Tobacco use: Secondary | ICD-10-CM

## 2021-12-12 DIAGNOSIS — E669 Obesity, unspecified: Secondary | ICD-10-CM

## 2021-12-12 DIAGNOSIS — M170 Bilateral primary osteoarthritis of knee: Secondary | ICD-10-CM | POA: Diagnosis not present

## 2021-12-12 DIAGNOSIS — M06019 Rheumatoid arthritis without rheumatoid factor, unspecified shoulder: Secondary | ICD-10-CM | POA: Diagnosis not present

## 2021-12-12 NOTE — Progress Notes (Unsigned)
[facility] Telephone Encounter Continuity Care Appointment  HPI:  This telephone encounter was created for [name] on 12/13/2021 for the following purpose/cc evaluation of pt continued need for a hospital bed. PMHx of RA, OA, psoriasis, HTN, T2DM, OSA.    Past Medical History:  Past Medical History:  Diagnosis Date   Abnormal laboratory test result 07/04/2011   Immunofixation electrophoresis 3/13 showed slightly restricted mobility in the IgG and kappa lanes and suggested repeat end of year.    Anxiety associated with depression 04/19/2011   On chronic benzos and SSRI's. Gets panic attacks. Does not see mental health.   Chronic pain syndrome 04/19/2011   Combination of RA, obesity, OA, & DDD. Has seen Dr Sharol Given & s/p R total hip arthroplasty 2/2 OA & ? AVN. s/p L4-L5 laminectomy. Lumbar MRI 4/11 : L3-L4 mod central canal narrowing.  Cervical MRI 6/11 : multi-level DDD and L foraminal stenosis at C4-5 and C5-6.    Chronic venous insufficiency 2013   ECHO 2013 was normal. LFT's, creatinine, TSH all normal. Alb a bit low.   COPD (chronic obstructive pulmonary disease) ([place])    Per pt, he has a diagnosis of COPD. No PFT's. Uses Alb MDI less than once a month.   Diabetes mellitus    Non-insulin dependent Type II.   Edema 04/19/2011   ECHO 2013 was normal. LFT's, creatinine, TSH all normal. Alb a bit low. Likely 2/2 chronic venous insuff 2/2 weight.   Elevated liver function tests 05/17/2011   04/2010. Increased GGT (pt uses ETOH). AMA negative. ABD U/S limited but otherwise negative. Follow Alk phos level. AST & ALT also elevated. False + IgM Hep B Core Ab. Hep B viral load negative.   Same pattern during hospitalization 2010 : highest alkaline phosphatase was 355, AST 259, and ALT 871. ANA, rheumatoid factor, ceruloplasmin, CMV IgM, alpha antitrypsin, and AMA, all within normal limits.   Encounter for power mobility device assessment 03/14/2020   Erythrocytosis  07/04/2016   Gout    Pt has never had a crystal diagnosis.   Hepatitis B antibody positive 2013   IgM Hep B core Ab + but Hepatitis B viral load negative.    Hyperlipidemia    At goal of LDL <100 with statin.   Hypertension    ACEI monotherapy   Leukocytosis 01/11/2014   Since 2010. Smear 2015 mild left shift. Is on prednisone for RA but increase started before leukocytosis.    NASH (nonalcoholic steatohepatitis), ? some EtOH contribution and fibrosis  05/17/2011   Elevated since 05/2008 during hospitalization. Relatively stable. Extensive W/U and no etiology but likely fatty liver 2/2 obesity despite normal imaging.  Hepatitis - all negative. There was an initial + IgM Hep B core Ab but repeat testing was negative and Hep B viral load was negative.  TTG 06/15/2013  ANA x 2 (05/07/11 and 06/13/2008) ASMA x2 (07/18/2011 negative & weakly + at 21 on 05/27/2011 & nega   OA (osteoarthritis)    Obesity, Class III, BMI 40-49.9 (morbid obesity) ([place])    Obstructive sleep apnea 08/21/08   Sleep study AHI 16.4 with desat to 66%. CPAP of 17 decreased AHI to 0.9. Non-compliant with CPAP.   RA (rheumatoid arthritis) ([place]) 2013   Shortness of breath    Tobacco abuse    Per pt, he has a diagnosis of COPD. Need to locate PFT's.   Wheelchair bound      ROS:  Negative unless stated in  the HPI.   Assessment / Plan / Recommendations:  Please see A&P under problem oriented charting for assessment of the patient's acute and chronic medical conditions.  As always, pt is advised that if symptoms worsen or new symptoms arise, they should go to an urgent care facility or to to ER for further evaluation.   Consent and Medical Decision Making:  Patient discussed with Dr. Dareen Piano This is a telephone encounter between [name] and Idamae Schuller on 12/13/2021 for evaluation of his continued need for hospital bed. The visit was conducted with the patient located at home and Idamae Schuller at [facility]. The patient's identity  was confirmed using their DOB and current address. The patient has consented to being evaluated through a telephone encounter and understands the associated risks (an examination cannot be done and the patient may need to come in for an appointment) / benefits (allows the patient to remain at home, decreasing exposure to coronavirus). I personally spent 24 minutes on medical discussion.    RA (rheumatoid arthritis) (HCC) Due to the patient's girth, chronic pain from RA, obesity, OA, and DDD the patient requires positioning of the body in ways not feasible with an ordinary bed and the patient requires a bariatric bed. The hospital bed will prevent Mr Irby' pain from worsening by allowing better positioning and repositioning and will help to alleviate some of his chronic pain. The hospital bed will also prevent ulcerations from occurring as pt at increased risk due to limited mobility.   Tobacco abuse Counseled pt on smoking cessation and different options but patient not interested in any interventions.    Idamae Schuller, MD Tillie Rung. [facility], PGY-2

## 2021-12-12 NOTE — Telephone Encounter (Signed)
Patient notified of info below and transferred to [place] to schedule first available tele appt.

## 2021-12-12 NOTE — Telephone Encounter (Signed)
Sherlynn Carbon, Danielle; [place], Orvis Brill, RN; [place], [place], [place]; [place], Leory Plowman; [place], [place]; 1 other  I called his insurance. We will need new order for the bed. And doctor notes stating why he needs it.    ----- Message -----  From: Stephannie Peters  Sent: 12/12/2021  11:30 AM EDT  To: Darlina Guys; Miquel Dunn; Stephannie Peters; *  Subject: RE: Hospital bed repair                         Good morning! Can we get new orders for a HB for this patient? He received his bed back in 2019 and its no longer billing. I want to see if we could possibly get him a new one. Thank you!

## 2021-12-13 NOTE — Assessment & Plan Note (Signed)
Counseled pt on smoking cessation and different options but patient not interested in any interventions.

## 2021-12-13 NOTE — Assessment & Plan Note (Signed)
Due to the patient's girth, chronic pain from RA, obesity, OA, and DDD the patient requires positioning of the body in ways not feasible with an ordinary bed and the patient requires a bariatric bed. The hospital bed will prevent Andrew Fowler' pain from worsening by allowing better positioning and repositioning and will help to alleviate some of his chronic pain. The hospital bed will also prevent ulcerations from occurring as pt at increased risk due to limited mobility.

## 2021-12-14 NOTE — Progress Notes (Signed)
Internal Medicine Clinic Attending  Case discussed with Dr. Khan  At the time of the visit.  We reviewed the resident's history and exam and pertinent patient test results.  I agree with the assessment, diagnosis, and plan of care documented in the resident's note.  

## 2021-12-17 ENCOUNTER — Other Ambulatory Visit: Payer: Self-pay | Admitting: Internal Medicine

## 2021-12-17 DIAGNOSIS — J449 Chronic obstructive pulmonary disease, unspecified: Secondary | ICD-10-CM

## 2021-12-18 ENCOUNTER — Telehealth: Payer: Medicaid Other | Admitting: Internal Medicine

## 2021-12-19 NOTE — Addendum Note (Signed)
Addended by: [name] on: 12/19/2021 11:46 AM   Modules accepted: Orders

## 2021-12-19 NOTE — Telephone Encounter (Signed)
Actually I went back and added it. Will see if they approve it

## 2021-12-19 NOTE — Telephone Encounter (Signed)
CM sent to Stephannie Peters at [facility] for hospital bed. F2F was 12/12/21.

## 2021-12-19 NOTE — Telephone Encounter (Signed)
I put the order in. I did not order bariatric bed as he did not meet criteria (weight 260 lbs) and it specified weight over 350 lbs for bariatric

## 2021-12-19 NOTE — Addendum Note (Signed)
Addended by: [name] on: 12/19/2021 11:49 AM   Modules accepted: Orders

## 2021-12-20 NOTE — Telephone Encounter (Signed)
Patient calling again for hospital bed. 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 Left message on Danielle's VM requesting return call. Also sent CM to [facility] and her team.

## 2021-12-20 NOTE — Telephone Encounter (Signed)
Carola Rhine, RN; Estill Dooms; [place], [place]; 1 other  Hey guys! He will not qualify for a Bari bed. Can we write the order for a Semi electric? Thanks!

## 2021-12-21 NOTE — Addendum Note (Signed)
Addended by: [name] on: 12/21/2021 09:52 AM   Modules accepted: Orders

## 2021-12-31 ENCOUNTER — Other Ambulatory Visit: Payer: Self-pay | Admitting: Internal Medicine

## 2021-12-31 DIAGNOSIS — F17200 Nicotine dependence, unspecified, uncomplicated: Secondary | ICD-10-CM

## 2021-12-31 DIAGNOSIS — I10 Essential (primary) hypertension: Secondary | ICD-10-CM

## 2021-12-31 NOTE — Telephone Encounter (Signed)
Lisinopril was discontinued.

## 2022-01-09 ENCOUNTER — Other Ambulatory Visit: Payer: Self-pay

## 2022-01-09 DIAGNOSIS — F418 Other specified anxiety disorders: Secondary | ICD-10-CM

## 2022-01-09 DIAGNOSIS — G894 Chronic pain syndrome: Secondary | ICD-10-CM

## 2022-01-09 MED ORDER — HYDROCODONE-ACETAMINOPHEN 10-325 MG PO TABS: 1.0000 | ORAL_TABLET | Freq: Four times a day (QID) | ORAL | 0 refills | Status: DC | PRN

## 2022-01-09 MED ORDER — ALPRAZOLAM 0.5 MG PO TABS: 0.5000 mg | ORAL_TABLET | Freq: Two times a day (BID) | ORAL | 0 refills | Status: DC | PRN

## 2022-01-14 MED ORDER — HYDROCODONE-ACETAMINOPHEN 10-325 MG PO TABS
1.0000 | ORAL_TABLET | Freq: Four times a day (QID) | ORAL | 0 refills | Status: DC | PRN
  Filled 2022-01-14: qty 120, 30d supply, fill #0

## 2022-01-14 NOTE — Telephone Encounter (Signed)
[facility] called in [facility] [address] [facility] not [facility] in [facility]. He is advised to call other pharmacies such as [facility] or [facility] [address]. He will call back with name of Pharmacy so Rx can be resent.

## 2022-01-14 NOTE — Telephone Encounter (Signed)
[facility] has Hydrocodone in stock. Please resend Rx there. Rx written 10/18 has been [facility]

## 2022-01-15 ENCOUNTER — Other Ambulatory Visit (HOSPITAL_COMMUNITY): Payer: Self-pay

## 2022-01-16 ENCOUNTER — Other Ambulatory Visit: Payer: Self-pay | Admitting: Internal Medicine

## 2022-01-16 DIAGNOSIS — I10 Essential (primary) hypertension: Secondary | ICD-10-CM

## 2022-02-06 ENCOUNTER — Other Ambulatory Visit: Payer: Self-pay | Admitting: Internal Medicine

## 2022-02-06 DIAGNOSIS — G894 Chronic pain syndrome: Secondary | ICD-10-CM

## 2022-02-06 DIAGNOSIS — F418 Other specified anxiety disorders: Secondary | ICD-10-CM

## 2022-02-06 MED ORDER — HYDROCODONE-ACETAMINOPHEN 10-325 MG PO TABS: 1.0000 | ORAL_TABLET | Freq: Four times a day (QID) | ORAL | 0 refills | Status: DC | PRN

## 2022-02-06 MED ORDER — ALPRAZOLAM 0.5 MG PO TABS: 0.5000 mg | ORAL_TABLET | Freq: Two times a day (BID) | ORAL | 0 refills | Status: DC | PRN

## 2022-02-06 NOTE — Telephone Encounter (Signed)
Refill Request  ALPRAZolam (XANAX) 0.5 MG tablet   HYDROcodone-acetaminophen (NORCO) 10-325 MG tablet     [id number] [address](Ph: [phone]  Order Details

## 2022-02-11 ENCOUNTER — Other Ambulatory Visit (HOSPITAL_COMMUNITY): Payer: Self-pay

## 2022-02-11 ENCOUNTER — Other Ambulatory Visit: Payer: Self-pay | Admitting: Internal Medicine

## 2022-02-11 DIAGNOSIS — I10 Essential (primary) hypertension: Secondary | ICD-10-CM

## 2022-02-11 NOTE — Telephone Encounter (Signed)
Lisinopril was cancelled in 06/2021.

## 2022-02-26 ENCOUNTER — Ambulatory Visit (INDEPENDENT_AMBULATORY_CARE_PROVIDER_SITE_OTHER): Payer: Medicaid Other | Admitting: Internal Medicine

## 2022-02-26 DIAGNOSIS — Z79891 Long term (current) use of opiate analgesic: Secondary | ICD-10-CM

## 2022-02-26 NOTE — Assessment & Plan Note (Signed)
-  This problem is chronic and stable -Patient does have a history of RA as well as chronic right hip pain -.  Symptoms are well controlled with Norco at its current dose and helping him do his daily activities. -I explained to the patient that he has not had a UDS done this year and he needs to have this done prior to refilling his medications -He states that he is going to come in next week for an appointment. -We will see him at that time and follow-up with him for his chronic medical conditions as well as his chronic pain

## 2022-02-26 NOTE — Progress Notes (Signed)
I called Andrew Fowler as he was unable to come to his appointment today.  He states that he had a fever recently and is unsure what caused it but his symptoms have since resolved.  Patient has missed his last appointment with me as well as this 1 and needs an in person appointment for a UDS as well as lab testing and follow-up of his chronic medical conditions.  He denies any acute complaints at this time.  I have asked the front office to schedule him for an appointment next week on the day that I am precepting.  Patient is in agreement with this plan and is willing to come in next week.  We will follow-up with him at that time

## 2022-03-05 ENCOUNTER — Encounter: Payer: Self-pay | Admitting: Internal Medicine

## 2022-03-05 ENCOUNTER — Other Ambulatory Visit: Payer: Self-pay

## 2022-03-05 ENCOUNTER — Ambulatory Visit (INDEPENDENT_AMBULATORY_CARE_PROVIDER_SITE_OTHER): Payer: Medicaid Other | Admitting: Internal Medicine

## 2022-03-05 VITALS — BP 133/69 | HR 90 | Temp 98.2°F | Ht 71.0 in | Wt 280.3 lb

## 2022-03-05 DIAGNOSIS — J449 Chronic obstructive pulmonary disease, unspecified: Secondary | ICD-10-CM | POA: Diagnosis not present

## 2022-03-05 DIAGNOSIS — E785 Hyperlipidemia, unspecified: Secondary | ICD-10-CM | POA: Diagnosis not present

## 2022-03-05 DIAGNOSIS — L299 Pruritus, unspecified: Secondary | ICD-10-CM

## 2022-03-05 DIAGNOSIS — Z23 Encounter for immunization: Secondary | ICD-10-CM

## 2022-03-05 DIAGNOSIS — E1169 Type 2 diabetes mellitus with other specified complication: Secondary | ICD-10-CM

## 2022-03-05 DIAGNOSIS — Z720 Tobacco use: Secondary | ICD-10-CM

## 2022-03-05 DIAGNOSIS — L409 Psoriasis, unspecified: Secondary | ICD-10-CM

## 2022-03-05 DIAGNOSIS — I10 Essential (primary) hypertension: Secondary | ICD-10-CM

## 2022-03-05 DIAGNOSIS — G894 Chronic pain syndrome: Secondary | ICD-10-CM | POA: Diagnosis not present

## 2022-03-05 DIAGNOSIS — R609 Edema, unspecified: Secondary | ICD-10-CM

## 2022-03-05 DIAGNOSIS — Z79891 Long term (current) use of opiate analgesic: Secondary | ICD-10-CM | POA: Diagnosis not present

## 2022-03-05 DIAGNOSIS — F418 Other specified anxiety disorders: Secondary | ICD-10-CM | POA: Diagnosis not present

## 2022-03-05 DIAGNOSIS — F17210 Nicotine dependence, cigarettes, uncomplicated: Secondary | ICD-10-CM

## 2022-03-05 LAB — POCT GLYCOSYLATED HEMOGLOBIN (HGB A1C): Hemoglobin A1C: 6.1 % — AB (ref 4.0–5.6)

## 2022-03-05 LAB — GLUCOSE, CAPILLARY: Glucose-Capillary: 84 mg/dL (ref 70–99)

## 2022-03-05 MED ORDER — FUROSEMIDE 20 MG PO TABS: 20.0000 mg | ORAL_TABLET | Freq: Every day | ORAL | 0 refills | Status: DC

## 2022-03-05 MED ORDER — ACETIC ACID 2 % OT SOLN: 4.0000 [drp] | Freq: Every day | OTIC | 0 refills | Status: DC | PRN

## 2022-03-05 MED ORDER — ALPRAZOLAM 0.5 MG PO TABS: 0.5000 mg | ORAL_TABLET | Freq: Two times a day (BID) | ORAL | 0 refills | Status: DC | PRN

## 2022-03-05 MED ORDER — VARENICLINE TARTRATE 0.5 MG PO TABS: ORAL_TABLET | ORAL | 0 refills | Status: AC

## 2022-03-05 MED ORDER — HYDROCODONE-ACETAMINOPHEN 10-325 MG PO TABS: 1.0000 | ORAL_TABLET | Freq: Four times a day (QID) | ORAL | 0 refills | Status: DC | PRN

## 2022-03-05 MED ORDER — PREDNISONE 20 MG PO TABS: 40.0000 mg | ORAL_TABLET | Freq: Every day | ORAL | 0 refills | Status: DC

## 2022-03-05 NOTE — Assessment & Plan Note (Signed)
Patient endorses prior therapy with acetic acid eardrops for symptom of ear itching.  These drops have previously resolved his symptoms, however the prescription that he has at this time is expired.  He is requesting a new prescription for the eardrops. Plan: Prescription for acetic acid otic solution sent in.

## 2022-03-05 NOTE — Assessment & Plan Note (Signed)
Current regimen is hydrocodone-acetaminophen 10-325 mg q6h PRN. Last ToxAssure 07/2020 with expected results. PDMP reviewed and appropriate with last fill 02/11/2022. Plan:Hydrocodone-acetaminophen 10-325 mg q6h PRN refilled. ToxAssure obtained today.

## 2022-03-05 NOTE — Assessment & Plan Note (Signed)
Patient endorses decreasing daily tobacco use from around 2 packs/day down to roughly 1 pack/day.  He is interested in retrying Chantix to assist in smoking cessation. Plan: Prescription for Chantix sent in: 0.5 mg daily for 3 days, followed by 0.5 mg twice daily for 4 days, followed by 1 mg twice daily thereafter.

## 2022-03-05 NOTE — Assessment & Plan Note (Signed)
LDL at goal, 61 when last checked 06/2021. Currently on regimen of atorvastatin 40 mg daily. Plan:Continue atorvastatin 40 mg daily.

## 2022-03-05 NOTE — Assessment & Plan Note (Signed)
HbA1c last checked 06/2021 5.5%, today is 6.1%. At his last OV, metformin was discontinued.  Plan:Encouraged patient to continue with healthy lifestyle choices including diet and exercise. Will refrain from initiating therapy at this time.

## 2022-03-05 NOTE — Assessment & Plan Note (Signed)
Flu vaccine given today.

## 2022-03-05 NOTE — Assessment & Plan Note (Signed)
Patient complains of leg rash over the shin of the right lower extremity that began last Thursday.  He states that he was washing his leg and noticed the rash, and continued to scrub as he did not know the discoloration was and it has not since resolved.  It has been itchy at times with the symptoms being worse at night.  He has been scratching vigorously at the rash causing it to have areas of scabbed over lesions.  He denies new soaps, detergents, medications.  The rash is red with irregular borders and multiple nondraining scabbed areas.  There is no scaling over the area at this time.  Edema surrounding the rash and on his right lower extremity in general is tender in some areas. Assessment: Given patient's history of psoriasis I do suspect that this may be a psoriatic lesion.  He has been on Humira and stable without recurrence of psoriatic lesions however, and he does have Valisone (betamethasone valerate) cream at home which she has not used, but his purpose is for psoriatic lesions.  I do not suspect infection or contact dermatitis. Plan: I advised the patient to utilize the betamethasone valerate cream that he has at home for the lesion.  Will monitor for improvement/resolution of the rash at upcoming visit.

## 2022-03-05 NOTE — Assessment & Plan Note (Addendum)
Patient has bilateral 1-2+ pitting edema to the lower extremities with his right lower extremity being larger in size than the left.  Some areas of the right lower extremity pitting edema are tender to palpation.  He denies changes in his breathing and has not had new intolerance of lying flat on his back to sleep.  He is in a motorized wheelchair during our exam so do not suspect he ambulates much.  Last echocardiogram in June 2021 showed ejection fraction of 60 to 65%. Assessment: No prior diagnosis of heart failure.  Given his multiple comorbidities including COPD with suspected current exacerbation and known chronic passive congestion of his liver, is difficult to say whether his presentation is related to new heart failure, which could be related to his COPD, or possibly even congestive hepatopathy.  Regardless, patient would benefit from initiating diuresis.  His weight today is 280 pounds and 4.8 ounces. Plan: Will initiate Lasix 20 mg daily with room to increase dose for better diuresis in the future.  Echocardiogram ordered.  I advised the patient that it is my medical opinion that he will need to return for follow-up before the new year, and he will try to make it work from his and (finances are limited). CMP obtained today as well.

## 2022-03-05 NOTE — Patient Instructions (Addendum)
Andrew Fowler,  It was a pleasure to care for you today!  I am ordering the following tests for you: Echocardiogram: to look at your heart closely Pulmonary function test: to examine your lung function  I am ordering the following medicines for you: Prednisone 40 mg daily: for your increased wheezing. Take this for 5 days, then go back to taking 5 mg daily as usual Lasix 20 mg daily: for your leg swelling Chantix: to help with smoking cessation. Take 0.5 mg daily for 3 days, then 0.5 mg twice daily for 4 days, then 1 mg twice daily thereafter. Acetic acid drops: for your itchy ears  I recommend that you come back to clinic for check up the week of December 27. If you are unable to do this, I urge you to return to clinic as soon as we open back up after the new year. We need to see you again soon.  If you have any questions or need [place] sooner please do not hesitate to call.  My best, Dr. Marlou Sa

## 2022-03-05 NOTE — Assessment & Plan Note (Signed)
Patient with history of COPD currently on albuterol, Stiolto Respimat, and prednisone 5 mg daily presents for routine checkup.  He states that he thinks his COPD is doing fine and reports adherence to his current regimen, however at the start of exam I could immediately appreciate wheezing despite being seated several feet away from the patient.  On further questioning he admits to having increased need for his albuterol inhaler, using it 3-4 times per day, over the last 1 month or so.  He denies recent illness, change in his cough and sputum.  He speaks in short sentences and as above I appreciated expiratory wheezing prior to auscultating his lungs.  On auscultation he does have diffuse inspiratory and expiratory wheezing.  He is saturating 97% on room air.  He has not used his Stiolto Respimat therapy today. Assessment: I am concerned for acute exacerbation of COPD.  Patient is already on LAMA-LABA therapy in addition to daily oral prednisone.  I do not suspect acute illness associated with the exacerbation. Plan: Will prescribe 5-day course of prednisone 40 mg/day and have the patient transition back to daily 5 mg after this 5-day course.  I have ordered PFTs as well.

## 2022-03-05 NOTE — Assessment & Plan Note (Signed)
BP at goal, 133/69. He is not currently on antihypertensive therapy as his ACEI was stopped in April 2023 due to lower BP measurements. BMP at that visit showed normal renal function and urine microalbumin was elevated to 76. Plan:Will not restart an ACEI today in light of other therapies being initiated for bilateral lower extremity edema. Consider addition of ACEI in the future given diabetes, microalbuminuria, and hypertension.

## 2022-03-05 NOTE — Assessment & Plan Note (Signed)
Patient is requesting refill for Xanax 0.5 mg BID PRN. He has been counseled before on weaning to discontinuation of this drug but has firmly stated several times he has no intention of stopping the medication. He has not successfully trialed other medications for anxiety in the past due to noncompliance. PDMP reviewed, last fill 02/06/2022. Plan:Xanax 0.5 mg BID PRN refilled.

## 2022-03-05 NOTE — Progress Notes (Signed)
CC: follow-up for multiple conditions  HPI:  Andrew Fowler is a 61 y.o. person with past medical history as detailed below who presents today for follow-up of multiple medical conditions.  Please see problem charting for detail assessment and plan.  Past Medical History:  Diagnosis Date   Abnormal laboratory test result 07/04/2011   Immunofixation electrophoresis 3/13 showed slightly restricted mobility in the IgG and kappa lanes and suggested repeat end of year.    Anxiety associated with depression 04/19/2011   On chronic benzos and SSRI's. Gets panic attacks. Does not see mental health.   Chronic pain syndrome 04/19/2011   Combination of RA, obesity, OA, & DDD. Has seen Dr Sharol Given & s/p R total hip arthroplasty 2/2 OA & ? AVN. s/p L4-L5 laminectomy. Lumbar MRI 4/11 : L3-L4 mod central canal narrowing.  Cervical MRI 6/11 : multi-level DDD and L foraminal stenosis at C4-5 and C5-6.    Chronic venous insufficiency 2013   ECHO 2013 was normal. LFT's, creatinine, TSH all normal. Alb a bit low.   COPD (chronic obstructive pulmonary disease) ([place])    Per pt, he has a diagnosis of COPD. No PFT's. Uses Alb MDI less than once a month.   Diabetes mellitus    Non-insulin dependent Type II.   Edema 04/19/2011   ECHO 2013 was normal. LFT's, creatinine, TSH all normal. Alb a bit low. Likely 2/2 chronic venous insuff 2/2 weight.   Elevated liver function tests 05/17/2011   04/2010. Increased GGT (pt uses ETOH). AMA negative. ABD U/S limited but otherwise negative. Follow Alk phos level. AST & ALT also elevated. False + IgM Hep B Core Ab. Hep B viral load negative.   Same pattern during hospitalization 2010 : highest alkaline phosphatase was 355, AST 259, and ALT 871. ANA, rheumatoid factor, ceruloplasmin, CMV IgM, alpha antitrypsin, and AMA, all within normal limits.   Encounter for power mobility device assessment 03/14/2020   Erythrocytosis 07/04/2016   Gout    Pt has never had a crystal diagnosis.    Hepatitis B antibody positive 2013   IgM Hep B core Ab + but Hepatitis B viral load negative.    Hyperlipidemia    At goal of LDL <100 with statin.   Hypertension    ACEI monotherapy   Leukocytosis 01/11/2014   Since 2010. Smear 2015 mild left shift. Is on prednisone for RA but increase started before leukocytosis.    NASH (nonalcoholic steatohepatitis), ? some EtOH contribution and fibrosis  05/17/2011   Elevated since 05/2008 during hospitalization. Relatively stable. Extensive W/U and no etiology but likely fatty liver 2/2 obesity despite normal imaging.  Hepatitis - all negative. There was an initial + IgM Hep B core Ab but repeat testing was negative and Hep B viral load was negative.  TTG 06/15/2013  ANA x 2 (05/07/11 and 06/13/2008) ASMA x2 (07/18/2011 negative & weakly + at 21 on 05/27/2011 & nega   OA (osteoarthritis)    Obesity, Class III, BMI 40-49.9 (morbid obesity) ([place])    Obstructive sleep apnea 08/21/08   Sleep study AHI 16.4 with desat to 66%. CPAP of 17 decreased AHI to 0.9. Non-compliant with CPAP.   RA (rheumatoid arthritis) ([place]) 2013   Shortness of breath    Tobacco abuse    Per pt, he has a diagnosis of COPD. Need to locate PFT's.   Wheelchair bound    Review of Systems: Negative unless otherwise stated.  Physical Exam:  Vitals:   03/05/22 1008  BP: 133/69  Pulse: 90  Temp: 98.2 F (36.8 C)  TempSrc: Oral  SpO2: 97%  Weight: 280 lb 4.8 oz (127.1 kg)  Height: 5' 11" (1.803 m)   Constitutional: Overweight gentleman seated comfortably in electric wheelchair. In no acute distress. HENT: Ears: Bilateral tympanic membranes, ear canals normal. Cardio:Regular rate and rhythm.  Pulm: Audible wheezing when patient speaks.  On lung auscultation there is diffuse inspiratory and expiratory wheezing.  O2 saturation on room air is 97%.  Patient completes short sentences.  No accessory muscle use or tripoding. MSK: Bilateral 1-2+ pitting edema to the distal lower extremities with  right greater than left.  Mild tenderness to palpation to some areas of edema on the right distal lower extremity. Skin:Rash that is red with irregular borders and multiple nondraining scabbed areas.  There is no scaling over the area at this time. Neuro:Alert and oriented x3. No focal deficit noted. Psych:Pleasant mood and affect.   Assessment & Plan:   See Encounters Tab for problem based charting.  Chronic prescription opiate use Current regimen is hydrocodone-acetaminophen 10-325 mg q6h PRN. Last ToxAssure 07/2020 with expected results. PDMP reviewed and appropriate with last fill 02/11/2022. Plan:Hydrocodone-acetaminophen 10-325 mg q6h PRN refilled. ToxAssure obtained today.  HTN (hypertension) BP at goal, 133/69. He is not currently on antihypertensive therapy as his ACEI was stopped in April 2023 due to lower BP measurements. BMP at that visit showed normal renal function and urine microalbumin was elevated to 76. Plan:Will not restart an ACEI today in light of other therapies being initiated for bilateral lower extremity edema. Consider addition of ACEI in the future given diabetes, microalbuminuria, and hypertension.  Type 2 diabetes mellitus with other specified complication (HCC) QHU7M last checked 06/2021 5.5%, today is 6.1%. At his last OV, metformin was discontinued.  Plan:Encouraged patient to continue with healthy lifestyle choices including diet and exercise. Will refrain from initiating therapy at this time.  Hyperlipidemia LDL at goal, 61 when last checked 06/2021. Currently on regimen of atorvastatin 40 mg daily. Plan:Continue atorvastatin 40 mg daily.  Anxiety associated with depression Patient is requesting refill for Xanax 0.5 mg BID PRN. He has been counseled before on weaning to discontinuation of this drug but has firmly stated several times he has no intention of stopping the medication. He has not successfully trialed other medications for anxiety in the past  due to noncompliance. PDMP reviewed, last fill 02/06/2022. Plan:Xanax 0.5 mg BID PRN refilled.   Need for immunization against influenza Flu vaccine given today.  Psoriasis Patient complains of leg rash over the shin of the right lower extremity that began last Thursday.  He states that he was washing his leg and noticed the rash, and continued to scrub as he did not know the discoloration was and it has not since resolved.  It has been itchy at times with the symptoms being worse at night.  He has been scratching vigorously at the rash causing it to have areas of scabbed over lesions.  He denies new soaps, detergents, medications.  The rash is red with irregular borders and multiple nondraining scabbed areas.  There is no scaling over the area at this time.  Edema surrounding the rash and on his right lower extremity in general is tender in some areas. Assessment: Given patient's history of psoriasis I do suspect that this may be a psoriatic lesion.  He has been on Humira and stable without recurrence of psoriatic lesions however, and he does have Valisone (betamethasone valerate)  cream at home which she has not used, but his purpose is for psoriatic lesions.  I do not suspect infection or contact dermatitis. Plan: I advised the patient to utilize the betamethasone valerate cream that he has at home for the lesion.  Will monitor for improvement/resolution of the rash at upcoming visit.  Edema, pitting Patient has bilateral 1-2+ pitting edema to the lower extremities with his right lower extremity being larger in size than the left.  Some areas of the right lower extremity pitting edema are tender to palpation.  He denies changes in his breathing and has not had new intolerance of lying flat on his back to sleep.  He is in a motorized wheelchair during our exam so do not suspect he ambulates much.  Last echocardiogram in June 2021 showed ejection fraction of 60 to 65%. Assessment: No prior diagnosis of  heart failure.  Given his multiple comorbidities including COPD with suspected current exacerbation and known chronic passive congestion of his liver, is difficult to say whether his presentation is related to new heart failure, which could be related to his COPD, or possibly even congestive hepatopathy.  Regardless, patient would benefit from initiating diuresis.  His weight today is 280 pounds and 4.8 ounces. Plan: Will initiate Lasix 20 mg daily with room to increase dose for better diuresis in the future.  Echocardiogram ordered.  I advised the patient that it is my medical opinion that he will need to return for follow-up before the new year, and he will try to make it work from his and (finances are limited). CMP obtained today as well.  COPD (chronic obstructive pulmonary disease) ([place]) Patient with history of COPD currently on albuterol, Stiolto Respimat, and prednisone 5 mg daily presents for routine checkup.  He states that he thinks his COPD is doing fine and reports adherence to his current regimen, however at the start of exam I could immediately appreciate wheezing despite being seated several feet away from the patient.  On further questioning he admits to having increased need for his albuterol inhaler, using it 3-4 times per day, over the last 1 month or so.  He denies recent illness, change in his cough and sputum.  He speaks in short sentences and as above I appreciated expiratory wheezing prior to auscultating his lungs.  On auscultation he does have diffuse inspiratory and expiratory wheezing.  He is saturating 97% on room air.  He has not used his Stiolto Respimat therapy today. Assessment: I am concerned for acute exacerbation of COPD.  Patient is already on LAMA-LABA therapy in addition to daily oral prednisone.  I do not suspect acute illness associated with the exacerbation. Plan: Will prescribe 5-day course of prednisone 40 mg/day and have the patient transition back to daily 5 mg  after this 5-day course.  I have ordered PFTs as well.  Tobacco abuse Patient endorses decreasing daily tobacco use from around 2 packs/day down to roughly 1 pack/day.  He is interested in retrying Chantix to assist in smoking cessation. Plan: Prescription for Chantix sent in: 0.5 mg daily for 3 days, followed by 0.5 mg twice daily for 4 days, followed by 1 mg twice daily thereafter.  Itching of ear Patient endorses prior therapy with acetic acid eardrops for symptom of ear itching.  These drops have previously resolved his symptoms, however the prescription that he has at this time is expired.  He is requesting a new prescription for the eardrops. Plan: Prescription for acetic acid otic  solution sent in.  Patient seen with Dr.  Cain Sieve

## 2022-03-06 ENCOUNTER — Telehealth: Payer: Self-pay

## 2022-03-06 LAB — CMP14 + ANION GAP
ALT: 50 IU/L — ABNORMAL HIGH (ref 0–44)
AST: 84 IU/L — ABNORMAL HIGH (ref 0–40)
Albumin/Globulin Ratio: 0.7 — ABNORMAL LOW (ref 1.2–2.2)
Albumin: 2.8 g/dL — ABNORMAL LOW (ref 3.8–4.9)
Alkaline Phosphatase: 407 IU/L — ABNORMAL HIGH (ref 44–121)
Anion Gap: 13 mmol/L (ref 10.0–18.0)
BUN/Creatinine Ratio: 13 (ref 10–24)
BUN: 10 mg/dL (ref 8–27)
Bilirubin Total: 1.4 mg/dL — ABNORMAL HIGH (ref 0.0–1.2)
CO2: 23 mmol/L (ref 20–29)
Calcium: 8.7 mg/dL (ref 8.6–10.2)
Chloride: 97 mmol/L (ref 96–106)
Creatinine, Ser: 0.75 mg/dL — ABNORMAL LOW (ref 0.76–1.27)
Globulin, Total: 3.8 g/dL (ref 1.5–4.5)
Glucose: 87 mg/dL (ref 70–99)
Potassium: 3.9 mmol/L (ref 3.5–5.2)
Sodium: 133 mmol/L — ABNORMAL LOW (ref 134–144)
Total Protein: 6.6 g/dL (ref 6.0–8.5)
eGFR: 103 mL/min/{1.73_m2} (ref >59)

## 2022-03-06 NOTE — Telephone Encounter (Signed)
Questions about medications, please call pt back.

## 2022-03-06 NOTE — Telephone Encounter (Signed)
RTC to patient -had questions about his Prednisone.  Was cleared and answered by his Pharmacist.

## 2022-03-07 NOTE — Progress Notes (Signed)
Internal Medicine Clinic Attending  I saw and evaluated the patient.  I personally confirmed the key portions of the history and exam documented by Dr. Marlou Sa and I reviewed pertinent patient test results.  The assessment, diagnosis, and plan were formulated together and I agree with the documentation in the resident's note.   Lots going on today with this chronically ill patient.  Patient is experiencing increased dyspnea on exertion and has diffuse wheezing on my exam. Agree with plan to treat with 5 day course of Prednisone.  In reviewing his chart more, it looks like his 2021 RUQ U/S demonstrated some evidence of cirrhosis, but this had previously been well compensated. Today, he had bilateral lower extremity edema with warm extremities. Agree with TTE to rule out CHF. Labs demonstrate elevated bilirubin and low albumin, so I worry that his cirrhosis could be contributing to his swelling. Will trial Lasix 4m daily this week, with repeat labs next week. Would also check CBC at that time to assess platelet count.

## 2022-03-08 LAB — TOXASSURE SELECT,+ANTIDEPR,UR

## 2022-03-12 ENCOUNTER — Other Ambulatory Visit: Payer: Self-pay

## 2022-03-12 ENCOUNTER — Other Ambulatory Visit (HOSPITAL_COMMUNITY): Payer: Self-pay

## 2022-03-12 ENCOUNTER — Other Ambulatory Visit: Payer: Self-pay | Admitting: Internal Medicine

## 2022-03-12 DIAGNOSIS — G894 Chronic pain syndrome: Secondary | ICD-10-CM

## 2022-03-12 MED ORDER — HYDROCODONE-ACETAMINOPHEN 10-325 MG PO TABS
1.0000 | ORAL_TABLET | Freq: Four times a day (QID) | ORAL | 0 refills | Status: DC | PRN
  Filled 2022-03-12: qty 120, 30d supply, fill #0

## 2022-03-12 NOTE — Telephone Encounter (Signed)
Patient calling again for refill on hydrocodone at [facility]. States he has been out x 2 days and is in pain.

## 2022-03-12 NOTE — Telephone Encounter (Signed)
Pt called / informed of refill on [place].

## 2022-03-12 NOTE — Telephone Encounter (Signed)
Refill on HYDROcodone-acetaminophen (NORCO) 10-325 MG tablet @ [place] [facility].

## 2022-03-12 NOTE — Telephone Encounter (Signed)
Called pt who stated he went to pick up Hydrocodone rx at [facility] but was told they do not have any and do not know when it will be in stock.   I called [facility] - they do have it. Pt was informed; I told him a new rx has to be sent to the pharmacy.

## 2022-03-13 ENCOUNTER — Other Ambulatory Visit (HOSPITAL_COMMUNITY): Payer: Self-pay

## 2022-03-20 NOTE — Telephone Encounter (Signed)
Last office visit: 03/05/22  Last UDS: 03/05/22  Last Refill: last dispensed 03/13/2022 #30 Next appt:  03/21/22

## 2022-03-21 ENCOUNTER — Encounter: Payer: Medicaid Other | Admitting: Student

## 2022-03-22 ENCOUNTER — Other Ambulatory Visit (HOSPITAL_COMMUNITY): Payer: Self-pay

## 2022-03-28 ENCOUNTER — Encounter: Payer: Medicaid Other | Admitting: Student

## 2022-04-01 ENCOUNTER — Other Ambulatory Visit: Payer: Self-pay | Admitting: Internal Medicine

## 2022-04-01 DIAGNOSIS — J449 Chronic obstructive pulmonary disease, unspecified: Secondary | ICD-10-CM

## 2022-04-09 ENCOUNTER — Other Ambulatory Visit: Payer: Self-pay

## 2022-04-09 DIAGNOSIS — F418 Other specified anxiety disorders: Secondary | ICD-10-CM

## 2022-04-09 DIAGNOSIS — G894 Chronic pain syndrome: Secondary | ICD-10-CM

## 2022-04-09 NOTE — Telephone Encounter (Signed)
Pt would like the   HYDROcodone-acetaminophen (NORCO) 10-325 MG tablet  To be sent to [facility]. [facility] [address] [facility].  Requesting to speak with a nurse, please call pt back.

## 2022-04-11 ENCOUNTER — Other Ambulatory Visit (HOSPITAL_COMMUNITY): Payer: Self-pay

## 2022-04-11 ENCOUNTER — Other Ambulatory Visit: Payer: Self-pay | Admitting: Internal Medicine

## 2022-04-11 DIAGNOSIS — F418 Other specified anxiety disorders: Secondary | ICD-10-CM

## 2022-04-11 NOTE — Telephone Encounter (Signed)
[facility].

## 2022-04-11 NOTE — Telephone Encounter (Signed)
Patient requesting this be sent to [facility] today.

## 2022-04-12 ENCOUNTER — Other Ambulatory Visit (HOSPITAL_COMMUNITY): Payer: Self-pay

## 2022-04-12 MED ORDER — HYDROCODONE-ACETAMINOPHEN 10-325 MG PO TABS
1.0000 | ORAL_TABLET | Freq: Four times a day (QID) | ORAL | 0 refills | Status: DC | PRN
  Filled 2022-04-12: qty 20, 5d supply, fill #0

## 2022-04-12 MED ORDER — HYDROCODONE-ACETAMINOPHEN 10-325 MG PO TABS
1.0000 | ORAL_TABLET | Freq: Four times a day (QID) | ORAL | 0 refills | Status: DC | PRN
  Filled 2022-04-12 – 2022-04-18 (×2): qty 100, 25d supply, fill #0

## 2022-04-12 NOTE — Telephone Encounter (Signed)
Patient called in stating his insurance would only pay for 5 day supply without a PA. Confirmed with Clair Gulling at [facility] that patient received 20 tabs of hydrocodone today. That Rx is now null and void. Clair Gulling will need a new Rx for the remaining 100 tabs. He will be faxing a PA request form today.

## 2022-04-12 NOTE — Telephone Encounter (Signed)
Ill send in the 100 pill rx

## 2022-04-18 ENCOUNTER — Telehealth: Payer: Self-pay

## 2022-04-18 ENCOUNTER — Other Ambulatory Visit (HOSPITAL_COMMUNITY): Payer: Self-pay

## 2022-04-18 DIAGNOSIS — G894 Chronic pain syndrome: Secondary | ICD-10-CM

## 2022-04-18 NOTE — Telephone Encounter (Signed)
Decision:Approved  [facility] reviewed your request got Hydrocodone-Acetaminophen 10-325 mg for the above identified member , and it is approved as follows: Hydrocodone-Acetaminophen 10-'325mg'$ : quantity/billable units of 100 approved for 04/18/2022-10/15/2022  Approved J code (if applicable):J8499 Thank you, [place] is aware of approval.

## 2022-04-18 NOTE — Telephone Encounter (Signed)
Prior Authorization for patient (Hydrocodone-Acetaminophen) came through on cover my meds was submitted with last office notes awaiting approval or denial.

## 2022-04-19 ENCOUNTER — Other Ambulatory Visit (HOSPITAL_COMMUNITY): Payer: Self-pay

## 2022-04-19 MED ORDER — HYDROCODONE-ACETAMINOPHEN 10-325 MG PO TABS: 1.0000 | ORAL_TABLET | Freq: Four times a day (QID) | ORAL | 0 refills | Status: DC | PRN

## 2022-04-19 NOTE — Telephone Encounter (Signed)
[facility] medication. Requesting HYDROcodone-acetaminophen ([place]) 10-325 MG tablet [facility] [address]. Pt would like a call back.

## 2022-04-19 NOTE — Telephone Encounter (Signed)
Confirmed with Andrew Fowler at [facility] that they have been out of hydrocodone 10-325 for over a week. He will cancel this Rx.

## 2022-04-19 NOTE — Telephone Encounter (Signed)
Patient notified that [facility]. He is very [employer] attorney.

## 2022-04-20 ENCOUNTER — Other Ambulatory Visit: Payer: Self-pay | Admitting: Internal Medicine

## 2022-04-20 DIAGNOSIS — L299 Pruritus, unspecified: Secondary | ICD-10-CM

## 2022-04-22 ENCOUNTER — Telehealth: Payer: Self-pay

## 2022-04-22 DIAGNOSIS — R609 Edema, unspecified: Secondary | ICD-10-CM | POA: Diagnosis not present

## 2022-04-22 DIAGNOSIS — R062 Wheezing: Secondary | ICD-10-CM | POA: Diagnosis not present

## 2022-04-22 NOTE — Telephone Encounter (Signed)
Pt states he has not pick up his fluid medication. States his his lower extremity are swollen. What to know if the fluid pill can be increased. Please call pt back.

## 2022-04-22 NOTE — Telephone Encounter (Signed)
RTC to patient states is having swelling in legs and feet.  Now goes to abdomen.  Is propping up legs which helps a little.  Called EMS this morning was given a treatment.  Feels better . Question if he can increase the amount of Lasix he is taking.  Only takes 1 20 mg tablet per day.  Is drinking  plenty of fluids like GatorAide and water.  Thinks he may have had Covid a couple of weeks ago not sure.   Had Diarrhea and loss of taste.  Still has a loss of thirst.  Had some cold chills and Diarrhea  as well.  Offered an appointment stated has to give a 2 day notice.  Mainly would like to take extra Lasix for the increase in swelling.

## 2022-04-22 NOTE — Telephone Encounter (Signed)
Would be fine to take an extra tablet for the next few days, would like for him to also get an appointment to be seen.

## 2022-04-22 NOTE — Telephone Encounter (Signed)
RTC to patient informed that he can take a extra tablet for the next few days per Dr. Heber Risingsun.  Patient was given an appointment for Thursday morning at 8:45 AM with Dr, Jodi Mourning.  Patient to check to see if he can get transportation to come in at 8:45 AM on Thursday morning for an appointment.

## 2022-04-23 ENCOUNTER — Other Ambulatory Visit (HOSPITAL_COMMUNITY): Payer: Self-pay

## 2022-04-23 NOTE — Progress Notes (Incomplete)
CC: ***  HPI:   [name] is a 62 y.o. male with a past medical history of obesity, hypertension, hyperlipidemia, diabetes, tobacco use, COPD, RA, venous insufficiency, and chronic pain who presents for lower extremity swelling. He was last seen at [facility] in 02-2022.   LOWER EXTREMITY SWELLING VISIT Edema, pitting Patient has bilateral 1-2+ pitting edema to the lower extremities with his right lower extremity being larger in size than the left.  Some areas of the right lower extremity pitting edema are tender to palpation.  He denies changes in his breathing and has not had new intolerance of lying flat on his back to sleep.  He is in a motorized wheelchair during our exam so do not suspect he ambulates much.  Last echocardiogram in June 2021 showed ejection fraction of 60 to 65%. Assessment: No prior diagnosis of heart failure.  Given his multiple comorbidities including COPD with suspected current exacerbation and known chronic passive congestion of his liver, is difficult to say whether his presentation is related to new heart failure, which could be related to his COPD, or possibly even congestive hepatopathy.  Regardless, patient would benefit from initiating diuresis.  His weight today is 280 pounds and 4.8 ounces. Plan: Will initiate Lasix 20 mg daily with room to increase dose for better diuresis in the future.  Echocardiogram ordered.  I advised the patient that it is my medical opinion that he will need to return for follow-up before the new year, and he will try to make it work from his and (finances are limited). CMP obtained today as well. ATTENDING In reviewing his chart more, it looks like his 2021 RUQ U/S demonstrated some evidence of cirrhosis, but this had previously been well compensated. Today, he had bilateral lower extremity edema with warm extremities. Agree with TTE to rule out CHF. Labs demonstrate elevated bilirubin and low albumin, so I worry that his cirrhosis could  be contributing to his swelling. Will trial Lasix '20mg'$  daily this week, with repeat labs next week. Would also check CBC at that time to assess platelet count.   PHONE RTC to patient states is having swelling in legs and feet.  Now goes to abdomen.  Is propping up legs which helps a little.  Called EMS this morning was given a treatment.  Feels better . Question if he can increase the amount of Lasix he is taking.  Only takes 1 20 mg tablet per day.  Is drinking  plenty of fluids like GatorAide and water.  Thinks he may have had Covid a couple of weeks ago not sure.   Had Diarrhea and loss of taste.  Still has a loss of thirst.  Had some cold chills and Diarrhea  as well.  Offered an appointment stated has to give a 2 day notice.  Mainly would like to take extra Lasix for the increase in swelling.  = given okay to take extra furosemide pills over the next few days for the swelling In reviewing his chart more, it looks like his 2021 RUQ U/S demonstrated some evidence of cirrhosis, but this had previously been well compensated. Today, he had bilateral lower extremity edema with warm extremities. Agree with TTE to rule out CHF. Labs demonstrate elevated bilirubin and low albumin, so I worry that his cirrhosis could be contributing to his swelling. Will trial Lasix '20mg'$  daily this week, with repeat labs next week. Would also check CBC at that time to assess platelet count.  Xxx How many extra furosemides? Has this been helping? Swelling anywhere else, just legs Exam - legs pitting edema, crackles, abdomen CMP for ALT-AST and electrolytes CBC for platelets    Chronic prescription opiate use Current regimen is hydrocodone-acetaminophen 10-325 mg q6h PRN. Last Tox[employer]ure 07/2020 with expected results. PDMP reviewed and appropriate with last fill 02/11/2022. Plan:Hydrocodone-acetaminophen 10-325 mg q6h PRN refilled. Tox[employer]ure obtained today. - EXPECTED RESULTS  Xxx Taking these? Last drug test with  expected results     HTN (hypertension) BP at goal, 133/69. He is not currently on antihypertensive therapy as his ACEI was stopped in April 2023 due to lower BP measurements. BMP at that visit showed normal renal function and urine microalbumin was elevated to 76. Plan:Will not restart an ACEI today in light of other therapies being initiated for bilateral lower extremity edema. Consider addition of ACEI in the future given diabetes, microalbuminuria, and hypertension.  Xxx BP home ? BP clinic Consider lisinopril if elevated     Type 2 diabetes mellitus with other specified complication ([place]) UDJ4H last checked 06/2021 5.5%, today is 6.1%. At his last OV, metformin was discontinued.  Plan:Encouraged patient to continue with healthy lifestyle choices including diet and exercise. Will refrain from initiating therapy at this time.  Xxx A1C 6.1 in 02-2022 Metformin discontinued 02-2022     Hyperlipidemia LDL at goal, 61 when last checked 06/2021. Currently on regimen of atorvastatin 40 mg daily. Plan:Continue atorvastatin 40 mg daily.  Xxx Good adherence to atorvastatin per chart LDL 73 as of 06-2021 Goal <100    Anxiety associated with depression Patient is requesting refill for Xanax 0.5 mg BID PRN. He has been counseled before on weaning to discontinuation of this drug but has firmly stated several times he has no intention of stopping the medication. He has not successfully trialed other medications for anxiety in the past due to noncompliance. PDMP reviewed, last fill 02/06/2022. Plan:Xanax 0.5 mg BID PRN refilled.   Xxx Taking Alprazolam? Helping with mood or sleep? Mood today = x    Need for immunization against influenza Flu vaccine given today.   Psoriasis Patient complains of leg rash over the shin of the right lower extremity that began last Thursday.  He states that he was washing his leg and noticed the rash, and continued to scrub as he did not know the  discoloration was and it has not since resolved.  It has been itchy at times with the symptoms being worse at night.  He has been scratching vigorously at the rash causing it to have areas of scabbed over lesions.  He denies new soaps, detergents, medications.  The rash is red with irregular borders and multiple nondraining scabbed areas.  There is no scaling over the area at this time.  Edema surrounding the rash and on his right lower extremity in general is tender in some areas. Assessment: Given patient's history of psoriasis I do suspect that this may be a psoriatic lesion.  He has been on Humira and stable without recurrence of psoriatic lesions however, and he does have Valisone (betamethasone valerate) cream at home which she has not used, but his purpose is for psoriatic lesions.  I do not suspect infection or contact dermatitis. Plan: I advised the patient to utilize the betamethasone valerate cream that he has at home for the lesion.  Will monitor for improvement/resolution of the rash at upcoming visit.   Xxx Rash improved ???     COPD (chronic obstructive pulmonary disease) (  [place]) Patient with history of COPD currently on albuterol, Tiotropium Bromide-Olodaterol, and prednisone 5 mg daily presents for routine checkup.  He states that he thinks his COPD is doing fine and reports adherence to his current regimen, however at the start of exam I could immediately appreciate wheezing despite being seated several feet away from the patient.  On further questioning he admits to having increased need for his albuterol inhaler, using it 3-4 times per day, over the last 1 month or so.  He denies recent illness, change in his cough and sputum.  He speaks in short sentences and as above I appreciated expiratory wheezing prior to auscultating his lungs.  On auscultation he does have diffuse inspiratory and expiratory wheezing.  He is saturating 97% on room air.  He has not used his Stiolto Respimat therapy  today. Assessment: I am concerned for acute exacerbation of COPD.  Patient is already on LAMA-LABA therapy in addition to daily oral prednisone.  I do not suspect acute illness associated with the exacerbation. Plan: Will prescribe 5-day course of prednisone 40 mg/day and have the patient transition back to daily 5 mg after this 5-day course.  I have ordered PFTs as well.  Xxx Completed prednisone taper? Breathing improved? Frequency of inhaled medications including albuterol Breath shortness, wheezing, cough frequency production, chest pain     Tobacco abuse Patient endorses decreasing daily tobacco use from around 2 packs/day down to roughly 1 pack/day.  He is interested in retrying Chantix to assist in smoking cessation. Plan: Prescription for Chantix sent in: 0.5 mg daily for 3 days, followed by 0.5 mg twice daily for 4 days, followed by 1 mg twice daily thereafter.  Xx Taking Varenicline? Helpful ?     Itching of ear Patient endorses prior therapy with acetic acid eardrops for symptom of ear itching.  These drops have previously resolved his symptoms, however the prescription that he has at this time is expired.  He is requesting a new prescription for the eardrops. Plan: Prescription for acetic acid otic solution sent in.  Ear drops helping???    Past Medical History:  Diagnosis Date   Abnormal laboratory test result 07/04/2011   Immunofixation electrophoresis 3/13 showed slightly restricted mobility in the IgG and kappa lanes and suggested repeat end of year.    Anxiety associated with depression 04/19/2011   On chronic benzos and SSRI's. Gets panic attacks. Does not see mental health.   Chronic pain syndrome 04/19/2011   Combination of RA, obesity, OA, & DDD. Has seen Dr Sharol Given & s/p R total hip arthroplasty 2/2 OA & ? AVN. s/p L4-L5 laminectomy. Lumbar MRI 4/11 : L3-L4 mod central canal narrowing.  Cervical MRI 6/11 : multi-level DDD and L foraminal stenosis at C4-5 and C5-6.     Chronic venous insufficiency 2013   ECHO 2013 was normal. LFT's, creatinine, TSH all normal. Alb a bit low.   COPD (chronic obstructive pulmonary disease) ([place])    Per pt, he has a diagnosis of COPD. No PFT's. Uses Alb MDI less than once a month.   Diabetes mellitus    Non-insulin dependent Type II.   Edema 04/19/2011   ECHO 2013 was normal. LFT's, creatinine, TSH all normal. Alb a bit low. Likely 2/2 chronic venous insuff 2/2 weight.   Elevated liver function tests 05/17/2011   04/2010. Increased GGT (pt uses ETOH). AMA negative. ABD U/S limited but otherwise negative. Follow Alk phos level. AST & ALT also elevated. False + IgM Hep B  Core Ab. Hep B viral load negative.   Same pattern during hospitalization 2010 : highest alkaline phosphatase was 355, AST 259, and ALT 871. ANA, rheumatoid factor, ceruloplasmin, CMV IgM, alpha antitrypsin, and AMA, all within normal limits.   Encounter for power mobility device assessment 03/14/2020   Erythrocytosis 07/04/2016   Gout    Pt has never had a crystal diagnosis.   Hepatitis B antibody positive 2013   IgM Hep B core Ab + but Hepatitis B viral load negative.    Hyperlipidemia    At goal of LDL <100 with statin.   Hypertension    ACEI monotherapy   Leukocytosis 01/11/2014   Since 2010. Smear 2015 mild left shift. Is on prednisone for RA but increase started before leukocytosis.    NASH (nonalcoholic steatohepatitis), ? some EtOH contribution and fibrosis  05/17/2011   Elevated since 05/2008 during hospitalization. Relatively stable. Extensive W/U and no etiology but likely fatty liver 2/2 obesity despite normal imaging.  Hepatitis - all negative. There was an initial + IgM Hep B core Ab but repeat testing was negative and Hep B viral load was negative.  TTG 06/15/2013  ANA x 2 (05/07/11 and 06/13/2008) ASMA x2 (07/18/2011 negative & weakly + at 21 on 05/27/2011 & nega   OA (osteoarthritis)    Obesity, Class III, BMI 40-49.9 (morbid obesity) ([place])     Obstructive sleep apnea 08/21/08   Sleep study AHI 16.4 with desat to 66%. CPAP of 17 decreased AHI to 0.9. Non-compliant with CPAP.   RA (rheumatoid arthritis) ([place]) 2013   Shortness of breath    Tobacco abuse    Per pt, he has a diagnosis of COPD. Need to locate PFT's.   Wheelchair bound      Review of Systems:    Reports *** Denies *** (subjective fever?, pain anywhere?, bowel changes?)   Physical Exam:  There were no vitals filed for this visit.  General:   awake and alert, sitting comfortably in chair, cooperative, not in acute distress Skin:   warm and dry, intact without any obvious lesions or scars, no rashes Head:   normocephalic and atraumatic, oral mucosa moist with good dentition, no lymphadenopathy Eyes:   extraocular movements intact, conjunctivae pink, pupils round and reactive to light, no periorbital swelling or scleral icterus Ears:   pinnae normal, no discharge or external lesions  Nose:   symmetrical and without mucosal inflammation, no external lesions or discharge Lungs:   normal respiratory effort, breathing unlabored, symmetrical chest rise, no crackles or wheezing Cardiac:   regular rate and rhythm, normal S1 and S2, capillary refill 2-3 seconds, dorsalis pedis pulses intact bilaterally, no pitting edema Abdomen:   soft and non-distended, normoactive bowel sounds present in all four quadrants, no guarding or palpable masses Musculoskeletal:   full range of motion in joints, motor strength 5 /5 in all four extremities, no obvious deformities or joint tenderness Neurologic:   oriented to person-place-time, moving all extremities, sensation to light touch intact, no gross focal deficits Psychiatric:   euthymic mood with congruent affect, intelligible speech    Assessment & Plan:   No problem-specific Assessment & Plan notes found for this encounter.     See Encounters Tab for problem based charting.  Patient {GC/GE:[id number]::"discussed with","seen  with"} Dr. {NAMES:[id number]::"Guilloud","Hoffman","Mullen","Narendra","Williams","Vincent"}

## 2022-04-23 NOTE — Telephone Encounter (Signed)
Called patient, advised as he is to be seen on Thursday would like to look in his ears and see if he needs something stronger if the previous treatment did not work from last month.

## 2022-04-25 ENCOUNTER — Ambulatory Visit (INDEPENDENT_AMBULATORY_CARE_PROVIDER_SITE_OTHER): Payer: Medicaid Other | Admitting: Student

## 2022-04-25 DIAGNOSIS — I872 Venous insufficiency (chronic) (peripheral): Secondary | ICD-10-CM

## 2022-04-25 NOTE — Progress Notes (Addendum)
[facility] Telephone Encounter Continuity Care Appointment  HPI:  This telephone encounter was created for Andrew Fowler on 04/25/2022 for the following purpose/cc leg swelling.  AndrewAndrew Fowler is a 62 y.o. male with a past medical history of obesity, hypertension, hyperlipidemia, diabetes, tobacco use, COPD, RA, venous insufficiency, and chronic pain who presents for lower extremity swelling. He was last seen at [facility] in 02-2022.   Lower Extremity Swelling Patient has a history of lower extremity swelling of unclear etiology. Chronic venous insufficiency is likely contributory. Echocardiogram performed in 2021 demonstrated ejection fraction 60-65%. Ultrasound in 2021 revealed evidence of cirrhosis, recent laboratory results including elevated AST-ALT, low-normal platelets, and low albumin are consistent with early stage disease. He is prescribed furosemide '20mg'$  daily, which was recently 1-29 increased to twice daily for the same symptoms. Patient reports urinating more frequently since this adjustment and believes that his swelling has overall improved. His knees in particular, however, are are more swollen than usual and are limiting his mobility, which caused him to miss today's in-person appointment. Denies orthopnea and breath shortness. He reports drinking about 60oz of fluids daily, mostly water and Gatorade, and eating a lot of canned chicken soup. Counseled patient on importance of limiting fluid and salt intake, which are both present in high quantities in Gatorade and canned soup. Also advised patient to elevate, compress, and move his lower extremities to help improve circulation. Encouraged patient to visit [facility] next week for in-person visit so that we can assess his volume status, liver function, and electrolyte profile. Weight at last [facility] visit in 02-2022 was 280lbs.  - Continue furosemide '20mg'$  q12 - Limit intake of salts-fluids including Gatorade and  canned soup - Encourage elevation, compression, and movement of bilateral lower extremities - Return to clinic in about one week for volume status evaluation and laboratory testing   Otic Pruritus Patient also reports itching in his ears, for which he has been prescribed acetic acid otic drops. He continues to experience these symptoms despite taking these drops 3-4 times per day. Recommended maximum frequency is four times per day. This guideline was communicated to patient. Plan to reevaluate his ears during his next visit.  - Continue acetic acid 2% solution 3-5 drops q6 - Return to clinic in about one week for otic evaluation     Past Medical History:  Past Medical History:  Diagnosis Date   Abnormal laboratory test result 07/04/2011   Immunofixation electrophoresis 3/13 showed slightly restricted mobility in the IgG and kappa lanes and suggested repeat end of year.    Anxiety associated with depression 04/19/2011   On chronic benzos and SSRI's. Gets panic attacks. Does not see mental health.   Chronic pain syndrome 04/19/2011   Combination of RA, obesity, OA, & DDD. Has seen Dr Sharol Given & s/p R total hip arthroplasty 2/2 OA & ? AVN. s/p L4-L5 laminectomy. Lumbar MRI 4/11 : L3-L4 mod central canal narrowing.  Cervical MRI 6/11 : multi-level DDD and L foraminal stenosis at C4-5 and C5-6.    Chronic venous insufficiency 2013   ECHO 2013 was normal. LFT's, creatinine, TSH all normal. Alb a bit low.   COPD (chronic obstructive pulmonary disease) ([place])    Per pt, he has a diagnosis of COPD. No PFT's. Uses Alb MDI less than once a month.   Diabetes mellitus    Non-insulin dependent Type II.   Edema 04/19/2011   ECHO 2013 was normal. LFT's, creatinine, TSH all normal. Alb  a bit low. Likely 2/2 chronic venous insuff 2/2 weight.   Elevated liver function tests 05/17/2011   04/2010. Increased GGT (pt uses ETOH). AMA negative. ABD U/S limited but otherwise negative. Follow Alk phos level. AST & ALT  also elevated. False + IgM Hep B Core Ab. Hep B viral load negative.   Same pattern during hospitalization 2010 : highest alkaline phosphatase was 355, AST 259, and ALT 871. ANA, rheumatoid factor, ceruloplasmin, CMV IgM, alpha antitrypsin, and AMA, all within normal limits.   Encounter for power mobility device assessment 03/14/2020   Erythrocytosis 07/04/2016   Gout    Pt has never had a crystal diagnosis.   Hepatitis B antibody positive 2013   IgM Hep B core Ab + but Hepatitis B viral load negative.    Hyperlipidemia    At goal of LDL <100 with statin.   Hypertension    ACEI monotherapy   Leukocytosis 01/11/2014   Since 2010. Smear 2015 mild left shift. Is on prednisone for RA but increase started before leukocytosis.    NASH (nonalcoholic steatohepatitis), ? some EtOH contribution and fibrosis  05/17/2011   Elevated since 05/2008 during hospitalization. Relatively stable. Extensive W/U and no etiology but likely fatty liver 2/2 obesity despite normal imaging.  Hepatitis - all negative. There was an initial + IgM Hep B core Ab but repeat testing was negative and Hep B viral load was negative.  TTG 06/15/2013  ANA x 2 (05/07/11 and 06/13/2008) ASMA x2 (07/18/2011 negative & weakly + at 21 on 05/27/2011 & nega   OA (osteoarthritis)    Obesity, Class III, BMI 40-49.9 (morbid obesity) ([place])    Obstructive sleep apnea 08/21/08   Sleep study AHI 16.4 with desat to 66%. CPAP of 17 decreased AHI to 0.9. Non-compliant with CPAP.   RA (rheumatoid arthritis) ([place]) 2013   Shortness of breath    Tobacco abuse    Per pt, he has a diagnosis of COPD. Need to locate PFT's.   Wheelchair bound      ROS:  Reports lower extremity swelling, ear pruritus Denies orthopnea, breath shortness     Assessment / Plan / Recommendations:  Please see A&P under problem oriented charting for assessment of the patient's acute and chronic medical conditions.  As always, pt is advised that if symptoms worsen or new symptoms  arise, they should go to an urgent care facility or to to ER for further evaluation.   Consent and Medical Decision Making:  Patient discussed with Dr.  Saverio Danker This is a telephone encounter between [name] and [place] Nickel on 04/25/2022 for leg swelling. The visit was conducted with the patient located at home and [place] Nickel at [facility]. The patient's identity was confirmed using their DOB and current address. The patient has consented to being evaluated through a telephone encounter and understands the associated risks (an examination cannot be done and the patient may need to come in for an appointment) / benefits (allows the patient to remain at home, decreasing exposure to coronavirus). I personally spent 27 minutes on medical discussion.

## 2022-04-25 NOTE — Assessment & Plan Note (Deleted)
Patient has a history of lower extremity swelling of unclear etiology. Chronic venous insufficiency is likely contributory. Echocardiogram performed in 2021 demonstrated ejection fraction 60-65%. Ultrasound in 2021 revealed evidence of cirrhosis, recent laboratory results including elevated AST-ALT, low platelets, and low albumin are consistent with early stage disease. He is prescribed furosemide '20mg'$  daily, which was recently 1-29 increased to twice daily for the same symptoms. Patient reports urinating more frequently since this adjustment and believes that his swelling has overall improved. His knees in particular, however, are are more swollen than usual and are limiting his mobility, which caused him to miss today's in-person appointment. Denies orthopnea and breath shortness. He reports drinking about 60oz of fluids daily, mostly water and Gatorade, and eating a lot of canned chicken soup. Counseled patient on importance of limiting fluid and salt intake, which are both present in high quantities in Gatorade and canned soup. Also advised patient to elevate, compress, and move his lower extremities to help improve circulation. Encouraged patient to visit [facility] next week for in-person visit so that we can assess his volume status, liver function, and electrolyte profile. Weight at last [facility] visit in 02-2022 was 280lbs.  - Continue furosemide '20mg'$  q12 - Limit intake of salts-fluids including Gatorade and canned soup - Encourage elevation, compression, and movement of bilateral lower extremities - Return to clinic in about one week for volume status evaluation and laboratory testing

## 2022-04-29 NOTE — Progress Notes (Signed)
Internal Medicine Clinic Attending  Case discussed with Dr. Harper  At the time of the visit.  We reviewed the resident's history and exam and pertinent patient test results.  I agree with the assessment, diagnosis, and plan of care documented in the resident's note.  

## 2022-04-29 NOTE — Addendum Note (Signed)
Addended by: Charise Killian on: 04/29/2022 03:02 PM   Modules accepted: Level of Service

## 2022-05-02 ENCOUNTER — Observation Stay (HOSPITAL_COMMUNITY): Payer: Medicaid Other

## 2022-05-02 ENCOUNTER — Inpatient Hospital Stay (HOSPITAL_COMMUNITY)
Admission: EM | Admit: 2022-05-02 | Discharge: 2022-05-10 | DRG: 433 | Disposition: A | Discharge status: Home / Self Care | Payer: Medicaid Other | Attending: Internal Medicine | Admitting: Internal Medicine

## 2022-05-02 ENCOUNTER — Other Ambulatory Visit: Payer: Self-pay

## 2022-05-02 ENCOUNTER — Emergency Department (HOSPITAL_COMMUNITY): Payer: Medicaid Other

## 2022-05-02 ENCOUNTER — Encounter: Payer: Self-pay | Admitting: Internal Medicine

## 2022-05-02 ENCOUNTER — Ambulatory Visit: Payer: Medicaid Other | Admitting: Internal Medicine

## 2022-05-02 VITALS — BP 102/53 | HR 88 | Temp 97.4°F | Ht 71.0 in | Wt 357.2 lb

## 2022-05-02 DIAGNOSIS — J449 Chronic obstructive pulmonary disease, unspecified: Secondary | ICD-10-CM | POA: Diagnosis present

## 2022-05-02 DIAGNOSIS — E1169 Type 2 diabetes mellitus with other specified complication: Secondary | ICD-10-CM | POA: Diagnosis not present

## 2022-05-02 DIAGNOSIS — K7581 Nonalcoholic steatohepatitis (NASH): Secondary | ICD-10-CM | POA: Diagnosis not present

## 2022-05-02 DIAGNOSIS — R339 Retention of urine, unspecified: Secondary | ICD-10-CM | POA: Diagnosis not present

## 2022-05-02 DIAGNOSIS — E273 Drug-induced adrenocortical insufficiency: Secondary | ICD-10-CM | POA: Diagnosis present

## 2022-05-02 DIAGNOSIS — L299 Pruritus, unspecified: Secondary | ICD-10-CM | POA: Diagnosis present

## 2022-05-02 DIAGNOSIS — K7290 Hepatic failure, unspecified without coma: Secondary | ICD-10-CM | POA: Diagnosis present

## 2022-05-02 DIAGNOSIS — E871 Hypo-osmolality and hyponatremia: Secondary | ICD-10-CM | POA: Diagnosis not present

## 2022-05-02 DIAGNOSIS — E8770 Fluid overload, unspecified: Secondary | ICD-10-CM | POA: Diagnosis present

## 2022-05-02 DIAGNOSIS — R350 Frequency of micturition: Secondary | ICD-10-CM | POA: Diagnosis present

## 2022-05-02 DIAGNOSIS — G894 Chronic pain syndrome: Secondary | ICD-10-CM | POA: Diagnosis present

## 2022-05-02 DIAGNOSIS — E46 Unspecified protein-calorie malnutrition: Secondary | ICD-10-CM | POA: Diagnosis present

## 2022-05-02 DIAGNOSIS — E1143 Type 2 diabetes mellitus with diabetic autonomic (poly)neuropathy: Secondary | ICD-10-CM | POA: Diagnosis present

## 2022-05-02 DIAGNOSIS — F17210 Nicotine dependence, cigarettes, uncomplicated: Secondary | ICD-10-CM

## 2022-05-02 DIAGNOSIS — Z8249 Family history of ischemic heart disease and other diseases of the circulatory system: Secondary | ICD-10-CM

## 2022-05-02 DIAGNOSIS — E1165 Type 2 diabetes mellitus with hyperglycemia: Secondary | ICD-10-CM | POA: Diagnosis present

## 2022-05-02 DIAGNOSIS — I959 Hypotension, unspecified: Secondary | ICD-10-CM | POA: Diagnosis present

## 2022-05-02 DIAGNOSIS — J90 Pleural effusion, not elsewhere classified: Secondary | ICD-10-CM | POA: Diagnosis present

## 2022-05-02 DIAGNOSIS — M06019 Rheumatoid arthritis without rheumatoid factor, unspecified shoulder: Secondary | ICD-10-CM | POA: Diagnosis not present

## 2022-05-02 DIAGNOSIS — K7460 Unspecified cirrhosis of liver: Principal | ICD-10-CM | POA: Diagnosis present

## 2022-05-02 DIAGNOSIS — Z7952 Long term (current) use of systemic steroids: Secondary | ICD-10-CM

## 2022-05-02 DIAGNOSIS — Z993 Dependence on wheelchair: Secondary | ICD-10-CM

## 2022-05-02 DIAGNOSIS — Z1152 Encounter for screening for COVID-19: Secondary | ICD-10-CM

## 2022-05-02 DIAGNOSIS — Z538 Procedure and treatment not carried out for other reasons: Secondary | ICD-10-CM | POA: Diagnosis not present

## 2022-05-02 DIAGNOSIS — Z841 Family history of disorders of kidney and ureter: Secondary | ICD-10-CM

## 2022-05-02 DIAGNOSIS — I10 Essential (primary) hypertension: Secondary | ICD-10-CM | POA: Diagnosis not present

## 2022-05-02 DIAGNOSIS — R5381 Other malaise: Secondary | ICD-10-CM | POA: Diagnosis present

## 2022-05-02 DIAGNOSIS — R791 Abnormal coagulation profile: Secondary | ICD-10-CM | POA: Diagnosis present

## 2022-05-02 DIAGNOSIS — Z6841 Body Mass Index (BMI) 40.0 and over, adult: Secondary | ICD-10-CM

## 2022-05-02 DIAGNOSIS — R188 Other ascites: Secondary | ICD-10-CM | POA: Diagnosis not present

## 2022-05-02 DIAGNOSIS — R1013 Epigastric pain: Secondary | ICD-10-CM

## 2022-05-02 DIAGNOSIS — K5900 Constipation, unspecified: Secondary | ICD-10-CM | POA: Diagnosis present

## 2022-05-02 DIAGNOSIS — T380X5A Adverse effect of glucocorticoids and synthetic analogues, initial encounter: Secondary | ICD-10-CM | POA: Diagnosis present

## 2022-05-02 DIAGNOSIS — F32A Depression, unspecified: Secondary | ICD-10-CM | POA: Diagnosis present

## 2022-05-02 DIAGNOSIS — R601 Generalized edema: Secondary | ICD-10-CM

## 2022-05-02 DIAGNOSIS — K8020 Calculus of gallbladder without cholecystitis without obstruction: Secondary | ICD-10-CM | POA: Diagnosis present

## 2022-05-02 DIAGNOSIS — M7989 Other specified soft tissue disorders: Secondary | ICD-10-CM | POA: Diagnosis present

## 2022-05-02 DIAGNOSIS — D696 Thrombocytopenia, unspecified: Secondary | ICD-10-CM | POA: Diagnosis present

## 2022-05-02 DIAGNOSIS — M069 Rheumatoid arthritis, unspecified: Secondary | ICD-10-CM | POA: Diagnosis present

## 2022-05-02 DIAGNOSIS — R1084 Generalized abdominal pain: Secondary | ICD-10-CM

## 2022-05-02 DIAGNOSIS — Z79899 Other long term (current) drug therapy: Secondary | ICD-10-CM

## 2022-05-02 DIAGNOSIS — K209 Esophagitis, unspecified without bleeding: Secondary | ICD-10-CM | POA: Diagnosis present

## 2022-05-02 DIAGNOSIS — E785 Hyperlipidemia, unspecified: Secondary | ICD-10-CM | POA: Diagnosis present

## 2022-05-02 DIAGNOSIS — I272 Pulmonary hypertension, unspecified: Secondary | ICD-10-CM | POA: Diagnosis not present

## 2022-05-02 DIAGNOSIS — Z72820 Sleep deprivation: Secondary | ICD-10-CM

## 2022-05-02 DIAGNOSIS — Z809 Family history of malignant neoplasm, unspecified: Secondary | ICD-10-CM

## 2022-05-02 DIAGNOSIS — G4733 Obstructive sleep apnea (adult) (pediatric): Secondary | ICD-10-CM | POA: Diagnosis present

## 2022-05-02 DIAGNOSIS — R0602 Shortness of breath: Secondary | ICD-10-CM | POA: Diagnosis not present

## 2022-05-02 DIAGNOSIS — K920 Hematemesis: Secondary | ICD-10-CM | POA: Diagnosis not present

## 2022-05-02 DIAGNOSIS — Z96642 Presence of left artificial hip joint: Secondary | ICD-10-CM | POA: Diagnosis present

## 2022-05-02 DIAGNOSIS — F419 Anxiety disorder, unspecified: Secondary | ICD-10-CM | POA: Diagnosis present

## 2022-05-02 DIAGNOSIS — E2740 Unspecified adrenocortical insufficiency: Secondary | ICD-10-CM | POA: Diagnosis present

## 2022-05-02 DIAGNOSIS — I700 Atherosclerosis of aorta: Secondary | ICD-10-CM | POA: Diagnosis present

## 2022-05-02 DIAGNOSIS — K3184 Gastroparesis: Secondary | ICD-10-CM | POA: Diagnosis present

## 2022-05-02 DIAGNOSIS — Z7984 Long term (current) use of oral hypoglycemic drugs: Secondary | ICD-10-CM

## 2022-05-02 DIAGNOSIS — Z833 Family history of diabetes mellitus: Secondary | ICD-10-CM

## 2022-05-02 DIAGNOSIS — Y92009 Unspecified place in unspecified non-institutional (private) residence as the place of occurrence of the external cause: Secondary | ICD-10-CM

## 2022-05-02 LAB — URINALYSIS, W/ REFLEX TO CULTURE (INFECTION SUSPECTED)
Glucose, UA: NEGATIVE mg/dL
Hgb urine dipstick: NEGATIVE
Ketones, ur: NEGATIVE mg/dL
Leukocytes,Ua: NEGATIVE
Nitrite: NEGATIVE
Protein, ur: 30 mg/dL — AB
Specific Gravity, Urine: 1.024 (ref 1.005–1.030)
pH: 5 (ref 5.0–8.0)

## 2022-05-02 LAB — CBC WITH DIFFERENTIAL/PLATELET
Abs Immature Granulocytes: 0.06 10*3/uL (ref 0.00–0.07)
Basophils Absolute: 0.1 10*3/uL (ref 0.0–0.1)
Basophils Relative: 1 %
Eosinophils Absolute: 0.1 10*3/uL (ref 0.0–0.5)
Eosinophils Relative: 1 %
HCT: 41.2 % (ref 39.0–52.0)
Hemoglobin: 14.5 g/dL (ref 13.0–17.0)
Immature Granulocytes: 1 %
Lymphocytes Relative: 8 %
Lymphs Abs: 0.8 10*3/uL (ref 0.7–4.0)
MCH: 32.6 pg (ref 26.0–34.0)
MCHC: 35.2 g/dL (ref 30.0–36.0)
MCV: 92.6 fL (ref 80.0–100.0)
Monocytes Absolute: 0.9 10*3/uL (ref 0.1–1.0)
Monocytes Relative: 9 %
Neutro Abs: 8.7 10*3/uL — ABNORMAL HIGH (ref 1.7–7.7)
Neutrophils Relative %: 80 %
Platelets: 116 10*3/uL — ABNORMAL LOW (ref 150–400)
RBC: 4.45 MIL/uL (ref 4.22–5.81)
RDW: 16.4 % — ABNORMAL HIGH (ref 11.5–15.5)
WBC: 10.6 10*3/uL — ABNORMAL HIGH (ref 4.0–10.5)
nRBC: 0 % (ref 0.0–0.2)

## 2022-05-02 LAB — BASIC METABOLIC PANEL
Anion gap: 7 (ref 5–15)
BUN: 15 mg/dL (ref 6–20)
CO2: 25 mmol/L (ref 22–32)
Calcium: 8.1 mg/dL — ABNORMAL LOW (ref 8.9–10.3)
Chloride: 92 mmol/L — ABNORMAL LOW (ref 98–111)
Creatinine, Ser: 1.05 mg/dL (ref 0.61–1.24)
GFR, Estimated: >60 mL/min (ref >60)
Glucose, Bld: 91 mg/dL (ref 70–99)
Potassium: 4.3 mmol/L (ref 3.5–5.1)
Sodium: 124 mmol/L — ABNORMAL LOW (ref 135–145)

## 2022-05-02 LAB — HEPATIC FUNCTION PANEL
ALT: 63 U/L — ABNORMAL HIGH (ref 0–44)
AST: 135 U/L — ABNORMAL HIGH (ref 15–41)
Albumin: 2 g/dL — ABNORMAL LOW (ref 3.5–5.0)
Alkaline Phosphatase: 330 U/L — ABNORMAL HIGH (ref 38–126)
Bilirubin, Direct: 2.2 mg/dL — ABNORMAL HIGH (ref 0.0–0.2)
Indirect Bilirubin: 3 mg/dL — ABNORMAL HIGH (ref 0.3–0.9)
Total Bilirubin: 5.2 mg/dL — ABNORMAL HIGH (ref 0.3–1.2)
Total Protein: 6.3 g/dL — ABNORMAL LOW (ref 6.5–8.1)

## 2022-05-02 LAB — PROTIME-INR
INR: 1.4 — ABNORMAL HIGH (ref 0.8–1.2)
Prothrombin Time: 16.9 seconds — ABNORMAL HIGH (ref 11.4–15.2)

## 2022-05-02 LAB — BRAIN NATRIURETIC PEPTIDE: B Natriuretic Peptide: 58.8 pg/mL (ref 0.0–100.0)

## 2022-05-02 MED ORDER — SENNOSIDES-DOCUSATE SODIUM 8.6-50 MG PO TABS: 1.0000 | ORAL_TABLET | Freq: Every evening | ORAL | Status: DC | PRN

## 2022-05-02 MED ORDER — HYDROCODONE-ACETAMINOPHEN 10-325 MG PO TABS
1.0000 | ORAL_TABLET | Freq: Four times a day (QID) | ORAL | Status: DC | PRN
  Administered 2022-05-03 – 2022-05-09 (×8): 1 via ORAL
  Filled 2022-05-02 (×8): qty 1

## 2022-05-02 MED ORDER — TRIAMCINOLONE ACETONIDE 40 MG/ML IJ SUSP
40.0000 mg | Freq: Once | INTRAMUSCULAR | Status: DC
  Administered 2022-05-02: 40 mg via INTRA_ARTICULAR

## 2022-05-02 MED ORDER — ACETAMINOPHEN 650 MG RE SUPP: 650.0000 mg | Freq: Four times a day (QID) | RECTAL | Status: DC | PRN

## 2022-05-02 MED ORDER — ACETAMINOPHEN 325 MG PO TABS: 650.0000 mg | ORAL_TABLET | Freq: Four times a day (QID) | ORAL | Status: DC | PRN

## 2022-05-02 MED ORDER — FUROSEMIDE 40 MG PO TABS
40.0000 mg | ORAL_TABLET | Freq: Once | ORAL | Status: AC
  Administered 2022-05-02: 40 mg via ORAL
  Filled 2022-05-02: qty 1

## 2022-05-02 MED ORDER — ARFORMOTEROL TARTRATE 15 MCG/2ML IN NEBU
15.0000 ug | INHALATION_SOLUTION | Freq: Two times a day (BID) | RESPIRATORY_TRACT | Status: DC
  Administered 2022-05-02 – 2022-05-09 (×14): 15 ug via RESPIRATORY_TRACT
  Filled 2022-05-02 (×16): qty 2

## 2022-05-02 MED ORDER — UMECLIDINIUM BROMIDE 62.5 MCG/ACT IN AEPB
1.0000 | INHALATION_SPRAY | Freq: Every day | RESPIRATORY_TRACT | Status: DC
  Administered 2022-05-03 – 2022-05-09 (×6): 1 via RESPIRATORY_TRACT
  Filled 2022-05-02: qty 7

## 2022-05-02 MED ORDER — LIDOCAINE HCL (PF) 1 % IJ SOLN
2.0000 mL | Freq: Once | INTRAMUSCULAR | Status: DC
  Administered 2022-05-02: 2 mL

## 2022-05-02 MED ORDER — IPRATROPIUM-ALBUTEROL 0.5-2.5 (3) MG/3ML IN SOLN
3.0000 mL | RESPIRATORY_TRACT | Status: AC
  Administered 2022-05-02: 3 mL via RESPIRATORY_TRACT
  Filled 2022-05-02 (×2): qty 3

## 2022-05-02 MED ORDER — ENOXAPARIN SODIUM 80 MG/0.8ML IJ SOSY
0.5000 mg/kg | PREFILLED_SYRINGE | INTRAMUSCULAR | Status: DC
  Administered 2022-05-02 – 2022-05-06 (×5): 80 mg via SUBCUTANEOUS
  Filled 2022-05-02 (×6): qty 0.8

## 2022-05-02 MED ORDER — COSYNTROPIN 0.25 MG IJ SOLR
0.2500 mg | Freq: Once | INTRAMUSCULAR | Status: AC
  Administered 2022-05-03: 0.25 mg via INTRAVENOUS
  Filled 2022-05-02 (×2): qty 0.25

## 2022-05-02 NOTE — ED Triage Notes (Addendum)
Pt from internal medicine c/o sob, fluid retention; bladder scan greater than 1000 mls; endorses sob and swelling in abdomen and legs x 3-4 weeks; c/op mid back , abdominal , and bilateral leg pain 9/10

## 2022-05-02 NOTE — Assessment & Plan Note (Signed)
He has some mild end expiratory wheezing as well as scattered rhonchi I think hypervolemia is the main driver here

## 2022-05-02 NOTE — Assessment & Plan Note (Signed)
Weight is significantly up but I suspect this is all volume deferred A1c and further assessment of his diabetes due to acute illness and need for emergent evaluation in the emergency department.

## 2022-05-02 NOTE — Progress Notes (Signed)
Pt was unable to void. Bladder scan done - >999 ml in bladder; Dr Heber Bevington informed.

## 2022-05-02 NOTE — ED Provider Notes (Signed)
[facility] Provider Note   CSN: [id number] Arrival date & time: 05/02/22  1200     History  Chief Complaint  Patient presents with   Shortness of Breath    Andrew Fowler is a 62 y.o. male.  He has past medical history of rheumatoid arthritis, hypertension, hypertension, COPD, NASH, diabetes, and obesity.  He presents to the ED today from [facility] urinary retention and fluid retention 80 lb weight gain.  He reports been feeling unwell for the past couple of weeks and having some increased swelling in his legs and some shortness of breath.  His PCP doubled his Lasix.  He thought that helped a small amount but he states that over the past week he has had only small amounts of urine able to come out when he tries to urinate.  He denies fevers or chills he does admit to some pressure in his lower abdomen.    Shortness of Breath      Home Medications Prior to Admission medications   Medication Sig Start Date End Date Taking? Authorizing Provider  Adalimumab (HUMIRA PEN) 40 MG/0.4ML PNKT Inject into the skin.   Yes [provider]  ALPRAZolam Duanne Moron) 0.5 MG tablet TAKE 1 TABLET BY MOUTH 2 TIMES DAILY AS NEEDED FOR ANXIETY. 04/12/22  Yes Angelica Pou, MD  furosemide (LASIX) 20 MG tablet Take 1 tablet (20 mg total) by mouth daily. 03/05/22 03/05/23 Yes Farrel Gordon, DO  HYDROcodone-acetaminophen (NORCO) 10-325 MG tablet Take 1 tablet by mouth every 6 (six) hours as needed. 04/19/22  Yes Lucious Groves, DO  hydroxychloroquine (PLAQUENIL) 200 MG tablet Take 1 tablet by mouth 2 (two) times daily. 10/17/20  Yes [provider]  predniSONE (DELTASONE) 5 MG tablet Take 1 tablet (5 mg total) by mouth daily. 12/26/20  Yes [name], MD  Tiotropium Bromide-Olodaterol (STIOLTO RESPIMAT) 2.5-2.5 MCG/ACT AERS INHALE 2 PUFFS BY MOUTH INTO THE LUNGS DAILY 04/01/22  Yes Lucious Groves, DO  ACCU-CHEK  SOFTCLIX LANCETS lancets Check blood sugar once a day 12/25/17   Bartholomew Crews, MD  acetic acid 2 % otic solution Place 4 drops into both ears daily as needed. 03/05/22   Farrel Gordon, DO  albuterol (VENTOLIN HFA) 108 (90 Base) MCG/ACT inhaler INHALE 1-2 PUFFS INTO THE LUNGS EVERY 4 (FOUR) HOURS AS NEEDED. SHORTNESS OF BREATH 12/31/21   [name], MD  atorvastatin (LIPITOR) 40 MG tablet TAKE 1 TABLET BY MOUTH EVERY DAY 08/28/21   [name], MD  betamethasone valerate (VALISONE) 0.1 % cream Apply topically. 12/16/18   [provider]  Blood Glucose Monitoring Suppl (ACCU-CHEK AVIVA PLUS) w/Device KIT Check blood sugar once a day 04/19/16   Bartholomew Crews, MD  glucose blood (ACCU-CHEK AVIVA PLUS) test strip Check blood sugar once a day 12/25/17   Bartholomew Crews, MD  predniSONE (DELTASONE) 20 MG tablet Take 2 tablets (40 mg total) by mouth daily with breakfast. 03/05/22   Farrel Gordon, DO  varenicline (CHANTIX) 0.5 MG tablet Take 1 tablet (0.5 mg total) by mouth daily for 3 days, THEN 1 tablet (0.5 mg total) 2 (two) times daily for 4 days, THEN 2 tablets (1 mg total) 2 (two) times daily. 03/05/22 05/28/22  Farrel Gordon, DO      Allergies    Patient has no known allergies.    Review of Systems   Review of Systems  Respiratory:  Positive for shortness of breath.  Physical Exam Updated Vital Signs BP 116/66   Pulse 94   Temp (!) 97.5 F (36.4 C) (Oral)   Resp (!) 22   SpO2 100%  Physical Exam Vitals and nursing note reviewed.  Constitutional:      General: He is not in acute distress.    Appearance: He is well-developed.  HENT:     Head: Normocephalic and atraumatic.  Eyes:     Conjunctiva/sclera: Conjunctivae normal.  Cardiovascular:     Rate and Rhythm: Normal rate and regular rhythm.     Pulses: Normal pulses.     Heart sounds: No murmur heard.    Comments: Pitting edema noted from lower extremities to the upper abdomen Pulmonary:     Effort:  Tachypnea present.     Breath sounds: Examination of the right-middle field reveals rales. Examination of the left-middle field reveals rales. Examination of the right-lower field reveals rales. Examination of the left-lower field reveals rales. Rales present.  Abdominal:     Palpations: Abdomen is soft.     Tenderness: There is no abdominal tenderness.  Musculoskeletal:        General: No swelling.     Cervical back: Neck supple.     Right lower leg: 2+ Pitting Edema present.     Left lower leg: 2+ Pitting Edema present.  Skin:    General: Skin is warm and dry.     Capillary Refill: Capillary refill takes less than 2 seconds.  Neurological:     General: No focal deficit present.     Mental Status: He is alert.  Psychiatric:        Mood and Affect: Mood normal.     ED Results / Procedures / Treatments   Labs (all labs ordered are listed, but only abnormal results are displayed) Labs Reviewed  BASIC METABOLIC PANEL - Abnormal; Notable for the following components:      Result Value   Sodium 124 (*)    Chloride 92 (*)    Calcium 8.1 (*)    All other components within normal limits  CBC WITH DIFFERENTIAL/PLATELET - Abnormal; Notable for the following components:   WBC 10.6 (*)    RDW 16.4 (*)    Platelets 116 (*)    Neutro Abs 8.7 (*)    All other components within normal limits  URINALYSIS, W/ REFLEX TO CULTURE (INFECTION SUSPECTED) - Abnormal; Notable for the following components:   Color, Urine AMBER (*)    APPearance HAZY (*)    Bilirubin Urine SMALL (*)    Protein, ur 30 (*)    Bacteria, UA RARE (*)    All other components within normal limits  HEPATIC FUNCTION PANEL - Abnormal; Notable for the following components:   Total Protein 6.3 (*)    Albumin 2.0 (*)    AST 135 (*)    ALT 63 (*)    Alkaline Phosphatase 330 (*)    Total Bilirubin 5.2 (*)    Bilirubin, Direct 2.2 (*)    Indirect Bilirubin 3.0 (*)    All other components within normal limits  PROTIME-INR -  Abnormal; Notable for the following components:   Prothrombin Time 16.9 (*)    INR 1.4 (*)    All other components within normal limits  RESP PANEL BY RT-PCR (RSV, FLU A&B, COVID)  [id number]  BRAIN NATRIURETIC PEPTIDE  HIV ANTIBODY (ROUTINE TESTING W REFLEX)  COMPREHENSIVE METABOLIC PANEL  CBC  PROTIME-INR    EKG None  Radiology DG Chest [facility]  1 View  Result Date: 05/02/2022 CLINICAL DATA:  Shortness of breath. EXAM: PORTABLE CHEST 1 VIEW COMPARISON:  05/15/2012 FINDINGS: Interval removal of right IJ central venous catheter. Lungs are hypoinflated demonstrate mild hazy parahilar opacification right worse than left likely mild vascular congestion and less likely infection. No effusion. Cardiomediastinal silhouette and remainder of the exam is unchanged. IMPRESSION: Hypoinflation with mild hazy parahilar opacification right worse than left likely mild vascular congestion and less likely infection. Electronically Signed   By: Marin Olp M.D.   On: 05/02/2022 13:02    Procedures Ultrasound ED Abd  Date/Time: 05/02/2022 4:30 PM  Performed by: Gwenevere Abbot, PA-C Authorized by: Gwenevere Abbot, PA-C   Procedure details:    Indications: decreased urinary output     Scope of abdominal ultrasound: ascites.   Hepatobiliary:  Unable to visualize   Bladder:  Visualized       Study Limitations: body habitus Comments:     Ascites noted in RUQ in L mid abdomen     Medications Ordered in ED Medications  enoxaparin (LOVENOX) injection 80 mg (has no administration in time range)  acetaminophen (TYLENOL) tablet 650 mg (has no administration in time range)    Or  acetaminophen (TYLENOL) suppository 650 mg (has no administration in time range)  senna-docusate (Senokot-S) tablet 1 tablet (has no administration in time range)  HYDROcodone-acetaminophen (NORCO) 10-325 MG per tablet 1 tablet (has no administration in time range)  arformoterol (BROVANA) nebulizer solution 15 mcg (has no  administration in time range)    And  umeclidinium bromide (INCRUSE ELLIPTA) 62.5 MCG/ACT 1 puff (has no administration in time range)  ipratropium-albuterol (DUONEB) 0.5-2.5 (3) MG/3ML nebulizer solution 3 mL (has no administration in time range)    ED Course/ Medical Decision Making/ A&P                             Medical Decision Making This patient presents to the ED for concern of abdominal distention, decreased urinary output and edema, this involves an extensive number of treatment options, and is a complaint that carries with it a high risk of complications and morbidity.  The differential diagnosis includes CHF, ascites, urinary retention, renal failure with anasarca, other   Co morbidities that complicate the patient evaluation  Pulmonary hypertension, morbid obesity, COPD, NASH    Additional history obtained:  Additional history obtained from EMR External records from outside source obtained and reviewed including outpatient PCP notes, Echo from 2021 showing grade 1 diastolic dysfunction   Lab Tests:  I Ordered, and personally interpreted labs.  The pertinent results include: CBC is reassuring.  BMP shows sodium of 124, small bilirubin urine with dark color  Imaging Studies ordered:  I ordered imaging studies including x-ray I independently visualized and interpreted imaging which showed hazy opacification perihilar area right greater than left-moderate edema I agree with the radiologist interpretation   Cardiac Monitoring: / EKG:  The patient was maintained on a cardiac monitor.  I personally viewed and interpreted the cardiac monitored which showed an underlying rhythm of: Sinus rhythm   Consultations Obtained:  I requested consultation with the inpatient internal medicine teaching service Dr. Raymondo Band,  and discussed lab and imaging findings as well as pertinent plan - they recommend: admission   Problem List / ED Course / Critical interventions / Medication  management      Amount and/or Complexity of Data Reviewed Labs: ordered. Radiology: ordered.  Risk  Decision regarding hospitalization.           Final Clinical Impression(s) / ED Diagnoses Final diagnoses:  Other ascites  Generalized edema  Hyponatremia    Rx / DC Orders ED Discharge Orders     None         Darci Current 05/02/22 Mervyn Gay, MD 05/03/22 [phone]

## 2022-05-02 NOTE — Addendum Note (Signed)
Addended by: Joni Reining C on: 05/02/2022 12:10 PM   Modules accepted: Orders, Level of Service

## 2022-05-02 NOTE — Assessment & Plan Note (Signed)
Blood pressure well controlled

## 2022-05-02 NOTE — Hospital Course (Addendum)
2/16: Wants to go home. He feels better. Not getting anything done here that he cannot do at home. He would rather go home and get therapy at home.

## 2022-05-02 NOTE — Assessment & Plan Note (Signed)
Would recommend rechecking LFTs as well as platelet count and INR I do not see any obvious gross ascites on my brief POCUS examination.

## 2022-05-02 NOTE — Assessment & Plan Note (Signed)
Patient is volume overloaded and failure of diuretics this may been partially due to his acute urinary retention I have directed him up to the emergency department and spoke with the charge nurse.

## 2022-05-02 NOTE — ED Notes (Signed)
..ED TO INPATIENT HANDOFF REPORT  ED Nurse Name and Phone #:  [phone]   S Name/Age/Gender Andrew Fowler 62 y.o. male Room/Bed: 010C/010C  Code Status   Code Status: Full Code  Home/SNF/Other Home Patient oriented to: self, place, time, and situation Is this baseline? Yes   Triage Complete: Triage complete  Chief Complaint Fluid overload [E87.70]  Triage Note Pt from internal medicine c/o sob, fluid retention; bladder scan greater than 1000 mls; endorses sob and swelling in abdomen and legs x 3-4 weeks; c/op mid back , abdominal , and bilateral leg pain 9/10   Allergies No Known Allergies  Level of Care/Admitting Diagnosis ED Disposition     ED Disposition  Admit   Condition  --   Comment  Hospital Area: [facility] [[id number]]  Level of Care: Med-Surg [16]  May place patient in observation at [facility] or [facility] if equivalent level of care is available:: No  Covid Evaluation: Asymptomatic - no recent exposure (last 10 days) testing not required  Diagnosis: Fluid overload [193790]  Admitting Physician: Axel Filler [[id number]]  Attending Physician: Axel Filler [[id number]]          B Medical/Surgery History Past Medical History:  Diagnosis Date   Abnormal laboratory test result 07/04/2011   Immunofixation electrophoresis 3/13 showed slightly restricted mobility in the IgG and kappa lanes and suggested repeat end of year.    Anxiety associated with depression 04/19/2011   On chronic benzos and SSRI's. Gets panic attacks. Does not see mental health.   Chronic pain syndrome 04/19/2011   Combination of RA, obesity, OA, & DDD. Has seen Dr Sharol Given & s/p R total hip arthroplasty 2/2 OA & ? AVN. s/p L4-L5 laminectomy. Lumbar MRI 4/11 : L3-L4 mod central canal narrowing.  Cervical MRI 6/11 : multi-level DDD and L foraminal stenosis at C4-5 and C5-6.    Chronic venous insufficiency 2013   ECHO 2013 was normal. LFT's, creatinine, TSH  all normal. Alb a bit low.   COPD (chronic obstructive pulmonary disease) ([place])    Per pt, he has a diagnosis of COPD. No PFT's. Uses Alb MDI less than once a month.   Diabetes mellitus    Non-insulin dependent Type II.   Edema 04/19/2011   ECHO 2013 was normal. LFT's, creatinine, TSH all normal. Alb a bit low. Likely 2/2 chronic venous insuff 2/2 weight.   Elevated liver function tests 05/17/2011   04/2010. Increased GGT (pt uses ETOH). AMA negative. ABD U/S limited but otherwise negative. Follow Alk phos level. AST & ALT also elevated. False + IgM Hep B Core Ab. Hep B viral load negative.   Same pattern during hospitalization 2010 : highest alkaline phosphatase was 355, AST 259, and ALT 871. ANA, rheumatoid factor, ceruloplasmin, CMV IgM, alpha antitrypsin, and AMA, all within normal limits.   Encounter for power mobility device assessment 03/14/2020   Erythrocytosis 07/04/2016   Gout    Pt has never had a crystal diagnosis.   Hepatitis B antibody positive 2013   IgM Hep B core Ab + but Hepatitis B viral load negative.    Hyperlipidemia    At goal of LDL <100 with statin.   Hypertension    ACEI monotherapy   Leukocytosis 01/11/2014   Since 2010. Smear 2015 mild left shift. Is on prednisone for RA but increase started before leukocytosis.    NASH (nonalcoholic steatohepatitis), ? some EtOH contribution and fibrosis  05/17/2011   Elevated since 05/2008  during hospitalization. Relatively stable. Extensive W/U and no etiology but likely fatty liver 2/2 obesity despite normal imaging.  Hepatitis - all negative. There was an initial + IgM Hep B core Ab but repeat testing was negative and Hep B viral load was negative.  TTG 06/15/2013  ANA x 2 (05/07/11 and 06/13/2008) ASMA x2 (07/18/2011 negative & weakly + at 21 on 05/27/2011 & nega   OA (osteoarthritis)    Obesity, Class III, BMI 40-49.9 (morbid obesity) ([place])    Obstructive sleep apnea 08/21/08   Sleep study AHI 16.4 with desat to 66%. CPAP of 17  decreased AHI to 0.9. Non-compliant with CPAP.   RA (rheumatoid arthritis) ([place]) 2013   Shortness of breath    Tobacco abuse    Per pt, he has a diagnosis of COPD. Need to locate PFT's.   Wheelchair bound    Past Surgical History:  Procedure Laterality Date   LAMINECTOMY  1995   L4-L5   TONSILLECTOMY     TOTAL HIP ARTHROPLASTY Left 2009     A IV Location/Drains/Wounds Patient Lines/Drains/Airways Status     Active Line/Drains/Airways     Name Placement date Placement time Site Days   Peripheral IV 05/19/19 Left Antecubital 05/19/19  1118  Antecubital  1079   Peripheral IV 05/02/22 22 G Anterior;Right Forearm 05/02/22  1558  Forearm  less than 1   Urethral Catheter Tina Covington Straight-tip 16 Fr. 05/02/22  1500  Straight-tip  less than 1   Pressure Ulcer 06/13/11 Stage II -  Partial thickness loss of dermis presenting as a shallow open ulcer with a red, pink wound bed without slough. 06/13/11  1814  -- 3976            Intake/Output Last 24 hours No intake or output data in the 24 hours ending 05/02/22 1743  Labs/Imaging Results for orders placed or performed during the hospital encounter of 05/02/22 (from the past 48 hour(s))  Basic metabolic panel     Status: Abnormal   Collection Time: 05/02/22  1:15 PM  Result Value Ref Range   Sodium 124 (L) 135 - 145 mmol/L   Potassium 4.3 3.5 - 5.1 mmol/L    Comment: HEMOLYSIS AT THIS LEVEL MAY AFFECT RESULT   Chloride 92 (L) 98 - 111 mmol/L   CO2 25 22 - 32 mmol/L   Glucose, Bld 91 70 - 99 mg/dL    Comment: Glucose reference range applies only to samples taken after fasting for at least 8 hours.   BUN 15 6 - 20 mg/dL   Creatinine, Ser 1.05 0.61 - 1.24 mg/dL   Calcium 8.1 (L) 8.9 - 10.3 mg/dL   GFR, Estimated >60 >60 mL/min    Comment: (NOTE) Calculated using the CKD-EPI Creatinine Equation (2021)    Anion gap 7 5 - 15    Comment: Performed at [facility] [address], [place], [place] [address]  CBC with  Differential     Status: Abnormal   Collection Time: 05/02/22  1:15 PM  Result Value Ref Range   WBC 10.6 (H) 4.0 - 10.5 K/uL   RBC 4.45 4.22 - 5.81 MIL/uL   Hemoglobin 14.5 13.0 - 17.0 g/dL   HCT 41.2 39.0 - 52.0 %   MCV 92.6 80.0 - 100.0 fL   MCH 32.6 26.0 - 34.0 pg   MCHC 35.2 30.0 - 36.0 g/dL   RDW 16.4 (H) 11.5 - 15.5 %   Platelets 116 (L) 150 - 400 K/uL  nRBC 0.0 0.0 - 0.2 %   Neutrophils Relative % 80 %   Neutro Abs 8.7 (H) 1.7 - 7.7 K/uL   Lymphocytes Relative 8 %   Lymphs Abs 0.8 0.7 - 4.0 K/uL   Monocytes Relative 9 %   Monocytes Absolute 0.9 0.1 - 1.0 K/uL   Eosinophils Relative 1 %   Eosinophils Absolute 0.1 0.0 - 0.5 K/uL   Basophils Relative 1 %   Basophils Absolute 0.1 0.0 - 0.1 K/uL   Immature Granulocytes 1 %   Abs Immature Granulocytes 0.06 0.00 - 0.07 K/uL    Comment: Performed at [facility] [address], [place], [place] [address]  Brain natriuretic peptide     Status: None   Collection Time: 05/02/22  1:15 PM  Result Value Ref Range   B Natriuretic Peptide 58.8 0.0 - 100.0 pg/mL    Comment: Performed at [facility] [address] [place], [place] [address]  Urinalysis, w/ Reflex to Culture (Infection Suspected) -Urine, Catheterized     Status: Abnormal   Collection Time: 05/02/22  2:59 PM  Result Value Ref Range   Specimen Source URINE, CATHETERIZED    Color, Urine AMBER (A) YELLOW    Comment: BIOCHEMICALS MAY BE AFFECTED BY COLOR   APPearance HAZY (A) CLEAR   Specific Gravity, Urine 1.024 1.005 - 1.030   pH 5.0 5.0 - 8.0   Glucose, UA NEGATIVE NEGATIVE mg/dL   Hgb urine dipstick NEGATIVE NEGATIVE   Bilirubin Urine SMALL (A) NEGATIVE   Ketones, ur NEGATIVE NEGATIVE mg/dL   Protein, ur 30 (A) NEGATIVE mg/dL   Nitrite NEGATIVE NEGATIVE   Leukocytes,Ua NEGATIVE NEGATIVE   RBC / HPF 0-5 0 - 5 RBC/hpf   WBC, UA 0-5 0 - 5 WBC/hpf    Comment:        Reflex urine culture not performed if WBC <=10, OR if Squamous epithelial cells  >5. If Squamous epithelial cells >5 suggest recollection.    Bacteria, UA RARE (A) NONE SEEN   Squamous Epithelial / HPF 0-5 0 - 5 /HPF   Mucus PRESENT    Hyaline Casts, UA PRESENT    Sperm, UA PRESENT     Comment: Performed at [facility], [place] [address] [place], [place] [address]  Hepatic function panel     Status: Abnormal   Collection Time: 05/02/22  3:55 PM  Result Value Ref Range   Total Protein 6.3 (L) 6.5 - 8.1 g/dL   Albumin 2.0 (L) 3.5 - 5.0 g/dL   AST 135 (H) 15 - 41 U/L    Comment: HEMOLYSIS AT THIS LEVEL MAY AFFECT RESULT   ALT 63 (H) 0 - 44 U/L    Comment: HEMOLYSIS AT THIS LEVEL MAY AFFECT RESULT   Alkaline Phosphatase 330 (H) 38 - 126 U/L   Total Bilirubin 5.2 (H) 0.3 - 1.2 mg/dL    Comment: HEMOLYSIS AT THIS LEVEL MAY AFFECT RESULT   Bilirubin, Direct 2.2 (H) 0.0 - 0.2 mg/dL   Indirect Bilirubin 3.0 (H) 0.3 - 0.9 mg/dL    Comment: Performed at [facility], [place] [address] [place], [place] [address]  Protime-INR     Status: Abnormal   Collection Time: 05/02/22  3:55 PM  Result Value Ref Range   Prothrombin Time 16.9 (H) 11.4 - 15.2 seconds   INR 1.4 (H) 0.8 - 1.2    Comment: (NOTE) INR goal varies based on device and disease states. Performed at [facility]  [facility], [place] [address] [place], [place] [address]    DG Chest Port 1 View  Result Date: 05/02/2022 CLINICAL DATA:  Shortness of breath. EXAM: PORTABLE CHEST 1 VIEW COMPARISON:  05/15/2012 FINDINGS: Interval removal of right IJ central venous catheter. Lungs are hypoinflated demonstrate mild hazy parahilar opacification right worse than left likely mild vascular congestion and less likely infection. No effusion. Cardiomediastinal silhouette and remainder of the exam is unchanged. IMPRESSION: Hypoinflation with mild hazy parahilar opacification right worse than left likely mild vascular congestion and less likely infection. Electronically Signed   By: Marin Olp M.D.   On: 05/02/2022 13:02     Pending Labs Unresulted Labs (From admission, onward)     Start     Ordered   05/03/22 0500  HIV Antibody (routine testing w rflx)  (HIV Antibody (Routine testing w reflex) panel)  Tomorrow morning,   R        05/02/22 1717   05/03/22 0500  Comprehensive metabolic panel  Tomorrow morning,   R        05/02/22 1717   05/03/22 0500  CBC  Tomorrow morning,   R        05/02/22 1717   05/03/22 0500  Protime-INR  Tomorrow morning,   R        05/02/22 1717   05/02/22 1723  Resp panel by RT-PCR (RSV, Flu A&B, Covid) Anterior Nasal Swab  (Tier 2 - SymptomaticResp panel by RT-PCR (RSV, Flu A&B, Covid))  Once,   R        05/02/22 1723            Vitals/Pain Today's Vitals   05/02/22 1645 05/02/22 1700 05/02/22 1715 05/02/22 1730  BP: (!) 158/122  112/74 116/66  Pulse: 92 96 96 94  Resp: (!) '24 15 17 '$ (!) 22  Temp:      TempSrc:      SpO2: 98% 96% 97% 100%  PainSc:        Isolation Precautions Airborne and Contact precautions  Medications Medications  enoxaparin (LOVENOX) injection 80 mg (has no administration in time range)  acetaminophen (TYLENOL) tablet 650 mg (has no administration in time range)    Or  acetaminophen (TYLENOL) suppository 650 mg (has no administration in time range)  senna-docusate (Senokot-S) tablet 1 tablet (has no administration in time range)  HYDROcodone-acetaminophen (NORCO) 10-325 MG per tablet 1 tablet (has no administration in time range)  arformoterol (BROVANA) nebulizer solution 15 mcg (has no administration in time range)    And  umeclidinium bromide (INCRUSE ELLIPTA) 62.5 MCG/ACT 1 puff (has no administration in time range)  ipratropium-albuterol (DUONEB) 0.5-2.5 (3) MG/3ML nebulizer solution 3 mL (has no administration in time range)    Mobility manual wheelchair     Focused Assessments Cardiac Assessment Handoff:    Lab Results  Component Value Date   CKTOTAL 19 06/14/2011   CKMB 1.1 06/14/2011   TROPONINI <0.30 05/15/2012    Lab Results  Component Value Date   DDIMER (H) 06/10/2008    0.75        AT THE INHOUSE ESTABLISHED CUTOFF VALUE OF 0.48 ug/mL FEU, THIS ASSAY HAS BEEN DOCUMENTED IN THE LITERATURE TO HAVE A SENSITIVITY AND NEGATIVE PREDICTIVE VALUE OF AT LEAST 98 TO 99%.  THE TEST RESULT SHOULD BE CORRELATED WITH AN ASSESSMENT OF THE CLINICAL PROBABILITY OF DVT / VTE.   Does the Patient currently have chest pain? No   , Neuro Assessment Handoff:  Swallow screen  pass? Yes          Neuro Assessment:   Neuro Checks:      Has TPA been given? No If patient is a Neuro Trauma and patient is going to OR before floor call report to [facility] nurse: [phone] or [phone]  , Pulmonary Assessment Handoff:  Lung sounds:   O2 Device: Room Air      R Recommendations: See Admitting Provider Note  Report given to:   Additional Notes: NA

## 2022-05-02 NOTE — Assessment & Plan Note (Signed)
O2 stats in reasonable range when at rest.  No current need for supplemental oxygen.

## 2022-05-02 NOTE — Assessment & Plan Note (Signed)
Weight significantly up mostly due to volume overload

## 2022-05-02 NOTE — Assessment & Plan Note (Addendum)
He has rescheduled his follow-up with [facility] rheumatology to later this month would be fine to continue a bridge of prednisone 5 however I will hold off prescribing this as I directed him to the emergency department.  I did perform a kenalog steroid injection for his right shoulder today.

## 2022-05-02 NOTE — H&P (Addendum)
Date: 05/02/2022               Patient Name:  Andrew Fowler MRN: [id number]  DOB: 10-13-1960 Age / Sex: 62 y.o., male   PCP: Lucious Groves, DO              Medical Service: Internal Medicine Teaching Service              Attending Physician: Dr. Evette Doffing, Mallie Mussel, *    First Contact: Spero Curb, MS 3 Pager: [phone]  Second Contact: Dr. Zack Seal Pager: [phone]  Third Contact Dr. Buddy Duty Pager: [phone]       After Hours (After 5p/  First Contact Pager: [phone]  weekends / holidays): Second Contact Pager: [phone]   Chief Complaint: Worsening swelling  History of Present Illness:   Andrew Fowler is a 62 year old man with history of cirrhosis, COPD, OSA, Rheumatoid arthritis, T2DM who presents for worsening leg and abdominal swelling. Patient saw Dr. Heber [place] in clinic today and was sent for evaluation in the ED. Patient states that he noticed his legs started to swell about three weeks ago after a viral illness, and the swelling has since extended to his abdomen over the last 1.5 weeks. His dose of furosemide was increased two weeks ago, but he states that he has had decreased urination over the last 1.5 months with improvement in swelling or increased urination on furosemide. He has gained 77 lb from last clinic visit in December to today.   He also endorses increased dyspnea with exertion and increased sputum production, denies orthopnea or PND. He endorses dysgeusia over the last few months as well as decreased appetite. He endorses cold intolerance. Patient denies fever, nausea or vomiting, or changes in bowel habits. Denies history of heart disease or kidney disease. He states he usually takes prednisone 35m for RA but has not been taking this for a while.  Patient denies alcohol use or recreational drug use. Denies history of IV drug use. Current smoker, about 1/2 pack/day. Gets around using an electric wheelchair.   [facility]    Meds:   Current Meds  Medication Sig   Adalimumab (HUMIRA PEN) 40 MG/0.4ML PNKT Inject into the skin.   ALPRAZolam (XANAX) 0.5 MG tablet TAKE 1 TABLET BY MOUTH 2 TIMES DAILY AS NEEDED FOR ANXIETY.   furosemide (LASIX) 20 MG tablet Take 1 tablet (20 mg total) by mouth daily.   HYDROcodone-acetaminophen (NORCO) 10-325 MG tablet Take 1 tablet by mouth every 6 (six) hours as needed.   hydroxychloroquine (PLAQUENIL) 200 MG tablet Take 1 tablet by mouth 2 (two) times daily.   predniSONE (DELTASONE) 5 MG tablet Take 1 tablet (5 mg total) by mouth daily.   Tiotropium Bromide-Olodaterol (STIOLTO RESPIMAT) 2.5-2.5 MCG/ACT AERS INHALE 2 PUFFS BY MOUTH INTO THE LUNGS DAILY     Allergies: Allergies as of 05/02/2022   (No Known Allergies)   Past Medical History:  Diagnosis Date   Abnormal laboratory test result 07/04/2011   Immunofixation electrophoresis 3/13 showed slightly restricted mobility in the IgG and kappa lanes and suggested repeat end of year.    Anxiety associated with depression 04/19/2011   On chronic benzos and SSRI's. Gets panic attacks. Does not see mental health.   Chronic pain syndrome 04/19/2011   Combination of RA, obesity, OA, & DDD. Has seen Dr DSharol Given& s/p R total hip arthroplasty 2/2 OA & ? AVN. s/p L4-L5 laminectomy. Lumbar MRI 4/11 :  L3-L4 mod central canal narrowing.  Cervical MRI 6/11 : multi-level DDD and L foraminal stenosis at C4-5 and C5-6.    Chronic venous insufficiency 2013   ECHO 2013 was normal. LFT's, creatinine, TSH all normal. Alb a bit low.   COPD (chronic obstructive pulmonary disease) ([place])    Per pt, he has a diagnosis of COPD. No PFT's. Uses Alb MDI less than once a month.   Diabetes mellitus    Non-insulin dependent Type II.   Edema 04/19/2011   ECHO 2013 was normal. LFT's, creatinine, TSH all normal. Alb a bit low. Likely 2/2 chronic venous insuff 2/2 weight.   Elevated liver function tests 05/17/2011   04/2010. Increased GGT (pt uses ETOH). AMA negative. ABD U/S  limited but otherwise negative. Follow Alk phos level. AST & ALT also elevated. False + IgM Hep B Core Ab. Hep B viral load negative.   Same pattern during hospitalization 2010 : highest alkaline phosphatase was 355, AST 259, and ALT 871. ANA, rheumatoid factor, ceruloplasmin, CMV IgM, alpha antitrypsin, and AMA, all within normal limits.   Encounter for power mobility device assessment 03/14/2020   Erythrocytosis 07/04/2016   Gout    Pt has never had a crystal diagnosis.   Hepatitis B antibody positive 2013   IgM Hep B core Ab + but Hepatitis B viral load negative.    Hyperlipidemia    At goal of LDL <100 with statin.   Hypertension    ACEI monotherapy   Leukocytosis 01/11/2014   Since 2010. Smear 2015 mild left shift. Is on prednisone for RA but increase started before leukocytosis.    NASH (nonalcoholic steatohepatitis), ? some EtOH contribution and fibrosis  05/17/2011   Elevated since 05/2008 during hospitalization. Relatively stable. Extensive W/U and no etiology but likely fatty liver 2/2 obesity despite normal imaging.  Hepatitis - all negative. There was an initial + IgM Hep B core Ab but repeat testing was negative and Hep B viral load was negative.  TTG 06/15/2013  ANA x 2 (05/07/11 and 06/13/2008) ASMA x2 (07/18/2011 negative & weakly + at 21 on 05/27/2011 & nega   OA (osteoarthritis)    Obesity, Class III, BMI 40-49.9 (morbid obesity) ([place])    Obstructive sleep apnea 08/21/08   Sleep study AHI 16.4 with desat to 66%. CPAP of 17 decreased AHI to 0.9. Non-compliant with CPAP.   RA (rheumatoid arthritis) ([place]) 2013   Shortness of breath    Tobacco abuse    Per pt, he has a diagnosis of COPD. Need to locate PFT's.   Wheelchair bound    Surgical History:  Hip and back surgery. Denies history of abdominal surgeries.   Family History: Family history notable for diabetes and cancer in mother's side. No family history of heart problems, strokes, liver problems.   Social History: Patient  states he currently lives in [place] and lives with his cousin and cousin's wife. Used to work in Materials engineer. Dependent for iADL, more recently dependent for ADL like toileting and bathing as well. Transportation via [place].  Review of Systems: A complete ROS was negative except as per HPI.   Physical Exam: Blood pressure (!) 113/57, pulse 96, temperature (!) 97.5 F (36.4 C), temperature source Oral, resp. rate 19, height 5' 11"$  (1.803 m), weight (!) 159.7 kg, SpO2 94 %.  General: Alert, cooperative. HEENT: Poor dentition. Mild scleral icterus.  CV: No JVD appreciated. RRR (distant heart sounds), no murmurs, rubs, or gallops Pulm: Slight increase in work of breathing.  Wheezing in all lung fields bilaterally.  Abd: Normoactive bowel sounds. Firm but non-rigid, very distended with striae. Dullness to percussion on lateral sides of abdomen, tender to palpation on lateral sides. No rigidity or guarding.  Skin: Warm and dry.  Ext: 2+ pitting edema bilaterally. Contractures in fingers on left hand consistent with swan neck deformity.  Neuro: A&Ox4 Psych: Pleasant, appropriate affect  EKG: personally reviewed my interpretation is sinus rhythm, low voltage (rate 90, prolonged PR - 244m, normal Qtc 440, no axis deviation)  CXR: personally reviewed my interpretation is hypoinflation with congestion. No focal consolidations, effusions, or atelectasis noted.   Hepatic Function Panel     Component Value Date/Time   PROT 6.3 (L) 05/02/2022 1555   PROT 6.6 03/05/2022 1127   ALBUMIN 2.0 (L) 05/02/2022 1555   ALBUMIN 2.8 (L) 03/05/2022 1127   AST 135 (H) 05/02/2022 1555   ALT 63 (H) 05/02/2022 1555   ALKPHOS 330 (H) 05/02/2022 1555   BILITOT 5.2 (H) 05/02/2022 1555   BILITOT 1.4 (H) 03/05/2022 1127   BILIDIR 2.2 (H) 05/02/2022 1555   IBILI 3.0 (H) 05/02/2022 1555      Latest Ref Rng & Units 05/02/2022    3:55 PM 05/02/2022    1:15 PM 03/05/2022   11:27 AM  CMP  Glucose 70 - 99 mg/dL  91  87    BUN 6 - 20 mg/dL  15  10   Creatinine 0.61 - 1.24 mg/dL  1.05  0.75   Sodium 135 - 145 mmol/L  124  133   Potassium 3.5 - 5.1 mmol/L  4.3  3.9   Chloride 98 - 111 mmol/L  92  97   CO2 22 - 32 mmol/L  25  23   Calcium 8.9 - 10.3 mg/dL  8.1  8.7   Total Protein 6.5 - 8.1 g/dL 6.3   6.6   Total Bilirubin 0.3 - 1.2 mg/dL 5.2   1.4   Alkaline Phos 38 - 126 U/L 330   407   AST 15 - 41 U/L 135   84   ALT 0 - 44 U/L 63   50    Prothrombin - 16.9 INR - 1.4   Assessment & Plan by Problem: Principal Problem:   Decompensated hepatic cirrhosis (HCC) Active Problems:   Fluid overload  Andrew Fowler old man with history significant for cirrhosis, COPD, OSA, Rheumatoid arthritis, T2DM who presents for 3 weeks of acutely worsening edema and increase in abdominal girth admitted for management of likely acute decompensated cirrhosis.   Decompensated Cirrhosis 2/2 NASH Hyponatremia 2/2 above Patient presents for three weeks of worsening edema, significant weight gain, and dyspnea that has not responded to outpatient diuresis. He had been taking lasix 20 mg daily and recently doubled that to take 20 mg twice a day. Patient has a history of chronically elevated LFTs since 2010 and has had extensive negative workup including negative ANA, ASMA, AMA, ceruloplasmin, alpha-1 antitrypsin, and hepatitis panel. Biopsy in 2015 showed evidence of hepatic steatosis and moderate fibrosis, repeat biopsy in 2021 showed sinusoidal dilatation with fibrosis. Patient denies any alcohol use. No known prior history of heart or renal disease.  Labs significant for Na 124, T bili 5.2, Albumin 2, AST 135 ALT 63. PT and INR elevated. Patient's overall history of liver fibrosis, acute presentation and objective findings are consistent with an acute decompensated cirrhosis from chronic advancing liver fibrosis, with recent trigger of a viral illness. MELD 26 (  20% 18-monthportality), CPS 11, poor overall prognosis. Low  concern for SBP at this time, patient has been borderline hypotensive but otherwise stable and afebrile. Lower on the differential is heart failure or cardiogenic etiology, patient denies orthopnea or PND and BNP is normal, no ischemic changes on EKG. Plan to treat ascites and minimize further complications, will continue to monitor clinical status. Will obtain formal RUQ ultrasound, although POCUS was utilized in the clinic today and no large volume of ascites was appreciated. -Low sodium diet with fluid restriction -RUQ ultrasound, consider paracentesis if large volume ascites -Furosemide 437mPO today -Consider addition of spironolactone -Echo to r/o heart failure -Avoid hepatotoxic agents -CMP in the morning  -PT/INR in the morning  Urinary retention Patient is volume overloaded and failed outpatient diuretics. He has had a decrease in urinary frequency, although denies any dysuria. [facility], although when he had a foley placed in the ED, not much output was noted.  -Continue foley catheter -Voiding trial once volume status improves  Chronic Steroid Use Rheumatoid Arthritis  Patient taking chronic prednisone 5 mg for rheumatoid arthritis, although says he has not taken this in a while and patient has been unable to refill per chart review. Patient is borderline hypotensive and hyponatremic, with evidence of hyperpigmentation and abdominal striae. Although cirrhosis is most likely driving his acute presentation, he may have an element of adrenal insufficiency. Plan to restart home prednisone tomorrow.  -Cosyntropin stimulation test in AM  COPD Patient states he is having worsening dyspnea, cough, and increased sputum production. He does not use supplemental O2 at home. Will plan to hold off on treating symptoms as COPD exacerbation as dyspnea is likely driven by ascites, however he could have some concurrent worsening of COPD symptoms. Will start  nebulizer for symptom relief and hold prednisone for cosyntropin test. He is saturating well on room air, although does have wheezing.  -Brovana nebulizer 79m16mID and Incruse Ellipta 1puff QD -Duoneb 3mL 65m -Respiratory panel ordered  Chronic Pain syndrome Patient has history of chronic hip pain treated with Norco.  -Restart home Norco 10-325mg 70mblet q6h PRN  Type 2 diabetes A1c in December was 6.1% - patient is off all medications, as his A1c has been very well controlled.   Full Code Brother, David,Shanon BrowurrogAir traffic controller: Lovenox IVF: None Diet: Low sodium Family Updates: Brother at bedside  Dispo: Admit patient to Inpatient with expected length of stay greater than 2 midnights.   Attestation for Student Documentation:  I personally was present and performed or re-performed the history, physical exam and medical decision-making activities of this service and have verified that the service and findings are accurately documented in the student's note.  Elaynah Virginia,Dorethea Clan/10/2022, 9:24 PM  Signed: Aribelle Mccosh,Dorethea Clan/10/2022, 9:14 PM  Pager: [phone]

## 2022-05-02 NOTE — Progress Notes (Signed)
Pt accompanied to ER via pt's Jazzy; pt's brother with pt also. Belongings taken.

## 2022-05-02 NOTE — ED Notes (Signed)
Phlebotomy called to assist with collection of pt blood work.

## 2022-05-02 NOTE — Progress Notes (Addendum)
Established Patient Office Visit  Subjective   Patient ID: Andrew Fowler, male    DOB: Oct 16, 1960  Age: 62 y.o. MRN: [id number]  Chief Complaint  Patient presents with   Meet new doctor.    Audible wheezing. Edema LLE's.    Medication Refill   Andrew Fowler presents today for acute issues he is also been reassigned and I am now his primary care physician.  He has had progressive weight gain and generally unwell feeling for the last several weeks.  He called me about 2 weeks ago and asked if he should increase his diuretic dose he did increase to 20 mg furosemide twice a day he was post to follow-up last week but that was changed to telehealth which was probably less helpful but he did come in this week.  Our scale shows that his weight has increased to 357 from 280 pounds in December.  He does have a history of steatohepatitis does not have a formal diagnosis of cirrhosis he last saw his liver specialist in 2022 and they were concerned for some degree of heart failure he has never been formally diagnosed that I can see with congestive heart failure.  Despite his increase in diuretic dosing he reports he has been peeing pretty minimally and is occasionally dark he brings in a power drink bottle with urine in it that is pretty clear that he says is from this morning.  He missed 2 appointments with his rheumatologist and now reports they will not refill his 5 mg prednisone he is asking for refill of this.  He has a history of rheumatoid arthritis as well as osteoarthritis.  His right shoulder is bothering him more today and he is requesting a steroid injection.    Objective:     BP (!) 102/53 (BP Location: Right Arm, Patient Position: Sitting, Cuff Size: Large)   Pulse 88   Temp (!) 97.4 F (36.3 C) (Oral)   Ht '5\' 11"'$  (1.803 m)   Wt (!) 357 lb 3.2 oz (162 kg)   SpO2 99% Comment: RA  BMI 49.82 kg/m  BP Readings from Last 3 Encounters:  05/02/22 (!) 102/53  03/05/22 133/69  07/09/21  106/69   Wt Readings from Last 3 Encounters:  05/02/22 (!) 357 lb 3.2 oz (162 kg)  03/05/22 280 lb 4.8 oz (127.1 kg)  07/25/20 260 lb (117.9 kg)      Physical Exam Constitutional:      Appearance: He is obese. He is ill-appearing.     Comments: Heavy breathing, in power wheelchair. Ill appearing  Eyes:     General: Scleral icterus present.  Cardiovascular:     Rate and Rhythm: Normal rate and regular rhythm.  Pulmonary:     Effort: Tachypnea present. No accessory muscle usage or respiratory distress.     Breath sounds: Wheezing (mild expiratory) present.  Abdominal:     General: There is distension.     Comments: Abdominal striae  Musculoskeletal:     Right lower leg: Edema present.     Left lower leg: Edema present.    POCUS: difficult exam patient in [place]. minimal if any ascites, distended bladder  Bladder scan: >961m   No results found for any visits on 05/02/22.  Last CBC Lab Results  Component Value Date   WBC 8.4 04/07/2019   HGB 15.9 04/07/2019   HCT 47.9 04/07/2019   MCV 93.4 04/07/2019   MCH 31.0 04/07/2019   RDW 14.5 04/07/2019   PLT 207 04/07/2019  Last metabolic panel Lab Results  Component Value Date   GLUCOSE 87 03/05/2022   NA 133 (L) 03/05/2022   K 3.9 03/05/2022   CL 97 03/05/2022   CO2 23 03/05/2022   BUN 10 03/05/2022   CREATININE 0.75 (L) 03/05/2022   EGFR 103 03/05/2022   CALCIUM 8.7 03/05/2022   PHOS 3.6 06/25/2010   PROT 6.6 03/05/2022   ALBUMIN 2.8 (L) 03/05/2022   LABGLOB 3.8 03/05/2022   AGRATIO 0.7 (L) 03/05/2022   BILITOT 1.4 (H) 03/05/2022   ALKPHOS 407 (H) 03/05/2022   AST 84 (H) 03/05/2022   ALT 50 (H) 03/05/2022   ANIONGAP 10 04/07/2019   Last lipids Lab Results  Component Value Date   CHOL 159 07/09/2021   HDL 62 07/09/2021   LDLCALC 73 07/09/2021   TRIG 137 07/09/2021   CHOLHDL 2.6 07/09/2021      The 10-year ASCVD risk score (Arnett DK, et al., 2019) is: 14.1%    Assessment & Plan:   Problem List  Items Addressed This Visit       Cardiovascular and Mediastinum   HTN (hypertension) (Chronic)    Blood pressure well-controlled      Pulmonary hypertension ([place])    O2 stats in reasonable range when at rest.  No current need for supplemental oxygen.        Respiratory   COPD (chronic obstructive pulmonary disease) (HCC) (Chronic)    He has some mild end expiratory wheezing as well as scattered rhonchi I think hypervolemia is the main driver here        Digestive   NASH (nonalcoholic steatohepatitis), ? some EtOH contribution and fibrosis  - Primary (Chronic)    Would recommend rechecking LFTs as well as platelet count and INR I do not see any obvious gross ascites on my brief POCUS examination.        Endocrine   Type 2 diabetes mellitus with other specified complication (HCC) (Chronic)    Weight is significantly up but I suspect this is all volume deferred A1c and further assessment of his diabetes due to acute illness and need for emergent evaluation in the emergency department.        Musculoskeletal and Integument   RA (rheumatoid arthritis) ([place]) (Chronic)    He has rescheduled his follow-up with [facility] rheumatology to later this month would be fine to continue a bridge of prednisone 5 however I will hold off prescribing this as I directed him to the emergency department.        Genitourinary   Urinary retention    Patient is volume overloaded and failure of diuretics this may been partially due to his acute urinary retention I have directed him up to the emergency department and spoke with the charge nurse.        Other   Obesity, Class III, BMI 40-49.9 (morbid obesity) (HCC)    Weight significantly up mostly due to volume overload       No follow-ups on file.    Lucious Groves, DO  PROCEDURE NOTE  PROCEDURE: right shoulder joint steroid injection.  PREOPERATIVE DIAGNOSIS: Bursitis of the right shoulder.  POSTOPERATIVE DIAGNOSIS: Bursitis of  the right shoulder.  PROCEDURE: The patient was apprised of the risks and the benefits of the procedure and informed consent was obtained, as witnessed by [facility]. Time-out procedure was performed, with confirmation of the patient's name, date of birth, and correct identification of the right shoulder to be injected. The patient's shoulder was  then marked at the appropriate site for injection placement. The shoulder was sterilely prepped with Betadine. A 40 mg (1 milliliter) solution of Kenalog was drawn up into a 3 mL syringe with a 2 mL of 1% lidocaine. The patient was injected with a 25 gauge needle at the lateral  aspect of his  right shoulder. There were no complications. The patient tolerated the procedure well. There was minimal bleeding. The patient was instructed to ice her shoulder upon leaving clinic and refrain from overuse over the next 3 days. The patient was instructed to go to the emergency room with any usual pain, swelling, or redness occurred in the injected area. The patient was given a followup appointment to evaluate response to the injection to his increased range of motion and reduction of pain.

## 2022-05-03 ENCOUNTER — Observation Stay (HOSPITAL_COMMUNITY): Payer: Medicaid Other

## 2022-05-03 ENCOUNTER — Inpatient Hospital Stay (HOSPITAL_COMMUNITY): Payer: Medicaid Other

## 2022-05-03 DIAGNOSIS — Y92009 Unspecified place in unspecified non-institutional (private) residence as the place of occurrence of the external cause: Secondary | ICD-10-CM | POA: Diagnosis not present

## 2022-05-03 DIAGNOSIS — E1165 Type 2 diabetes mellitus with hyperglycemia: Secondary | ICD-10-CM | POA: Diagnosis not present

## 2022-05-03 DIAGNOSIS — F17210 Nicotine dependence, cigarettes, uncomplicated: Secondary | ICD-10-CM | POA: Diagnosis not present

## 2022-05-03 DIAGNOSIS — K7290 Hepatic failure, unspecified without coma: Secondary | ICD-10-CM

## 2022-05-03 DIAGNOSIS — E785 Hyperlipidemia, unspecified: Secondary | ICD-10-CM | POA: Diagnosis not present

## 2022-05-03 DIAGNOSIS — R0602 Shortness of breath: Secondary | ICD-10-CM | POA: Diagnosis not present

## 2022-05-03 DIAGNOSIS — E1143 Type 2 diabetes mellitus with diabetic autonomic (poly)neuropathy: Secondary | ICD-10-CM | POA: Diagnosis not present

## 2022-05-03 DIAGNOSIS — K7460 Unspecified cirrhosis of liver: Secondary | ICD-10-CM

## 2022-05-03 DIAGNOSIS — G894 Chronic pain syndrome: Secondary | ICD-10-CM | POA: Diagnosis not present

## 2022-05-03 DIAGNOSIS — R1013 Epigastric pain: Secondary | ICD-10-CM | POA: Diagnosis not present

## 2022-05-03 DIAGNOSIS — I81 Portal vein thrombosis: Secondary | ICD-10-CM | POA: Diagnosis not present

## 2022-05-03 DIAGNOSIS — K2100 Gastro-esophageal reflux disease with esophagitis, without bleeding: Secondary | ICD-10-CM | POA: Diagnosis not present

## 2022-05-03 DIAGNOSIS — Z6841 Body Mass Index (BMI) 40.0 and over, adult: Secondary | ICD-10-CM | POA: Diagnosis not present

## 2022-05-03 DIAGNOSIS — E871 Hypo-osmolality and hyponatremia: Secondary | ICD-10-CM | POA: Diagnosis not present

## 2022-05-03 DIAGNOSIS — J90 Pleural effusion, not elsewhere classified: Secondary | ICD-10-CM | POA: Diagnosis not present

## 2022-05-03 DIAGNOSIS — E8770 Fluid overload, unspecified: Secondary | ICD-10-CM

## 2022-05-03 DIAGNOSIS — Z7952 Long term (current) use of systemic steroids: Secondary | ICD-10-CM

## 2022-05-03 DIAGNOSIS — Z1152 Encounter for screening for COVID-19: Secondary | ICD-10-CM | POA: Diagnosis not present

## 2022-05-03 DIAGNOSIS — J449 Chronic obstructive pulmonary disease, unspecified: Secondary | ICD-10-CM | POA: Diagnosis not present

## 2022-05-03 DIAGNOSIS — R188 Other ascites: Secondary | ICD-10-CM | POA: Diagnosis not present

## 2022-05-03 DIAGNOSIS — I700 Atherosclerosis of aorta: Secondary | ICD-10-CM | POA: Diagnosis not present

## 2022-05-03 DIAGNOSIS — F32A Depression, unspecified: Secondary | ICD-10-CM | POA: Diagnosis not present

## 2022-05-03 DIAGNOSIS — D696 Thrombocytopenia, unspecified: Secondary | ICD-10-CM | POA: Diagnosis not present

## 2022-05-03 DIAGNOSIS — E2740 Unspecified adrenocortical insufficiency: Secondary | ICD-10-CM | POA: Diagnosis not present

## 2022-05-03 DIAGNOSIS — I10 Essential (primary) hypertension: Secondary | ICD-10-CM | POA: Diagnosis not present

## 2022-05-03 DIAGNOSIS — M069 Rheumatoid arthritis, unspecified: Secondary | ICD-10-CM

## 2022-05-03 DIAGNOSIS — R601 Generalized edema: Secondary | ICD-10-CM | POA: Diagnosis not present

## 2022-05-03 DIAGNOSIS — E273 Drug-induced adrenocortical insufficiency: Secondary | ICD-10-CM | POA: Diagnosis not present

## 2022-05-03 DIAGNOSIS — I959 Hypotension, unspecified: Secondary | ICD-10-CM | POA: Diagnosis not present

## 2022-05-03 DIAGNOSIS — K920 Hematemesis: Secondary | ICD-10-CM | POA: Diagnosis not present

## 2022-05-03 DIAGNOSIS — K7581 Nonalcoholic steatohepatitis (NASH): Secondary | ICD-10-CM | POA: Diagnosis not present

## 2022-05-03 DIAGNOSIS — E1169 Type 2 diabetes mellitus with other specified complication: Secondary | ICD-10-CM | POA: Diagnosis not present

## 2022-05-03 DIAGNOSIS — E46 Unspecified protein-calorie malnutrition: Secondary | ICD-10-CM | POA: Diagnosis not present

## 2022-05-03 LAB — RESP PANEL BY RT-PCR (RSV, FLU A&B, COVID)  RVPGX2
Influenza A by PCR: NEGATIVE
Influenza B by PCR: NEGATIVE
Resp Syncytial Virus by PCR: NEGATIVE
SARS Coronavirus 2 by RT PCR: NEGATIVE

## 2022-05-03 LAB — ACTH STIMULATION, 3 TIME POINTS
Cortisol, 30 Min: 9.8 ug/dL
Cortisol, 60 Min: 11.4 ug/dL
Cortisol, Base: 2.6 ug/dL

## 2022-05-03 LAB — ECHOCARDIOGRAM COMPLETE
AR max vel: 4.09 cm2
AV Area VTI: 4.37 cm2
AV Area mean vel: 3.9 cm2
AV Mean grad: 3 mmHg
AV Peak grad: 5.5 mmHg
Ao pk vel: 1.17 m/s
Height: 71 in
S' Lateral: 3.5 cm
Weight: 5728.43 oz

## 2022-05-03 LAB — CBC
HCT: 36.9 % — ABNORMAL LOW (ref 39.0–52.0)
Hemoglobin: 12.9 g/dL — ABNORMAL LOW (ref 13.0–17.0)
MCH: 32 pg (ref 26.0–34.0)
MCHC: 35 g/dL (ref 30.0–36.0)
MCV: 91.6 fL (ref 80.0–100.0)
Platelets: 113 10*3/uL — ABNORMAL LOW (ref 150–400)
RBC: 4.03 MIL/uL — ABNORMAL LOW (ref 4.22–5.81)
RDW: 15.9 % — ABNORMAL HIGH (ref 11.5–15.5)
WBC: 9.6 10*3/uL (ref 4.0–10.5)
nRBC: 0 % (ref 0.0–0.2)

## 2022-05-03 LAB — COMPREHENSIVE METABOLIC PANEL
ALT: 54 U/L — ABNORMAL HIGH (ref 0–44)
AST: 114 U/L — ABNORMAL HIGH (ref 15–41)
Albumin: 1.7 g/dL — ABNORMAL LOW (ref 3.5–5.0)
Alkaline Phosphatase: 292 U/L — ABNORMAL HIGH (ref 38–126)
Anion gap: 6 (ref 5–15)
BUN: 15 mg/dL (ref 6–20)
CO2: 25 mmol/L (ref 22–32)
Calcium: 8 mg/dL — ABNORMAL LOW (ref 8.9–10.3)
Chloride: 92 mmol/L — ABNORMAL LOW (ref 98–111)
Creatinine, Ser: 1.02 mg/dL (ref 0.61–1.24)
GFR, Estimated: >60 mL/min (ref >60)
Glucose, Bld: 105 mg/dL — ABNORMAL HIGH (ref 70–99)
Potassium: 4.2 mmol/L (ref 3.5–5.1)
Sodium: 123 mmol/L — ABNORMAL LOW (ref 135–145)
Total Bilirubin: 4.2 mg/dL — ABNORMAL HIGH (ref 0.3–1.2)
Total Protein: 5.3 g/dL — ABNORMAL LOW (ref 6.5–8.1)

## 2022-05-03 LAB — HIV ANTIBODY (ROUTINE TESTING W REFLEX): HIV Screen 4th Generation wRfx: NONREACTIVE

## 2022-05-03 LAB — PROTIME-INR
INR: 1.4 — ABNORMAL HIGH (ref 0.8–1.2)
Prothrombin Time: 17.1 seconds — ABNORMAL HIGH (ref 11.4–15.2)

## 2022-05-03 MED ORDER — SPIRONOLACTONE 25 MG PO TABS
100.0000 mg | ORAL_TABLET | Freq: Every day | ORAL | Status: DC
  Administered 2022-05-03 – 2022-05-10 (×7): 100 mg via ORAL
  Filled 2022-05-03 (×8): qty 4

## 2022-05-03 MED ORDER — NICOTINE 14 MG/24HR TD PT24
14.0000 mg | MEDICATED_PATCH | Freq: Every day | TRANSDERMAL | Status: DC
  Administered 2022-05-03 – 2022-05-10 (×7): 14 mg via TRANSDERMAL
  Filled 2022-05-03 (×8): qty 1

## 2022-05-03 MED ORDER — CHLORHEXIDINE GLUCONATE CLOTH 2 % EX PADS
6.0000 | MEDICATED_PAD | Freq: Every day | CUTANEOUS | Status: DC
  Administered 2022-05-03 – 2022-05-10 (×8): 6 via TOPICAL

## 2022-05-03 MED ORDER — FUROSEMIDE 10 MG/ML IJ SOLN
40.0000 mg | Freq: Two times a day (BID) | INTRAMUSCULAR | Status: AC
  Administered 2022-05-03 (×2): 40 mg via INTRAVENOUS
  Filled 2022-05-03 (×2): qty 4

## 2022-05-03 NOTE — Progress Notes (Addendum)
Subjective:  Patient is resting comfortably in bed. Endorses improvement of dyspnea with nebulizers yesterday, although still feeling short of breath this morning.   No acute events overnight.   Objective:  Vital signs in last 24 hours: Vitals:   05/02/22 2052 05/02/22 2200 05/03/22 0023 05/03/22 0501  BP: (!) 113/57  (!) 122/59 (!) 101/54  Pulse: 99  (!) 105   Resp: 18  18 18  $ Temp: (!) 97.5 F (36.4 C)  (!) 97.5 F (36.4 C) 97.8 F (36.6 C)  TempSrc: Oral  Oral Oral  SpO2: 94% 97% 95% 95%  Weight:    (!) 162.4 kg  Height:       Weight change:   Intake/Output Summary (Last 24 hours) at 05/03/2022 1013 Last data filed at 05/03/2022 0700 Gross per 24 hour  Intake 100 ml  Output 1025 ml  Net -925 ml   Physical Exam General: Alert, in no acute distress.  HEENT: Facial telangiectasia, scleral icterus.  CV: RRR, no murmurs, rubs, or gallops.  Pulm: Wheezing on auscultation bilaterally.  Abd: Firm and distended, non-rigid. Tenderness of lateral aspects of abdomen bilaterally. Abdominal striae.  Skin: Warm and dry Ext: Sarcopenic in upper extremities.  Neuro: Alert and oriented x4. Normal cognition.  Psych: Pleasant, appropriate affect.      Latest Ref Rng & Units 05/03/2022    5:39 AM 05/02/2022    3:55 PM 05/02/2022    1:15 PM  CMP  Glucose 70 - 99 mg/dL 105   91   BUN 6 - 20 mg/dL 15   15   Creatinine 0.61 - 1.24 mg/dL 1.02   1.05   Sodium 135 - 145 mmol/L 123   124   Potassium 3.5 - 5.1 mmol/L 4.2   4.3   Chloride 98 - 111 mmol/L 92   92   CO2 22 - 32 mmol/L 25   25   Calcium 8.9 - 10.3 mg/dL 8.0   8.1   Total Protein 6.5 - 8.1 g/dL 5.3  6.3    Total Bilirubin 0.3 - 1.2 mg/dL 4.2  5.2    Alkaline Phos 38 - 126 U/L 292  330    AST 15 - 41 U/L 114  135    ALT 0 - 44 U/L 54  63     CBC    Component Value Date/Time   WBC 9.6 05/03/2022 0539   RBC 4.03 (L) 05/03/2022 0539   HGB 12.9 (L) 05/03/2022 0539   HGB 18.4 (H) 04/19/2016 1058   HCT 36.9 (L) 05/03/2022  0539   HCT 56.0 (H) 04/19/2016 1058   PLT 113 (L) 05/03/2022 0539   PLT 171 04/19/2016 1058   MCV 91.6 05/03/2022 0539   MCV 93 04/19/2016 1058   MCH 32.0 05/03/2022 0539   MCHC 35.0 05/03/2022 0539   RDW 15.9 (H) 05/03/2022 0539   RDW 14.2 04/19/2016 1058   LYMPHSABS 0.8 05/02/2022 1315   MONOABS 0.9 05/02/2022 1315   EOSABS 0.1 05/02/2022 1315   BASOSABS 0.1 05/02/2022 1315     Assessment/Plan:  Principal Problem:   Decompensated hepatic cirrhosis (HCC) Active Problems:   Fluid overload  Andrew Fowler is a 62 year old man with history significant for cirrhosis, COPD, OSA, Rheumatoid arthritis, T2DM who presents for 3 weeks of acutely worsening LE edema and increase in abdominal girth admitted for management of likely decompensated cirrhosis, is stable today on hospital day 1.   Decompensated Cirrhosis Patient endorses history of heavy alcohol  use many years ago and states he stopped drinking 12 years ago. Methotrexate use from 2014-2015 may also have contributed to fibrosis, although abnormal liver enzymes were noted starting in 2010 so unlikely to be main driver of cirrhosis. Liver disease probably related to obesity. Patient was started on furosemide in December 2024 for pitting edema. Patient's urine output was 1L overnight on furosemide. Overall patient is stable with no signs of encephalopathy on exam, he will need continued diuresis this hospitalization. Will also rule out bacteremia and SBP. If ultrasound shows large ascites, will consult IR for diagnostic paracentesis. -Start spironolactone 187m once daily -Furosemide 443mIV BID -Blood cultures -Abdomen U/S -F/u Echo -Low sodium diet with fluid restriction -Avoid hepatotoxic medications  Adrenal Insufficiency Chronic Steroid Use for Rheumatoid Arthritis Cosyntropin test positive for adrenal insufficiency.  -Restart prednisone 71m1maily  COPD Patient still endorses dyspnea that improves with nebulizers and  remains stable on room air. Not on supplemental oxygen at home. Wheezing was appreciated on exam but dyspnea is still more likely from fluid overload than COPD exacerbation. Viral respiratory panel negative. Will continue to monitor and treat symptoms, can consider increasing prednisone dose if symptoms worsen.  -BrGarlon Hatchetbulizer 171m471mID and Incruse Ellipta 1puff QD -Duoneb 3mL 66m  Urinary retention  -Continue foley today -Voiding trial once volume status improves  Chronic Pain syndrome Patient has history of chronic hip pain treated with Norco.  -Continue home Norco 10-3271mg 59mblet q6h PRN   Type 2 diabetes  CBG 105 this morning. Not on any home medications for diabetes.  -Continue to monitor  Full Code Brother, Andrew Fowler,Andrew BrowurrogAir traffic controller: Lovenox IVF: None Diet: Low sodium Family Updates:    Dispo: Admit patient to Inpatient with expected length of stay greater than 2 midnights.   LOS: 0 days   NelsonSpero Curbcal Student 05/03/2022, 10:13 AM

## 2022-05-03 NOTE — TOC Progression Note (Signed)
Transition of Care [facility]) - Progression Note    Patient Details  Name: Andrew Fowler MRN: [id number] Date of Birth: [birth year]  [facility] CM/SW Contact  Zenon Mayo, RN Phone Number: 05/03/2022, 10:04 AM  Clinical Narrative:    from home with brother , Decompensated hepatic cirrhosis , fluid overload.  TOC following.        Expected Discharge Plan and Services                                               Social Determinants of Health (SDOH) Interventions SDOH Screenings   Food Insecurity: Food Insecurity Present (05/02/2022)  Housing: Low Risk  (05/02/2022)  Transportation Needs: Unmet Transportation Needs (05/02/2022)  Utilities: Not At Risk (05/02/2022)  Alcohol Screen: Low Risk  (05/02/2022)  Depression (PHQ2-9): Low Risk  (05/02/2022)  Financial Resource Strain: Medium Risk (05/02/2022)  Physical Activity: Inactive (05/02/2022)  Social Connections: Socially Isolated (05/02/2022)  Stress: Stress Concern Present (05/02/2022)  Tobacco Use: High Risk (05/02/2022)    Readmission Risk Interventions     No data to display

## 2022-05-03 NOTE — Progress Notes (Signed)
  Echocardiogram 2D Echocardiogram has been performed.  Andrew Fowler 05/03/2022, 1:09 PM

## 2022-05-04 LAB — COMPREHENSIVE METABOLIC PANEL
ALT: 54 U/L — ABNORMAL HIGH (ref 0–44)
AST: 108 U/L — ABNORMAL HIGH (ref 15–41)
Albumin: 1.7 g/dL — ABNORMAL LOW (ref 3.5–5.0)
Alkaline Phosphatase: 281 U/L — ABNORMAL HIGH (ref 38–126)
Anion gap: 9 (ref 5–15)
BUN: 19 mg/dL (ref 6–20)
CO2: 24 mmol/L (ref 22–32)
Calcium: 7.8 mg/dL — ABNORMAL LOW (ref 8.9–10.3)
Chloride: 93 mmol/L — ABNORMAL LOW (ref 98–111)
Creatinine, Ser: 1.19 mg/dL (ref 0.61–1.24)
GFR, Estimated: >60 mL/min (ref >60)
Glucose, Bld: 129 mg/dL — ABNORMAL HIGH (ref 70–99)
Potassium: 3.7 mmol/L (ref 3.5–5.1)
Sodium: 126 mmol/L — ABNORMAL LOW (ref 135–145)
Total Bilirubin: 3.6 mg/dL — ABNORMAL HIGH (ref 0.3–1.2)
Total Protein: 5.4 g/dL — ABNORMAL LOW (ref 6.5–8.1)

## 2022-05-04 LAB — CBC
HCT: 35.1 % — ABNORMAL LOW (ref 39.0–52.0)
Hemoglobin: 12.8 g/dL — ABNORMAL LOW (ref 13.0–17.0)
MCH: 32.4 pg (ref 26.0–34.0)
MCHC: 36.5 g/dL — ABNORMAL HIGH (ref 30.0–36.0)
MCV: 88.9 fL (ref 80.0–100.0)
Platelets: 113 K/uL — ABNORMAL LOW (ref 150–400)
RBC: 3.95 MIL/uL — ABNORMAL LOW (ref 4.22–5.81)
RDW: 15.9 % — ABNORMAL HIGH (ref 11.5–15.5)
WBC: 10.1 K/uL (ref 4.0–10.5)
nRBC: 0 % (ref 0.0–0.2)

## 2022-05-04 LAB — MAGNESIUM: Magnesium: 2.1 mg/dL (ref 1.7–2.4)

## 2022-05-04 LAB — PROTIME-INR
INR: 1.5 — ABNORMAL HIGH (ref 0.8–1.2)
Prothrombin Time: 18.1 seconds — ABNORMAL HIGH (ref 11.4–15.2)

## 2022-05-04 MED ORDER — IPRATROPIUM-ALBUTEROL 0.5-2.5 (3) MG/3ML IN SOLN
3.0000 mL | RESPIRATORY_TRACT | Status: DC
  Administered 2022-05-04 – 2022-05-05 (×5): 3 mL via RESPIRATORY_TRACT
  Filled 2022-05-04 (×5): qty 3

## 2022-05-04 MED ORDER — ALPRAZOLAM 0.5 MG PO TABS: 0.5000 mg | ORAL_TABLET | Freq: Once | ORAL | Status: DC

## 2022-05-04 MED ORDER — ALPRAZOLAM 0.5 MG PO TABS
0.5000 mg | ORAL_TABLET | Freq: Two times a day (BID) | ORAL | Status: DC | PRN
  Administered 2022-05-06 – 2022-05-08 (×5): 0.5 mg via ORAL
  Filled 2022-05-04 (×6): qty 1

## 2022-05-04 MED ORDER — ACETIC ACID 2 % OT SOLN: 4.0000 [drp] | OTIC | Status: DC | PRN

## 2022-05-04 MED ORDER — PREDNISONE 5 MG PO TABS
5.0000 mg | ORAL_TABLET | Freq: Every day | ORAL | Status: DC
  Administered 2022-05-05 – 2022-05-10 (×5): 5 mg via ORAL
  Filled 2022-05-04 (×7): qty 1

## 2022-05-04 MED ORDER — MELATONIN 5 MG PO TABS
5.0000 mg | ORAL_TABLET | Freq: Every evening | ORAL | Status: DC | PRN
  Administered 2022-05-07 – 2022-05-08 (×2): 5 mg via ORAL
  Filled 2022-05-04 (×3): qty 1

## 2022-05-04 MED ORDER — CETAPHIL MOISTURIZING EX LOTN: TOPICAL_LOTION | CUTANEOUS | Status: DC | PRN

## 2022-05-04 MED ORDER — FUROSEMIDE 10 MG/ML IJ SOLN
40.0000 mg | Freq: Every day | INTRAMUSCULAR | Status: AC
  Administered 2022-05-04 – 2022-05-06 (×3): 40 mg via INTRAVENOUS
  Filled 2022-05-04 (×3): qty 4

## 2022-05-04 NOTE — Progress Notes (Signed)
Subjective:  Patient states he feels less swollen today on his right abdomen, although endorses pruritus. He denies increased sputum production or worsening cough.   Objective:  Vital signs in last 24 hours: Vitals:   05/03/22 2346 05/04/22 0429 05/04/22 0740 05/04/22 0850  BP: (!) 95/51 (!) 104/58 119/60   Pulse: 97 92 88   Resp: 20 19 19   $ Temp: 97.9 F (36.6 C) (!) 97.2 F (36.2 C)    TempSrc: Oral Oral Oral   SpO2: 93% 94% 93% 92%  Weight:  (!) 157.9 kg    Height:       Weight change: -1.8 kg  Intake/Output Summary (Last 24 hours) at 05/04/2022 1131 Last data filed at 05/04/2022 [id number] Gross per 24 hour  Intake 490 ml  Output 4625 ml  Net -4135 ml   Physical Exam General: Alert, in no acute distress HEENT: Scleral icterus, facial telangiectasias CV: Distant heart sounds, RRR no murmurs, rubs, or gallops appreciated Pulm: Wheezing on auscultation bilaterally Abd: Firm, distended, non-rigid. Non-tender to palpation.  Skin: Warm and dry Ext: 2+ pitting edema bilaterally in LE extremities Neuro: A&Ox4 Psych: Pleasant, appropriate affect      Latest Ref Rng & Units 05/04/2022   12:37 AM 05/03/2022    5:39 AM 05/02/2022    1:15 PM  BMP  Glucose 70 - 99 mg/dL 129  105  91   BUN 6 - 20 mg/dL 19  15  15   $ Creatinine 0.61 - 1.24 mg/dL 1.19  1.02  1.05   Sodium 135 - 145 mmol/L 126  123  124   Potassium 3.5 - 5.1 mmol/L 3.7  4.2  4.3   Chloride 98 - 111 mmol/L 93  92  92   CO2 22 - 32 mmol/L 24  25  25   $ Calcium 8.9 - 10.3 mg/dL 7.8  8.0  8.1     Assessment/Plan:  Principal Problem:   Decompensated hepatic cirrhosis (HCC) Active Problems:   Type 2 diabetes mellitus with other specified complication (HCC)   COPD (chronic obstructive pulmonary disease) (HCC)   Obstructive sleep apnea   RA (rheumatoid arthritis) (HCC)   Urinary retention   Fluid overload   Adrenal insufficiency (HCC)  Andrew Fowler is a 62 year old man with history significant for cirrhosis,  COPD, OSA, Rheumatoid arthritis, T2DM who presents for 3 weeks of acutely worsening LE edema and increase in abdominal girth admitted for management of fluid overload secondary to decompensated cirrhosis.   Decompensated Cirrhosis  Patient's urine output 4L overnight, net -3.5L. Kidney function shows mild rise in creatinine to 1.19, baseline is around 0.9. Will decrease lasix for slower diuresis and reevaluate tomorrow. Patient still has significant volume on exam. Abdominal ultrasound showed cirrhotic liver morphology without focal lesion and mild ascites, no indication for paracentesis at this time. Echo showed EF 60-65% with normal right and left ventricle function.  Will continue to monitor fluid status and diuretic regimen. -IV Furosemide 68m once daily -Spironolactone 1066mtablet daily -F/u blood cultures -Low sodium diet with fluid restriction -Avoid hepatotoxic medications  Adrenal Insufficiency Chronic Steroid Use for Rheumatoid Arthritis New adrenal insufficiency this admission secondary to chronic steroids. No signs of COPD exacerbation, will continue home dose.  -Prednisone 67m2mCOPD Patient endorses improvement with nebulizer/inhaler regimen, denies increased dyspnea or sputum production.  -Brovana nebulizer 167m72mID and Incruse Ellipta 1puff QD -Duoneb 3mL 62m PRN  Urinary retention  -Continue foley today (day 2) -Voiding trial once  volume status improves  Chronic Pain syndrome  Patient has history of chronic hip pain treated with Norco.  -Continue home Norco 10-350m 1 tablet q6h PRN  Anxiety Patient has been on stable dose of alprazolam since 2017 for anxiety, PDMPs have been appropriate.  -Restart home alprazolam 0.59mBID PRN  Type 2 diabetes  CBG 129 this morning. Not on any home medications for diabetes.  -Continue to monitor  Full Code Brother, Andrew Browis suAir traffic controller DVT: Lovenox IVF: None Diet: Low sodium Family Updates: Brother at  bedside this morning   Dispo: Admit patient to Inpatient with expected length of stay greater than 2 midnights.    LOS: 1 day   NeSpero CurbMedical Student 05/04/2022, 11:31 AM

## 2022-05-05 DIAGNOSIS — K7460 Unspecified cirrhosis of liver: Secondary | ICD-10-CM | POA: Diagnosis not present

## 2022-05-05 DIAGNOSIS — K7290 Hepatic failure, unspecified without coma: Secondary | ICD-10-CM | POA: Diagnosis not present

## 2022-05-05 DIAGNOSIS — M069 Rheumatoid arthritis, unspecified: Secondary | ICD-10-CM | POA: Diagnosis not present

## 2022-05-05 DIAGNOSIS — E2740 Unspecified adrenocortical insufficiency: Secondary | ICD-10-CM | POA: Diagnosis not present

## 2022-05-05 DIAGNOSIS — Z7952 Long term (current) use of systemic steroids: Secondary | ICD-10-CM | POA: Diagnosis not present

## 2022-05-05 LAB — CBC
HCT: 36.3 % — ABNORMAL LOW (ref 39.0–52.0)
Hemoglobin: 13.3 g/dL (ref 13.0–17.0)
MCH: 32.4 pg (ref 26.0–34.0)
MCHC: 36.6 g/dL — ABNORMAL HIGH (ref 30.0–36.0)
MCV: 88.3 fL (ref 80.0–100.0)
Platelets: 113 10*3/uL — ABNORMAL LOW (ref 150–400)
RBC: 4.11 MIL/uL — ABNORMAL LOW (ref 4.22–5.81)
RDW: 16.1 % — ABNORMAL HIGH (ref 11.5–15.5)
WBC: 11.6 10*3/uL — ABNORMAL HIGH (ref 4.0–10.5)
nRBC: 0 % (ref 0.0–0.2)

## 2022-05-05 LAB — COMPREHENSIVE METABOLIC PANEL
ALT: 62 U/L — ABNORMAL HIGH (ref 0–44)
AST: 132 U/L — ABNORMAL HIGH (ref 15–41)
Albumin: 1.7 g/dL — ABNORMAL LOW (ref 3.5–5.0)
Alkaline Phosphatase: 317 U/L — ABNORMAL HIGH (ref 38–126)
Anion gap: 8 (ref 5–15)
BUN: 19 mg/dL (ref 6–20)
CO2: 25 mmol/L (ref 22–32)
Calcium: 7.9 mg/dL — ABNORMAL LOW (ref 8.9–10.3)
Chloride: 95 mmol/L — ABNORMAL LOW (ref 98–111)
Creatinine, Ser: 1.01 mg/dL (ref 0.61–1.24)
GFR, Estimated: >60 mL/min (ref >60)
Glucose, Bld: 128 mg/dL — ABNORMAL HIGH (ref 70–99)
Potassium: 4.8 mmol/L (ref 3.5–5.1)
Sodium: 128 mmol/L — ABNORMAL LOW (ref 135–145)
Total Bilirubin: 4.2 mg/dL — ABNORMAL HIGH (ref 0.3–1.2)
Total Protein: 5.5 g/dL — ABNORMAL LOW (ref 6.5–8.1)

## 2022-05-05 LAB — PROTIME-INR
INR: 1.3 — ABNORMAL HIGH (ref 0.8–1.2)
Prothrombin Time: 16.5 seconds — ABNORMAL HIGH (ref 11.4–15.2)

## 2022-05-05 MED ORDER — POLYETHYLENE GLYCOL 3350 17 G PO PACK
17.0000 g | PACK | Freq: Every day | ORAL | Status: DC
  Administered 2022-05-05 – 2022-05-09 (×3): 17 g via ORAL
  Filled 2022-05-05 (×5): qty 1

## 2022-05-05 MED ORDER — POLYETHYLENE GLYCOL 3350 17 G PO PACK: 17.0000 g | PACK | Freq: Every day | ORAL | Status: DC | PRN

## 2022-05-05 MED ORDER — IPRATROPIUM-ALBUTEROL 0.5-2.5 (3) MG/3ML IN SOLN
3.0000 mL | Freq: Four times a day (QID) | RESPIRATORY_TRACT | Status: DC
  Administered 2022-05-05 – 2022-05-10 (×13): 3 mL via RESPIRATORY_TRACT
  Filled 2022-05-05 (×16): qty 3

## 2022-05-05 MED ORDER — SENNOSIDES-DOCUSATE SODIUM 8.6-50 MG PO TABS
1.0000 | ORAL_TABLET | Freq: Every day | ORAL | Status: DC
  Administered 2022-05-05: 1 via ORAL
  Filled 2022-05-05: qty 1

## 2022-05-05 NOTE — Progress Notes (Signed)
HD#3 SUBJECTIVE:  Patient Summary: Andrew Fowler is a 62 y.o. with a pertinent PMH of cirrhosis, COPD, OSA, rheumatoid arthritis, and T2DM, who presented with massive volume overload and admitted for decompensated hepatic cirrhosis.   Overnight Events: No acute events overnight  Interim History: The patient states that he feels the same today. Breathing treatments have not provided much benefit. He has not been able to walk much either, and this has been a problem since he had hip surgery.   OBJECTIVE:  Vital Signs: Vitals:   05/04/22 1951 05/04/22 2324 05/05/22 0348 05/05/22 0503  BP: 112/65   (!) 111/51  Pulse: 91   98  Resp: 18   20  Temp: 98.2 F (36.8 C)   98.2 F (36.8 C)  TempSrc: Oral   Oral  SpO2: 94% 94% 94% (!) 89%  Weight:    (!) 157.7 kg  Height:       Supplemental O2: Room Air SpO2: (!) 89 %  Filed Weights   05/03/22 0501 05/04/22 0429 05/05/22 0503  Weight: (!) 162.4 kg (!) 157.9 kg (!) 157.7 kg     Intake/Output Summary (Last 24 hours) at 05/05/2022 0558 Last data filed at 05/05/2022 0507 Gross per 24 hour  Intake 720 ml  Output 2950 ml  Net -2230 ml   Net IO Since Admission: -6,690 mL [05/05/22 0558]  Physical Exam: General: Awake and alert, in no acute distress. CV: Distant heart sounds, but RRR.  Pulmonary: Normal work of breathing with expiratory wheezing Extremities: 2+ pitting edema bilaterally Skin: Warm and dry.  Neuro: No focal deficit Psych: Normal mood and affect    ASSESSMENT/PLAN:  Assessment: Principal Problem:   Decompensated hepatic cirrhosis (HCC) Active Problems:   Type 2 diabetes mellitus with other specified complication (HCC)   COPD (chronic obstructive pulmonary disease) (HCC)   Obstructive sleep apnea   RA (rheumatoid arthritis) (HCC)   Urinary retention   Fluid overload   Adrenal insufficiency (HCC)   Plan: Decompensated cirrhosis Hyponatremia 2/2 above Patient had ~3 L UOP in the last 24 hrs with a net  -6.6 L this admission. He remains grossly volume overloaded on exam. Blood cultures with no growth to date. Na is low at 128, although this is improved from 123 on admission. Albumin is also low, at 1.7. Will space out lab work, as patient is very frustrated with the constant interruptions and has not been able to sleep.  - IV lasix 40 mg daily - Spironolactone 100 mg daily - Low sodium diet w/ fluid restriction  - PT/OT as able - Will not order daily labs, check intermittently  - Discontinued tele monitoring   Adrenal insufficiency Chronic steroid use in the setting of rheumatoid arhtirits ACTH stim test consistent with adrenal insufficiency. If BP drops, would consider stress dose steroids.  - Prednisone 5 mg daily  COPD No s/s of COPD exacerbation at this time. - Continue Brovana nebulizer BID  - Incurse Ellipita daily - DuoNeb q4h PRN  Constipation Patient has not had a BM since he has been admitted. - Scheduled senna + miralax daily  Urinary retnetion Day 3 of foley catheter. Will attempt a voiding trial this week when volume status is better  Chronic Pain syndrome  Patient has history of chronic hip pain treated with Norco.  - Continue home Norco 10-387m 1 tablet q6h PRN   Anxiety Patient has been on stable dose of alprazolam since 2017 for anxiety, PDMPs have been appropriate.  - Alprazolam  0.32m BID PRN    Type 2 diabetes  CBG 128 this morning. Not on any home medications for diabetes.  -Continue to monitor.   Best Practice: Diet: Regular diet IVF: Fluids: none VTE: Lovenox Code: Full AB: none Therapy Recs: Pending DISPO: Anticipated discharge pending  medical stability  .  Signature: RBuddy Duty D.O.  Internal Medicine Resident, PGY-2 [facility]es[facility] Medicine Residency  Pager: #[phone] AM, 05/05/2022   Please contact the on call pager after 5 pm and on weekends at [phone]

## 2022-05-05 NOTE — Progress Notes (Signed)
Patient initially ask for Xanax for bedtime. RN take his PRN meds on the pyxis. RN give an education about the timing of his breathing treatment which is ever 4 hours. Patient claimed that he doesn't want his Xanax anymore because according to him it doesn't make sense since I will be getting into his room and give his breathing treatment every 4 hours. RN asked the patient twice if patient still wants his PRN meds and he denied it. Will continue to monitor.

## 2022-05-06 ENCOUNTER — Telehealth: Payer: Self-pay

## 2022-05-06 ENCOUNTER — Other Ambulatory Visit (HOSPITAL_COMMUNITY): Payer: Self-pay

## 2022-05-06 DIAGNOSIS — E871 Hypo-osmolality and hyponatremia: Secondary | ICD-10-CM

## 2022-05-06 DIAGNOSIS — K7460 Unspecified cirrhosis of liver: Secondary | ICD-10-CM | POA: Diagnosis not present

## 2022-05-06 DIAGNOSIS — K7290 Hepatic failure, unspecified without coma: Secondary | ICD-10-CM | POA: Diagnosis not present

## 2022-05-06 LAB — CBC
HCT: 36.5 % — ABNORMAL LOW (ref 39.0–52.0)
Hemoglobin: 13.3 g/dL (ref 13.0–17.0)
MCH: 32.6 pg (ref 26.0–34.0)
MCHC: 36.4 g/dL — ABNORMAL HIGH (ref 30.0–36.0)
MCV: 89.5 fL (ref 80.0–100.0)
Platelets: 117 10*3/uL — ABNORMAL LOW (ref 150–400)
RBC: 4.08 MIL/uL — ABNORMAL LOW (ref 4.22–5.81)
RDW: 16.1 % — ABNORMAL HIGH (ref 11.5–15.5)
WBC: 12.7 10*3/uL — ABNORMAL HIGH (ref 4.0–10.5)
nRBC: 0 % (ref 0.0–0.2)

## 2022-05-06 LAB — COMPREHENSIVE METABOLIC PANEL
ALT: 74 U/L — ABNORMAL HIGH (ref 0–44)
AST: 144 U/L — ABNORMAL HIGH (ref 15–41)
Albumin: 1.7 g/dL — ABNORMAL LOW (ref 3.5–5.0)
Alkaline Phosphatase: 367 U/L — ABNORMAL HIGH (ref 38–126)
Anion gap: 7 (ref 5–15)
BUN: 19 mg/dL (ref 6–20)
CO2: 27 mmol/L (ref 22–32)
Calcium: 8 mg/dL — ABNORMAL LOW (ref 8.9–10.3)
Chloride: 95 mmol/L — ABNORMAL LOW (ref 98–111)
Creatinine, Ser: 1 mg/dL (ref 0.61–1.24)
GFR, Estimated: >60 mL/min (ref >60)
Glucose, Bld: 128 mg/dL — ABNORMAL HIGH (ref 70–99)
Potassium: 4.1 mmol/L (ref 3.5–5.1)
Sodium: 129 mmol/L — ABNORMAL LOW (ref 135–145)
Total Bilirubin: 4.3 mg/dL — ABNORMAL HIGH (ref 0.3–1.2)
Total Protein: 5.8 g/dL — ABNORMAL LOW (ref 6.5–8.1)

## 2022-05-06 LAB — PROTIME-INR
INR: 1.3 — ABNORMAL HIGH (ref 0.8–1.2)
Prothrombin Time: 16.3 seconds — ABNORMAL HIGH (ref 11.4–15.2)

## 2022-05-06 MED ORDER — SENNOSIDES-DOCUSATE SODIUM 8.6-50 MG PO TABS
2.0000 | ORAL_TABLET | Freq: Every day | ORAL | Status: DC
  Administered 2022-05-06 – 2022-05-08 (×3): 2 via ORAL
  Filled 2022-05-06 (×4): qty 2

## 2022-05-06 MED ORDER — POLYETHYLENE GLYCOL 3350 17 G PO PACK
17.0000 g | PACK | Freq: Once | ORAL | Status: AC
  Administered 2022-05-06: 17 g via ORAL
  Filled 2022-05-06: qty 1

## 2022-05-06 MED ORDER — ALUM & MAG HYDROXIDE-SIMETH 200-200-20 MG/5ML PO SUSP
30.0000 mL | Freq: Four times a day (QID) | ORAL | Status: DC | PRN
  Administered 2022-05-06 – 2022-05-07 (×2): 30 mL via ORAL
  Filled 2022-05-06 (×2): qty 30

## 2022-05-06 MED ORDER — ONDANSETRON HCL 4 MG/2ML IJ SOLN
4.0000 mg | Freq: Once | INTRAMUSCULAR | Status: AC
  Administered 2022-05-06: 4 mg via INTRAVENOUS
  Filled 2022-05-06: qty 2

## 2022-05-06 NOTE — Telephone Encounter (Signed)
A Prior Authorization was initiated for this patients HYDROCODONE/ACETAMINOPHEN through CoverMyMeds.   Key: [id number]

## 2022-05-06 NOTE — Progress Notes (Signed)
OT Cancellation Note  Patient Details Name: Andrew Fowler MRN: [id number] DOB: [birth year]   Cancelled Treatment:    Reason Eval/Treat Not Completed: Patient declined, no reason specified (pt stating "let's do this another day". Will follow up for OT evaluation as schedule permits.)  Renaye Rakers, OTD, OTR/L SecureChat Preferred Acute Rehab [phone]   Ulla Gallo 05/06/2022, 9:38 AM

## 2022-05-06 NOTE — Telephone Encounter (Signed)
PA decision: n/a  Previously approved.

## 2022-05-06 NOTE — Progress Notes (Signed)
Subjective:  Patient reports feeling okay this morning, still can feel the fluid in his abdomen and legs. Expressed understanding his cirrhosis diagnosis, the severity of which was communicated to him. Explained how cirrhosis can lead to fluid retention causing swelling. Discussed importance of sodium restriction. He is rightfully concerned about his liver. All of his questions were answered.    Objective: Vitals over previous 24hr: Vitals:   05/06/22 0430 05/06/22 0831 05/06/22 0832 05/06/22 0926  BP: 119/70   123/63  Pulse: 93     Resp: 20     Temp: 97.6 F (36.4 C)     TempSrc: Oral     SpO2: 94% 95% 95% 92%  Weight: (!) 158.6 kg     Height:        General:      awake and alert, lying comfortably in bed, cooperative, not in acute distress Lungs:      normal respiratory effort, breathing unlabored, symmetrical chest rise, mild expiratory wheezing, no crackles Cardiac:      regular rate and rhythm, distant S1 and S2, 2+ pitting edema up to thighs bilaterally Abdomen:      soft and non-distended, normoactive bowel sounds, 1+ flank edema Neurologic:      oriented to person-place-time, no gross focal deficits Psychiatric:      euthymic mood with congruent affect, intelligible speech    Assessment/Plan: Mr Gloss is a 62 year old male with a past medical history of morbid obesity, diabetes, COPD, and rheumatoid arthritis who presented with increased lower extremity swelling and breath shortness, now admitted for treatment of decompensated cirrhosis.    ---Decompensated cirrhosis ---Hyponatremia Patient presented with lower body swelling and breath shortness. Exam demonstrated marked fluid overload and stigmata of cirrhosis. His weight had increased from 127kg in 02-2022 to 162kg. Prior to admission, patient had hepatic steatosis and fibrosis identified via biopsy. Laboratory testing including Na, Alb, TBili, AST, ALT, and PT consistent with cirrhosis. Intravenous diuresis  with furosemide and spironolactone was initiated upon arrival. Most recent weight 157.9kg, net fluid output since admission has been 7.3L. > Furosemide 31m IV q24 > Spironolactone 10396mq24 > Diet low sodium > Monitor ins-outs > Trend CMP q24 > Trend CBC q24 > Trend PT-INR q48 > Physical-occupational therapy    ---Adrenal insufficiency secondary to chronic steroid use ---Rheumatoid arthritis Patient has history of rheumatoid arthritis managed at home with prednisone 96m26maily. Reports that he ran out of this medication a while ago. Upon arrival, patient exhibited signs of adrenal insufficiency including hypotension and hyponatremia. Cosyntropin stimulation test consistent with adrenal insufficiency. Prednisone at home dose was started on 2-11. > Prednisone 96mg76m4   ---Chronic obstructive pulmonary disease Patient presented with worsening dyspnea and productive cough, both of which explained by decompensated cirrhosis. His COPD is managed at home with tiotropium-olodaterol. Breathing has improved since starting diuresis and SpO2 values remain at or near target range of 88-92%. > Arformoterol 15ug 12 > Umeclidinium 62.5ug q24 > Ipratropium-albuterol 0.5-2.96mg 92m  ---Urinary retention Patient failed outpatient diuresis and was markedly volume overloaded upon arrival to the ED. He reported decreased urinary frequency and bladder ultrasound demonstrated >999mL.14mey catheter was placed, resulting in appropriate urine output. Currently on day four post Foley placement. > Continue Foley catheter > Consider voiding trial around 2-15   ---Chronic pain syndrome  Patient has history of chronic hip pain treated with hydrocodone-acetaminophen. Last bowel movement per electronic medical record 2-8. > Hydrocodone-acetaminophen 10-3296mg q44mN   ---Anxiety  Patient  has history of anxiety managed at home with alprazolam, which was first prescribed in 2017.  > Alprazolam 0.29m  q12   ---Diabetes II Patient has history of diabetes unmanaged by medications. Blood glucose has been below 200 since admission. Primary team will continue to monitor, especially given re-initiation of prednisone. > Trend CBG q24     Principal Problem:   Decompensated hepatic cirrhosis (HCC) Active Problems:   Type 2 diabetes mellitus with other specified complication (HCC)   COPD (chronic obstructive pulmonary disease) (HCC)   Obstructive sleep apnea   RA (rheumatoid arthritis) ([place]   Urinary retention   Fluid overload   Adrenal insufficiency ([place]    Prior to Admission Living Arrangement:  Anticipated Discharge Location:  Barriers to Discharge:  Dispo: Anticipated discharge in approximately 5-7 day(s).    DRoswell Nickel MD Internal Medicine PGY-1 Pager [phone] After 5pm on weekdays and 1pm on weekends: On Call pager [phone]

## 2022-05-06 NOTE — Progress Notes (Signed)
PT Cancellation Note  Patient Details Name: JEDI BARBOZA MRN: [id number] DOB: 22-Feb-1961   Cancelled Treatment:    Reason Eval/Treat Not Completed: Other (comment) (Pt refused adamantly.  Pt ended conversation by turning up his TV and would not look at PT and OT.  Will return and evaluate pt as able.)   Alvira Philips 05/06/2022, 9:40 AM Mckensi Redinger M,PT Acute Rehab Services [phone]

## 2022-05-07 DIAGNOSIS — G4733 Obstructive sleep apnea (adult) (pediatric): Secondary | ICD-10-CM | POA: Diagnosis not present

## 2022-05-07 DIAGNOSIS — R600 Localized edema: Secondary | ICD-10-CM

## 2022-05-07 DIAGNOSIS — K920 Hematemesis: Secondary | ICD-10-CM | POA: Diagnosis not present

## 2022-05-07 DIAGNOSIS — K7460 Unspecified cirrhosis of liver: Secondary | ICD-10-CM | POA: Diagnosis not present

## 2022-05-07 DIAGNOSIS — K7290 Hepatic failure, unspecified without coma: Secondary | ICD-10-CM | POA: Diagnosis not present

## 2022-05-07 DIAGNOSIS — K2100 Gastro-esophageal reflux disease with esophagitis, without bleeding: Secondary | ICD-10-CM | POA: Diagnosis not present

## 2022-05-07 DIAGNOSIS — J449 Chronic obstructive pulmonary disease, unspecified: Secondary | ICD-10-CM

## 2022-05-07 DIAGNOSIS — G894 Chronic pain syndrome: Secondary | ICD-10-CM

## 2022-05-07 DIAGNOSIS — R1013 Epigastric pain: Secondary | ICD-10-CM | POA: Diagnosis not present

## 2022-05-07 LAB — CBC
HCT: 39.8 % (ref 39.0–52.0)
Hemoglobin: 14.1 g/dL (ref 13.0–17.0)
MCH: 32.1 pg (ref 26.0–34.0)
MCHC: 35.4 g/dL (ref 30.0–36.0)
MCV: 90.7 fL (ref 80.0–100.0)
Platelets: 127 10*3/uL — ABNORMAL LOW (ref 150–400)
RBC: 4.39 MIL/uL (ref 4.22–5.81)
RDW: 16.1 % — ABNORMAL HIGH (ref 11.5–15.5)
WBC: 13 10*3/uL — ABNORMAL HIGH (ref 4.0–10.5)
nRBC: 0 % (ref 0.0–0.2)

## 2022-05-07 LAB — COMPREHENSIVE METABOLIC PANEL
ALT: 84 U/L — ABNORMAL HIGH (ref 0–44)
AST: 161 U/L — ABNORMAL HIGH (ref 15–41)
Albumin: 1.9 g/dL — ABNORMAL LOW (ref 3.5–5.0)
Alkaline Phosphatase: 360 U/L — ABNORMAL HIGH (ref 38–126)
Anion gap: 8 (ref 5–15)
BUN: 21 mg/dL — ABNORMAL HIGH (ref 6–20)
CO2: 29 mmol/L (ref 22–32)
Calcium: 8.4 mg/dL — ABNORMAL LOW (ref 8.9–10.3)
Chloride: 94 mmol/L — ABNORMAL LOW (ref 98–111)
Creatinine, Ser: 1.05 mg/dL (ref 0.61–1.24)
GFR, Estimated: >60 mL/min (ref >60)
Glucose, Bld: 119 mg/dL — ABNORMAL HIGH (ref 70–99)
Potassium: 4.1 mmol/L (ref 3.5–5.1)
Sodium: 131 mmol/L — ABNORMAL LOW (ref 135–145)
Total Bilirubin: 4.4 mg/dL — ABNORMAL HIGH (ref 0.3–1.2)
Total Protein: 6.2 g/dL — ABNORMAL LOW (ref 6.5–8.1)

## 2022-05-07 LAB — LIPASE, BLOOD: Lipase: 86 U/L — ABNORMAL HIGH (ref 11–51)

## 2022-05-07 MED ORDER — SODIUM CHLORIDE 0.9 % IV SOLN
1.0000 g | Freq: Once | INTRAVENOUS | Status: AC
  Administered 2022-05-07: 1 g via INTRAVENOUS
  Filled 2022-05-07: qty 10

## 2022-05-07 MED ORDER — SODIUM CHLORIDE 0.9 % IV SOLN: INTRAVENOUS | Status: DC

## 2022-05-07 MED ORDER — ORAL CARE MOUTH RINSE: 15.0000 mL | OROMUCOSAL | Status: DC | PRN

## 2022-05-07 MED ORDER — ONDANSETRON HCL 4 MG/2ML IJ SOLN
4.0000 mg | Freq: Four times a day (QID) | INTRAMUSCULAR | Status: DC | PRN
  Administered 2022-05-07 (×3): 4 mg via INTRAVENOUS
  Filled 2022-05-07 (×3): qty 2

## 2022-05-07 MED ORDER — ENOXAPARIN SODIUM 80 MG/0.8ML IJ SOSY
0.5000 mg/kg | PREFILLED_SYRINGE | INTRAMUSCULAR | Status: DC
  Administered 2022-05-09 – 2022-05-10 (×2): 80 mg via SUBCUTANEOUS
  Filled 2022-05-07 (×2): qty 0.8

## 2022-05-07 MED ORDER — MORPHINE SULFATE (PF) 2 MG/ML IV SOLN
2.0000 mg | INTRAVENOUS | Status: DC | PRN
  Administered 2022-05-07 (×3): 2 mg via INTRAVENOUS
  Filled 2022-05-07 (×3): qty 1

## 2022-05-07 MED ORDER — FUROSEMIDE 10 MG/ML IJ SOLN
40.0000 mg | Freq: Every day | INTRAMUSCULAR | Status: DC
  Administered 2022-05-07 – 2022-05-09 (×3): 40 mg via INTRAVENOUS
  Filled 2022-05-07 (×3): qty 4

## 2022-05-07 MED ORDER — MORPHINE SULFATE (PF) 2 MG/ML IV SOLN
2.0000 mg | Freq: Once | INTRAVENOUS | Status: AC
  Administered 2022-05-07: 2 mg via INTRAVENOUS
  Filled 2022-05-07: qty 1

## 2022-05-07 MED ORDER — PANTOPRAZOLE SODIUM 40 MG IV SOLR
40.0000 mg | Freq: Two times a day (BID) | INTRAVENOUS | Status: DC
  Administered 2022-05-07 – 2022-05-08 (×2): 40 mg via INTRAVENOUS
  Filled 2022-05-07 (×2): qty 10

## 2022-05-07 NOTE — Progress Notes (Signed)
Subjective:  Patient had 5 episodes of rust-colored emesis overnight, patient denies seeing bright red blood. He was given PRN zofran and maalox, states only feeling temporary relief. Patient continues to endorse epigastric abdominal pain this morning.   He endorses one bowel movement yesterday, did not know if it was normal/loose or non-bloody. He states he ate dinner last night with no issues until vomiting a few hours later.   Objective:  Vital signs in last 24 hours: Vitals:   05/06/22 2200 05/07/22 0002 05/07/22 0308 05/07/22 0600  BP:   126/70 119/66  Pulse:   97   Resp:      Temp:   98.1 F (36.7 C) 98 F (36.7 C)  TempSrc:   Oral Oral  SpO2: 90%     Weight:  (!) 155.1 kg    Height:       Weight change: -3.5 kg  Intake/Output Summary (Last 24 hours) at 05/07/2022 0714 Last data filed at 05/07/2022 0600 Gross per 24 hour  Intake 960 ml  Output 3400 ml  Net -2440 ml   Lipase     Component Value Date/Time   LIPASE 86 (H) 05/07/2022 0416      Latest Ref Rng & Units 05/07/2022    4:16 AM 05/06/2022    6:43 AM 05/05/2022   12:51 AM  CMP  Glucose 70 - 99 mg/dL 119  128  128   BUN 6 - 20 mg/dL 21  19  19   $ Creatinine 0.61 - 1.24 mg/dL 1.05  1.00  1.01   Sodium 135 - 145 mmol/L 131  129  128   Potassium 3.5 - 5.1 mmol/L 4.1  4.1  4.8   Chloride 98 - 111 mmol/L 94  95  95   CO2 22 - 32 mmol/L 29  27  25   $ Calcium 8.9 - 10.3 mg/dL 8.4  8.0  7.9   Total Protein 6.5 - 8.1 g/dL 6.2  5.8  5.5   Total Bilirubin 0.3 - 1.2 mg/dL 4.4  4.3  4.2   Alkaline Phos 38 - 126 U/L 360  367  317   AST 15 - 41 U/L 161  144  132   ALT 0 - 44 U/L 84  74  62     Physical Exam  General: Alert, appears uncomfortable. In no acute distress. Rust-colored emesis in bag on bed.  CV: Distant heart sounds. RRR, no murmurs, rubs, or gallops.  Pulm: Expiratory wheezing bilaterally on auscultation.  Abd: Firm and distended, non-rigid. Tenderness to palpation in epigastrium only.  Skin: Warm  and dry Ext: 3+ pitting edema in LE bilaterally Neuro: Alert and oriented Psych: Answers questions appropriately  Assessment/Plan:  Principal Problem:   Decompensated hepatic cirrhosis (HCC) Active Problems:   Type 2 diabetes mellitus with other specified complication (HCC)   COPD (chronic obstructive pulmonary disease) (HCC)   Obstructive sleep apnea   RA (rheumatoid arthritis) ([place])   Urinary retention   Fluid overload   Adrenal insufficiency (HCC)  EBONY SYVERTSON is a 62 year old man with history significant for cirrhosis, COPD, OSA, Rheumatoid arthritis, T2DM who was admitted for management of decompensated cirrhosis, with new rust-colored emesis and epigastric abdominal pain concerning for variceal bleed.   Decompensated cirrhosis New Epigastric Abdominal Pain  Patient's new abdominal pain and rust-colored emesis concerning for possible esophageal or gastric variceal bleed, although no large volume blood reported and hemoglobin has remained stable at 14.1. SBP also on the differential given  new abdominal pain, although abdominal ultrasound only showed mild ascites and patient has been afebrile. Low likelihood of gallstone pancreatitis given concurrent likely hematemesis. GI consulted and recommended EGD during admission, will provide pain control in the meantime with low threshold to obtain CT if symptoms worsen. Patient otherwise had -3.1L of output overnight, net -2.1L over last 24h and net -9.8L since admission, will continue to diurese today.  -Appreciate GI ongoing recommendations -EGD per GI, NPO after midnight and hold lovenox day of procedure -IV Furosemide 88m daily -Spironolactone 1071mdaily -Transfuse for Hgb <7 -IV morphine 88m14m4h PRN -Zofran 4mg54mh PRN -Maalox 30mL85m PRN -PT/OT -Low sodium diet  Adrenal insufficiency Chronic steroid use in the setting of rheumatoid arthritis Cosyntropin stimulation test consistent with adrenal insufficiency, restarted on  home dose of prednisone this admission.  -Prednisone 5mg  19mD  No signs or symptoms of COPD exacerbation.  - Brovana nebulizer BID  - Incurse Ellipita 1 puff daily - DuoNeb 3mL q640mUrinary retention Day 5 of foley catheter for urinary retention.  -Voiding trial this week   Chronic Pain syndrome  Patient has history of chronic hip pain treated with Norco.  - Continue home Norco 10-325mg 1 87met q6h PRN   Anxiety Patient has been on stable dose of alprazolam since 2017 for anxiety, PDMPs have been appropriate.  - Alprazolam 0.5mg BID 3m    Type 2 diabetes  CBG 119 this morning. Not on any home medications for diabetes. Restarted on home prednisone 5mg, [id number] to monitor CBG.   -Trend daily CBG    LOS: 4 days   Rontae Inglett, [employer]pero [employer] 05/07/2022, 7:14 AM

## 2022-05-07 NOTE — Consult Note (Addendum)
Consultation  Referring Provider:    Dr. Dareen Piano Primary Care Physician:  Lucious Groves, DO Primary Gastroenterologist: Dr. Carlean Purl       Reason for Consultation:     Decompensated cirrhosis with hematemesis         HPI:   Andrew Fowler is a 62 y.o. male with a past medical history of cirrhosis, COPD, OSA, rheumatoid arthritis, type 2 diabetes and multiple others listed below, who presented to the ED on 05/02/2022 for worsening of leg and abdominal swelling.  We are consulted today and advised to his decompensated cirrhosis and new hematemesis over the past 24 to 48 hours.    At time of presentation was noted that patient saw his outpatient physician and was sent to the ED for further evaluation.  Describes swelling of the legs over the past 3 weeks after a viral illness and the swelling had extended into his abdomen over the past week and a half.  His dose of Furosemide has been increased 2 weeks ago but he had decreased urination over the past 1 and half months.  Apparently gained 77 pounds from his last clinic visit in December to 05/02/2022.  Also describes some increased dyspnea with exertion and increased sputum production.  Associated symptoms included decreased appetite.  He was also off of his Prednisone which she took 5 mg for RA.    Today, it should be noted that patient is a terrible historian, he really does not want me in his room as he is trying to sleep.  Explains that he started vomiting yesterday evening and started seeing some dark maroonish type liquid, he has vomited 7 times since then. This was prior to going to sleep last night per him with the last episode about an hour ago.  Remains somewhat nauseous.  Tells me that in general he has some abdominal pain and points to one spot on his stomach, he feels somewhat more swollen than normal.  Not sure if he is really improving here yet per him.  No prior EGD.  Does not seem to recall following with Dr. Carlean Purl in the past.  Denies  heartburn or reflux.    Denies fever, chills.  Hospital course: Ultrasound of the abdomen right upper quadrant with liver Doppler 05/04/2022 with cirrhotic liver morphology without focal hepatic lesion, mild ascites, patent portal vein with appropriate directional flow, 05/03/2022 echo with LVEF 60-65%, hyponatremia sodium 120 at admission--> 131 today, AST 135, ALT 63, alk phos 330, total bili 5.2 at admission  GI history: 05/19/2019 CT of the liver abdomen with and without contrast showed morphologic features of liver compatible with cirrhosis, no suspicious enhancing liver lesion, gallstone, splenomegaly, borderline enlarged upper abdominal lymph nodes which were nonspecific in the setting of cirrhosis and aortic atherosclerosis 03/16/2019 follow-up for NASH with Dr. Carlean Purl- at that time rechecked LFTs, he has stopped alcohol and had remained elevated  Past Medical History:  Diagnosis Date   Abnormal laboratory test result 07/04/2011   Immunofixation electrophoresis 3/13 showed slightly restricted mobility in the IgG and kappa lanes and suggested repeat end of year.    Anxiety associated with depression 04/19/2011   On chronic benzos and SSRI's. Gets panic attacks. Does not see mental health.   Chronic pain syndrome 04/19/2011   Combination of RA, obesity, OA, & DDD. Has seen Dr Sharol Given & s/p R total hip arthroplasty 2/2 OA & ? AVN. s/p L4-L5 laminectomy. Lumbar MRI 4/11 : L3-L4 mod central canal narrowing.  Cervical MRI 6/11 : multi-level DDD and L foraminal stenosis at C4-5 and C5-6.    Chronic venous insufficiency 2013   ECHO 2013 was normal. LFT's, creatinine, TSH all normal. Alb a bit low.   COPD (chronic obstructive pulmonary disease) ([place])    Per pt, he has a diagnosis of COPD. No PFT's. Uses Alb MDI less than once a month.   Diabetes mellitus    Non-insulin dependent Type II.   Edema 04/19/2011   ECHO 2013 was normal. LFT's, creatinine, TSH all normal. Alb a bit low. Likely 2/2 chronic  venous insuff 2/2 weight.   Elevated liver function tests 05/17/2011   04/2010. Increased GGT (pt uses ETOH). AMA negative. ABD U/S limited but otherwise negative. Follow Alk phos level. AST & ALT also elevated. False + IgM Hep B Core Ab. Hep B viral load negative.   Same pattern during hospitalization 2010 : highest alkaline phosphatase was 355, AST 259, and ALT 871. ANA, rheumatoid factor, ceruloplasmin, CMV IgM, alpha antitrypsin, and AMA, all within normal limits.   Encounter for power mobility device assessment 03/14/2020   Erythrocytosis 07/04/2016   Gout    Pt has never had a crystal diagnosis.   Hepatitis B antibody positive 2013   IgM Hep B core Ab + but Hepatitis B viral load negative.    Hyperlipidemia    At goal of LDL <100 with statin.   Hypertension    ACEI monotherapy   Leukocytosis 01/11/2014   Since 2010. Smear 2015 mild left shift. Is on prednisone for RA but increase started before leukocytosis.    NASH (nonalcoholic steatohepatitis), ? some EtOH contribution and fibrosis  05/17/2011   Elevated since 05/2008 during hospitalization. Relatively stable. Extensive W/U and no etiology but likely fatty liver 2/2 obesity despite normal imaging.  Hepatitis - all negative. There was an initial + IgM Hep B core Ab but repeat testing was negative and Hep B viral load was negative.  TTG 06/15/2013  ANA x 2 (05/07/11 and 06/13/2008) ASMA x2 (07/18/2011 negative & weakly + at 21 on 05/27/2011 & nega   OA (osteoarthritis)    Obesity, Class III, BMI 40-49.9 (morbid obesity) ([place])    Obstructive sleep apnea 08/21/08   Sleep study AHI 16.4 with desat to 66%. CPAP of 17 decreased AHI to 0.9. Non-compliant with CPAP.   RA (rheumatoid arthritis) ([place]) 2013   Shortness of breath    Tobacco abuse    Per pt, he has a diagnosis of COPD. Need to locate PFT's.   Wheelchair bound     Past Surgical History:  Procedure Laterality Date   LAMINECTOMY  1995   L4-L5   TONSILLECTOMY     TOTAL HIP ARTHROPLASTY  Left 2009    Family History  Problem Relation Age of Onset   Diabetes Mother    Heart attack Mother    Heart disease Mother    Diabetes Father    Kidney disease Father    Obesity Brother     Social History   Tobacco Use   Smoking status: Every Day    Packs/day: 0.50    Years: 10.00    Total pack years: 5.00    Types: Cigarettes   Smokeless tobacco: Never   Tobacco comments:    17 cigarettes a day.  Substance Use Topics   Alcohol use: No    Comment: No alcohol x 59yr.   Drug use: No    Comment: Prior THC and crakc cocaine use.  10/12/13 7  years clean    Prior to Admission medications   Medication Sig Start Date End Date Taking? Authorizing Provider  Adalimumab (HUMIRA PEN) 40 MG/0.4ML PNKT Inject 40 mg into the skin every 14 (fourteen) days.   Yes [provider]  albuterol (VENTOLIN HFA) 108 (90 Base) MCG/ACT inhaler INHALE 1-2 PUFFS INTO THE LUNGS EVERY 4 (FOUR) HOURS AS NEEDED. SHORTNESS OF BREATH Patient taking differently: Inhale 2 puffs into the lungs every 4 (four) hours as needed for wheezing or shortness of breath. 12/31/21  Yes [name], MD  ALPRAZolam Duanne Moron) 0.5 MG tablet TAKE 1 TABLET BY MOUTH 2 TIMES DAILY AS NEEDED FOR ANXIETY. 04/12/22  Yes Angelica Pou, MD  furosemide (LASIX) 20 MG tablet Take 1 tablet (20 mg total) by mouth daily. 03/05/22 03/05/23 Yes Farrel Gordon, DO  HYDROcodone-acetaminophen (NORCO) 10-325 MG tablet Take 1 tablet by mouth every 6 (six) hours as needed. Patient taking differently: Take 1 tablet by mouth every 6 (six) hours as needed for moderate pain. 04/19/22  Yes Lucious Groves, DO  hydroxychloroquine (PLAQUENIL) 200 MG tablet Take 200 mg by mouth 2 (two) times daily. 10/17/20  Yes [provider]  predniSONE (DELTASONE) 20 MG tablet Take 2 tablets (40 mg total) by mouth daily with breakfast. 03/05/22  Yes Farrel Gordon, DO  predniSONE (DELTASONE) 5 MG tablet Take 1 tablet (5 mg total) by mouth daily. 12/26/20   Yes [name], MD  Tiotropium Bromide-Olodaterol (STIOLTO RESPIMAT) 2.5-2.5 MCG/ACT AERS INHALE 2 PUFFS BY MOUTH INTO THE LUNGS DAILY Patient taking differently: Inhale 2 each into the lungs daily. 04/01/22  Yes Lucious Groves, DO  ACCU-CHEK SOFTCLIX LANCETS lancets Check blood sugar once a day 12/25/17   Bartholomew Crews, MD  acetic acid 2 % otic solution Place 4 drops into both ears daily as needed. 03/05/22   Farrel Gordon, DO  atorvastatin (LIPITOR) 40 MG tablet TAKE 1 TABLET BY MOUTH EVERY DAY Patient taking differently: Take 40 mg by mouth daily. 08/28/21   [name], MD  Blood Glucose Monitoring Suppl (ACCU-CHEK AVIVA PLUS) w/Device KIT Check blood sugar once a day 04/19/16   Bartholomew Crews, MD  glucose blood (ACCU-CHEK AVIVA PLUS) test strip Check blood sugar once a day 12/25/17   Bartholomew Crews, MD  varenicline (CHANTIX) 0.5 MG tablet Take 1 tablet (0.5 mg total) by mouth daily for 3 days, THEN 1 tablet (0.5 mg total) 2 (two) times daily for 4 days, THEN 2 tablets (1 mg total) 2 (two) times daily. Patient not taking: Reported on 05/03/2022 03/05/22 05/28/22  Farrel Gordon, DO    Current Facility-Administered Medications  Medication Dose Route Frequency Provider Last Rate Last Admin   acetaminophen (TYLENOL) tablet 650 mg  650 mg Oral Q6H PRN Nani Gasser, MD       Or   acetaminophen (TYLENOL) suppository 650 mg  650 mg Rectal Q6H PRN Nani Gasser, MD       acetic acid 2 % OTIC (EAR) solution 4 drop  4 drop Both EARS Q3H PRN Sanjuan Dame, MD       ALPRAZolam Duanne Moron) tablet 0.5 mg  0.5 mg Oral BID PRN Sanjuan Dame, MD   0.5 mg at 05/07/22 0841   alum & mag hydroxide-simeth (MAALOX/MYLANTA) 200-200-20 MG/5ML suspension 30 mL  30 mL Oral Q6H PRN [name], MD   30 mL at 05/07/22 0320   arformoterol (BROVANA) nebulizer solution 15 mcg  15 mcg Nebulization BID Nani Gasser, MD   15  mcg at 05/06/22 2138   And   umeclidinium bromide (INCRUSE  ELLIPTA) 62.5 MCG/ACT 1 puff  1 puff Inhalation Daily Nani Gasser, MD   1 puff at 05/06/22 [id number]   cetaphil lotion   Topical PRN Sanjuan Dame, MD       Chlorhexidine Gluconate Cloth 2 % PADS 6 each  6 each Topical Daily Axel Filler, MD   6 each at 05/07/22 1015   enoxaparin (LOVENOX) injection 80 mg  0.5 mg/kg Subcutaneous Q24H Nani Gasser, MD   80 mg at 05/06/22 2128   HYDROcodone-acetaminophen ([place]) 10-325 MG per tablet 1 tablet  1 tablet Oral Q6H PRN Nani Gasser, MD   1 tablet at 05/07/22 0841   ipratropium-albuterol (DUONEB) 0.5-2.5 (3) MG/3ML nebulizer solution 3 mL  3 mL Nebulization Q6H Angelica Pou, MD   3 mL at 05/06/22 2129   melatonin tablet 5 mg  5 mg Oral QHS PRN Quintella Baton, MD   5 mg at 05/07/22 0220   nicotine (NICODERM CQ - dosed in mg/24 hours) patch 14 mg  14 mg Transdermal Daily Angelique Blonder, DO   14 mg at 05/06/22 0935   ondansetron (ZOFRAN) injection 4 mg  4 mg Intravenous Q6H PRN Atway, Rayann N, DO   4 mg at 05/07/22 [id number]   Oral care mouth rinse  15 mL Mouth Rinse PRN [name], MD       polyethylene glycol (MIRALAX / GLYCOLAX) packet 17 g  17 g Oral Daily Atway, Rayann N, DO   17 g at 05/06/22 0934   predniSONE (DELTASONE) tablet 5 mg  5 mg Oral Q breakfast Angelique Blonder, DO   5 mg at 05/06/22 0932   senna-docusate (Senokot-S) tablet 2 tablet  2 tablet Oral QHS Virl Axe, MD   2 tablet at 05/06/22 2128   spironolactone (ALDACTONE) tablet 100 mg  100 mg Oral Daily Nani Gasser, MD   100 mg at 05/06/22 0932    Allergies as of 05/02/2022   (No Known Allergies)     Review of Systems:   (limited as patient uncooperative) Constitutional: No weight loss, fever or chills Skin: No rash  Cardiovascular: No chest pain Respiratory: +SOB Gastrointestinal: See HPI and otherwise negative Genitourinary: No dysuria  Neurological: No headache, dizziness or syncope Musculoskeletal: No new muscle or joint  pain Hematologic: No bruising Psychiatric: No history of depression or anxiety    Physical Exam:  Vital signs in last 24 hours: Temp:  [97.3 F (36.3 C)-98.1 F (36.7 C)] 97.3 F (36.3 C) (02/13 0820) Pulse Rate:  [87-97] 93 (02/13 0820) Resp:  [20] 20 (02/13 0820) BP: (110-126)/(58-70) 111/61 (02/13 0820) SpO2:  [90 %-92 %] 91 % (02/13 0820) Weight:  [155.1 kg] 155.1 kg (02/13 0002) Last BM Date : 05/07/22 General: Uncooperative, morbidly obese, chronically ill appearing Caucasian male appears to be in mild distress, Well developed, alert  Head:  Normocephalic and atraumatic. Eyes:   PEERL, EOMI. No icterus. Conjunctiva pink. Ears:  Normal auditory acuity. Neck:  Supple Throat: Oral cavity and pharynx without inflammation, swelling or lesion. (White towel with maroon looking blood in small amt by mouth) Lungs: Respirations even and unlabored. Lungs clear to auscultation bilaterally.  +expiratory wheezing Heart: Normal S1, S2. No MRG. Regular rate and rhythm. +b/l pitting edema to thighs Abdomen:  Obese, Soft, nondistended,moderate generalized ttp. No rebound or guarding. Normal bowel sounds. No appreciable masses or hepatomegaly. Rectal:  Not performed.  Msk:  Symmetrical without gross deformities. Peripheral pulses  intact.  Extremities:  No visible deformity or joint abnormality.  Neurologic:  Alert and  oriented x4;  grossly normal neurologically.  Skin:   Dry and intact without significant lesions or rashes. Psychiatric: Demonstrates good judgement and reason without abnormal affect or behaviors.  LAB RESULTS: Recent Labs    05/05/22 0051 05/06/22 0643 05/07/22 0416  WBC 11.6* 12.7* 13.0*  HGB 13.3 13.3 14.1  HCT 36.3* 36.5* 39.8  PLT 113* 117* 127*   BMET Recent Labs    05/05/22 0051 05/06/22 0643 05/07/22 0416  NA 128* 129* 131*  K 4.8 4.1 4.1  CL 95* 95* 94*  CO2 25 27 29  $ GLUCOSE 128* 128* 119*  BUN 19 19 21*  CREATININE 1.01 1.00 1.05  CALCIUM 7.9*  8.0* 8.4*      Latest Ref Rng & Units 05/07/2022    4:16 AM 05/06/2022    6:43 AM 05/05/2022   12:51 AM  Hepatic Function  Total Protein 6.5 - 8.1 g/dL 6.2  5.8  5.5   Albumin 3.5 - 5.0 g/dL 1.9  1.7  1.7   AST 15 - 41 U/L 161  144  132   ALT 0 - 44 U/L 84  74  62   Alk Phosphatase 38 - 126 U/L 360  367  317   Total Bilirubin 0.3 - 1.2 mg/dL 4.4  4.3  4.2      PT/INR Recent Labs    05/05/22 0051 05/06/22 0643  LABPROT 16.5* 16.3*  INR 1.3* 1.3*     Impression / Plan:   Impression: 1.  Decompensated NASH cirrhosis: Initial presentation with lower extremity swelling and increased abdominal distention as well as dyspnea on exertion, exam showed fluid overload, weight increased almost 70 pounds over the past 2 months, patient started on Furosemide and Spironolactone, some success in diuresis, now with hematemesis as below 2.  Hematemesis: Started with episodes of hematemesis overnight, 7 so far, hgb remains stable; concern for varices vs gastritis/esophagitis 3.  Hyponatremia 4.  Adrenal insufficiency secondary to chronic steroid use for rheumatoid arthritis 5.  COPD 6.  Urinary retention 7.  Chronic pain syndrome 8.  Morbid obesity  Plan: 1.  Likely patient would benefit from an EGD for further evaluation. 2.  Continue to monitor hemoglobin and transfusion as needed less than 7 3.  Will need to hold Lovenox day of procedure 4.  Would continue Spironolactone 100 mg daily and Lasix 40 mg daily 5.  If patient is scheduled for EGD tomorrow will hold n.p.o. after midnight.  Thank you for your kind consultation, we will continue to follow.  Lavone Nian [facility]  05/07/2022, 11:27 AM   I have taken a history, reviewed the chart and examined the patient. I performed a substantive portion of this encounter, including complete performance of at least one of the key components, in conjunction with the APP. I agree with the APP's note, impression and recommendations  62 year old male  with morbid obesity and previously compensated NASH cirrhosis, COPD, OSA, DM and RA, admitted with worsening anasarca, found to have new hyperbilirubinemia/elevated liver enzymes and [place] with patent portal vein and only mild ascites.  He was being diuresed per the primary with 9-10 L negative over three days, but still with significant LE edema/anasarca on exam.  He reports having abdominal pain of unknown duration which became acutely worse today.  Last night he developed nausea and vomiting with several episodes of rust-colored emesis.  He has continued to vomit  throughout the day today and he has no appetite. He appears quite uncomfortable and says he can't drink anything at all or he will vomit.  He reports taking Advil infrequently for MSK pain, but denies any recent use.  Etiology of his abdominal pain not clear, but peptic ulcer disease, gallstones/choledocholithiasis, pancreatitis in the differential.  I recommend a CT scan to evaluate for ductal dilation, pancreatic inflammation, free air.  His ultrasound did not evaluate the gallbladder for stones, so this may need to be repeated if CT normal. We are planning for an EGD tomorrow to r/u PUD and screen for varices.  The etiology behind his elevated liver enzymes/hyperbilirubinemia is also unclear, presumable secondary to decompensated cirrhosis due to ongoing NASH (he denies alcohol use, no infections identified), but now with abdominal pain, this could be secondary to gallstones.  However the fact that his liver enzymes were elevated when he was admitted and he did not have abdominal pain may make this less likely.  Epigastric abdominal pain/questionable hematemesis - Recommend starting IV PPI BID - If overt bleeding noted, start octreotide and ceftriaxone - Maalox PRN - Pain control/antiemetics per primary team - CT abdomen w/out contrast (patient unable drink contrast) to eval for dilated bile ducts, gross inflammatory changes/free air,  reassess ascites - EGD scheduled for tomorrow; patient is high risk patient given his underlying lung disease, liver disease and supermorbid obesity  Decompensated cirrhosis - Continue current diuresis 40 mg lasix IV daily/aldactone 100 mg PO daily for now - Low threshold to initiate antibiotics although infectious work up negative thus far, given high association of infection as decompensating event   Legend Pecore E. Candis Schatz, MD [facility]

## 2022-05-07 NOTE — Progress Notes (Signed)
At the beginning of the shift, the patient complained of stomach discomfort, stating it "it feels like indigestion." RN administered PRN Maalox. Afterward, the patient reported difficulty having a bowel movement despite feeling the urge to do so. Several attempts using a bedpan were unsuccessful, and pt began to feel nauseous, leading to episodes of vomiting. MD was informed and order Zofran and Miralax. Pt successfully had bowel movement, nausea and vomiting persisted. Pt verbalized severe stomach pain, describing it as cramping pain in the mid-epigastric. Paged MD and came to the bedside. MD ordered labs and advice to give another dose of Maalox and Zofran PRN.

## 2022-05-07 NOTE — H&P (View-Only) (Signed)
Consultation  Referring Provider:    Dr. Dareen Piano Primary Care Physician:  Lucious Groves, DO Primary Gastroenterologist: Dr. Carlean Purl       Reason for Consultation:     Decompensated cirrhosis with hematemesis         HPI:   Andrew Fowler is a 62 y.o. male with a past medical history of cirrhosis, COPD, OSA, rheumatoid arthritis, type 2 diabetes and multiple others listed below, who presented to the ED on 05/02/2022 for worsening of leg and abdominal swelling.  We are consulted today and advised to his decompensated cirrhosis and new hematemesis over the past 24 to 48 hours.    At time of presentation was noted that patient saw his outpatient physician and was sent to the ED for further evaluation.  Describes swelling of the legs over the past 3 weeks after a viral illness and the swelling had extended into his abdomen over the past week and a half.  His dose of Furosemide has been increased 2 weeks ago but he had decreased urination over the past 1 and half months.  Apparently gained 77 pounds from his last clinic visit in December to 05/02/2022.  Also describes some increased dyspnea with exertion and increased sputum production.  Associated symptoms included decreased appetite.  He was also off of his Prednisone which she took 5 mg for RA.    Today, it should be noted that patient is a terrible historian, he really does not want me in his room as he is trying to sleep.  Explains that he started vomiting yesterday evening and started seeing some dark maroonish type liquid, he has vomited 7 times since then. This was prior to going to sleep last night per him with the last episode about an hour ago.  Remains somewhat nauseous.  Tells me that in general he has some abdominal pain and points to one spot on his stomach, he feels somewhat more swollen than normal.  Not sure if he is really improving here yet per him.  No prior EGD.  Does not seem to recall following with Dr. Carlean Purl in the past.  Denies  heartburn or reflux.    Denies fever, chills.  Hospital course: Ultrasound of the abdomen right upper quadrant with liver Doppler 05/04/2022 with cirrhotic liver morphology without focal hepatic lesion, mild ascites, patent portal vein with appropriate directional flow, 05/03/2022 echo with LVEF 60-65%, hyponatremia sodium 120 at admission--> 131 today, AST 135, ALT 63, alk phos 330, total bili 5.2 at admission  GI history: 05/19/2019 CT of the liver abdomen with and without contrast showed morphologic features of liver compatible with cirrhosis, no suspicious enhancing liver lesion, gallstone, splenomegaly, borderline enlarged upper abdominal lymph nodes which were nonspecific in the setting of cirrhosis and aortic atherosclerosis 03/16/2019 follow-up for NASH with Dr. Carlean Purl- at that time rechecked LFTs, he has stopped alcohol and had remained elevated  Past Medical History:  Diagnosis Date   Abnormal laboratory test result 07/04/2011   Immunofixation electrophoresis 3/13 showed slightly restricted mobility in the IgG and kappa lanes and suggested repeat end of year.    Anxiety associated with depression 04/19/2011   On chronic benzos and SSRI's. Gets panic attacks. Does not see mental health.   Chronic pain syndrome 04/19/2011   Combination of RA, obesity, OA, & DDD. Has seen Dr Sharol Given & s/p R total hip arthroplasty 2/2 OA & ? AVN. s/p L4-L5 laminectomy. Lumbar MRI 4/11 : L3-L4 mod central canal narrowing.  Cervical MRI 6/11 : multi-level DDD and L foraminal stenosis at C4-5 and C5-6.    Chronic venous insufficiency 2013   ECHO 2013 was normal. LFT's, creatinine, TSH all normal. Alb a bit low.   COPD (chronic obstructive pulmonary disease) ([place])    Per pt, he has a diagnosis of COPD. No PFT's. Uses Alb MDI less than once a month.   Diabetes mellitus    Non-insulin dependent Type II.   Edema 04/19/2011   ECHO 2013 was normal. LFT's, creatinine, TSH all normal. Alb a bit low. Likely 2/2 chronic  venous insuff 2/2 weight.   Elevated liver function tests 05/17/2011   04/2010. Increased GGT (pt uses ETOH). AMA negative. ABD U/S limited but otherwise negative. Follow Alk phos level. AST & ALT also elevated. False + IgM Hep B Core Ab. Hep B viral load negative.   Same pattern during hospitalization 2010 : highest alkaline phosphatase was 355, AST 259, and ALT 871. ANA, rheumatoid factor, ceruloplasmin, CMV IgM, alpha antitrypsin, and AMA, all within normal limits.   Encounter for power mobility device assessment 03/14/2020   Erythrocytosis 07/04/2016   Gout    Pt has never had a crystal diagnosis.   Hepatitis B antibody positive 2013   IgM Hep B core Ab + but Hepatitis B viral load negative.    Hyperlipidemia    At goal of LDL <100 with statin.   Hypertension    ACEI monotherapy   Leukocytosis 01/11/2014   Since 2010. Smear 2015 mild left shift. Is on prednisone for RA but increase started before leukocytosis.    NASH (nonalcoholic steatohepatitis), ? some EtOH contribution and fibrosis  05/17/2011   Elevated since 05/2008 during hospitalization. Relatively stable. Extensive W/U and no etiology but likely fatty liver 2/2 obesity despite normal imaging.  Hepatitis - all negative. There was an initial + IgM Hep B core Ab but repeat testing was negative and Hep B viral load was negative.  TTG 06/15/2013  ANA x 2 (05/07/11 and 06/13/2008) ASMA x2 (07/18/2011 negative & weakly + at 21 on 05/27/2011 & nega   OA (osteoarthritis)    Obesity, Class III, BMI 40-49.9 (morbid obesity) ([place])    Obstructive sleep apnea 08/21/08   Sleep study AHI 16.4 with desat to 66%. CPAP of 17 decreased AHI to 0.9. Non-compliant with CPAP.   RA (rheumatoid arthritis) ([place]) 2013   Shortness of breath    Tobacco abuse    Per pt, he has a diagnosis of COPD. Need to locate PFT's.   Wheelchair bound     Past Surgical History:  Procedure Laterality Date   LAMINECTOMY  1995   L4-L5   TONSILLECTOMY     TOTAL HIP ARTHROPLASTY  Left 2009    Family History  Problem Relation Age of Onset   Diabetes Mother    Heart attack Mother    Heart disease Mother    Diabetes Father    Kidney disease Father    Obesity Brother     Social History   Tobacco Use   Smoking status: Every Day    Packs/day: 0.50    Years: 10.00    Total pack years: 5.00    Types: Cigarettes   Smokeless tobacco: Never   Tobacco comments:    17 cigarettes a day.  Substance Use Topics   Alcohol use: No    Comment: No alcohol x 68yr.   Drug use: No    Comment: Prior THC and crakc cocaine use.  10/12/13 7  years clean    Prior to Admission medications   Medication Sig Start Date End Date Taking? Authorizing Provider  Adalimumab (HUMIRA PEN) 40 MG/0.4ML PNKT Inject 40 mg into the skin every 14 (fourteen) days.   Yes [provider]  albuterol (VENTOLIN HFA) 108 (90 Base) MCG/ACT inhaler INHALE 1-2 PUFFS INTO THE LUNGS EVERY 4 (FOUR) HOURS AS NEEDED. SHORTNESS OF BREATH Patient taking differently: Inhale 2 puffs into the lungs every 4 (four) hours as needed for wheezing or shortness of breath. 12/31/21  Yes [name], MD  ALPRAZolam Duanne Moron) 0.5 MG tablet TAKE 1 TABLET BY MOUTH 2 TIMES DAILY AS NEEDED FOR ANXIETY. 04/12/22  Yes Angelica Pou, MD  furosemide (LASIX) 20 MG tablet Take 1 tablet (20 mg total) by mouth daily. 03/05/22 03/05/23 Yes Farrel Gordon, DO  HYDROcodone-acetaminophen (NORCO) 10-325 MG tablet Take 1 tablet by mouth every 6 (six) hours as needed. Patient taking differently: Take 1 tablet by mouth every 6 (six) hours as needed for moderate pain. 04/19/22  Yes Lucious Groves, DO  hydroxychloroquine (PLAQUENIL) 200 MG tablet Take 200 mg by mouth 2 (two) times daily. 10/17/20  Yes [provider]  predniSONE (DELTASONE) 20 MG tablet Take 2 tablets (40 mg total) by mouth daily with breakfast. 03/05/22  Yes Farrel Gordon, DO  predniSONE (DELTASONE) 5 MG tablet Take 1 tablet (5 mg total) by mouth daily. 12/26/20   Yes [name], MD  Tiotropium Bromide-Olodaterol (STIOLTO RESPIMAT) 2.5-2.5 MCG/ACT AERS INHALE 2 PUFFS BY MOUTH INTO THE LUNGS DAILY Patient taking differently: Inhale 2 each into the lungs daily. 04/01/22  Yes Lucious Groves, DO  ACCU-CHEK SOFTCLIX LANCETS lancets Check blood sugar once a day 12/25/17   Bartholomew Crews, MD  acetic acid 2 % otic solution Place 4 drops into both ears daily as needed. 03/05/22   Farrel Gordon, DO  atorvastatin (LIPITOR) 40 MG tablet TAKE 1 TABLET BY MOUTH EVERY DAY Patient taking differently: Take 40 mg by mouth daily. 08/28/21   [name], MD  Blood Glucose Monitoring Suppl (ACCU-CHEK AVIVA PLUS) w/Device KIT Check blood sugar once a day 04/19/16   Bartholomew Crews, MD  glucose blood (ACCU-CHEK AVIVA PLUS) test strip Check blood sugar once a day 12/25/17   Bartholomew Crews, MD  varenicline (CHANTIX) 0.5 MG tablet Take 1 tablet (0.5 mg total) by mouth daily for 3 days, THEN 1 tablet (0.5 mg total) 2 (two) times daily for 4 days, THEN 2 tablets (1 mg total) 2 (two) times daily. Patient not taking: Reported on 05/03/2022 03/05/22 05/28/22  Farrel Gordon, DO    Current Facility-Administered Medications  Medication Dose Route Frequency Provider Last Rate Last Admin   acetaminophen (TYLENOL) tablet 650 mg  650 mg Oral Q6H PRN Nani Gasser, MD       Or   acetaminophen (TYLENOL) suppository 650 mg  650 mg Rectal Q6H PRN Nani Gasser, MD       acetic acid 2 % OTIC (EAR) solution 4 drop  4 drop Both EARS Q3H PRN Sanjuan Dame, MD       ALPRAZolam Duanne Moron) tablet 0.5 mg  0.5 mg Oral BID PRN Sanjuan Dame, MD   0.5 mg at 05/07/22 0841   alum & mag hydroxide-simeth (MAALOX/MYLANTA) 200-200-20 MG/5ML suspension 30 mL  30 mL Oral Q6H PRN [name], MD   30 mL at 05/07/22 0320   arformoterol (BROVANA) nebulizer solution 15 mcg  15 mcg Nebulization BID Nani Gasser, MD   15  mcg at 05/06/22 2138   And   umeclidinium bromide (INCRUSE  ELLIPTA) 62.5 MCG/ACT 1 puff  1 puff Inhalation Daily Nani Gasser, MD   1 puff at 05/06/22 [id number]   cetaphil lotion   Topical PRN Sanjuan Dame, MD       Chlorhexidine Gluconate Cloth 2 % PADS 6 each  6 each Topical Daily Axel Filler, MD   6 each at 05/07/22 1015   enoxaparin (LOVENOX) injection 80 mg  0.5 mg/kg Subcutaneous Q24H Nani Gasser, MD   80 mg at 05/06/22 2128   HYDROcodone-acetaminophen (Alice) 10-325 MG per tablet 1 tablet  1 tablet Oral Q6H PRN Nani Gasser, MD   1 tablet at 05/07/22 0841   ipratropium-albuterol (DUONEB) 0.5-2.5 (3) MG/3ML nebulizer solution 3 mL  3 mL Nebulization Q6H Angelica Pou, MD   3 mL at 05/06/22 2129   melatonin tablet 5 mg  5 mg Oral QHS PRN Quintella Baton, MD   5 mg at 05/07/22 0220   nicotine (NICODERM CQ - dosed in mg/24 hours) patch 14 mg  14 mg Transdermal Daily Angelique Blonder, DO   14 mg at 05/06/22 0935   ondansetron (ZOFRAN) injection 4 mg  4 mg Intravenous Q6H PRN Atway, Rayann N, DO   4 mg at 05/07/22 [id number]   Oral care mouth rinse  15 mL Mouth Rinse PRN [name], MD       polyethylene glycol (MIRALAX / GLYCOLAX) packet 17 g  17 g Oral Daily Atway, Rayann N, DO   17 g at 05/06/22 0934   predniSONE (DELTASONE) tablet 5 mg  5 mg Oral Q breakfast Angelique Blonder, DO   5 mg at 05/06/22 0932   senna-docusate (Senokot-S) tablet 2 tablet  2 tablet Oral QHS Virl Axe, MD   2 tablet at 05/06/22 2128   spironolactone (ALDACTONE) tablet 100 mg  100 mg Oral Daily Nani Gasser, MD   100 mg at 05/06/22 0932    Allergies as of 05/02/2022   (No Known Allergies)     Review of Systems:   (limited as patient uncooperative) Constitutional: No weight loss, fever or chills Skin: No rash  Cardiovascular: No chest pain Respiratory: +SOB Gastrointestinal: See HPI and otherwise negative Genitourinary: No dysuria  Neurological: No headache, dizziness or syncope Musculoskeletal: No new muscle or joint  pain Hematologic: No bruising Psychiatric: No history of depression or anxiety    Physical Exam:  Vital signs in last 24 hours: Temp:  [97.3 F (36.3 C)-98.1 F (36.7 C)] 97.3 F (36.3 C) (02/13 0820) Pulse Rate:  [87-97] 93 (02/13 0820) Resp:  [20] 20 (02/13 0820) BP: (110-126)/(58-70) 111/61 (02/13 0820) SpO2:  [90 %-92 %] 91 % (02/13 0820) Weight:  [155.1 kg] 155.1 kg (02/13 0002) Last BM Date : 05/07/22 General: Uncooperative, morbidly obese, chronically ill appearing Caucasian male appears to be in mild distress, Well developed, alert  Head:  Normocephalic and atraumatic. Eyes:   PEERL, EOMI. No icterus. Conjunctiva pink. Ears:  Normal auditory acuity. Neck:  Supple Throat: Oral cavity and pharynx without inflammation, swelling or lesion. (White towel with maroon looking blood in small amt by mouth) Lungs: Respirations even and unlabored. Lungs clear to auscultation bilaterally.  +expiratory wheezing Heart: Normal S1, S2. No MRG. Regular rate and rhythm. +b/l pitting edema to thighs Abdomen:  Obese, Soft, nondistended,moderate generalized ttp. No rebound or guarding. Normal bowel sounds. No appreciable masses or hepatomegaly. Rectal:  Not performed.  Msk:  Symmetrical without gross deformities. Peripheral pulses  intact.  Extremities:  No visible deformity or joint abnormality.  Neurologic:  Alert and  oriented x4;  grossly normal neurologically.  Skin:   Dry and intact without significant lesions or rashes. Psychiatric: Demonstrates good judgement and reason without abnormal affect or behaviors.  LAB RESULTS: Recent Labs    05/05/22 0051 05/06/22 0643 05/07/22 0416  WBC 11.6* 12.7* 13.0*  HGB 13.3 13.3 14.1  HCT 36.3* 36.5* 39.8  PLT 113* 117* 127*   BMET Recent Labs    05/05/22 0051 05/06/22 0643 05/07/22 0416  NA 128* 129* 131*  K 4.8 4.1 4.1  CL 95* 95* 94*  CO2 25 27 29  $ GLUCOSE 128* 128* 119*  BUN 19 19 21*  CREATININE 1.01 1.00 1.05  CALCIUM 7.9*  8.0* 8.4*      Latest Ref Rng & Units 05/07/2022    4:16 AM 05/06/2022    6:43 AM 05/05/2022   12:51 AM  Hepatic Function  Total Protein 6.5 - 8.1 g/dL 6.2  5.8  5.5   Albumin 3.5 - 5.0 g/dL 1.9  1.7  1.7   AST 15 - 41 U/L 161  144  132   ALT 0 - 44 U/L 84  74  62   Alk Phosphatase 38 - 126 U/L 360  367  317   Total Bilirubin 0.3 - 1.2 mg/dL 4.4  4.3  4.2      PT/INR Recent Labs    05/05/22 0051 05/06/22 0643  LABPROT 16.5* 16.3*  INR 1.3* 1.3*     Impression / Plan:   Impression: 1.  Decompensated NASH cirrhosis: Initial presentation with lower extremity swelling and increased abdominal distention as well as dyspnea on exertion, exam showed fluid overload, weight increased almost 70 pounds over the past 2 months, patient started on Furosemide and Spironolactone, some success in diuresis, now with hematemesis as below 2.  Hematemesis: Started with episodes of hematemesis overnight, 7 so far, hgb remains stable; concern for varices vs gastritis/esophagitis 3.  Hyponatremia 4.  Adrenal insufficiency secondary to chronic steroid use for rheumatoid arthritis 5.  COPD 6.  Urinary retention 7.  Chronic pain syndrome 8.  Morbid obesity  Plan: 1.  Likely patient would benefit from an EGD for further evaluation. 2.  Continue to monitor hemoglobin and transfusion as needed less than 7 3.  Will need to hold Lovenox day of procedure 4.  Would continue Spironolactone 100 mg daily and Lasix 40 mg daily 5.  If patient is scheduled for EGD tomorrow will hold n.p.o. after midnight.  Thank you for your kind consultation, we will continue to follow.  Lavone Nian [facility]  05/07/2022, 11:27 AM   I have taken a history, reviewed the chart and examined the patient. I performed a substantive portion of this encounter, including complete performance of at least one of the key components, in conjunction with the APP. I agree with the APP's note, impression and recommendations  62 year old male  with morbid obesity and previously compensated NASH cirrhosis, COPD, OSA, DM and RA, admitted with worsening anasarca, found to have new hyperbilirubinemia/elevated liver enzymes and [place] with patent portal vein and only mild ascites.  He was being diuresed per the primary with 9-10 L negative over three days, but still with significant LE edema/anasarca on exam.  He reports having abdominal pain of unknown duration which became acutely worse today.  Last night he developed nausea and vomiting with several episodes of rust-colored emesis.  He has continued to vomit  throughout the day today and he has no appetite. He appears quite uncomfortable and says he can't drink anything at all or he will vomit.  He reports taking Advil infrequently for MSK pain, but denies any recent use.  Etiology of his abdominal pain not clear, but peptic ulcer disease, gallstones/choledocholithiasis, pancreatitis in the differential.  I recommend a CT scan to evaluate for ductal dilation, pancreatic inflammation, free air.  His ultrasound did not evaluate the gallbladder for stones, so this may need to be repeated if CT normal. We are planning for an EGD tomorrow to r/u PUD and screen for varices.  The etiology behind his elevated liver enzymes/hyperbilirubinemia is also unclear, presumable secondary to decompensated cirrhosis due to ongoing NASH (he denies alcohol use, no infections identified), but now with abdominal pain, this could be secondary to gallstones.  However the fact that his liver enzymes were elevated when he was admitted and he did not have abdominal pain may make this less likely.  Epigastric abdominal pain/questionable hematemesis - Recommend starting IV PPI BID - If overt bleeding noted, start octreotide and ceftriaxone - Maalox PRN - Pain control/antiemetics per primary team - CT abdomen w/out contrast (patient unable drink contrast) to eval for dilated bile ducts, gross inflammatory changes/free air,  reassess ascites - EGD scheduled for tomorrow; patient is high risk patient given his underlying lung disease, liver disease and supermorbid obesity  Decompensated cirrhosis - Continue current diuresis 40 mg lasix IV daily/aldactone 100 mg PO daily for now - Low threshold to initiate antibiotics although infectious work up negative thus far, given high association of infection as decompensating event   Weda Baumgarner E. Candis Schatz, MD [facility]

## 2022-05-07 NOTE — Progress Notes (Signed)
OT Cancellation Note  Patient Details Name: Andrew Fowler MRN: [id number] DOB: 07-Apr-1960   Cancelled Treatment:    Reason Eval/Treat Not Completed: Pain limiting ability to participate. Pt declined evaluation due to pain despite encouragement and having pain meds. OT will try again next available time  Britt Bottom 05/07/2022, 12:18 PM

## 2022-05-07 NOTE — Progress Notes (Signed)
PT Cancellation Note  Patient Details Name: Andrew Fowler MRN: [id number] DOB: 11/27/1960   Cancelled Treatment:    Reason Eval/Treat Not Completed: Patient declined, no reason specified;Pain limiting ability to participate Declined PT evaluation due to pain despite encouragement and having pain meds. Will follow.   Marguarite Arbour A Malai Lady 05/07/2022, 11:26 AM Marisa Severin, PT, DPT Acute Rehabilitation Services Secure chat preferred Office [phone]

## 2022-05-08 ENCOUNTER — Inpatient Hospital Stay (HOSPITAL_COMMUNITY): Payer: Medicaid Other

## 2022-05-08 ENCOUNTER — Inpatient Hospital Stay (HOSPITAL_COMMUNITY): Payer: Medicaid Other | Admitting: Anesthesiology

## 2022-05-08 ENCOUNTER — Encounter (HOSPITAL_COMMUNITY): Payer: Self-pay | Admitting: Student in an Organized Health Care Education/Training Program

## 2022-05-08 ENCOUNTER — Encounter (HOSPITAL_COMMUNITY)
Admission: EM | Disposition: A | Discharge status: Home / Self Care | Payer: Self-pay | Source: Home / Self Care | Attending: Internal Medicine

## 2022-05-08 DIAGNOSIS — K7460 Unspecified cirrhosis of liver: Secondary | ICD-10-CM | POA: Diagnosis not present

## 2022-05-08 DIAGNOSIS — J449 Chronic obstructive pulmonary disease, unspecified: Secondary | ICD-10-CM

## 2022-05-08 DIAGNOSIS — R188 Other ascites: Secondary | ICD-10-CM | POA: Diagnosis not present

## 2022-05-08 DIAGNOSIS — G4733 Obstructive sleep apnea (adult) (pediatric): Secondary | ICD-10-CM | POA: Diagnosis not present

## 2022-05-08 DIAGNOSIS — F17210 Nicotine dependence, cigarettes, uncomplicated: Secondary | ICD-10-CM | POA: Diagnosis not present

## 2022-05-08 DIAGNOSIS — K2100 Gastro-esophageal reflux disease with esophagitis, without bleeding: Secondary | ICD-10-CM | POA: Diagnosis not present

## 2022-05-08 DIAGNOSIS — R601 Generalized edema: Secondary | ICD-10-CM

## 2022-05-08 DIAGNOSIS — R600 Localized edema: Secondary | ICD-10-CM | POA: Diagnosis not present

## 2022-05-08 DIAGNOSIS — I10 Essential (primary) hypertension: Secondary | ICD-10-CM

## 2022-05-08 DIAGNOSIS — R109 Unspecified abdominal pain: Secondary | ICD-10-CM | POA: Diagnosis not present

## 2022-05-08 DIAGNOSIS — K8020 Calculus of gallbladder without cholecystitis without obstruction: Secondary | ICD-10-CM | POA: Diagnosis not present

## 2022-05-08 DIAGNOSIS — K4020 Bilateral inguinal hernia, without obstruction or gangrene, not specified as recurrent: Secondary | ICD-10-CM | POA: Diagnosis not present

## 2022-05-08 DIAGNOSIS — I700 Atherosclerosis of aorta: Secondary | ICD-10-CM | POA: Diagnosis not present

## 2022-05-08 DIAGNOSIS — R1013 Epigastric pain: Secondary | ICD-10-CM

## 2022-05-08 DIAGNOSIS — K7290 Hepatic failure, unspecified without coma: Secondary | ICD-10-CM | POA: Diagnosis not present

## 2022-05-08 DIAGNOSIS — F17200 Nicotine dependence, unspecified, uncomplicated: Secondary | ICD-10-CM | POA: Diagnosis not present

## 2022-05-08 DIAGNOSIS — K920 Hematemesis: Secondary | ICD-10-CM

## 2022-05-08 DIAGNOSIS — G894 Chronic pain syndrome: Secondary | ICD-10-CM | POA: Diagnosis not present

## 2022-05-08 HISTORY — PX: ESOPHAGOGASTRODUODENOSCOPY (EGD) WITH PROPOFOL: SHX5813

## 2022-05-08 LAB — PROTIME-INR
INR: 1.3 — ABNORMAL HIGH (ref 0.8–1.2)
Prothrombin Time: 16.2 seconds — ABNORMAL HIGH (ref 11.4–15.2)

## 2022-05-08 LAB — CBC
HCT: 37.7 % — ABNORMAL LOW (ref 39.0–52.0)
Hemoglobin: 13.8 g/dL (ref 13.0–17.0)
MCH: 33.1 pg (ref 26.0–34.0)
MCHC: 36.6 g/dL — ABNORMAL HIGH (ref 30.0–36.0)
MCV: 90.4 fL (ref 80.0–100.0)
Platelets: 116 10*3/uL — ABNORMAL LOW (ref 150–400)
RBC: 4.17 MIL/uL — ABNORMAL LOW (ref 4.22–5.81)
RDW: 16.9 % — ABNORMAL HIGH (ref 11.5–15.5)
WBC: 13 10*3/uL — ABNORMAL HIGH (ref 4.0–10.5)
nRBC: 0 % (ref 0.0–0.2)

## 2022-05-08 LAB — COMPREHENSIVE METABOLIC PANEL
ALT: 73 U/L — ABNORMAL HIGH (ref 0–44)
AST: 119 U/L — ABNORMAL HIGH (ref 15–41)
Albumin: 1.8 g/dL — ABNORMAL LOW (ref 3.5–5.0)
Alkaline Phosphatase: 307 U/L — ABNORMAL HIGH (ref 38–126)
Anion gap: 9 (ref 5–15)
BUN: 27 mg/dL — ABNORMAL HIGH (ref 8–23)
CO2: 29 mmol/L (ref 22–32)
Calcium: 8.2 mg/dL — ABNORMAL LOW (ref 8.9–10.3)
Chloride: 93 mmol/L — ABNORMAL LOW (ref 98–111)
Creatinine, Ser: 1 mg/dL (ref 0.61–1.24)
GFR, Estimated: >60 mL/min (ref >60)
Glucose, Bld: 112 mg/dL — ABNORMAL HIGH (ref 70–99)
Potassium: 4 mmol/L (ref 3.5–5.1)
Sodium: 131 mmol/L — ABNORMAL LOW (ref 135–145)
Total Bilirubin: 4.2 mg/dL — ABNORMAL HIGH (ref 0.3–1.2)
Total Protein: 5.7 g/dL — ABNORMAL LOW (ref 6.5–8.1)

## 2022-05-08 LAB — CULTURE, BLOOD (ROUTINE X 2)
Culture: NO GROWTH
Culture: NO GROWTH
Special Requests: ADEQUATE
Special Requests: ADEQUATE

## 2022-05-08 SURGERY — ESOPHAGOGASTRODUODENOSCOPY (EGD) WITH PROPOFOL
Anesthesia: Monitor Anesthesia Care

## 2022-05-08 MED ORDER — LIDOCAINE 2% (20 MG/ML) 5 ML SYRINGE
INTRAMUSCULAR | Status: DC | PRN
  Administered 2022-05-08: 100 mg via INTRAVENOUS

## 2022-05-08 MED ORDER — PROPOFOL 10 MG/ML IV BOLUS
INTRAVENOUS | Status: DC | PRN
  Administered 2022-05-08: 50 mg via INTRAVENOUS
  Administered 2022-05-08: 10 mg via INTRAVENOUS
  Administered 2022-05-08: 40 mg via INTRAVENOUS

## 2022-05-08 MED ORDER — PROPOFOL 500 MG/50ML IV EMUL
INTRAVENOUS | Status: DC | PRN
  Administered 2022-05-08: 100 ug/kg/min via INTRAVENOUS

## 2022-05-08 MED ORDER — LACTATED RINGERS IV SOLN
INTRAVENOUS | Status: AC | PRN
  Administered 2022-05-08: 10 mL/h via INTRAVENOUS

## 2022-05-08 MED ORDER — PANTOPRAZOLE SODIUM 40 MG PO TBEC
40.0000 mg | DELAYED_RELEASE_TABLET | Freq: Every day | ORAL | Status: DC
  Administered 2022-05-08 – 2022-05-10 (×3): 40 mg via ORAL
  Filled 2022-05-08 (×3): qty 1

## 2022-05-08 SURGICAL SUPPLY — 15 items

## 2022-05-08 NOTE — Progress Notes (Signed)
Physical Therapy Evaluation Patient Details Name: Andrew Fowler MRN: [id number] DOB: March 04, 1961 Today's Date: 05/08/2022  History of Present Illness  ARISTIDE SHEERIN is a 62 y.o. male with a past medical history of cirrhosis, COPD, OSA, rheumatoid arthritis, type 2 diabetes and multiple others listed below, who presented to the ED on 05/02/2022 for worsening of leg and abdominal swelling, admitted w/ dx: decompensated hepatic cirrhosis  Clinical Impression  PTA pt living with brother in single story home with ramped entrance. Pt reports using his "Terrilee Files" for transferring to/from bed and BSC, also uses in community to do grocery shopping in his neighborhood. Pt is currently limited in safe mobility, by increased edema which limits his ROM and mobility LE>UE due to increased weight, in presence of generalized weakness, and decreased endurance. Evaluation limited to bed level due to pt being NPO and reporting that movement would make him too thirsty. Agreeable to therapy when he is able to quench his thirst with activity. PT recommending HHPT at discharge pending further mobility evaluation. PT will follow back tomorrow to progress mobility.      Recommendations for follow up therapy are one component of a multi-disciplinary discharge planning process, led by the attending physician.  Recommendations may be updated based on patient status, additional functional criteria and insurance authorization.  Follow Up Recommendations Home health PT      Assistance Recommended at Discharge Frequent or constant Supervision/Assistance  Patient can return home with the following  A lot of help with walking and/or transfers;A lot of help with bathing/dressing/bathroom;Assistance with cooking/housework;Assist for transportation;Help with stairs or ramp for entrance    Equipment Recommendations None recommended by PT     Functional Status Assessment Patient has had a recent decline in their functional status and  demonstrates the ability to make significant improvements in function in a reasonable and predictable amount of time.     Precautions / Restrictions Precautions Precautions: Fall Precaution Comments: Pt uses scooter at home and performs transfers from bed/chair to scooter Restrictions Weight Bearing Restrictions: No      Mobility  Bed Mobility Overal bed mobility:  (Pt seen for limited bedside eval this date)             General bed mobility comments: pt refuses bed mobility because he is not currently allowed to drink, and moving would make him thirsty.    Transfers                   General transfer comment: Pt uses scooter and SPT (to 3:1, bed, chair at baseline level). Pt declined all OOB activity including sitting up at EOB this date stating that he is NPO and will require a drink to participate more. Discussed role of OT/PT with pt and he is agreeable to OOB transfers when NPO is discontinued per his report today.        Balance Overall balance assessment:  (TBA - limited bedside eval this date)                                           Pertinent Vitals/Pain  Denies pain     Home Living Family/patient expects to be discharged to:: Private residence Living Arrangements: Other relatives (Lives with brother)   Type of Home: House Home Access: Ramped entrance       Home Layout: One level Home Equipment: BSC/3in1;Electric scooter  Prior Function               Mobility Comments: P)t uses scooter to bed, scooter to chair etc... Pt takes electric scooter in community (to store and fast food resturants) ADLs Comments: Pt reports that he is Mod I ADL's, bathes at sink in kitchen - seated.     Hand Dominance   Dominant Hand: Right    Extremity/Trunk Assessment   Upper Extremity Assessment Upper Extremity Assessment: Defer to OT evaluation    Lower Extremity Assessment Lower Extremity Assessment: RLE deficits/detail;LLE  deficits/detail RLE Deficits / Details: Pt with +4 pitting edema, ROM limited by edema, strength hip flex 3+/5, knee flex/ext 3+/5, ankle dorsi/plantar flex 3/5 RLE Sensation: WNL RLE Coordination: decreased fine motor LLE Deficits / Details: Pt with +4 pitting edema, ROM limited by edema, strength hip flex 3/5, knee flex/ext 3+/5, ankle dorsi/plantar flex 3/5 LLE Sensation: WNL LLE Coordination: decreased fine motor       Communication   Communication: No difficulties  Cognition Arousal/Alertness: Awake/alert Behavior During Therapy: WFL for tasks assessed/performed Overall Cognitive Status: Within Functional Limits for tasks assessed                                          General Comments General comments (skin integrity, edema, etc.): VSS on RA. SpO2 90%O2        Assessment/Plan    PT Assessment Patient needs continued PT services  PT Problem List Decreased strength;Decreased range of motion;Decreased activity tolerance;Decreased mobility;Cardiopulmonary status limiting activity;Obesity       PT Treatment Interventions DME instruction;Gait training;Functional mobility training;Therapeutic activities;Therapeutic exercise;Balance training;Cognitive remediation;Patient/family education    PT Goals (Current goals can be found in the Care Plan section)  Acute Rehab PT Goals Patient Stated Goal: have a drink of water PT Goal Formulation: With patient Time For Goal Achievement: 05/22/22 Potential to Achieve Goals: Good    Frequency Min 3X/week     Co-evaluation   Reason for Co-Treatment: Complexity of the patient's impairments (multi-system involvement);Necessary to address cognition/behavior during functional activity;For patient/therapist safety;To address functional/ADL transfers   OT goals addressed during session: ADL's and self-care;Proper use of Adaptive equipment and DME       AM-PAC PT "6 Clicks" Mobility  Outcome Measure Help needed  turning from your back to your side while in a flat bed without using bedrails?: Total Help needed moving from lying on your back to sitting on the side of a flat bed without using bedrails?: Total Help needed moving to and from a bed to a chair (including a wheelchair)?: Total Help needed standing up from a chair using your arms (e.g., wheelchair or bedside chair)?: Total Help needed to walk in hospital room?: Total Help needed climbing 3-5 steps with a railing? : Total 6 Click Score: 6    End of Session   Activity Tolerance: Other (comment) (worried about increased thirst with movement) Patient left: in bed;with call bell/phone within reach;with bed alarm set Nurse Communication: Mobility status PT Visit Diagnosis: Unsteadiness on feet (R26.81);Muscle weakness (generalized) (M62.81);Other abnormalities of gait and mobility (R26.89);Difficulty in walking, not elsewhere classified (R26.2)    Time: [id number] PT Time Calculation (min) (ACUTE ONLY): 22 min   Charges:   PT Evaluation $PT Eval Moderate Complexity: 1 Mod          Leoncio Hansen B. Migdalia Dk PT, DPT Acute Rehabilitation Services Please use  secure chat or  Call Office [phone]   [place] 05/08/2022, 10:56 AM

## 2022-05-08 NOTE — Progress Notes (Signed)
Subjective:  Patient is lying comfortably in bed this morning, states that he feels much better today and has not had any further emesis overnight. He also reports that his abdominal pain is improved and he has been passing flatus and had one bowel movement yesterday.   Objective:  Vital signs in last 24 hours: Vitals:   05/07/22 0600 05/07/22 0820 05/07/22 2006 05/08/22 0613  BP: 119/66 111/61 121/70 103/60  Pulse:  93 92 95  Resp:  20 20 20  $ Temp: 98 F (36.7 C) (!) 97.3 F (36.3 C) (!) 97.3 F (36.3 C) 98 F (36.7 C)  TempSrc: Oral Oral Oral Oral  SpO2:  91% 91% 90%  Weight:    (!) 155 kg  Height:       Weight change: -0.1 kg  Intake/Output Summary (Last 24 hours) at 05/08/2022 0638 Last data filed at 05/08/2022 0622 Gross per 24 hour  Intake 780.46 ml  Output 2250 ml  Net -1469.54 ml   CBC    Component Value Date/Time   WBC 13.0 (H) 05/08/2022 0617   RBC 4.17 (L) 05/08/2022 0617   HGB 13.8 05/08/2022 0617   HGB 18.4 (H) 04/19/2016 1058   HCT 37.7 (L) 05/08/2022 0617   HCT 56.0 (H) 04/19/2016 1058   PLT 116 (L) 05/08/2022 0617   PLT 171 04/19/2016 1058   MCV 90.4 05/08/2022 0617   MCV 93 04/19/2016 1058   MCH 33.1 05/08/2022 0617   MCHC 36.6 (H) 05/08/2022 0617   RDW 16.9 (H) 05/08/2022 0617   RDW 14.2 04/19/2016 1058   LYMPHSABS 0.8 05/02/2022 1315   MONOABS 0.9 05/02/2022 1315   EOSABS 0.1 05/02/2022 1315   BASOSABS 0.1 05/02/2022 1315   CMP     Component Value Date/Time   NA 131 (L) 05/08/2022 0617   NA 133 (L) 03/05/2022 1127   K 4.0 05/08/2022 0617   CL 93 (L) 05/08/2022 0617   CO2 29 05/08/2022 0617   GLUCOSE 112 (H) 05/08/2022 0617   BUN 27 (H) 05/08/2022 0617   BUN 10 03/05/2022 1127   CREATININE 1.00 05/08/2022 0617   CREATININE 0.92 12/23/2013 0914   CALCIUM 8.2 (L) 05/08/2022 0617   PROT 5.7 (L) 05/08/2022 0617   PROT 6.6 03/05/2022 1127   ALBUMIN 1.8 (L) 05/08/2022 0617   ALBUMIN 2.8 (L) 03/05/2022 1127   AST 119 (H) 05/08/2022  0617   ALT 73 (H) 05/08/2022 0617   ALKPHOS 307 (H) 05/08/2022 0617   BILITOT 4.2 (H) 05/08/2022 0617   BILITOT 1.4 (H) 03/05/2022 1127   GFRNONAA >60 05/08/2022 0617   GFRNONAA >89 12/23/2013 0914   GFRAA 115 12/21/2019 0943   GFRAA >89 12/23/2013 0914    Physical Exam General: Alert, appears comfortable. In no acute distress.  CV: RRR, no murmurs, rubs, or gallops.  Pulm: Expiratory wheezing bilaterally Abd: Firm, non-rigid, non-distended. Tenderness to deep palpation at epigastrium.  Skin: Warm and dry Ext: 3+ pitting edema in LE bilaterally  Neuro: A&O x4 Psych: Answers questions appropriately  Assessment/Plan:  Principal Problem:   Decompensated hepatic cirrhosis (HCC) Active Problems:   Type 2 diabetes mellitus with other specified complication (HCC)   COPD (chronic obstructive pulmonary disease) (HCC)   Obstructive sleep apnea   RA (rheumatoid arthritis) ([place])   Urinary retention   Fluid overload   Adrenal insufficiency (HCC)  Andrew Fowler is a 62 year old man with history significant for cirrhosis, COPD, OSA, Rheumatoid arthritis, T2DM who was admitted for management  of decompensated cirrhosis, with new abdominal pain and vomiting yesterday concerning for possible variceal bleed.   Decompensated cirrhosis Epigastric Abdominal Pain - improving Patient's abdominal pain and vomiting was much improved this morning with no further episodes of emesis today and vitals have been stable. Hemoglobin stable at 13.8, AST, ALT, and Alk phos improved today. EGD scheduled for this afternoon to evaluate for variceal bleed vs gastritis/PUD. CT showed large right-sided pleural effusion, moderate ascites and cholelithiasis, no focal hepatic lesions or biliary duct dilatation. Will consult IR for paracentesis, given this is patient's first episode of cirrhotic decompensation. Patient has not had increased work of breathing and stable on current COPD regimen. Patient's output was 2.25L  (net -1.5L) over the last 24h, net -11.6L since admission, creatinine has been stable at 1.0. Will continue to diurese today.  -IR consult for paracentesis -Appreciate GI ongoing recommendations -EGD today -IV Furosemide 662m daily -Spironolactone 10662mdaily -Transfuse for Hgb <7 -IV morphine 62m862m4h PRN -Zofran 4mg662mh PRN -Maalox 30mL63m PRN -PT/OT -Low sodium diet  Adrenal insufficiency Chronic steroid use in the setting of rheumatoid arthritis Cosyntropin stimulation test consistent with adrenal insufficiency, restarted on home dose of prednisone this admission.  -Prednisone 5mg  61mD  No signs or symptoms of COPD exacerbation.  - Brovana nebulizer BID  - Incurse Ellipita 1 puff daily - DuoNeb 3mL q61m Urinary retention Day 6 of foley catheter for urinary retention.  -Consider trial of void tomorrow   Chronic Pain syndrome  Patient has history of chronic hip pain treated with Norco.  - Continue home Norco 10-325mg 1 39met q6h PRN   Anxiety Patient has been on stable dose of alprazolam since 2017 for anxiety, PDMPs have been appropriate.  - Alprazolam 0.5mg BID 18m    Type 2 diabetes  CBG 113 this morning. Not on any home medications for diabetes. Restarted on home prednisone 5mg this 7mission, will continue to monitor CBG.   -Trend daily CBG  Full Code Brother, David, is [employer] traffic controllerld lovenox today for EGD IVF: None Diet: Low sodium Family Updates: None today    LOS: 5 days   Halbert Jesson, AnSpero CurbStudent 05/08/2022, 6:38 AM

## 2022-05-08 NOTE — Progress Notes (Incomplete)
Subjective:  Patient Andrew Fowler reports feeling okay this morning, still can feel the fluid in his abdomen and legs. Expressed understanding his cirrhosis diagnosis, the severity of which was communicated to him. Explained how cirrhosis can lead to fluid retention causing swelling. Discussed importance of sodium restriction. He is rightfully concerned about his liver. All of his questions were answered.    Objective: Vitals over previous 24hr: Vitals:   05/08/22 1239 05/08/22 1437 05/08/22 1447 05/08/22 1500  BP: 103/60 (!) 95/58 108/60 119/62  Pulse: 89 89 90 88  Resp: 15 16 15 19  $ Temp:  98.4 F (36.9 C)    TempSrc: Temporal Temporal    SpO2: 93% 93% 91% 92%  Weight:      Height:        General:      awake and alert, lying comfortably in bed, cooperative, not in acute distress Lungs:      normal respiratory effort, breathing unlabored, symmetrical chest rise, mild expiratory wheezing, no crackles Cardiac:      regular rate and rhythm, distant S1 and S2, 2+ pitting edema up to thighs bilaterally Abdomen:      soft and non-distended, normoactive bowel sounds, 1+ flank edema Neurologic:      oriented to person-place-time, no gross focal deficits Psychiatric:      euthymic mood with congruent affect, intelligible speech    Assessment/Plan: Mr Volkov is a 62 year old male with a past medical history of morbid obesity, diabetes, COPD, and rheumatoid arthritis who presented with increased lower extremity swelling and breath shortness, now admitted for treatment of decompensated cirrhosis.    ---Decompensated cirrhosis ---Hyponatremia Patient presented with lower body swelling and breath shortness. Exam demonstrated marked fluid overload and stigmata of cirrhosis. His weight had increased from 127kg in 02-2022 to 162kg. Prior to admission, patient had hepatic steatosis and fibrosis identified via biopsy. Laboratory testing including Na, Alb, TBili, AST, ALT, and PT consistent  with cirrhosis. Intravenous diuresis with furosemide and spironolactone was initiated upon arrival. Most recent weight 155kg, net fluid output since admission has been 7.3L.  > Furosemide 37m IV q24 > Spironolactone 1020mq24 > Diet low sodium > Monitor ins-outs > Trend CMP q24 > Trend CBC q24 > Trend PT-INR q48 > Physical-occupational therapy    ---Hematemesis ---Abdominal pain Patient developed acute abdominal pain and experienced several episodes of rust-colored emesis on 2-13. Subsequent upper endoscopy negative for esophageal, variceal, or peptic ulcer bleed. Despite appropriate fasting, large amount of food was present in stomach suggesting possible underlying gastroparesis. Abdominal CT notable for cirrhosis, large-volume ascites, and large right pleural effusion. Pain was improved significantly today 2-15. > Pantoprazole 4086m24 > Consider gastric emptying study as outpatient   ---Adrenal insufficiency secondary to chronic steroid use ---Rheumatoid arthritis Patient has history of rheumatoid arthritis managed at home with prednisone 5mg32mily. Reports that he ran out of this medication a while ago. Upon arrival, patient exhibited signs of adrenal insufficiency including hypotension and hyponatremia. Cosyntropin stimulation test consistent with adrenal insufficiency. Prednisone at home dose was started on 2-11. > Prednisone 5mg 18m   ---Chronic obstructive pulmonary disease Patient presented with worsening dyspnea and productive cough, both of which explained by decompensated cirrhosis. His COPD is managed at home with tiotropium-olodaterol. Breathing has improved since starting diuresis and SpO2 values remain at or near target range of 88-92%. > Arformoterol 15ug 12 > Umeclidinium 62.5ug q24 > Ipratropium-albuterol 0.5-2.5mg q65m ---Urinary retention Patient failed outpatient diuresis and was markedly volume  overloaded upon arrival to the ED. He reported decreased urinary  frequency and bladder ultrasound demonstrated >956m. Foley catheter was placed, resulting in appropriate urine output. Currently on day four post Foley placement. > Continue Foley catheter > Consider voiding trial around 2-15   ---Chronic pain syndrome  Patient has history of chronic hip pain treated with hydrocodone-acetaminophen. Last bowel movement per electronic medical record 2-8. > Hydrocodone-acetaminophen 10-329mq6 PRN   ---Anxiety  Patient has history of anxiety managed at home with alprazolam, which was first prescribed in 2017.  > Alprazolam 0.60m48m12   ---Diabetes II Patient has history of diabetes unmanaged by medications. Blood glucose has been below 200 since admission. Primary team will continue to monitor, especially given re-initiation of prednisone. > Trend CBG q24     Principal Problem:   Decompensated hepatic cirrhosis (HCC) Active Problems:   Type 2 diabetes mellitus with other specified complication (HCC)   COPD (chronic obstructive pulmonary disease) (HCC)   Obstructive sleep apnea   [place]rheumatoid [place]) ([facility] Urinary retention   Fluid overload   Adrenal insufficiency ([facility]  Prior to Admission Living Arrangement:  Anticipated Discharge Location:  Barriers to Discharge:  Dispo: Anticipated discharge in approximately 5-7 day(s).    [place] NickelD Internal Medicine PGY-1 Pager [phone] 5pm on weekdays and 1pm on weekends: On Call pager [phone]

## 2022-05-08 NOTE — Transfer of Care (Signed)
Immediate Anesthesia Transfer of Care Note  Patient: Andrew Fowler  Procedure(s) Performed: ESOPHAGOGASTRODUODENOSCOPY (EGD) WITH PROPOFOL  Patient Location: PACU  Anesthesia Type:General  Level of Consciousness: awake, alert , and oriented  Airway & Oxygen Therapy: Patient Spontanous Breathing  Post-op Assessment: Report given to RN and Post -op Vital signs reviewed and stable  Post vital signs: Reviewed and stable  Last Vitals:  Vitals Value Taken Time  BP 95/58 05/08/22 1437  Temp    Pulse 92 05/08/22 1438  Resp 26 05/08/22 1438  SpO2 91 % 05/08/22 1438  Vitals shown include unvalidated device data.  Last Pain:  Vitals:   05/08/22 1239  TempSrc: Temporal  PainSc: 0-No pain      Patients Stated Pain Goal: 0 ([id number])  Complications: No notable events documented.

## 2022-05-08 NOTE — Op Note (Signed)
[facility] Patient Name: Andrew Fowler Procedure Date : 05/08/2022 MRN: [id number] Attending MD: Gladstone Pih. Candis Schatz , MD, [id number] Date of Birth: 04/08/1960 CSN: [id number] Age: 62 Admit Type: Inpatient Procedure:                Upper GI endoscopy Indications:              Epigastric abdominal pain, Suspected hematemesis                            (rust colored emesis) Providers:                Gladstone Pih. Candis Schatz, MD, Jaci Carrel, RN,                            Luan Moore, Technician, Elmer Sow Referring MD:              Medicines:                Monitored Anesthesia Care Complications:            No immediate complications. Estimated Blood Loss:     Estimated blood loss: none. Procedure:                Pre-Anesthesia Assessment:                           - Prior to the procedure, a History and Physical                            was performed, and patient medications and                            allergies were reviewed. The patient's tolerance of                            previous anesthesia was also reviewed. The risks                            and benefits of the procedure and the sedation                            options and risks were discussed with the patient.                            All questions were answered, and informed consent                            was obtained. Prior Anticoagulants: The patient has                            taken no anticoagulant or antiplatelet agents. ASA                            Grade Assessment: IV - A patient with severe  systemic disease that is a constant threat to life.                            After reviewing the risks and benefits, the patient                            was deemed in satisfactory condition to undergo the                            procedure.                           After obtaining informed consent, the endoscope was                            passed  under direct vision. Throughout the                            procedure, the patient's blood pressure, pulse, and                            oxygen saturations were monitored continuously. The                            [id number]-[id number] Olympus endoscope was introduced                            through the mouth, with the intention of advancing                            to the duodenum. The scope was advanced to the                            antrum before the procedure was aborted.                            Medications were given. The procedure was aborted                            due to presence of food. The upper GI endoscopy was                            accomplished without difficulty. The patient                            tolerated the procedure well. Scope In: Scope Out: Findings:      The examined portions of the nasopharynx, oropharynx and larynx were       normal.      Esophagitis with no bleeding was found. Accurate grading of the       esophagitis not done because of presence of food overlying the GEJ.       Procedure was quickly terminated in order to avoid potential aspiration       event      The  exam of the esophagus was otherwise normal. No esophageal varices       were present.      A large amount of food (residue) was found in the gastric fundus and in       the gastric body.      The exam of the stomach was otherwise normal. Impression:               - The procedure was aborted due to presence of food.                           - The examined portions of the nasopharynx,                            oropharynx and larynx were normal.                           - Suspected reflux esophagitis with no bleeding. No                            esophageal varices present.                           - A large amount of food (residue) in the stomach.                            Patient may have underlying gastroparesis                           - No specimens  collected.                           - Although the stomach was not completely                            visualized, the antrum/pylorus appeared                            unremarkable without evidence of peptic ulcer                            disease Moderate Sedation:      N/A Recommendation:           - Return patient to hospital ward for ongoing care.                           - Resume previous diet.                           - Continue present medications.                           - Recommend once daily PPI                           - Consider outpatient evaluation for gastroparesis                           -  Continue diuresis per primary team                           - GI will sign off for now. Please reconsult for                            any new questions/concerns                           - Will arrange outpatient follow up with Dr.                            Carlean Purl. Procedure Code(s):        --- Professional ---                           [phone], 52, Esophagogastroduodenoscopy, flexible,                            transoral; diagnostic, including collection of                            specimen(s) by brushing or washing, when performed                            (separate procedure) Diagnosis Code(s):        --- Professional ---                           K21.00, Gastro-esophageal reflux disease with                            esophagitis, without bleeding                           R10.13, Epigastric pain                           K92.0, Hematemesis CPT copyright 2022 [employer]. All rights reserved. The codes documented in this report are preliminary and upon coder review may  be revised to meet current compliance requirements. Jawara Latorre E. Candis Schatz, MD 05/08/2022 2:40:04 PM This report has been signed electronically. Number of Addenda: 0

## 2022-05-08 NOTE — Evaluation (Signed)
Occupational Therapy Evaluation Patient Details Name: Andrew Fowler MRN: [id number] DOB: 1960-10-16 Today's Date: 05/08/2022   History of Present Illness Andrew Fowler is a 62 y.o. male with a past medical history of cirrhosis, COPD, OSA, rheumatoid arthritis, type 2 diabetes and multiple others listed below, who presented to the ED on 05/02/2022 for worsening of leg and abdominal swelling, admitted w/ dx: decompensated hepatic cirrhosis   Clinical Impression   Pt admitted as above, presenting with deficits as listed below (refer to OT problem list for details).  Pt lives with brother in 1 story home with ramped entrance. He uses scooter for mobility, bathes in kitchen at sink and uses 3:1 placed in his bedroom at baseline. Pt was agreeable to bedside eval only this date, declining to sit up at EOB and declining transfers as well. Pt is currently limited by decreased activity tolerance, pitting edema in bilateral LE's and weight gain impacting his ability to perform ADL's/transfers. Will follow acutely to assist with maximizing independence with ADL's and self care tasks. Consider sitting up EOB or transfer to scooter next visit for sink level ADL's, pt education re: energy conservation techniques and LB a/e.     Recommendations for follow up therapy are one component of a multi-disciplinary discharge planning process, led by the attending physician.  Recommendations may be updated based on patient status, additional functional criteria and insurance authorization.   Follow Up Recommendations  Home health OT     Assistance Recommended at Discharge PRN  Patient can return home with the following A little help with bathing/dressing/bathroom    Functional Status Assessment  Patient has had a recent decline in their functional status and demonstrates the ability to make significant improvements in function in a reasonable and predictable amount of time.  Equipment Recommendations  Other  (comment) (? a/e for LB ADL's)    Recommendations for Other Services       Precautions / Restrictions Precautions Precautions: Fall Precaution Comments: Pt uses scooter at home and performs transfers from bed/chair to scooter Restrictions Weight Bearing Restrictions: No      Mobility Bed Mobility Overal bed mobility:  (Pt seen for limited bedside eval this date)    Transfers   General transfer comment: Pt uses scooter and SPT (to 3:1, bed, chair at baseline level). Pt declined all OOB activity including sitting up at EOB this date stating that he is NPO and will require a drink to participate more. Discussed role of OT/PT with pt and he is agreeable to OOB transfers when NPO is discontinued per his report today.      Balance Overall balance assessment:  (TBA - limited bedside eval this date)     ADL either performed or assessed with clinical judgement   ADL Overall ADL's : Needs assistance/impaired Eating/Feeding: NPO   Grooming: Set up;Bed level   Upper Body Bathing: Set up;Bed level   Lower Body Bathing: +2 for physical assistance;+2 for safety/equipment;Bed level;Maximal assistance Lower Body Bathing Details (indicate cue type and reason): Pt declined OOB activity this date secondary to being NPO - stating "I will need a drink and energy and I can't do that right now" Upper Body Dressing : Set up;Moderate assistance;Bed level   Lower Body Dressing: Maximal assistance;Total assistance;+2 for physical assistance;+2 for safety/equipment;Bed level Lower Body Dressing Details (indicate cue type and reason): Pt declined OOB activity this date secondary to being NPO - stating "I will need a drink and energy and I can't do that right  nowScientist, research (life sciences) Details (indicate cue type and reason): Pt declined OOB activity this date - TBD. Limited bedside eval (Pt reports that he does SPT from scooter to 3:1 in his bedroom at baseline - scooter does not fit into bathroom per pt  report)       Tub/Shower Transfer Details (indicate cue type and reason): Pt states that he sponge bathes at sink in kitchen at home at baseline Functional mobility during ADLs:  (Limited bedside eval as pt declined OOB activity this date) General ADL Comments: Discussed Role of OT/PT and overall goal to assist with increased independence and strength to enable pt to return to PLOF. Pt verbalized understanding of this and is agreeable to OOB activity when NPO orders are discontinued so he can have more energy per his report. Pt sponge bathes at kitchen sink and performs SPT from bed/chair/3:1 to scooter at baseline level. Pt may benefit from acute OT to assist with LE ADL's, transfer training and a/e, pt education.     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 Pt declined OOB activity including sitting up at EOB this date secondary to being NPO and he would need a drink to participate further. States he will be able to do more when NPO  is discontinued/at later date            Home Living Family/patient expects to be discharged to:: Private residence Living Arrangements: Other relatives (Lives with brother)   Type of Home: House Home Access: Ramped entrance   [place]: One level   Bathroom Shower/Tub: Tub/shower unit;Sponge bathes at baseline;Other (comment) (Pt reports that he bathes at the sink)   Bathroom Toilet: Handicapped height   Home Equipment: BSC/3in1;Electric scooter     Prior Functioning/Environment   Mobility Comments: P)t uses scooter to bed, scooter to chair etc... Pt takes electric scooter in community (to store and fast food resturants) ADLs Comments: Pt reports that he is Mod I ADL's, bathes at sink in kitchen - seated.        OT Problem List: Decreased knowledge of use of DME or AE;Decreased knowledge of precautions;Decreased activity tolerance;Cardiopulmonary status limiting activity;Impaired balance (sitting and/or standing)      OT Treatment/Interventions: Self-care/ADL training;Patient/family education;Energy conservation;Therapeutic activities;DME and/or AE instruction (LB a/e)    OT Goals(Current goals can be found in the care plan section) Acute Rehab OT Goals Patient Stated Goal: Be able to drink water and eat OT Goal Formulation: With patient Time For Goal Achievement: 05/22/22 Potential to Achieve Goals: Good  OT Frequency: Min 2X/week    Co-evaluation PT/OT/SLP Co-Evaluation/Treatment:  Yes Reason for Co-Treatment: Complexity of the patient's impairments (multi-system involvement);Necessary to address cognition/behavior during functional activity;For patient/therapist safety;To address functional/ADL transfers   OT goals addressed during session: ADL's and self-care;Proper use of Adaptive equipment and DME      AM-PAC OT "6 Clicks" Daily Activity     Outcome Measure Help from another person eating meals?: None (NPO) Help from another person taking care of personal  grooming?: A Little Help from another person toileting, which includes using toliet, bedpan, or urinal?: A Lot Help from another person bathing (including washing, rinsing, drying)?: A Lot Help from another person to put on and taking off regular upper body clothing?: A Little Help from another person to put on and taking off regular lower body clothing?: Total 6 Click Score: 15   End of Session Equipment Utilized During Treatment: Oxygen Nurse Communication: Other (comment) (Pt declined OOB activity this date)  Activity Tolerance: Patient tolerated treatment well;Other (comment) (Pt declined sitting up at EOB and all OOB activity this date) Patient left:    OT Visit Diagnosis: Other abnormalities of gait and mobility (R26.89);Other (comment) (Deconditioning)                Time: [id number] OT Time Calculation (min): 20 min Charges:  OT General Charges $OT Visit: 1 Visit OT Evaluation $OT Eval Low Complexity: 1 Low  Kresha Abelson Beth Dixon, OTR/L 05/08/2022, 9:27 AM

## 2022-05-08 NOTE — Interval H&P Note (Signed)
History and Physical Interval Note:  05/08/2022 1:08 PM  Andrew Fowler  has presented today for surgery, with the diagnosis of Hematemesis.  The various methods of treatment have been discussed with the patient and family. After consideration of risks, benefits and other options for treatment, the patient has consented to  Procedure(s): ESOPHAGOGASTRODUODENOSCOPY (EGD) WITH PROPOFOL (N/A) as a surgical intervention.  The patient's history has been reviewed, patient examined, no change in status, stable for surgery.  I have reviewed the patient's chart and labs.  Questions were answered to the patient's satisfaction.    The patient has had resolution of the significant abdominal pain, nausea and vomiting that he was experiencing yesterday.  He reports that he got some pain meds and anxiety meds last night and went to sleep.  When he awoke this morning, his symptoms were gone.  Will proceed with EGD to exclude PUD/esophagitis and screen for esophageal varices.   Daryel November

## 2022-05-08 NOTE — Anesthesia Preprocedure Evaluation (Addendum)
Anesthesia Evaluation  Patient identified by MRN, date of birth, ID band Patient awake    Reviewed: Allergy & Precautions, NPO status , Patient's Chart, lab work & pertinent test results, reviewed documented beta blocker date and time   Airway Mallampati: III       Dental  (+) Dental Advisory Given, Poor Dentition, Missing, Chipped   Pulmonary shortness of breath and with exertion, sleep apnea , COPD,  COPD inhaler, Current Smoker and Patient abstained from smoking.   Pulmonary exam normal breath sounds clear to auscultation       Cardiovascular hypertension, Pt. on medications Normal cardiovascular exam Rhythm:Regular Rate:Normal     Neuro/Psych  PSYCHIATRIC DISORDERS Anxiety Depression    negative neurological ROS     GI/Hepatic ,,,(+) Hepatitis -, Unspecified, BNASH Elevated LFT's Hematemesis   Endo/Other    Morbid obesityHyperlipidemia Hyperglycemia- steroid induced  Renal/GU negative Renal ROS  negative genitourinary   Musculoskeletal  (+) Arthritis , Rheumatoid disorders,    Abdominal  (+) + obese  Peds  Hematology  (+) Blood dyscrasia, anemia Thrombocytopenia   Anesthesia Other Findings   Reproductive/Obstetrics                             Anesthesia Physical Anesthesia Plan  ASA: 3  Anesthesia Plan: MAC   Post-op Pain Management: Minimal or no pain anticipated   Induction: Intravenous  PONV Risk Score and Plan: 1 and Treatment may vary due to age or medical condition, Propofol infusion and Ondansetron  Airway Management Planned: Natural Airway and Nasal Cannula  Additional Equipment: None  Intra-op Plan:   Post-operative Plan:   Informed Consent: I have reviewed the patients History and Physical, chart, labs and discussed the procedure including the risks, benefits and alternatives for the proposed anesthesia with the patient or authorized representative who has  indicated his/her understanding and acceptance.     Dental advisory given  Plan Discussed with: Anesthesiologist and CRNA  Anesthesia Plan Comments:         Anesthesia Quick Evaluation

## 2022-05-08 NOTE — Anesthesia Postprocedure Evaluation (Signed)
Anesthesia Post Note  Patient: [name]  Procedure(s) Performed: ESOPHAGOGASTRODUODENOSCOPY (EGD) WITH PROPOFOL     Patient location during evaluation: PACU Anesthesia Type: MAC Level of consciousness: awake and alert Pain management: pain level controlled Vital Signs Assessment: post-procedure vital signs reviewed and stable Respiratory status: spontaneous breathing, nonlabored ventilation, respiratory function stable and patient connected to nasal cannula oxygen Cardiovascular status: stable and blood pressure returned to baseline Postop Assessment: no apparent nausea or vomiting Anesthetic complications: no  No notable events documented.  Last Vitals:  Vitals:   05/08/22 1447 05/08/22 1500  BP: 108/60 119/62  Pulse: 90 88  Resp: 15 19  Temp:    SpO2: 91% 92%    Last Pain:  Vitals:   05/08/22 1500  TempSrc:   PainSc: 0-No pain                 Effie Berkshire

## 2022-05-09 ENCOUNTER — Telehealth: Payer: Self-pay

## 2022-05-09 ENCOUNTER — Inpatient Hospital Stay (HOSPITAL_COMMUNITY): Payer: Medicaid Other

## 2022-05-09 DIAGNOSIS — K7460 Unspecified cirrhosis of liver: Secondary | ICD-10-CM | POA: Diagnosis not present

## 2022-05-09 DIAGNOSIS — E871 Hypo-osmolality and hyponatremia: Secondary | ICD-10-CM | POA: Diagnosis not present

## 2022-05-09 DIAGNOSIS — R188 Other ascites: Secondary | ICD-10-CM | POA: Diagnosis not present

## 2022-05-09 DIAGNOSIS — K7290 Hepatic failure, unspecified without coma: Secondary | ICD-10-CM | POA: Diagnosis not present

## 2022-05-09 HISTORY — PX: IR PARACENTESIS: IMG2679

## 2022-05-09 LAB — ALBUMIN, PLEURAL OR PERITONEAL FLUID: Albumin, Fluid: <1.5 g/dL

## 2022-05-09 LAB — BODY FLUID CELL COUNT WITH DIFFERENTIAL
Eos, Fluid: 0 %
Lymphs, Fluid: 46 %
Monocyte-Macrophage-Serous Fluid: 27 % — ABNORMAL LOW (ref 50–90)
Neutrophil Count, Fluid: 27 % — ABNORMAL HIGH (ref 0–25)
Total Nucleated Cell Count, Fluid: 116 cu mm (ref 0–1000)

## 2022-05-09 LAB — LACTATE DEHYDROGENASE, PLEURAL OR PERITONEAL FLUID: LD, Fluid: 25 U/L — ABNORMAL HIGH (ref 3–23)

## 2022-05-09 LAB — COMPREHENSIVE METABOLIC PANEL WITH GFR
ALT: 67 U/L — ABNORMAL HIGH (ref 0–44)
AST: 113 U/L — ABNORMAL HIGH (ref 15–41)
Albumin: 1.7 g/dL — ABNORMAL LOW (ref 3.5–5.0)
Alkaline Phosphatase: 277 U/L — ABNORMAL HIGH (ref 38–126)
Anion gap: 14 (ref 5–15)
BUN: 27 mg/dL — ABNORMAL HIGH (ref 8–23)
CO2: 23 mmol/L (ref 22–32)
Calcium: 8.1 mg/dL — ABNORMAL LOW (ref 8.9–10.3)
Chloride: 94 mmol/L — ABNORMAL LOW (ref 98–111)
Creatinine, Ser: 1.05 mg/dL (ref 0.61–1.24)
GFR, Estimated: >60 mL/min
Glucose, Bld: 124 mg/dL — ABNORMAL HIGH (ref 70–99)
Potassium: 4 mmol/L (ref 3.5–5.1)
Sodium: 131 mmol/L — ABNORMAL LOW (ref 135–145)
Total Bilirubin: 3.3 mg/dL — ABNORMAL HIGH (ref 0.3–1.2)
Total Protein: 5.5 g/dL — ABNORMAL LOW (ref 6.5–8.1)

## 2022-05-09 LAB — CBC
HCT: 36.5 % — ABNORMAL LOW (ref 39.0–52.0)
Hemoglobin: 13.1 g/dL (ref 13.0–17.0)
MCH: 32.8 pg (ref 26.0–34.0)
MCHC: 35.9 g/dL (ref 30.0–36.0)
MCV: 91.3 fL (ref 80.0–100.0)
Platelets: 100 10*3/uL — ABNORMAL LOW (ref 150–400)
RBC: 4 MIL/uL — ABNORMAL LOW (ref 4.22–5.81)
RDW: 16.9 % — ABNORMAL HIGH (ref 11.5–15.5)
WBC: 11.6 10*3/uL — ABNORMAL HIGH (ref 4.0–10.5)
nRBC: 0 % (ref 0.0–0.2)

## 2022-05-09 LAB — GRAM STAIN

## 2022-05-09 LAB — LACTATE DEHYDROGENASE: LDH: 125 U/L (ref 98–192)

## 2022-05-09 MED ORDER — LIDOCAINE HCL 1 % IJ SOLN
INTRAMUSCULAR | Status: AC
  Administered 2022-05-09: 8 mL
  Filled 2022-05-09: qty 20

## 2022-05-09 NOTE — Progress Notes (Signed)
PT Cancellation Note  Patient Details Name: Andrew Fowler MRN: [id number] DOB: 04-May-1960   Cancelled Treatment:    Reason Eval/Treat Not Completed: (P) Patient at procedure or test/unavailable Pt off floor for thorocentisis,PT will follow back for treatment this afternoon as able.  [name] B. Migdalia Dk PT, DPT Acute Rehabilitation Services Please use secure chat or  Call Office [phone]    [place] 05/09/2022, 11:08 AM

## 2022-05-09 NOTE — Telephone Encounter (Signed)
Chart reviewed Pt admitted to Hospital Pt scheduled for follow up on 05/23/2021 at 11:30 to see Dr. Carlean Purl

## 2022-05-09 NOTE — Progress Notes (Signed)
Physical Therapy Treatment Patient Details Name: Andrew Fowler MRN: [id number] DOB: [birth year] Today's Date: 05/09/2022   History of Present Illness [name] is a 62 y.o. male who presented to the ED on 05/02/2022 for worsening of leg and abdominal swelling, admitted w/ dx: decompensated hepatic cirrhosis. S/p paracentesis from RLQ 2/15. PMH: cirrhosis, COPD, OSA, rheumatoid arthritis, type 2 diabetes, gout, HTN, HLD    PT Comments    Pt is motivated to return home. Pt participatory in practicing bed mobility and transfers to facilitate safe d/c home once medically cleared. He required minA for bed mobility with HOB elevated and minA to steady with transfers to stand and pivot from bed to scooter, transitioning his hands from the bed to the scooter for UE support/stability with transfers. Pt is interested in gait training with an Multimedia programmer. Will plan to try to progress to gait training as able. Will continue to follow acutely. As pt reports his brother can provide the physical assistance he current needs, current recommendations remain appropriate.     Recommendations for follow up therapy are one component of a multi-disciplinary discharge planning process, led by the attending physician.  Recommendations may be updated based on patient status, additional functional criteria and insurance authorization.  Follow Up Recommendations  Home health PT     Assistance Recommended at Discharge Frequent or constant Supervision/Assistance  Patient can return home with the following A lot of help with walking and/or transfers;A lot of help with bathing/dressing/bathroom;Assistance with cooking/housework;Assist for transportation;Help with stairs or ramp for entrance   Equipment Recommendations  None recommended by PT    Recommendations for Other Services       Precautions / Restrictions Precautions Precautions: Fall Precaution Comments: Pt uses scooter at home and performs transfers from  bed/chair to scooter Restrictions Weight Bearing Restrictions: No     Mobility  Bed Mobility Overal bed mobility: Needs Assistance Bed Mobility: Supine to Sit     Supine to sit: Min assist, HOB elevated     General bed mobility comments: MinA for pt to pull on therapist's hand to ascend trunk and scoot to EOB with HOB elevated.    Transfers Overall transfer level: Needs assistance Equipment used: None Transfers: Sit to/from Stand, Bed to chair/wheelchair/BSC Sit to Stand: Min assist, From elevated surface Stand pivot transfers: Min assist         General transfer comment: Scooter placed proximal to EOB on pt's R to simulate home set-up. MinA to power up and steady pt when coming to stand from slightly elevated EOB (to simulate home) and switch R hand to distal armrest of scooter to steady himself and pivot to the scooter seat.    Ambulation/Gait               General Gait Details: deferred   Stairs             Wheelchair Mobility    Modified Rankin (Stroke Patients Only)       Balance Overall balance assessment: Needs assistance Sitting-balance support: No upper extremity supported, Feet supported, Bilateral upper extremity supported, Single extremity supported Sitting balance-Leahy Scale: Fair Sitting balance - Comments: UE support when trying to slide feet into shoes, otherwise no UE support for static sitting   Standing balance support: Bilateral upper extremity supported, During functional activity, Reliant on assistive device for balance Standing balance-Leahy Scale: Poor Standing balance comment: Reliant on UE support and up to minA  Cognition Arousal/Alertness: Awake/alert Behavior During Therapy: WFL for tasks assessed/performed Overall Cognitive Status: Within Functional Limits for tasks assessed                                          Exercises      General Comments General  comments (skin integrity, edema, etc.): educated pt on AROM of legs to reduce OA pain and edema      Pertinent Vitals/Pain Pain Assessment Pain Assessment: Faces Faces Pain Scale: Hurts little more Pain Location: legs Pain Descriptors / Indicators: Discomfort Pain Intervention(s): Limited activity within patient's tolerance, Monitored during session    Home Living                          Prior Function            PT Goals (current goals can now be found in the care plan section) Acute Rehab PT Goals Patient Stated Goal: to go home tomorrow PT Goal Formulation: With patient Time For Goal Achievement: 05/22/22 Potential to Achieve Goals: Good Progress towards PT goals: Progressing toward goals    Frequency    Min 3X/week      PT Plan Current plan remains appropriate    Co-evaluation              AM-PAC PT "6 Clicks" Mobility   Outcome Measure  Help needed turning from your back to your side while in a flat bed without using bedrails?: A Little Help needed moving from lying on your back to sitting on the side of a flat bed without using bedrails?: A Little Help needed moving to and from a bed to a chair (including a wheelchair)?: A Little Help needed standing up from a chair using your arms (e.g., wheelchair or bedside chair)?: A Little Help needed to walk in hospital room?: Total Help needed climbing 3-5 steps with a railing? : Total 6 Click Score: 14    End of Session Equipment Utilized During Treatment: Gait belt Activity Tolerance: Patient tolerated treatment well Patient left: with call bell/phone within reach;Other (comment) (in his scooter) Nurse Communication: Mobility status (no chair alarm due to being in his personal scooter to explore hallways of unit) PT Visit Diagnosis: Unsteadiness on feet (R26.81);Muscle weakness (generalized) (M62.81);Other abnormalities of gait and mobility (R26.89);Difficulty in walking, not elsewhere classified  (R26.2)     Time: :[id number] PT Time Calculation (min) (ACUTE ONLY): 24 min  Charges:  $Therapeutic Activity: 23-37 mins                     Moishe Spice, PT, DPT Acute Rehabilitation Services  Office: [phone]    Orvan Falconer 05/09/2022, 3:38 PM

## 2022-05-09 NOTE — Telephone Encounter (Signed)
Message Received: Today Daryel November, MD  Gatha Mayer, MD; Gillermina Hu, RN Remo Lipps, Can you get a routine hospital follow up for [name] within the next mont?  Decompensated cirrhosis with anasarca.  Will need to reassess diuretic dosing/fluid status

## 2022-05-09 NOTE — Progress Notes (Signed)
Occupational Therapy Treatment Patient Details Name: Andrew Fowler MRN: [id number] DOB: [birth year] Today's Date: 05/09/2022   History of present illness Andrew Fowler is a 62 y.o. male with a past medical history of cirrhosis, COPD, OSA, rheumatoid arthritis, type 2 diabetes and multiple others listed below, who presented to the ED on 05/02/2022 for worsening of leg and abdominal swelling, admitted w/ dx: decompensated hepatic cirrhosis   OT comments  Pt seen for ADL retraining session today with goal of sitting up at EOB and SPT to 3:1. Pt agreeable to sitting up at EOB initially, then stated he would do it later when he "lost more fluid weight". Pt declined OOB activity again this date, including transfer to 3:1 and sitting up at EOB despite maximal verbal encouragement. Agreed only to grooming & bathing at bed level. Pt is currently Mod I/set up grooming bed level with HOB elevated and upper body bathing at supervision/set up with HOB elevated. Discussed energy conservation techniques, balancing rest and activity as well as planning his day to increase activity tolerance. Also discussed the use of possible a/e for assistance with LB ADL's. Handout was issued and reviewed with pt. Recommend SNF Rehab, discharge plan updated.   Recommendations for follow up therapy are one component of a multi-disciplinary discharge planning process, led by the attending physician.  Recommendations may be updated based on patient status, additional functional criteria and insurance authorization.    Follow Up Recommendations  Skilled nursing-short term rehab (<3 hours/day)     Assistance Recommended at Discharge PRN  Patient can return home with the following  A lot of help with walking and/or transfers;A lot of help with bathing/dressing/bathroom   Equipment Recommendations  Other (comment) (? a/e for LB ADL's)    Recommendations for Other Services      Precautions / Restrictions  Precautions Precautions: Fall Precaution Comments: Pt uses scooter at home and performs transfers from bed/chair to scooter       Mobility Bed Mobility     General bed mobility comments: Pt declined/refused bed mobility and sitting up at EOB this date until he "loses more weight"/fluid.    Transfers Overall transfer level:  (Pt declined OOB activity this date)     Balance Overall balance assessment:  (TBA)     ADL either performed or assessed with clinical judgement   ADL Overall ADL's : Needs assistance/impaired Eating/Feeding: Bed level;Modified independent;Set up   Grooming: Set up;Modified independent;Wash/dry face;Wash/dry hands   Upper Body Bathing: Set up;Bed level;Supervision/ safety Upper Body Bathing Details (indicate cue type and reason): Sitting up in bed - pt agreeable to sitting up at EOB initially, then stated he would do it later when he "lost more fluid weight". Agreed only to bathing at bed level. Lower Body Bathing: +2 for physical assistance;+2 for safety/equipment;Bed level;Maximal assistance Lower Body Bathing Details (indicate cue type and reason): Sitting up in bed - pt agreeable to sitting up at EOB initially, then stated he would do it later when he "lost more fluid weight". Agreed only to bathing at bed level.   Toilet Transfer Details (indicate cue type and reason): Pt declined OOB activity this date, including transfer to 3:1 and sitting up at EOB despite maximal verbal encouragement.   General ADL Comments: Pt seen for ADL retraining session today with goal of sitting up at EOB and SPT to 3:1. Pt agreeable to sitting up at EOB initially, then stated he would do it later when he "lost more fluid weight". Pt  declined OOB activity again this date, including transfer to 3:1 and sitting up at EOB despite maximal verbal encouragement. Agreed only to grooming & bathing at bed level. Pt is currently Mod I/set up grooming bed level with HOB elevated and upper  body bathing at supervision/set up with HOB elevated. Discussed energy conservation techniques, balancing rest and activity as well as planning his day to increase activity tolerance. Also discussed the use of possible a/e for assistance with LB ADL's. Handout was issued and reviewed with pt.    Extremity/Trunk Assessment Upper Extremity Assessment Upper Extremity Assessment: Generalized weakness;Overall [facility] for tasks assessed   Lower Extremity Assessment Lower Extremity Assessment: Defer to PT evaluation        Vision Ability to See in Adequate Light: 0 Adequate     Perception     Praxis      Cognition Arousal/Alertness: Awake/alert Behavior During Therapy: WFL for tasks assessed/performed Overall Cognitive Status: Within Functional Limits for tasks assessed             General Comments VSS on RA    Pertinent Vitals/ Pain       Pain Assessment Pain Assessment: No/denies pain  Home Living Family/patient expects to be discharged to:: Private residence Living Arrangements: Other relatives (Lives with brother) Type of Home: House Home Access: Ramped entrance Home Layout: One level Bathroom Shower/Tub: Tub/shower unit;Sponge bathes at baseline;Other (comment) (Pt reports that he bathes at the sink) Bathroom Toilet: Handicapped height Home Equipment: BSC/3in1;Electric scooter     Mobility Comments: Pt uses scooter to bed, scooter to chair etc... Pt takes electric scooter in community (to store and fast food resturants) ADLs Comments: Pt reports that he is Mod I ADL's, bathes at sink in kitchen - seated.             Frequency  Min 2X/week        Progress Toward Goals  OT Goals(current goals can now be found in the care plan section)  Progress towards OT goals: Not progressing toward goals - comment  Acute Rehab OT Goals Patient Stated Goal: Get more weight/fluid off OT Goal Formulation: With patient Time For Goal Achievement: 05/22/22 Potential to Achieve  Goals: Good  Plan Discharge plan needs to be updated       AM-PAC OT "6 Clicks" Daily Activity     Outcome Measure   Help from another person eating meals?: None Help from another person taking care of personal grooming?: A Little Help from another person toileting, which includes using toliet, bedpan, or urinal?: A Lot Help from another person bathing (including washing, rinsing, drying)?: A Lot Help from another person to put on and taking off regular upper body clothing?: A Little Help from another person to put on and taking off regular lower body clothing?: Total 6 Click Score: 15    End of Session Equipment Utilized During Treatment:  (None)  OT Visit Diagnosis: Other abnormalities of gait and mobility (R26.89);Other (comment) (Deconditioning)   Activity Tolerance Other (comment) (Pt declineid sitting up at EOB and OOB activity this date despite maximal verbal encouragement to participate in transfers in preparation for increased activity tolerance)   Patient Left in bed;with call bell/phone within reach;Other (comment) (Transportation taking pt off floor for testing)   Nurse Communication Other (comment) (Pt declined OOB activity this date)        Time: [id number] OT Time Calculation (min): 20 min  Charges: OT General Charges $OT Visit: 1 Visit OT Treatments $Self Care/Home Management :  8-22 mins   Josephine Igo Dixon, OTR/L 05/09/2022, 10:12 AM

## 2022-05-09 NOTE — Progress Notes (Addendum)
Subjective:  Patient was feeling fine this morning, expressed eagerness to expedite discharge and has been moving his legs throughout the day. Explains that he can fulfill all of his current needs at home. Prefers to receive therapy at home rather than a skilled nursing facility. Discussed plan to continue diuresis for at least one more day.    Objective: Vitals over previous 24hr: Vitals:   05/09/22 0816 05/09/22 0818 05/09/22 0851 05/09/22 1457  BP:   (!) 124/58   Pulse:      Resp:      Temp:      TempSrc:      SpO2: 91% 90%  95%  Weight:      Height:        General:      awake and alert, lying comfortably in bed, cooperative, not in acute distress Lungs:      normal respiratory effort, breathing unlabored, symmetrical chest rise, mild expiratory wheezing, no crackles Cardiac:      regular rate and rhythm, distant S1 and S2, 2+ pitting edema up to thighs bilaterally Abdomen:      soft and non-distended, normoactive bowel sounds, 1+ flank edema Neurologic:      oriented to person-place-time, no gross focal deficits Psychiatric:      euthymic mood with congruent affect, intelligible speech    Assessment/Plan: Andrew Fowler is a 62 year old male with a past medical history of morbid obesity, diabetes, COPD, and rheumatoid arthritis who presented with increased lower extremity swelling and breath shortness, now admitted for treatment of decompensated cirrhosis.    ---Decompensated cirrhosis ---Hyponatremia ---Malnutrition Patient has history of hepatic steatosis and fibrosis identified via biopsy. He presented on 2-9 with lower body swelling and breath shortness. Exam demonstrated marked fluid overload and stigmata of cirrhosis. His weight had increased from 127kg in 02-2022 to 162kg on admission, equivalent to 77lbs. Laboratory testing including Na, Alb, TBili, AST, ALT, and PT consistent with cirrhosis. Intravenous diuresis with furosemide and spironolactone was initiated  upon arrival. Net fluid loss since admission has been 13.9L. Paracentesis performed 2-15 removed 2.7L transudate fluid with neutrophil count 31. Transitioned from intravenous to oral furosemide on 2-16 in preparation for discharge. > Furosemide 9m q24 > Spironolactone 1061mq24 > Diet low sodium > Monitor ins-outs > Trend CMP q24 > Trend CBC q24 > Trend PT-INR q48   ---Generalized debility At baseline, the patient ambulates with motorized wheelchair. He presented with marked lower extremity and abdominal swelling that is limiting his mobility. Occupational and physical therapy teams currently recommending home health sessions upon discharge. Patient would also likely benefit from grab stick. > Grab stick upon discharge   ---Abdominal pain ---Hematemesis Patient developed acute abdominal pain and experienced several episodes of rust-colored emesis on 2-13. Subsequent upper endoscopy negative for esophageal, variceal, or peptic ulcer bleed. Despite appropriate fasting, large amount of food was present in stomach suggesting possible underlying gastroparesis. Abdominal CT notable for cirrhosis, large-volume ascites, and large right pleural effusion. Mild abdominal pain remains present, hematemesis resolved on 2-14 without recurrence. > Pantoprazole 4066m24 > Consider gastric emptying study as outpatient   ---Adrenal insufficiency secondary to chronic steroid use ---Rheumatoid arthritis Patient has history of rheumatoid arthritis managed at home with prednisone 5mg35mily. Reports that he ran out of this medication a while ago. Upon arrival, patient exhibited signs of adrenal insufficiency including hypotension and hyponatremia. Cosyntropin stimulation test consistent with adrenal insufficiency. Prednisone at home dose was started on 2-11. > Prednisone 5mg 57m   ---  Chronic obstructive pulmonary disease Patient presented with worsening dyspnea and productive cough, both of which explained by  decompensated cirrhosis. His COPD is managed at home with tiotropium-olodaterol. Breathing has improved since starting diuresis and SpO2 values remain at or near target range of 88-92%. > Arformoterol 15ug 12 > Umeclidinium 62.5ug q24 > Ipratropium-albuterol 0.5-2.20m q6   ---Urinary retention Patient failed outpatient diuresis and was markedly volume overloaded upon arrival to the ED. He reported decreased urinary frequency and bladder ultrasound demonstrated >9933m Foley catheter was placed, resulting in appropriate urine output. Currently on day four post Foley placement. > Continue Foley catheter > Voiding trial prior to discharge   ---Chronic pain syndrome  Patient has history of chronic hip pain treated with hydrocodone-acetaminophen. Last bowel movement per electronic medical record 2-8. > Hydrocodone-acetaminophen 10-32566m6 PRN   ---Anxiety  Patient has history of anxiety managed at home with alprazolam, which was first prescribed in 2017.  > Alprazolam 0.5mg32m2   ---Diabetes II Patient has history of diabetes unmanaged by medications. Blood glucose has been below 200 since admission. Primary team will continue to monitor, especially given re-initiation of prednisone. > Trend CBG q24     Principal Problem:   Decompensated hepatic cirrhosis (HCC) Active Problems:   Type 2 diabetes mellitus with other specified complication (HCC)   COPD (chronic obstructive pulmonary disease) (HCC)   Obstructive sleep apnea   RA (rheumatoid arthritis) (HCC)   Urinary retention   Fluid overload   Adrenal insufficiency (HCC)   Generalized edema   Hematemesis with nausea   Abdominal pain, epigastric    Prior to Admission Living Arrangement:  Anticipated Discharge Location:  Barriers to Discharge:  Dispo: Anticipated discharge in approximately 1-2 day(s).    Dan [place]well Nickel Internal [facility] PGY-1 Pager [phone] 5pm on weekdays and 1pm on weekends: On Call pager  [phone]

## 2022-05-09 NOTE — Procedures (Signed)
PROCEDURE SUMMARY:  Successful US guided paracentesis from RLQ.  Yielded 2.7L of ascitic fluid.  No immediate complications.  Pt tolerated well.   Specimen was sent for labs.  EBL < 10m  Zairah Arista PA-C 05/09/2022 11:11 AM

## 2022-05-10 DIAGNOSIS — K7290 Hepatic failure, unspecified without coma: Secondary | ICD-10-CM | POA: Diagnosis not present

## 2022-05-10 DIAGNOSIS — K7460 Unspecified cirrhosis of liver: Secondary | ICD-10-CM | POA: Diagnosis not present

## 2022-05-10 LAB — COMPREHENSIVE METABOLIC PANEL
ALT: 83 U/L — ABNORMAL HIGH (ref 0–44)
AST: 128 U/L — ABNORMAL HIGH (ref 15–41)
Albumin: 1.8 g/dL — ABNORMAL LOW (ref 3.5–5.0)
Alkaline Phosphatase: 297 U/L — ABNORMAL HIGH (ref 38–126)
Anion gap: 5 (ref 5–15)
BUN: 22 mg/dL (ref 8–23)
CO2: 31 mmol/L (ref 22–32)
Calcium: 7.9 mg/dL — ABNORMAL LOW (ref 8.9–10.3)
Chloride: 94 mmol/L — ABNORMAL LOW (ref 98–111)
Creatinine, Ser: 1 mg/dL (ref 0.61–1.24)
GFR, Estimated: >60 mL/min (ref >60)
Glucose, Bld: 125 mg/dL — ABNORMAL HIGH (ref 70–99)
Potassium: 3.8 mmol/L (ref 3.5–5.1)
Sodium: 130 mmol/L — ABNORMAL LOW (ref 135–145)
Total Bilirubin: 3.9 mg/dL — ABNORMAL HIGH (ref 0.3–1.2)
Total Protein: 5.9 g/dL — ABNORMAL LOW (ref 6.5–8.1)

## 2022-05-10 LAB — CBC
HCT: 38.3 % — ABNORMAL LOW (ref 39.0–52.0)
Hemoglobin: 13.8 g/dL (ref 13.0–17.0)
MCH: 33.2 pg (ref 26.0–34.0)
MCHC: 36 g/dL (ref 30.0–36.0)
MCV: 92.1 fL (ref 80.0–100.0)
Platelets: 108 10*3/uL — ABNORMAL LOW (ref 150–400)
RBC: 4.16 MIL/uL — ABNORMAL LOW (ref 4.22–5.81)
RDW: 17.2 % — ABNORMAL HIGH (ref 11.5–15.5)
WBC: 12.9 10*3/uL — ABNORMAL HIGH (ref 4.0–10.5)
nRBC: 0 % (ref 0.0–0.2)

## 2022-05-10 LAB — PROTIME-INR
INR: 1.3 — ABNORMAL HIGH (ref 0.8–1.2)
Prothrombin Time: 16.2 seconds — ABNORMAL HIGH (ref 11.4–15.2)

## 2022-05-10 MED ORDER — FUROSEMIDE 40 MG PO TABS: 40.0000 mg | ORAL_TABLET | Freq: Every day | ORAL | 0 refills | Status: DC

## 2022-05-10 MED ORDER — SPIRONOLACTONE 100 MG PO TABS: 100.0000 mg | ORAL_TABLET | Freq: Every day | ORAL | 0 refills | Status: DC

## 2022-05-10 MED ORDER — PREDNISONE 5 MG PO TABS: 5.0000 mg | ORAL_TABLET | Freq: Every day | ORAL | 0 refills | Status: DC

## 2022-05-10 MED ORDER — NICOTINE 14 MG/24HR TD PT24: 14.0000 mg | MEDICATED_PATCH | Freq: Every day | TRANSDERMAL | 0 refills | Status: DC

## 2022-05-10 MED ORDER — PANTOPRAZOLE SODIUM 40 MG PO TBEC: 40.0000 mg | DELAYED_RELEASE_TABLET | Freq: Every day | ORAL | 0 refills | Status: DC

## 2022-05-10 MED ORDER — FUROSEMIDE 40 MG PO TABS
40.0000 mg | ORAL_TABLET | Freq: Every day | ORAL | Status: DC
  Administered 2022-05-10: 40 mg via ORAL
  Filled 2022-05-10: qty 1

## 2022-05-10 NOTE — Telephone Encounter (Signed)
Chart reviewed.  Pt still admitted to Hospital.

## 2022-05-10 NOTE — Telephone Encounter (Signed)
Pt made aware of Providers recommendations: Pt scheduled for follow up on 05/24/2022 at 11:30 to see Dr. Carlean Purl: Pt made aware.  Pt verbalized understanding with all questions answered.

## 2022-05-10 NOTE — Consult Note (Signed)
   [facility] Inpatient Consult   05/10/2022  Andrew Fowler November 25, 1960 [id number]  Managed Medicaid: [employer]  Primary Care Provider:  Lucious Groves, DO [facility]  Met with the patient and brother [both in electric wheelchairs] to explain Managed Medicaid team can follow and assist with any benefits and follow up needs.  Patient declines at this time.   Plan:  No referral needed per patint  Of note, [place] does not replace or interfere with any arrangements made by the Inpatient Transition of Care team.  For questions contact:   Natividad Brood, RN BSN [place]  [phone] business mobile phone Toll free office [phone]  *[place]  [phone] Fax number: [phone] [name]

## 2022-05-10 NOTE — Progress Notes (Signed)
Patient foley catherer d/c at 1205pm, patient refuse to stay in hospital for voiding trial. MD is aware.

## 2022-05-10 NOTE — Discharge Summary (Signed)
Name: Andrew Fowler MRN: [id number] DOB: 18-Mar-1961 62 y.o. PCP: Lucious Groves, DO  Date of Admission: 05/02/2022 12:01 PM Date of Discharge: 05/10/2022 Attending Physician: [name], MD  Discharge Diagnosis: Principal Problem:   Decompensated hepatic cirrhosis ([place]) Active Problems:   Type 2 diabetes mellitus with other specified complication (HCC)   COPD (chronic obstructive pulmonary disease) (HCC)   Obstructive sleep apnea   RA (rheumatoid arthritis) (HCC)   Urinary retention   Fluid overload   Adrenal insufficiency (HCC)   Generalized edema   Hematemesis with nausea   Abdominal pain, epigastric     Discharge Medications: Allergies as of 05/10/2022   No Known Allergies      Medication List     TAKE these medications    Accu-Chek Aviva Plus w/Device Kit Check blood sugar once a day   Accu-Chek Softclix Lancets lancets Check blood sugar once a day   acetic acid 2 % otic solution Place 4 drops into both ears daily as needed.   ALPRAZolam 0.5 MG tablet Commonly known as: XANAX TAKE 1 TABLET BY MOUTH 2 TIMES DAILY AS NEEDED FOR ANXIETY.   atorvastatin 40 MG tablet Commonly known as: LIPITOR TAKE 1 TABLET BY MOUTH EVERY DAY   furosemide 40 MG tablet Commonly known as: LASIX Take 1 tablet (40 mg total) by mouth daily. Start taking on: May 11, 2022 What changed:  medication strength how much to take   glucose blood test strip Commonly known as: Accu-Chek Aviva Plus Check blood sugar once a day   Humira (2 Pen) 40 MG/0.4ML Pnkt Generic drug: Adalimumab Inject 40 mg into the skin every 14 (fourteen) days.   HYDROcodone-acetaminophen 10-325 MG tablet Commonly known as: NORCO Take 1 tablet by mouth every 6 (six) hours as needed. What changed: reasons to take this   hydroxychloroquine 200 MG tablet Commonly known as: PLAQUENIL Take 200 mg by mouth 2 (two) times daily.   nicotine 14 mg/24hr patch Commonly known as: NICODERM CQ -  dosed in mg/24 hours Place 1 patch (14 mg total) onto the skin daily. Start taking on: May 11, 2022   pantoprazole 40 MG tablet Commonly known as: PROTONIX Take 1 tablet (40 mg total) by mouth daily. Start taking on: May 11, 2022   predniSONE 5 MG tablet Commonly known as: DELTASONE Take 1 tablet (5 mg total) by mouth daily with breakfast. Start taking on: May 11, 2022 What changed:  when to take this Another medication with the same name was removed. Continue taking this medication, and follow the directions you see here.   spironolactone 100 MG tablet Commonly known as: ALDACTONE Take 1 tablet (100 mg total) by mouth daily. Start taking on: May 11, 2022   Stiolto Respimat 2.5-2.5 MCG/ACT Aers Generic drug: Tiotropium Bromide-Olodaterol INHALE 2 PUFFS BY MOUTH INTO THE LUNGS DAILY What changed: See the new instructions.   varenicline 0.5 MG tablet Commonly known as: Chantix Take 1 tablet (0.5 mg total) by mouth daily for 3 days, THEN 1 tablet (0.5 mg total) 2 (two) times daily for 4 days, THEN 2 tablets (1 mg total) 2 (two) times daily. Start taking on: March 05, 2022   Ventolin HFA 108 (90 Base) MCG/ACT inhaler Generic drug: albuterol INHALE 1-2 PUFFS INTO THE LUNGS EVERY 4 (FOUR) HOURS AS NEEDED. SHORTNESS OF BREATH What changed: See the new instructions.         Disposition and follow-up:    AndrewAnselmo R Fowler was discharged from [facility] in Stable condition.  At the hospital follow up visit please address:   1.  Check volume status and adherence to current diuretic regimen of furosemide 62m and spironolactone 103mdaily  2.  Ensure patient has been urinating without difficulty given concern for urinary retention during hospitalization  3. Labs / imaging needed at time of follow-up: BMP for Cr and K  4.  Pending labs / tests needing follow-up: gastric emptying study for suspected gastroparesis    Follow-up  Appointments:  Internal Medicine Clinic next week, exact date pending    Hospital Course by problem list:  Mr Fowler a [age]ear old male with a past medical history of morbid obesity, diabetes, COPD, and rheumatoid arthritis who presented with increased lower extremity swelling and breath shortness, now admitted for treatment of decompensated cirrhosis.       ---Decompensated cirrhosis ---Hyponatremia ---Malnutrition Patient has history of hepatic steatosis and fibrosis identified via biopsy. He presented on 2-9 with lower body swelling and breath shortness. Exam demonstrated marked fluid overload and stigmata of cirrhosis. His weight had increased from 127kg in 02-2022 to 162kg on admission, equivalent to 77lbs. Laboratory testing including Na, Alb, TBili, AST, ALT, and PT consistent with cirrhosis. Intravenous diuresis with furosemide and spironolactone was initiated upon arrival. Net fluid loss since admission has been 13.9L. Paracentesis performed 2-15 removed 2.7L transudate fluid with neutrophil count 31. Transitioned from intravenous to oral furosemide on 2-16 in preparation for discharge, dose 4058maily. Also discharged with spironolactone.     ---Generalized debility At baseline, the patient ambulates with motorized wheelchair. He presented with marked lower extremity and abdominal swelling that is limiting his mobility. Occupational and physical therapy teams currently recommending home health sessions upon discharge. Patient would also likely benefit from grab stick.     ---Abdominal pain ---Hematemesis Patient developed acute abdominal pain and experienced several episodes of rust-colored emesis on 2-13. Subsequent upper endoscopy negative for esophageal, variceal, or peptic ulcer bleed. Despite appropriate fasting, large amount of food was present in stomach suggesting possible underlying gastroparesis. Abdominal CT notable for cirrhosis, large-volume ascites, and large right  pleural effusion. Mild abdominal pain remains present, hematemesis resolved on 2-14 without recurrence. Discharged with pantoprazole and recommendation to complete gastric emptying study.     ---Adrenal insufficiency secondary to chronic steroid use ---Rheumatoid arthritis Patient has history of rheumatoid arthritis managed at home with prednisone 5mg64mily. Reports that he ran out of this medication a while ago. Upon arrival, patient exhibited signs of adrenal insufficiency including hypotension and hyponatremia. Cosyntropin stimulation test consistent with adrenal insufficiency. Prednisone at home dose of 5mg 71mly was started on 2-11.     ---Chronic obstructive pulmonary disease Patient presented with worsening dyspnea and productive cough, both of which explained by decompensated cirrhosis. His COPD is managed at home with tiotropium-olodaterol. Breathing has improved since starting diuresis and SpO2 values remained at or near target range of 88-92%.     ---Urinary retention Patient failed outpatient diuresis and was markedly volume overloaded upon arrival to the ED. He reported decreased urinary frequency and bladder ultrasound demonstrated >999mL.46mey catheter was placed, resulting in appropriate urine output. Discontinued on day of discharge.      Discharge Exam:    BP (!) 108/58 (BP Location: Left Arm)   Pulse 81   Temp 98.1 F (36.7 C) (Oral)   Resp 18   Ht 5' 11"$  (1.803 m)   Wt (!) 150.9 kg   SpO2 91%   BMI 46.40  kg/m   Subjective: Patient was feeling fine this morning, expressed eagerness to expedite discharge and has been moving his legs throughout the day. Explains that he can fulfill all of his current needs at home. Prefers to receive therapy at home rather than a skilled nursing facility. Discussed plan to continue diuresis for at least one more day.    General:                       awake and alert, lying comfortably in bed, cooperative, not in acute  distress Lungs:                          normal respiratory effort, breathing unlabored, symmetrical chest rise, mild expiratory wheezing, no crackles Cardiac:                        regular rate and rhythm, distant S1 and S2, 2+ pitting edema up to thighs bilaterally Abdomen:                     soft and non-distended, normoactive bowel sounds, 1+ flank edema Neurologic:                   oriented to person-place-time, no gross focal deficits Psychiatric:                   euthymic mood with congruent affect, intelligible speech     Pertinent Labs, Studies, and Procedures:   Labs:    Latest Ref Rng & Units 05/10/2022   10:40 AM 05/09/2022    7:08 AM 05/08/2022    6:17 AM  CBC  WBC 4.0 - 10.5 K/uL 12.9  11.6  13.0   Hemoglobin 13.0 - 17.0 g/dL 13.8  13.1  13.8   Hematocrit 39.0 - 52.0 % 38.3  36.5  37.7   Platelets 150 - 400 K/uL 108  100  116       Latest Ref Rng & Units 05/10/2022   10:40 AM 05/09/2022    7:08 AM 05/08/2022    6:17 AM  CMP  Glucose 70 - 99 mg/dL 125  124  112   BUN 8 - 23 mg/dL 22  27  27   $ Creatinine 0.61 - 1.24 mg/dL 1.00  1.05  1.00   Sodium 135 - 145 mmol/L 130  131  131   Potassium 3.5 - 5.1 mmol/L 3.8  4.0  4.0   Chloride 98 - 111 mmol/L 94  94  93   CO2 22 - 32 mmol/L 31  23  29   $ Calcium 8.9 - 10.3 mg/dL 7.9  8.1  8.2   Total Protein 6.5 - 8.1 g/dL 5.9  5.5  5.7   Total Bilirubin 0.3 - 1.2 mg/dL 3.9  3.3  4.2   Alkaline Phos 38 - 126 U/L 297  277  307   AST 15 - 41 U/L 128  113  119   ALT 0 - 44 U/L 83  67  73     ______________________  Imaging:  US ABDOMEN LIMITED WITH LIVER DOPPLER Result Date: 05/04/2022 IMPRESSION: 1. Cirrhotic liver morphology without focal hepatic lesion. 2. Mild ascites. 3. Patent portal vein with appropriate direction of flow. 4. Nonvisualization of the spleen and splenic vein. Overall examination is limited due to shadowing bowel gas.  ECHOCARDIOGRAM COMPLETE Result Date: 05/03/2022 FINDINGS  Left Ventricle: Left  ventricular  ejection fraction, by estimation, is 60 to 65%. The left ventricle has normal function. The left ventricle has no regional wall motion abnormalities. The left ventricular internal cavity size was normal in size. There is  no left ventricular hypertrophy. Left ventricular diastolic parameters are indeterminate. Right Ventricle: The right ventricular size is normal. Right vetricular wall thickness was not well visualized. Right ventricular systolic function is normal. Tricuspid regurgitation signal is inadequate for assessing PA pressure. Left Atrium: Left atrial size was normal in size. Right Atrium: Right atrial size was normal in size. Pericardium: Trivial pericardial effusion is present. Mitral Valve: The mitral valve is normal in structure. No evidence of mitral valve regurgitation. Tricuspid Valve: The tricuspid valve is normal in structure. Tricuspid valve regurgitation is trivial. Aortic Valve: The aortic valve was not well visualized. Aortic valve regurgitation is not visualized. No aortic stenosis is present. Aortic valve mean gradient measures 3.0 mmHg. Aortic valve peak gradient measures 5.5 mmHg. Aortic valve area, by VTI measures 4.37 cm. Pulmonic Valve: The pulmonic valve was not well visualized. Pulmonic valve regurgitation is not visualized. Aorta: The aortic root and ascending aorta are structurally normal, with no evidence of dilitation. IAS/Shunts: The interatrial septum was not well visualized.  LEFT VENTRICLE PLAX 2D LVIDd:         5.30 cm   Diastology LVIDs:         3.50 cm   LV e' medial:  14.10 cm/s LV PW:         1.00 cm   LV e' lateral: 10.70 cm/s LV IVS:        0.90 cm LVOT diam:     2.50 cm LV SV:         83 LV SV Index:   31 LVOT Area:     4.91 cm  RIGHT VENTRICLE RV S prime:     16.80 cm/s TAPSE (M-mode): 3.4 cm LEFT ATRIUM           Index        RIGHT ATRIUM           Index LA diam:      3.90 cm 1.44 cm/m   RA Area:     15.70 cm LA Vol (A4C): 28.4 ml 10.52 ml/m  RA  Volume:   35.80 ml  13.26 ml/m  AORTIC VALVE AV Area (Vmax):    4.09 cm AV Area (Vmean):   3.90 cm AV Area (VTI):     4.37 cm AV Vmax:           117.00 cm/s AV Vmean:          82.400 cm/s AV VTI:            0.190 m AV Peak Grad:      5.5 mmHg AV Mean Grad:      3.0 mmHg LVOT Vmax:         97.60 cm/s LVOT Vmean:        65.400 cm/s LVOT VTI:          0.169 m LVOT/AV VTI ratio: 0.89  AORTA Ao Root diam: 3.60 cm Ao Asc diam:  3.40 cm  SHUNTS Systemic VTI:  0.17 m Systemic Diam: 2.50 cm Oswaldo Milian MD Electronically signed by Oswaldo Milian MD Signature Date/Time: 05/03/2022/1:46:35 PM    Final    DG Chest Port 1 View Result Date: 05/02/2022 IMPRESSION: Hypoinflation with mild hazy parahilar opacification right worse than left likely mild vascular congestion and less likely infection.  ______________________  Procedures:  Paracentesis on 2-15  ______________________   Discharge Instructions:  Discharge Instructions     Diet - low sodium heart healthy   Complete by: As directed    Discharge instructions   Complete by: As directed    Mr Voda,  It was a pleasure taking care of you while you were in the hospital. Your abdominal and lower extremity swelling were caused by cirrhosis. We gave you diuretics and carefully reduced the swelling. You lost about 10.2L, or 22.5lbs, during your hospitalization. When you return home, you will continue taking furosemide 4m and spironolactone 104mdaily to help remove the extra fluid. Please also limit your intake of dietary salt.  While you were here, you developed some blood-tinged vomit. We looked inside your stomach, but did not find any obvious signs of bleeding. We suspect that you may have a condition called gastroparesis from diabetes and are recommending a gastric emptying study after you return home.   Please return to the emergency room if your leg swelling worsens or if you notice blood in your vomit again.   Increase  activity slowly   Complete by: As directed          Signed: DaRoswell NickelMD Internal Medicine PGY-1 Pager [phone]

## 2022-05-11 ENCOUNTER — Encounter (HOSPITAL_COMMUNITY): Payer: Self-pay | Admitting: Gastroenterology

## 2022-05-13 LAB — CYTOLOGY - NON PAP

## 2022-05-14 ENCOUNTER — Telehealth: Payer: Self-pay | Admitting: Internal Medicine

## 2022-05-14 ENCOUNTER — Other Ambulatory Visit: Payer: Self-pay | Admitting: *Deleted

## 2022-05-14 DIAGNOSIS — G894 Chronic pain syndrome: Secondary | ICD-10-CM

## 2022-05-14 DIAGNOSIS — F418 Other specified anxiety disorders: Secondary | ICD-10-CM

## 2022-05-14 LAB — CULTURE, BODY FLUID W GRAM STAIN -BOTTLE: Culture: NO GROWTH

## 2022-05-14 NOTE — Telephone Encounter (Signed)
-----   Message from Serita Butcher, MD sent at 05/10/2022 12:06 PM EST ----- Can we schedule this patient for hospital follow-up early next week? Discharging today.  Linna Hoff

## 2022-05-14 NOTE — Telephone Encounter (Signed)
Call placed to patient by FO to schedule HFU. He requested refills on oxycodone and Xanax.

## 2022-05-15 MED ORDER — ALPRAZOLAM 0.5 MG PO TABS: 0.5000 mg | ORAL_TABLET | Freq: Two times a day (BID) | ORAL | 0 refills | Status: DC | PRN

## 2022-05-15 MED ORDER — HYDROCODONE-ACETAMINOPHEN 10-325 MG PO TABS: 1.0000 | ORAL_TABLET | Freq: Four times a day (QID) | ORAL | 0 refills | Status: DC | PRN

## 2022-05-16 ENCOUNTER — Encounter: Payer: Medicaid Other | Admitting: Internal Medicine

## 2022-05-21 DIAGNOSIS — K7460 Unspecified cirrhosis of liver: Secondary | ICD-10-CM | POA: Diagnosis not present

## 2022-05-21 DIAGNOSIS — Z79899 Other long term (current) drug therapy: Secondary | ICD-10-CM | POA: Diagnosis not present

## 2022-05-21 DIAGNOSIS — R0602 Shortness of breath: Secondary | ICD-10-CM | POA: Diagnosis not present

## 2022-05-21 DIAGNOSIS — R7989 Other specified abnormal findings of blood chemistry: Secondary | ICD-10-CM | POA: Diagnosis not present

## 2022-05-21 DIAGNOSIS — K7290 Hepatic failure, unspecified without coma: Secondary | ICD-10-CM | POA: Diagnosis not present

## 2022-05-21 DIAGNOSIS — R601 Generalized edema: Secondary | ICD-10-CM | POA: Diagnosis not present

## 2022-05-21 DIAGNOSIS — M0579 Rheumatoid arthritis with rheumatoid factor of multiple sites without organ or systems involvement: Secondary | ICD-10-CM | POA: Diagnosis not present

## 2022-05-23 ENCOUNTER — Encounter: Payer: Self-pay | Admitting: Internal Medicine

## 2022-05-23 ENCOUNTER — Ambulatory Visit (INDEPENDENT_AMBULATORY_CARE_PROVIDER_SITE_OTHER): Payer: Medicaid Other | Admitting: Internal Medicine

## 2022-05-23 ENCOUNTER — Other Ambulatory Visit: Payer: Self-pay

## 2022-05-23 VITALS — BP 121/63 | HR 95 | Temp 97.7°F | Ht 71.0 in | Wt 331.1 lb

## 2022-05-23 DIAGNOSIS — E8779 Other fluid overload: Secondary | ICD-10-CM | POA: Diagnosis not present

## 2022-05-23 DIAGNOSIS — E2740 Unspecified adrenocortical insufficiency: Secondary | ICD-10-CM

## 2022-05-23 DIAGNOSIS — K7290 Hepatic failure, unspecified without coma: Secondary | ICD-10-CM

## 2022-05-23 DIAGNOSIS — K7460 Unspecified cirrhosis of liver: Secondary | ICD-10-CM

## 2022-05-23 DIAGNOSIS — M06019 Rheumatoid arthritis without rheumatoid factor, unspecified shoulder: Secondary | ICD-10-CM | POA: Diagnosis not present

## 2022-05-23 MED ORDER — SPIRONOLACTONE 100 MG PO TABS: 200.0000 mg | ORAL_TABLET | Freq: Every day | ORAL | 5 refills | Status: DC

## 2022-05-23 MED ORDER — FUROSEMIDE 40 MG PO TABS: 40.0000 mg | ORAL_TABLET | Freq: Two times a day (BID) | ORAL | 5 refills | Status: DC

## 2022-05-23 NOTE — Assessment & Plan Note (Signed)
He certainly still hypervolemic appears his 100/40 diuretic dose is just keeping him from gaining additional fluid but not causing additional diuresis.  I will increase him to 200 mg of spironolactone and increase his furosemide frequency to twice daily to maintain the ratio.  Check a CMP today.  Asked him to reschedule his follow-up with his gastroenterology hopefully next week

## 2022-05-23 NOTE — Progress Notes (Signed)
Established Patient Office Visit  Subjective   Patient ID: Andrew Fowler, male    DOB: [birth year]  Age: 62 y.o. MRN: [id number]  Chief Complaint  Patient presents with   HFU Visit    States feels like increase fluid in his chest.   At our last visit I had him get direct admitted to the hospital for hypervolemia secondary to cirrhosis he was diuresed from 357 to about 332 and discharged on 216.  He followed up 2 days ago with new rheumatologist at [facility] and get refills of his prednisone and hydroxychloroquine.  He remains hypervolemic becomes easily fatigued with exertion and continues to have lower extremity edema as well as a protuberant belly his rheumatologist suggest that he may need to get readmitted but he held off until his visit today.  His weight is down to 331 1 pound lower than his discharge weight he has been adherent to spironolactone 100 mg daily and furosemide 40 mg daily.  He was supposed to have follow-up with gastroenterology tomorrow but canceled it in case he needed to get admitted/arranging transportation.    Objective:     BP 121/63 (BP Location: Right Arm, Patient Position: Sitting, Cuff Size: Normal)   Pulse 95   Temp 97.7 F (36.5 C) (Oral)   Ht '5\' 11"'$  (1.803 m)   Wt (!) 331 lb 1.6 oz (150.2 kg)   SpO2 97% Comment: RA  BMI 46.18 kg/m  BP Readings from Last 3 Encounters:  05/23/22 121/63  05/10/22 (!) 108/58  05/02/22 (!) 102/53   Wt Readings from Last 3 Encounters:  05/23/22 (!) 331 lb 1.6 oz (150.2 kg)  05/10/22 (!) 332 lb 10.8 oz (150.9 kg)  05/02/22 (!) 357 lb 3.2 oz (162 kg)      Physical Exam Constitutional:      Appearance: Normal appearance.     Comments: In [place]  Eyes:     General: Scleral icterus present.  Cardiovascular:     Rate and Rhythm: Normal rate and regular rhythm.  Pulmonary:     Effort: Pulmonary effort is normal.  Abdominal:     General: Abdomen is flat. There is distension.  Musculoskeletal:     Right lower leg:  Edema present.     Left lower leg: Edema present.  Skin:    Coloration: Skin is not jaundiced.  Neurological:     General: No focal deficit present.     Mental Status: He is alert and oriented to person, place, and time.      No results found for any visits on 05/23/22.  Last CBC Lab Results  Component Value Date   WBC 12.9 (H) 05/10/2022   HGB 13.8 05/10/2022   HCT 38.3 (L) 05/10/2022   MCV 92.1 05/10/2022   MCH 33.2 05/10/2022   RDW 17.2 (H) 05/10/2022   PLT 108 (L) [id number]   Last metabolic panel Lab Results  Component Value Date   GLUCOSE 125 (H) 05/10/2022   NA 130 (L) 05/10/2022   K 3.8 05/10/2022   CL 94 (L) 05/10/2022   CO2 31 05/10/2022   BUN 22 05/10/2022   CREATININE 1.00 05/10/2022   GFRNONAA >60 05/10/2022   CALCIUM 7.9 (L) 05/10/2022   PHOS 3.6 06/25/2010   PROT 5.9 (L) 05/10/2022   ALBUMIN 1.8 (L) 05/10/2022   LABGLOB 3.8 03/05/2022   AGRATIO 0.7 (L) 03/05/2022   BILITOT 3.9 (H) 05/10/2022   ALKPHOS 297 (H) 05/10/2022   AST 128 (H) 05/10/2022  ALT 83 (H) 05/10/2022   ANIONGAP 5 05/10/2022   Last lipids Lab Results  Component Value Date   CHOL 159 07/09/2021   HDL 62 07/09/2021   LDLCALC 73 07/09/2021   TRIG 137 07/09/2021   CHOLHDL 2.6 07/09/2021      The 10-year ASCVD risk score (Arnett DK, et al., 2019) is: 19.9%    Assessment & Plan:   Problem List Items Addressed This Visit       Digestive   Decompensated hepatic cirrhosis ([place]) - Primary    He certainly still hypervolemic appears his 100/40 diuretic dose is just keeping him from gaining additional fluid but not causing additional diuresis.  I will increase him to 200 mg of spironolactone and increase his furosemide frequency to twice daily to maintain the ratio.  Check a CMP today.  Asked him to reschedule his follow-up with his gastroenterology hopefully next week      Relevant Orders   CMP14 + Anion Gap     Endocrine   Adrenal insufficiency ([place])    He is taking  prednisone 5 mg daily        Musculoskeletal and Integument   RA (rheumatoid arthritis) ([place]) (Chronic)    Has now established with [facility] rheumatology.        Other   Fluid overload    Return in about 4 weeks (around 06/20/2022).    Lucious Groves, DO

## 2022-05-23 NOTE — Assessment & Plan Note (Signed)
He is taking prednisone 5 mg daily

## 2022-05-23 NOTE — Assessment & Plan Note (Signed)
Has now established with [facility] rheumatology.

## 2022-05-23 NOTE — Patient Instructions (Signed)
I am increasing your fluid pills.  I now want you to take 2 tablets ('200mg'$ ) of spironolactone each day.  I also want you to take one '40mg'$  tablet of furosemide twice a day.  I want you to let me know if your weight is increasing.

## 2022-05-24 ENCOUNTER — Ambulatory Visit: Payer: Medicaid Other | Admitting: Internal Medicine

## 2022-05-25 LAB — CMP14 + ANION GAP
ALT: 90 IU/L — ABNORMAL HIGH (ref 0–44)
AST: 106 IU/L — ABNORMAL HIGH (ref 0–40)
Albumin/Globulin Ratio: 0.7 — ABNORMAL LOW (ref 1.2–2.2)
Albumin: 2.5 g/dL — ABNORMAL LOW (ref 3.9–4.9)
Alkaline Phosphatase: 572 IU/L — ABNORMAL HIGH (ref 44–121)
Anion Gap: 14 mmol/L (ref 10.0–18.0)
BUN/Creatinine Ratio: 17 (ref 10–24)
BUN: 18 mg/dL (ref 8–27)
Bilirubin Total: 4.4 mg/dL — ABNORMAL HIGH (ref 0.0–1.2)
CO2: 20 mmol/L (ref 20–29)
Calcium: 8.3 mg/dL — ABNORMAL LOW (ref 8.6–10.2)
Chloride: 96 mmol/L (ref 96–106)
Creatinine, Ser: 1.05 mg/dL (ref 0.76–1.27)
Globulin, Total: 3.8 g/dL (ref 1.5–4.5)
Glucose: 122 mg/dL — ABNORMAL HIGH (ref 70–99)
Potassium: 4.2 mmol/L (ref 3.5–5.2)
Sodium: 130 mmol/L — ABNORMAL LOW (ref 134–144)
Total Protein: 6.3 g/dL (ref 6.0–8.5)
eGFR: 81 mL/min/{1.73_m2} (ref >59)

## 2022-05-30 ENCOUNTER — Emergency Department (HOSPITAL_COMMUNITY): Payer: Medicaid Other

## 2022-05-30 ENCOUNTER — Encounter (HOSPITAL_COMMUNITY): Payer: Self-pay

## 2022-05-30 ENCOUNTER — Inpatient Hospital Stay (HOSPITAL_COMMUNITY)
Admission: EM | Admit: 2022-05-30 | Discharge: 2022-06-24 | DRG: 870 | Disposition: E | Payer: Medicaid Other | Attending: Pulmonary Disease | Admitting: Pulmonary Disease

## 2022-05-30 ENCOUNTER — Other Ambulatory Visit: Payer: Self-pay

## 2022-05-30 DIAGNOSIS — I10 Essential (primary) hypertension: Secondary | ICD-10-CM | POA: Diagnosis present

## 2022-05-30 DIAGNOSIS — E785 Hyperlipidemia, unspecified: Secondary | ICD-10-CM | POA: Diagnosis present

## 2022-05-30 DIAGNOSIS — T380X5A Adverse effect of glucocorticoids and synthetic analogues, initial encounter: Secondary | ICD-10-CM | POA: Diagnosis present

## 2022-05-30 DIAGNOSIS — J80 Acute respiratory distress syndrome: Secondary | ICD-10-CM | POA: Diagnosis not present

## 2022-05-30 DIAGNOSIS — A419 Sepsis, unspecified organism: Principal | ICD-10-CM | POA: Diagnosis present

## 2022-05-30 DIAGNOSIS — Z91199 Patient's noncompliance with other medical treatment and regimen due to unspecified reason: Secondary | ICD-10-CM

## 2022-05-30 DIAGNOSIS — E1165 Type 2 diabetes mellitus with hyperglycemia: Secondary | ICD-10-CM | POA: Diagnosis present

## 2022-05-30 DIAGNOSIS — Z1152 Encounter for screening for COVID-19: Secondary | ICD-10-CM

## 2022-05-30 DIAGNOSIS — E860 Dehydration: Secondary | ICD-10-CM | POA: Diagnosis present

## 2022-05-30 DIAGNOSIS — R339 Retention of urine, unspecified: Secondary | ICD-10-CM | POA: Diagnosis present

## 2022-05-30 DIAGNOSIS — G9340 Encephalopathy, unspecified: Secondary | ICD-10-CM | POA: Diagnosis not present

## 2022-05-30 DIAGNOSIS — D696 Thrombocytopenia, unspecified: Secondary | ICD-10-CM | POA: Diagnosis present

## 2022-05-30 DIAGNOSIS — D649 Anemia, unspecified: Secondary | ICD-10-CM | POA: Diagnosis present

## 2022-05-30 DIAGNOSIS — Z66 Do not resuscitate: Secondary | ICD-10-CM | POA: Diagnosis not present

## 2022-05-30 DIAGNOSIS — R000 Tachycardia, unspecified: Secondary | ICD-10-CM | POA: Diagnosis not present

## 2022-05-30 DIAGNOSIS — Z6841 Body Mass Index (BMI) 40.0 and over, adult: Secondary | ICD-10-CM

## 2022-05-30 DIAGNOSIS — K3184 Gastroparesis: Secondary | ICD-10-CM | POA: Diagnosis present

## 2022-05-30 DIAGNOSIS — R579 Shock, unspecified: Secondary | ICD-10-CM | POA: Diagnosis not present

## 2022-05-30 DIAGNOSIS — E8770 Fluid overload, unspecified: Secondary | ICD-10-CM | POA: Diagnosis present

## 2022-05-30 DIAGNOSIS — Z993 Dependence on wheelchair: Secondary | ICD-10-CM

## 2022-05-30 DIAGNOSIS — Z809 Family history of malignant neoplasm, unspecified: Secondary | ICD-10-CM

## 2022-05-30 DIAGNOSIS — J440 Chronic obstructive pulmonary disease with acute lower respiratory infection: Secondary | ICD-10-CM | POA: Diagnosis present

## 2022-05-30 DIAGNOSIS — N179 Acute kidney failure, unspecified: Secondary | ICD-10-CM | POA: Diagnosis present

## 2022-05-30 DIAGNOSIS — E871 Hypo-osmolality and hyponatremia: Secondary | ICD-10-CM | POA: Diagnosis present

## 2022-05-30 DIAGNOSIS — Z7984 Long term (current) use of oral hypoglycemic drugs: Secondary | ICD-10-CM

## 2022-05-30 DIAGNOSIS — Z8249 Family history of ischemic heart disease and other diseases of the circulatory system: Secondary | ICD-10-CM

## 2022-05-30 DIAGNOSIS — E43 Unspecified severe protein-calorie malnutrition: Secondary | ICD-10-CM | POA: Diagnosis present

## 2022-05-30 DIAGNOSIS — M069 Rheumatoid arthritis, unspecified: Secondary | ICD-10-CM | POA: Diagnosis present

## 2022-05-30 DIAGNOSIS — E875 Hyperkalemia: Secondary | ICD-10-CM | POA: Diagnosis not present

## 2022-05-30 DIAGNOSIS — R601 Generalized edema: Principal | ICD-10-CM

## 2022-05-30 DIAGNOSIS — F418 Other specified anxiety disorders: Secondary | ICD-10-CM | POA: Diagnosis present

## 2022-05-30 DIAGNOSIS — R6521 Severe sepsis with septic shock: Secondary | ICD-10-CM | POA: Diagnosis present

## 2022-05-30 DIAGNOSIS — Z79899 Other long term (current) drug therapy: Secondary | ICD-10-CM

## 2022-05-30 DIAGNOSIS — E8809 Other disorders of plasma-protein metabolism, not elsewhere classified: Secondary | ICD-10-CM | POA: Diagnosis present

## 2022-05-30 DIAGNOSIS — Z96642 Presence of left artificial hip joint: Secondary | ICD-10-CM | POA: Diagnosis present

## 2022-05-30 DIAGNOSIS — K7581 Nonalcoholic steatohepatitis (NASH): Secondary | ICD-10-CM | POA: Diagnosis present

## 2022-05-30 DIAGNOSIS — K7200 Acute and subacute hepatic failure without coma: Secondary | ICD-10-CM | POA: Diagnosis present

## 2022-05-30 DIAGNOSIS — Z515 Encounter for palliative care: Secondary | ICD-10-CM

## 2022-05-30 DIAGNOSIS — M109 Gout, unspecified: Secondary | ICD-10-CM | POA: Diagnosis present

## 2022-05-30 DIAGNOSIS — J449 Chronic obstructive pulmonary disease, unspecified: Secondary | ICD-10-CM | POA: Diagnosis present

## 2022-05-30 DIAGNOSIS — J9621 Acute and chronic respiratory failure with hypoxia: Secondary | ICD-10-CM | POA: Diagnosis present

## 2022-05-30 DIAGNOSIS — Z79620 Long term (current) use of immunosuppressive biologic: Secondary | ICD-10-CM

## 2022-05-30 DIAGNOSIS — G4733 Obstructive sleep apnea (adult) (pediatric): Secondary | ICD-10-CM | POA: Diagnosis present

## 2022-05-30 DIAGNOSIS — J811 Chronic pulmonary edema: Secondary | ICD-10-CM | POA: Diagnosis present

## 2022-05-30 DIAGNOSIS — R34 Anuria and oliguria: Secondary | ICD-10-CM | POA: Diagnosis present

## 2022-05-30 DIAGNOSIS — Z841 Family history of disorders of kidney and ureter: Secondary | ICD-10-CM

## 2022-05-30 DIAGNOSIS — J9601 Acute respiratory failure with hypoxia: Secondary | ICD-10-CM | POA: Insufficient documentation

## 2022-05-30 DIAGNOSIS — B9729 Other coronavirus as the cause of diseases classified elsewhere: Secondary | ICD-10-CM | POA: Diagnosis present

## 2022-05-30 DIAGNOSIS — J189 Pneumonia, unspecified organism: Secondary | ICD-10-CM | POA: Diagnosis present

## 2022-05-30 DIAGNOSIS — R451 Restlessness and agitation: Secondary | ICD-10-CM | POA: Diagnosis not present

## 2022-05-30 DIAGNOSIS — R062 Wheezing: Secondary | ICD-10-CM | POA: Diagnosis not present

## 2022-05-30 DIAGNOSIS — G894 Chronic pain syndrome: Secondary | ICD-10-CM | POA: Diagnosis present

## 2022-05-30 DIAGNOSIS — K7460 Unspecified cirrhosis of liver: Secondary | ICD-10-CM | POA: Diagnosis present

## 2022-05-30 DIAGNOSIS — J9622 Acute and chronic respiratory failure with hypercapnia: Secondary | ICD-10-CM | POA: Diagnosis present

## 2022-05-30 DIAGNOSIS — K767 Hepatorenal syndrome: Secondary | ICD-10-CM | POA: Diagnosis present

## 2022-05-30 DIAGNOSIS — J441 Chronic obstructive pulmonary disease with (acute) exacerbation: Secondary | ICD-10-CM | POA: Diagnosis present

## 2022-05-30 DIAGNOSIS — Z781 Physical restraint status: Secondary | ICD-10-CM

## 2022-05-30 DIAGNOSIS — E273 Drug-induced adrenocortical insufficiency: Secondary | ICD-10-CM | POA: Diagnosis present

## 2022-05-30 DIAGNOSIS — F17210 Nicotine dependence, cigarettes, uncomplicated: Secondary | ICD-10-CM | POA: Diagnosis present

## 2022-05-30 DIAGNOSIS — R188 Other ascites: Secondary | ICD-10-CM | POA: Diagnosis present

## 2022-05-30 DIAGNOSIS — E1143 Type 2 diabetes mellitus with diabetic autonomic (poly)neuropathy: Secondary | ICD-10-CM | POA: Diagnosis present

## 2022-05-30 DIAGNOSIS — Z833 Family history of diabetes mellitus: Secondary | ICD-10-CM

## 2022-05-30 DIAGNOSIS — Z7951 Long term (current) use of inhaled steroids: Secondary | ICD-10-CM

## 2022-05-30 DIAGNOSIS — R0902 Hypoxemia: Secondary | ICD-10-CM | POA: Diagnosis not present

## 2022-05-30 DIAGNOSIS — R0602 Shortness of breath: Secondary | ICD-10-CM | POA: Diagnosis not present

## 2022-05-30 DIAGNOSIS — Z7952 Long term (current) use of systemic steroids: Secondary | ICD-10-CM

## 2022-05-30 DIAGNOSIS — I872 Venous insufficiency (chronic) (peripheral): Secondary | ICD-10-CM | POA: Diagnosis present

## 2022-05-30 DIAGNOSIS — K7290 Hepatic failure, unspecified without coma: Secondary | ICD-10-CM | POA: Diagnosis present

## 2022-05-30 DIAGNOSIS — K56600 Partial intestinal obstruction, unspecified as to cause: Secondary | ICD-10-CM | POA: Diagnosis not present

## 2022-05-30 LAB — CBC WITH DIFFERENTIAL/PLATELET
Abs Immature Granulocytes: 0.05 10*3/uL (ref 0.00–0.07)
Basophils Absolute: 0 10*3/uL (ref 0.0–0.1)
Basophils Relative: 0 %
Eosinophils Absolute: 0 10*3/uL (ref 0.0–0.5)
Eosinophils Relative: 0 %
HCT: 35.3 % — ABNORMAL LOW (ref 39.0–52.0)
Hemoglobin: 12.4 g/dL — ABNORMAL LOW (ref 13.0–17.0)
Immature Granulocytes: 0 %
Lymphocytes Relative: 9 %
Lymphs Abs: 1.1 10*3/uL (ref 0.7–4.0)
MCH: 32.7 pg (ref 26.0–34.0)
MCHC: 35.1 g/dL (ref 30.0–36.0)
MCV: 93.1 fL (ref 80.0–100.0)
Monocytes Absolute: 0.9 10*3/uL (ref 0.1–1.0)
Monocytes Relative: 7 %
Neutro Abs: 10.4 10*3/uL — ABNORMAL HIGH (ref 1.7–7.7)
Neutrophils Relative %: 84 %
Platelets: 104 10*3/uL — ABNORMAL LOW (ref 150–400)
RBC: 3.79 MIL/uL — ABNORMAL LOW (ref 4.22–5.81)
RDW: 18.6 % — ABNORMAL HIGH (ref 11.5–15.5)
WBC: 12.5 10*3/uL — ABNORMAL HIGH (ref 4.0–10.5)
nRBC: 0 % (ref 0.0–0.2)

## 2022-05-30 LAB — BASIC METABOLIC PANEL
Anion gap: 10 (ref 5–15)
BUN: 41 mg/dL — ABNORMAL HIGH (ref 8–23)
CO2: 20 mmol/L — ABNORMAL LOW (ref 22–32)
Calcium: 8 mg/dL — ABNORMAL LOW (ref 8.9–10.3)
Chloride: 94 mmol/L — ABNORMAL LOW (ref 98–111)
Creatinine, Ser: 2.24 mg/dL — ABNORMAL HIGH (ref 0.61–1.24)
GFR, Estimated: 33 mL/min — ABNORMAL LOW (ref >60)
Glucose, Bld: 153 mg/dL — ABNORMAL HIGH (ref 70–99)
Potassium: 4.9 mmol/L (ref 3.5–5.1)
Sodium: 124 mmol/L — ABNORMAL LOW (ref 135–145)

## 2022-05-30 LAB — HEPATIC FUNCTION PANEL
ALT: 72 U/L — ABNORMAL HIGH (ref 0–44)
AST: 99 U/L — ABNORMAL HIGH (ref 15–41)
Albumin: 1.7 g/dL — ABNORMAL LOW (ref 3.5–5.0)
Alkaline Phosphatase: 449 U/L — ABNORMAL HIGH (ref 38–126)
Bilirubin, Direct: 2.6 mg/dL — ABNORMAL HIGH (ref 0.0–0.2)
Indirect Bilirubin: 3 mg/dL — ABNORMAL HIGH (ref 0.3–0.9)
Total Bilirubin: 5.6 mg/dL — ABNORMAL HIGH (ref 0.3–1.2)
Total Protein: 5.5 g/dL — ABNORMAL LOW (ref 6.5–8.1)

## 2022-05-30 LAB — PROTIME-INR
INR: 1.2 (ref 0.8–1.2)
Prothrombin Time: 15.3 seconds — ABNORMAL HIGH (ref 11.4–15.2)

## 2022-05-30 LAB — LIPASE, BLOOD: Lipase: 58 U/L — ABNORMAL HIGH (ref 11–51)

## 2022-05-30 LAB — LACTIC ACID, PLASMA: Lactic Acid, Venous: 4.2 mmol/L (ref 0.5–1.9)

## 2022-05-30 LAB — BRAIN NATRIURETIC PEPTIDE: B Natriuretic Peptide: 56.4 pg/mL (ref 0.0–100.0)

## 2022-05-30 LAB — TROPONIN I (HIGH SENSITIVITY): Troponin I (High Sensitivity): 17 ng/L (ref <18)

## 2022-05-30 MED ORDER — FUROSEMIDE 10 MG/ML IJ SOLN
40.0000 mg | Freq: Once | INTRAMUSCULAR | Status: AC
  Administered 2022-05-30: 40 mg via INTRAVENOUS
  Filled 2022-05-30: qty 4

## 2022-05-30 NOTE — ED Provider Notes (Signed)
[facility] Provider Note   CSN: [id number] Arrival date & time: 05/30/22  2148     History  Chief Complaint  Patient presents with   Shortness of Breath    Andrew Fowler is a 62 y.o. male.  Pt is a 62 yo male with pmhx significant for DM2, COPD, OSA, RA, morbid obesity, and cirrhosis.  Pt was admitted from 2/8-2/16 for decompensated cirrhosis.  He had gained 77 lbs between December to Feb.  He diuresed 13.9 L with lasix and spironolactone.  Paracentesis performed and 2.7L fluid removed there.  Pt did f/u with his pcp on 2/29 and he increased his spironolactone was increased and furosemide frequency was increased to twice a day.  Pt felt his breathing was worse today.  He was given duoneb times 2 and solumedrol.  Pt feels like he has a lot of fluid on him today.           Home Medications Prior to Admission medications   Medication Sig Start Date End Date Taking? Authorizing Provider  albuterol (VENTOLIN HFA) 108 (90 Base) MCG/ACT inhaler INHALE 1-2 PUFFS INTO THE LUNGS EVERY 4 (FOUR) HOURS AS NEEDED. SHORTNESS OF BREATH Patient taking differently: Inhale 2 puffs into the lungs every 4 (four) hours as needed for wheezing or shortness of breath. 12/31/21  Yes [name], MD  ALPRAZolam (XANAX) 0.5 MG tablet Take 1 tablet (0.5 mg total) by mouth 2 (two) times daily as needed for anxiety. 05/15/22  Yes Lucious Groves, DO  atorvastatin (LIPITOR) 40 MG tablet TAKE 1 TABLET BY MOUTH EVERY DAY Patient taking differently: Take 40 mg by mouth daily. 08/28/21  Yes [name], MD  furosemide (LASIX) 40 MG tablet Take 1 tablet (40 mg total) by mouth 2 (two) times daily. 05/23/22  Yes Lucious Groves, DO  HYDROcodone-acetaminophen (NORCO) 10-325 MG tablet Take 1 tablet by mouth every 6 (six) hours as needed. 05/15/22  Yes Lucious Groves, DO  nicotine (NICODERM CQ - DOSED IN MG/24 HOURS) 14 mg/24hr patch Place 1 patch (14 mg total) onto  the skin daily. 05/11/22  Yes Serita Butcher, MD  predniSONE (DELTASONE) 5 MG tablet Take 1 tablet (5 mg total) by mouth daily with breakfast. 05/11/22  Yes Serita Butcher, MD  spironolactone (ALDACTONE) 100 MG tablet Take 2 tablets (200 mg total) by mouth daily. Patient taking differently: Take 100 mg by mouth daily. 05/23/22  Yes Lucious Groves, DO  Tiotropium Bromide-Olodaterol (STIOLTO RESPIMAT) 2.5-2.5 MCG/ACT AERS INHALE 2 PUFFS BY MOUTH INTO THE LUNGS DAILY Patient taking differently: Inhale 2 each into the lungs daily. 04/01/22  Yes Lucious Groves, DO  ACCU-CHEK SOFTCLIX LANCETS lancets Check blood sugar once a day 12/25/17   Bartholomew Crews, MD  acetic acid 2 % otic solution Place 4 drops into both ears daily as needed. Patient not taking: Reported on 05/30/2022 03/05/22   Farrel Gordon, DO  Adalimumab (HUMIRA PEN) 40 MG/0.4ML PNKT Inject 40 mg into the skin every 14 (fourteen) days. Patient not taking: Reported on 05/30/2022    [provider]  Blood Glucose Monitoring Suppl (ACCU-CHEK AVIVA PLUS) w/Device KIT Check blood sugar once a day 04/19/16   Bartholomew Crews, MD  glucose blood (ACCU-CHEK AVIVA PLUS) test strip Check blood sugar once a day 12/25/17   Bartholomew Crews, MD  hydroxychloroquine (PLAQUENIL) 200 MG tablet Take 200 mg by mouth 2 (two) times daily. Patient not taking: Reported on 05/30/2022  10/17/20   [provider]  pantoprazole (PROTONIX) 40 MG tablet Take 1 tablet (40 mg total) by mouth daily. 05/11/22   Serita Butcher, MD      Allergies    Patient has no known allergies.    Review of Systems   Review of Systems  Respiratory:  Positive for shortness of breath.   All other systems reviewed and are negative.   Physical Exam Updated Vital Signs BP (!) 103/33   Pulse (!) 109   Resp 18   Wt (!) 155.5 kg   SpO2 95%   BMI 47.81 kg/m  Physical Exam Vitals and nursing note reviewed.  Constitutional:      Appearance: He is well-developed.   HENT:     Head: Normocephalic and atraumatic.     Mouth/Throat:     Mouth: Mucous membranes are moist.     Pharynx: Oropharynx is clear.  Eyes:     Extraocular Movements: Extraocular movements intact.     Pupils: Pupils are equal, round, and reactive to light.  Cardiovascular:     Rate and Rhythm: Regular rhythm. Tachycardia present.  Pulmonary:     Effort: Tachypnea present.     Breath sounds: Rhonchi present.  Abdominal:     General: Bowel sounds are normal.     Palpations: Abdomen is soft.  Musculoskeletal:     Cervical back: Normal range of motion and neck supple.  Skin:    General: Skin is warm.     Capillary Refill: Capillary refill takes less than 2 seconds.  Neurological:     General: No focal deficit present.     Mental Status: He is alert and oriented to person, place, and time.  Psychiatric:        Mood and Affect: Mood normal.        Behavior: Behavior normal.     ED Results / Procedures / Treatments   Labs (all labs ordered are listed, but only abnormal results are displayed) Labs Reviewed  BASIC METABOLIC PANEL - Abnormal; Notable for the following components:      Result Value   Sodium 124 (*)    Chloride 94 (*)    CO2 20 (*)    Glucose, Bld 153 (*)    BUN 41 (*)    Creatinine, Ser 2.24 (*)    Calcium 8.0 (*)    GFR, Estimated 33 (*)    All other components within normal limits  CBC WITH DIFFERENTIAL/PLATELET - Abnormal; Notable for the following components:   WBC 12.5 (*)    RBC 3.79 (*)    Hemoglobin 12.4 (*)    HCT 35.3 (*)    RDW 18.6 (*)    Platelets 104 (*)    Neutro Abs 10.4 (*)    All other components within normal limits  LIPASE, BLOOD - Abnormal; Notable for the following components:   Lipase 58 (*)    All other components within normal limits  LACTIC ACID, PLASMA - Abnormal; Notable for the following components:   Lactic Acid, Venous 4.2 (*)    All other components within normal limits  HEPATIC FUNCTION PANEL - Abnormal; Notable  for the following components:   Total Protein 5.5 (*)    Albumin 1.7 (*)    AST 99 (*)    ALT 72 (*)    Alkaline Phosphatase 449 (*)    Total Bilirubin 5.6 (*)    Bilirubin, Direct 2.6 (*)    Indirect Bilirubin 3.0 (*)    All  other components within normal limits  BRAIN NATRIURETIC PEPTIDE  LACTIC ACID, PLASMA  PROTIME-INR  AMMONIA  TROPONIN I (HIGH SENSITIVITY)  TROPONIN I (HIGH SENSITIVITY)    EKG None  Radiology DG Chest Port 1 View  Result Date: 05/30/2022 CLINICAL DATA:  Shortness of breath EXAM: PORTABLE CHEST 1 VIEW COMPARISON:  05/02/2022 FINDINGS: Elevation of the right hemidiaphragm. Heart is mildly large. Mediastinal contours within normal limits. Continued perihilar opacities, right greater than left, similar prior study. No effusions or acute bony abnormality. IMPRESSION: Perihilar opacities, right greater than left again noted, similar to prior study. Findings could reflect edema or infection. Electronically Signed   By: Rolm Baptise M.D.   On: 05/30/2022 22:07    Procedures Procedures    Medications Ordered in ED Medications  furosemide (LASIX) injection 40 mg (40 mg Intravenous Given 05/30/22 2213)    ED Course/ Medical Decision Making/ A&P                             Medical Decision Making Amount and/or Complexity of Data Reviewed Labs: ordered. Radiology: ordered.  Risk Prescription drug management. Decision regarding hospitalization.   This patient presents to the ED for concern of sob, this involves an extensive number of treatment options, and is a complaint that carries with it a high risk of complications and morbidity.  The differential diagnosis includes copd exac, chf, pna, covid/flu/rsv   Co morbidities that complicate the patient evaluation  DM2, COPD, OSA, RA, morbid obesity, and cirrhosis   Additional history obtained:  Additional history obtained from epic chart review External records from outside source obtained and reviewed  including EMS report   Lab Tests:  I Ordered, and personally interpreted labs.  The pertinent results include:  lactic 4.2, cbc with wbc 12.5, hgb 12.4, plt 104 (all chronic); bmp with na 124, bun 41 and cr 2.24 (na 130 on 2/29 and bun 18/cr 1.05 on 2/29); lfts chronically high   Imaging Studies ordered:  I ordered imaging studies including CXR  I independently visualized and interpreted imaging which showed  Perihilar opacities, right greater than left again noted, similar to  prior study. Findings could reflect edema or infection.   I agree with the radiologist interpretation   Cardiac Monitoring:  The patient was maintained on a cardiac monitor.  I personally viewed and interpreted the cardiac monitored which showed an underlying rhythm of: st   Medicines ordered and prescription drug management:  I ordered medication including lasix  for sob  Reevaluation of the patient after these medicines showed that the patient improved I have reviewed the patients home medicines and have made adjustments as needed  Critical Interventions:  lasix   Consultations Obtained:  I requested consultation with the IMTS,  and discussed lab and imaging findings as well as pertinent plan - they will admit   Problem List / ED Course:  Fluid overload:  pt is now 155.5 kg.  He was 150.2 kg on 2/29 and 150.9 kg at d/c on 2/16.  So, 11.7 lbs since 2/29. Lasix given.  Pt is fluid overloaded, but the pt is also intravascularly depleted.  He has an aki and his albumin is only 1.7. AKI:  likely from increasing diuretics.   Elevated lactic: likely from dehydration   Reevaluation:  After the interventions noted above, I reevaluated the patient and found that they have :improved   Social Determinants of Health:  Lives at home  Dispostion:  After consideration of the diagnostic results and the patients response to treatment, I feel that the patent would benefit from admission.           Final Clinical Impression(s) / ED Diagnoses Final diagnoses:  Anasarca  AKI (acute kidney injury) [facility])    Rx / [facility] Orders ED Discharge Orders     None         Isla Pence, MD 05/30/22 2327

## 2022-05-30 NOTE — ED Triage Notes (Signed)
Pt came from post post d/c from [facility], was here for same thing, previous admission had 36108m fluid removed by paracentesis. Medic arrived and heard wheezing, pt was given x2 douneb with solumedrol, no mag given. Pt has increased wheezing and sob since medic transport with rales being noted in transport.   Vitals: BP 100/46 HR: 100 92% Room air 158cbg

## 2022-05-30 NOTE — H&P (Addendum)
Date: 05/31/2022               Patient Name:  [name] MRN: [id number]  DOB: [birth year] Age / Sex: 62 y.o., male   PCP: Lucious Groves, DO         Medical Service: Internal Medicine Teaching Service         Attending Physician: Dr. Lottie Mussel, MD      First Contact: Dr. Roswell Nickel, MD Pager [phone]    Second Contact: Dr. Madelyn Flavors, MD Pager [phone]         After Hours (After 5p/  First Contact Pager: [phone]  weekends / holidays): Second Contact Pager: [phone]   SUBJECTIVE   Chief Complaint: SOB  History of Present Illness: Andrew Fowler is a 62 yo male with cirrhosis, COPD, OSA, rheumatoid arthritis, T2DM, and severe obesity who presents with shortness of breath.  Patient states his breathing has worsened for past 10 days. More wheezing and a productive cough with green-brown sputum. Denies fevers but has chills. No recent sick contacts. Noticed more swelling in his legs and body in general. He weighs himself everyday and he knew he was gaining weight, despite eating "next to nothing" (notes that food has not tasted right). His weight today is 155 kg/342 lbs, and at discharge on 2/16 he was at 332 lbs. His breathing was better when he was discharged, but since then, his breathing just has worsened. Did not get [facility] set up since patient had to leave hospital early because his sister fell and was in the hospital in [facility]. He saw Dr. Heber [place] in the clinic on 2/29 and his spironolactone and lasix were increased. States he has been adherent to taking the higher doses along with his other medications. Denies any urinary symptoms and has been urinating frequently with the lasix dose. Denies n/v/d. Last BM was yesterday and no melena or hematochezia.   ED Course: Arrived by EMS. Tachycardic 100s, BP 113/52, RR 20-22, SpO2 95% on RA. Labs significant for hyponatremia, bicarb 20, elevated BUN/creatinine 41/2.24, elevated liver enzymes, albumin 1.7. Lactic acid 4.2 to 2.9. CBC showed  Hgb 12.4, chronically elevated WBC and chronic thrombocytopenia 104. Received dose of IV 40 mg lasix and breathing treatments which helped. CXR showed perihilar opacities seen on previous imaging.   Past Medical History -Cirrhosis -COPD -OSA -RA -T2DM -Obesity -Anxiety -Depression -Chronic pain  Meds:  Xanax 0.5 mg bid prn  Atorvastatin 40 mg daily Lasix 40 mg bid (increased on 2/29) Humira (on hold) Norco 10-325 mg q6h PRN Spironolactone 200 mg (up from 100, on 2/29) Prednisone 5 mg daily Stiolto 2 puff daily (unsure if taking that or Spiriva) Varenicline (not taking)  Albuterol prn Nicotine patch  Hydroxychloroquine 200 mg bid (on hold) Protnix 40 mg daily  Past Surgical History Past Surgical History:  Procedure Laterality Date   ESOPHAGOGASTRODUODENOSCOPY (EGD) WITH PROPOFOL N/A 05/08/2022   Procedure: ESOPHAGOGASTRODUODENOSCOPY (EGD) WITH PROPOFOL;  Surgeon: Daryel November, MD;  Location: [facility] ENDOSCOPY;  Service: Gastroenterology;  Laterality: N/A;   IR PARACENTESIS  05/09/2022   LAMINECTOMY  1995   L4-L5   TONSILLECTOMY     TOTAL HIP ARTHROPLASTY Left 2009   Social:  Lives With: cousin and cousin's wife and patient's brother Support: brother helps with daily tasks Level of Function: brother helps with ADLs (unable to get dressed on own); used to be independent; uses wheelchair  PCP: Dr. Heber [facility], [facility] Substances: Smokes cigarettes still (5 cigarettes/day); no alcohol or  other substance use  Family History: diabetes and cancer in mother's side  Allergies: Allergies as of 05/30/2022   (No Known Allergies)    Review of Systems: A complete ROS was negative except as per HPI.   OBJECTIVE:   Physical Exam: Blood pressure (!) 103/33, pulse (!) 109, resp. rate 18, weight (!) 155.5 kg, SpO2 95 % on room air.  Constitutional: chronically ill-appearing male laying in bed, in no acute distress HENT: normocephalic atraumatic, mucous membranes moist Eyes: scleral  icterus present  Neck: supple Cardiovascular: tachycardic, normal rhythm Pulmonary/Chest: normal work of breathing on room air, tight breath sounds with diffuse expiratory wheezing Abdominal: firm but not rigid, distended, tenderness to palpation of epigastrium, no guarding or rebound MSK: 2+ pitting edema bilateral LE; L hand swan neck; bilateral hand resting tremor  Neurological: alert & oriented x 3, no focal deficit Skin: warm and dry, normal skin turgor Psych: normal mood and affect   Labs: CBC    Component Value Date/Time   WBC 12.5 (H) 05/30/2022 2150   RBC 3.79 (L) 05/30/2022 2150   HGB 12.4 (L) 05/30/2022 2150   HGB 18.4 (H) 04/19/2016 1058   HCT 35.3 (L) 05/30/2022 2150   HCT 56.0 (H) 04/19/2016 1058   PLT 104 (L) 05/30/2022 2150   PLT 171 04/19/2016 1058   MCV 93.1 05/30/2022 2150   MCV 93 04/19/2016 1058   MCH 32.7 05/30/2022 2150   MCHC 35.1 05/30/2022 2150   RDW 18.6 (H) 05/30/2022 2150   RDW 14.2 04/19/2016 1058   LYMPHSABS 1.1 05/30/2022 2150   MONOABS 0.9 05/30/2022 2150   EOSABS 0.0 05/30/2022 2150   BASOSABS 0.0 05/30/2022 2150     CMP     Component Value Date/Time   NA 124 (L) 05/30/2022 2150   NA 130 (L) 05/23/2022 1152   K 4.9 05/30/2022 2150   CL 94 (L) 05/30/2022 2150   CO2 20 (L) 05/30/2022 2150   GLUCOSE 153 (H) 05/30/2022 2150   BUN 41 (H) 05/30/2022 2150   BUN 18 05/23/2022 1152   CREATININE 2.24 (H) 05/30/2022 2150   CREATININE 0.92 12/23/2013 0914   CALCIUM 8.0 (L) 05/30/2022 2150   PROT 5.5 (L) 05/30/2022 2150   PROT 6.3 05/23/2022 1152   ALBUMIN 1.7 (L) 05/30/2022 2150   ALBUMIN 2.5 (L) 05/23/2022 1152   AST 99 (H) 05/30/2022 2150   ALT 72 (H) 05/30/2022 2150   ALKPHOS 449 (H) 05/30/2022 2150   BILITOT 5.6 (H) 05/30/2022 2150   BILITOT 4.4 (H) 05/23/2022 1152   GFRNONAA 33 (L) 05/30/2022 2150   GFRNONAA >89 12/23/2013 0914   GFRAA 115 12/21/2019 0943   GFRAA >89 12/23/2013 0914    Imaging: US Abdomen Limited RUQ  (LIVER/GB) FINDINGS: Gallbladder: Multiple small echogenic foci noted along the gallbladder wall. When compared to prior CT, these appear to reflect gallbladder wall calcifications. Some of these may reflect small stones. Gallbladder wall slightly thickened at 3.6 mm.  Common bile duct: Diameter: Normal caliber, 3 mm  Liver: Nodular shrunken liver compatible with cirrhosis. No focal hepatic abnormality. Portal vein is patent on color Doppler imaging with normal direction of blood flow towards the liver.  Other: Right pleural effusion and perihepatic ascites noted.  IMPRESSION: -Changes of cirrhosis.  Associated perihepatic ascites. -Gallbladder wall calcifications and possible small stones. Slight gallbladder wall thickening, likely related to liver disease. -Right pleural effusion.  EKG: personally reviewed my interpretation is sinus tachycardia, rate 111, normal axis, borderline prolonged PR interval. Prior  EKG showed sinus rhythm with prolonged PR interval.   ASSESSMENT & PLAN:   Assessment & Plan by Problem: Principal Problem:   Decompensated hepatic cirrhosis (HCC) Active Problems:   COPD (chronic obstructive pulmonary disease) (HCC)   Andrew Fowler is a 62 y.o. person living with a history of cirrhosis, COPD, OSA, rheumatoid arthritis, T2DM, and severe obesity who presented with shortness of breath and admitted for decompensated hepatic cirrhosis and COPD exacerbation on hospital day 0  Decompensated cirrhosis 2/2 NASH Hyponatremia 2/2 above Presented with worsening dyspnea and edema. Weight appears increased by 10 lbs compared to when patient was discharged on 2/16. Albumin is 1.7. Likely third spacing which is contributing to dyspnea and anasarca from the low albumin. Labs remain consistent for cirrhosis. Na 124. Platelets stable 104. Bili 5.6. Elevated liver enzymes but lower compared to prior labs. PT/INR 15.3/1.2. MELD score 30 (52% 3 month mortality) compared to  26 on last admission. Child Pugh Class C. RUQ ultrasound showed cirrhotic changes with associated perihepatic ascites. Last admission, ascites noted and 2/15 paracentesis yielded 2.7 L of transudate fluid. He may benefit from paracentesis during this admission. He received IV 40 mg lasix in ED. Still reports adequate urine output. Will continue his spironolactone 200 mg. Will f/u on lactic acid which was initially 4.2 then 2.9. Consult to IR placed, patient is NPO.  -continue home spironolactone  -consider further diuresis tomorrow -f/u lactic acid  -IR consult for paracentesis -NPO for now, previously on low sodium diet -CMP  COPD exacerbation Presents with worsening dyspnea and cough with increase sputum production. CXR showed perihilar opacities. He remains stable on room air. Received nebulizer treatments which helped. He states he has been using his PRN albuterol and daily LABA/LAMA. RVP negative. Will start prednisone and CAP coverage for acute exacerbation.  -start prednisone 40 mg daily for 5 days -start rocephin and azithromycin for 5 days -f/u sputum culture -hospital equivalent LABA/LAMA -Duoneb q4h PRN -PT/OT eval   AKI Hx of urinary retention Creatinine elevated 2.24 and BUN 41. Baseline creatinine around 1.0. Patient continues to have peripheral edema and noted weight increase since last discharge. Received dose of Lasix in ED. He may be intravascularly depleted in setting of cirrhosis and low albumin leading to third spacing. Has hx of urinary retention on prior admission requiring foley catheter placement. Patient states having frequent voids from the lasix, will monitor for concerns of urinary retention.  -monitor renal function with AM CMP -monitor urine output  Adrenal insufficiency 2/2 chronic steroid use Rheumatoid arthritis Patient reports being on prednisone 5 mg daily. Given concern for COPD exacerbation, started patient on prednisone 40 mg. His Humira and  hydroxychloroquine were held in February by his rheumatologist. Continue outpatient f/u with Dr. Tamera Punt from rheumatologist.   HTN HLD Normotensive but at times on low normal BP. He is on spironolactone 200 mg daily. Also on atorvastatin 40 mg daily. -continue home spironolactone -continue home atorvastatin  Abdominal pain Hx of hematemesis Last admission reported episodes of rust-colored emesis. EGD negative for bleeding ulcer. Concern then was possible gastroparesis. Patient still has some epigastric tenderness on exam but otherwise no guarding or rebound. No recent n/v/d. Patient on Protonix 40 mg and was supposed to f/u with GI outpatient but cancelled appointment. He will need to continue outpatient f/u with GI for gastric emptying study.  -continue home Protonix  Tobacco use disorder Patient is trying to quit smoking. Not on Chantix. Has nicotine patches at home which has been  helping.  -continue nicotine patch  Generalized debility At baseline, patient ambulates with motorized wheelchair. Patient states he has not received any home health therapy after last discharge. Will have PT and OT evaluate him during this admission.   Diet: NPO VTE: Enoxaparin IVF: None,None Code: Full  Prior to Admission Living Arrangement: Home, living with brother and cousin Anticipated Discharge Location:  TBD Barriers to Discharge: medical management of volume status and COPD exacerbation  Dispo: Admit patient to Inpatient with expected length of stay greater than 2 midnights.  Signed: Angelique Blonder, DO Internal Medicine Resident PGY-1 05/31/2022, 1:38 AM   Please contact on call pager, weekdays after 5pm and weekends after 1pm, at [phone].

## 2022-05-31 ENCOUNTER — Inpatient Hospital Stay (HOSPITAL_COMMUNITY): Payer: Medicaid Other

## 2022-05-31 ENCOUNTER — Other Ambulatory Visit: Payer: Self-pay

## 2022-05-31 ENCOUNTER — Encounter (HOSPITAL_COMMUNITY): Payer: Self-pay | Admitting: Internal Medicine

## 2022-05-31 DIAGNOSIS — Z66 Do not resuscitate: Secondary | ICD-10-CM | POA: Diagnosis not present

## 2022-05-31 DIAGNOSIS — E1165 Type 2 diabetes mellitus with hyperglycemia: Secondary | ICD-10-CM | POA: Diagnosis present

## 2022-05-31 DIAGNOSIS — I10 Essential (primary) hypertension: Secondary | ICD-10-CM | POA: Diagnosis present

## 2022-05-31 DIAGNOSIS — R188 Other ascites: Secondary | ICD-10-CM | POA: Diagnosis not present

## 2022-05-31 DIAGNOSIS — Z6841 Body Mass Index (BMI) 40.0 and over, adult: Secondary | ICD-10-CM | POA: Diagnosis not present

## 2022-05-31 DIAGNOSIS — K7200 Acute and subacute hepatic failure without coma: Secondary | ICD-10-CM | POA: Diagnosis present

## 2022-05-31 DIAGNOSIS — R6521 Severe sepsis with septic shock: Secondary | ICD-10-CM | POA: Diagnosis present

## 2022-05-31 DIAGNOSIS — J189 Pneumonia, unspecified organism: Secondary | ICD-10-CM | POA: Diagnosis present

## 2022-05-31 DIAGNOSIS — R601 Generalized edema: Principal | ICD-10-CM

## 2022-05-31 DIAGNOSIS — J90 Pleural effusion, not elsewhere classified: Secondary | ICD-10-CM | POA: Diagnosis not present

## 2022-05-31 DIAGNOSIS — N179 Acute kidney failure, unspecified: Secondary | ICD-10-CM

## 2022-05-31 DIAGNOSIS — Z1152 Encounter for screening for COVID-19: Secondary | ICD-10-CM | POA: Diagnosis not present

## 2022-05-31 DIAGNOSIS — R918 Other nonspecific abnormal finding of lung field: Secondary | ICD-10-CM | POA: Diagnosis not present

## 2022-05-31 DIAGNOSIS — K56600 Partial intestinal obstruction, unspecified as to cause: Secondary | ICD-10-CM | POA: Diagnosis not present

## 2022-05-31 DIAGNOSIS — R0600 Dyspnea, unspecified: Secondary | ICD-10-CM | POA: Diagnosis not present

## 2022-05-31 DIAGNOSIS — E43 Unspecified severe protein-calorie malnutrition: Secondary | ICD-10-CM | POA: Diagnosis present

## 2022-05-31 DIAGNOSIS — M069 Rheumatoid arthritis, unspecified: Secondary | ICD-10-CM | POA: Diagnosis present

## 2022-05-31 DIAGNOSIS — A419 Sepsis, unspecified organism: Secondary | ICD-10-CM | POA: Diagnosis present

## 2022-05-31 DIAGNOSIS — J9601 Acute respiratory failure with hypoxia: Secondary | ICD-10-CM | POA: Diagnosis not present

## 2022-05-31 DIAGNOSIS — J9621 Acute and chronic respiratory failure with hypoxia: Secondary | ICD-10-CM | POA: Diagnosis present

## 2022-05-31 DIAGNOSIS — J440 Chronic obstructive pulmonary disease with acute lower respiratory infection: Secondary | ICD-10-CM | POA: Diagnosis present

## 2022-05-31 DIAGNOSIS — K7581 Nonalcoholic steatohepatitis (NASH): Secondary | ICD-10-CM | POA: Diagnosis not present

## 2022-05-31 DIAGNOSIS — J9602 Acute respiratory failure with hypercapnia: Secondary | ICD-10-CM | POA: Diagnosis not present

## 2022-05-31 DIAGNOSIS — E875 Hyperkalemia: Secondary | ICD-10-CM | POA: Diagnosis not present

## 2022-05-31 DIAGNOSIS — D696 Thrombocytopenia, unspecified: Secondary | ICD-10-CM | POA: Diagnosis present

## 2022-05-31 DIAGNOSIS — K567 Ileus, unspecified: Secondary | ICD-10-CM | POA: Diagnosis not present

## 2022-05-31 DIAGNOSIS — G9340 Encephalopathy, unspecified: Secondary | ICD-10-CM | POA: Diagnosis not present

## 2022-05-31 DIAGNOSIS — D649 Anemia, unspecified: Secondary | ICD-10-CM | POA: Diagnosis present

## 2022-05-31 DIAGNOSIS — K6389 Other specified diseases of intestine: Secondary | ICD-10-CM | POA: Diagnosis not present

## 2022-05-31 DIAGNOSIS — J9622 Acute and chronic respiratory failure with hypercapnia: Secondary | ICD-10-CM | POA: Diagnosis present

## 2022-05-31 DIAGNOSIS — Z515 Encounter for palliative care: Secondary | ICD-10-CM | POA: Diagnosis not present

## 2022-05-31 DIAGNOSIS — E273 Drug-induced adrenocortical insufficiency: Secondary | ICD-10-CM | POA: Diagnosis present

## 2022-05-31 DIAGNOSIS — Z7189 Other specified counseling: Secondary | ICD-10-CM | POA: Diagnosis not present

## 2022-05-31 DIAGNOSIS — R0602 Shortness of breath: Secondary | ICD-10-CM | POA: Diagnosis not present

## 2022-05-31 DIAGNOSIS — K767 Hepatorenal syndrome: Secondary | ICD-10-CM | POA: Diagnosis present

## 2022-05-31 DIAGNOSIS — K7290 Hepatic failure, unspecified without coma: Secondary | ICD-10-CM | POA: Diagnosis not present

## 2022-05-31 DIAGNOSIS — K7460 Unspecified cirrhosis of liver: Secondary | ICD-10-CM | POA: Diagnosis not present

## 2022-05-31 DIAGNOSIS — R579 Shock, unspecified: Secondary | ICD-10-CM | POA: Diagnosis not present

## 2022-05-31 DIAGNOSIS — J441 Chronic obstructive pulmonary disease with (acute) exacerbation: Secondary | ICD-10-CM | POA: Diagnosis present

## 2022-05-31 HISTORY — PX: IR PARACENTESIS: IMG2679

## 2022-05-31 LAB — URINALYSIS, COMPLETE (UACMP) WITH MICROSCOPIC
Bilirubin Urine: NEGATIVE
Glucose, UA: 50 mg/dL — AB
Ketones, ur: NEGATIVE mg/dL
Leukocytes,Ua: NEGATIVE
Nitrite: NEGATIVE
Protein, ur: 30 mg/dL — AB
Specific Gravity, Urine: 1.013 (ref 1.005–1.030)
pH: 5 (ref 5.0–8.0)

## 2022-05-31 LAB — COMPREHENSIVE METABOLIC PANEL
ALT: 73 U/L — ABNORMAL HIGH (ref 0–44)
AST: 85 U/L — ABNORMAL HIGH (ref 15–41)
Albumin: 1.8 g/dL — ABNORMAL LOW (ref 3.5–5.0)
Alkaline Phosphatase: 456 U/L — ABNORMAL HIGH (ref 38–126)
Anion gap: 9 (ref 5–15)
BUN: 44 mg/dL — ABNORMAL HIGH (ref 8–23)
CO2: 23 mmol/L (ref 22–32)
Calcium: 8.3 mg/dL — ABNORMAL LOW (ref 8.9–10.3)
Chloride: 93 mmol/L — ABNORMAL LOW (ref 98–111)
Creatinine, Ser: 2.16 mg/dL — ABNORMAL HIGH (ref 0.61–1.24)
GFR, Estimated: 34 mL/min — ABNORMAL LOW (ref >60)
Glucose, Bld: 160 mg/dL — ABNORMAL HIGH (ref 70–99)
Potassium: 4.5 mmol/L (ref 3.5–5.1)
Sodium: 125 mmol/L — ABNORMAL LOW (ref 135–145)
Total Bilirubin: 5.4 mg/dL — ABNORMAL HIGH (ref 0.3–1.2)
Total Protein: 6.1 g/dL — ABNORMAL LOW (ref 6.5–8.1)

## 2022-05-31 LAB — LACTIC ACID, PLASMA
Lactic Acid, Venous: 2.9 mmol/L (ref 0.5–1.9)
Lactic Acid, Venous: 2 mmol/L (ref 0.5–1.9)
Lactic Acid, Venous: 1.8 mmol/L (ref 0.5–1.9)

## 2022-05-31 LAB — GRAM STAIN: Gram Stain: NONE SEEN

## 2022-05-31 LAB — RESP PANEL BY RT-PCR (RSV, FLU A&B, COVID)  RVPGX2
Influenza A by PCR: NEGATIVE
Influenza B by PCR: NEGATIVE
Resp Syncytial Virus by PCR: NEGATIVE
SARS Coronavirus 2 by RT PCR: NEGATIVE

## 2022-05-31 LAB — PROTEIN, PLEURAL OR PERITONEAL FLUID: Total protein, fluid: <3 g/dL

## 2022-05-31 LAB — CBC
HCT: 34.9 % — ABNORMAL LOW (ref 39.0–52.0)
Hemoglobin: 13.1 g/dL (ref 13.0–17.0)
MCH: 33.1 pg (ref 26.0–34.0)
MCHC: 37.5 g/dL — ABNORMAL HIGH (ref 30.0–36.0)
MCV: 88.1 fL (ref 80.0–100.0)
Platelets: 82 10*3/uL — ABNORMAL LOW (ref 150–400)
RBC: 3.96 MIL/uL — ABNORMAL LOW (ref 4.22–5.81)
RDW: 18.4 % — ABNORMAL HIGH (ref 11.5–15.5)
WBC: 9.7 10*3/uL (ref 4.0–10.5)

## 2022-05-31 LAB — AMMONIA: Ammonia: 99 umol/L — ABNORMAL HIGH (ref 9–35)

## 2022-05-31 LAB — EXPECTORATED SPUTUM ASSESSMENT W GRAM STAIN, RFLX TO RESP C

## 2022-05-31 LAB — BODY FLUID CELL COUNT WITH DIFFERENTIAL
Eos, Fluid: 0 %
Lymphs, Fluid: 20 %
Monocyte-Macrophage-Serous Fluid: 41 % — ABNORMAL LOW (ref 50–90)
Neutrophil Count, Fluid: 39 % — ABNORMAL HIGH (ref 0–25)
Total Nucleated Cell Count, Fluid: 94 cu mm (ref 0–1000)

## 2022-05-31 LAB — PROTIME-INR
INR: 1.3 — ABNORMAL HIGH (ref 0.8–1.2)
Prothrombin Time: 15.8 seconds — ABNORMAL HIGH (ref 11.4–15.2)

## 2022-05-31 LAB — GLUCOSE, PLEURAL OR PERITONEAL FLUID: Glucose, Fluid: 162 mg/dL

## 2022-05-31 LAB — TROPONIN I (HIGH SENSITIVITY): Troponin I (High Sensitivity): 17 ng/L (ref <18)

## 2022-05-31 LAB — LACTATE DEHYDROGENASE, PLEURAL OR PERITONEAL FLUID: LD, Fluid: <25 U/L — ABNORMAL HIGH (ref 3–23)

## 2022-05-31 MED ORDER — AZITHROMYCIN 500 MG PO TABS: 250.0000 mg | ORAL_TABLET | Freq: Every day | ORAL | Status: DC

## 2022-05-31 MED ORDER — PREDNISONE 20 MG PO TABS
40.0000 mg | ORAL_TABLET | Freq: Every day | ORAL | Status: DC
  Administered 2022-05-31: 40 mg via ORAL
  Filled 2022-05-31: qty 2

## 2022-05-31 MED ORDER — AZITHROMYCIN 500 MG PO TABS
500.0000 mg | ORAL_TABLET | Freq: Every day | ORAL | Status: DC
  Administered 2022-05-31: 500 mg via ORAL
  Filled 2022-05-31 (×2): qty 1

## 2022-05-31 MED ORDER — LIDOCAINE HCL 1 % IJ SOLN
INTRAMUSCULAR | Status: AC
  Administered 2022-05-31: 10 mL via INTRADERMAL
  Filled 2022-05-31: qty 20

## 2022-05-31 MED ORDER — ENOXAPARIN SODIUM 80 MG/0.8ML IJ SOSY
80.0000 mg | PREFILLED_SYRINGE | Freq: Every day | INTRAMUSCULAR | Status: DC
  Administered 2022-05-31: 80 mg via SUBCUTANEOUS
  Filled 2022-05-31 (×2): qty 0.8

## 2022-05-31 MED ORDER — SODIUM CHLORIDE 0.9 % IV SOLN
1.0000 g | INTRAVENOUS | Status: DC
  Administered 2022-05-31: 1 g via INTRAVENOUS
  Filled 2022-05-31: qty 10

## 2022-05-31 MED ORDER — IPRATROPIUM-ALBUTEROL 0.5-2.5 (3) MG/3ML IN SOLN
3.0000 mL | RESPIRATORY_TRACT | Status: DC
  Administered 2022-05-31 – 2022-06-08 (×48): 3 mL via RESPIRATORY_TRACT
  Filled 2022-05-31 (×47): qty 3

## 2022-05-31 MED ORDER — SPIRONOLACTONE 25 MG PO TABS
200.0000 mg | ORAL_TABLET | Freq: Every day | ORAL | Status: DC
  Administered 2022-05-31: 200 mg via ORAL
  Filled 2022-05-31: qty 8

## 2022-05-31 MED ORDER — HYDROCODONE-ACETAMINOPHEN 10-325 MG PO TABS
1.0000 | ORAL_TABLET | Freq: Four times a day (QID) | ORAL | Status: DC | PRN
  Administered 2022-05-31 (×2): 1 via ORAL
  Filled 2022-05-31 (×3): qty 1

## 2022-05-31 MED ORDER — NICOTINE 14 MG/24HR TD PT24
14.0000 mg | MEDICATED_PATCH | Freq: Every day | TRANSDERMAL | Status: DC
  Administered 2022-05-31: 14 mg via TRANSDERMAL
  Filled 2022-05-31: qty 1

## 2022-05-31 MED ORDER — ATORVASTATIN CALCIUM 40 MG PO TABS
40.0000 mg | ORAL_TABLET | Freq: Every day | ORAL | Status: DC
  Administered 2022-05-31: 40 mg via ORAL
  Filled 2022-05-31: qty 1

## 2022-05-31 MED ORDER — ALBUMIN HUMAN 25 % IV SOLN
100.0000 g | Freq: Every day | INTRAVENOUS | Status: DC
  Administered 2022-05-31: 100 g via INTRAVENOUS
  Filled 2022-05-31 (×2): qty 400

## 2022-05-31 MED ORDER — HYDROXYZINE HCL 25 MG PO TABS
25.0000 mg | ORAL_TABLET | Freq: Once | ORAL | Status: AC | PRN
  Administered 2022-06-01: 25 mg via ORAL
  Filled 2022-05-31: qty 1

## 2022-05-31 MED ORDER — ALPRAZOLAM 0.5 MG PO TABS
0.5000 mg | ORAL_TABLET | Freq: Two times a day (BID) | ORAL | Status: DC | PRN
  Administered 2022-05-31: 0.5 mg via ORAL
  Filled 2022-05-31: qty 1

## 2022-05-31 MED ORDER — AZITHROMYCIN 500 MG PO TABS: 500.0000 mg | ORAL_TABLET | Freq: Once | ORAL | Status: DC

## 2022-05-31 MED ORDER — ARFORMOTEROL TARTRATE 15 MCG/2ML IN NEBU
15.0000 ug | INHALATION_SOLUTION | Freq: Two times a day (BID) | RESPIRATORY_TRACT | Status: DC
  Administered 2022-05-31 (×2): 15 ug via RESPIRATORY_TRACT
  Filled 2022-05-31 (×3): qty 2

## 2022-05-31 MED ORDER — IPRATROPIUM-ALBUTEROL 0.5-2.5 (3) MG/3ML IN SOLN: 3.0000 mL | RESPIRATORY_TRACT | Status: DC | PRN

## 2022-05-31 MED ORDER — UMECLIDINIUM BROMIDE 62.5 MCG/ACT IN AEPB
1.0000 | INHALATION_SPRAY | Freq: Every day | RESPIRATORY_TRACT | Status: DC
  Administered 2022-05-31: 1 via RESPIRATORY_TRACT
  Filled 2022-05-31 (×2): qty 7

## 2022-05-31 MED ORDER — PANTOPRAZOLE SODIUM 40 MG PO TBEC
40.0000 mg | DELAYED_RELEASE_TABLET | Freq: Every day | ORAL | Status: DC
  Administered 2022-05-31: 40 mg via ORAL
  Filled 2022-05-31: qty 1

## 2022-05-31 NOTE — Evaluation (Signed)
Occupational Therapy Evaluation Patient Details Name: Andrew Fowler MRN: [id number] DOB: June 05, 1960 Today's Date: 05/31/2022   History of Present Illness Andrew Fowler is a 62 y.o. male who presented to the ED on 3/7 for with shortness of breath, cough and worsening of leg and abdominal swelling, admitted w/ dx: decompensated hepatic cirrhosis. S/p paracentesis 3/8. Pt with admission in Feb for same.  PMH: cirrhosis, COPD, OSA, rheumatoid arthritis, type 2 diabetes, gout, HTN, HLD.   Clinical Impression   Pt reports being able to transfer to his scooter at his baseline and functioning modified independently in self care. Since his hospitalization last month, he has been very dependent on his brother for bed mobility, transfers and much of his ADLs. SNF was recommended last admission, but pt refused and went home. Pt presents with pain, generalized weakness and decreased activity tolerance. He declined OOB stating he prefers to wait until the swelling in his legs goes down. Pt requires mod assist for bed mobility and set up to total assist for ADLs. He is agreeable to ST rehab in SNF.     Recommendations for follow up therapy are one component of a multi-disciplinary discharge planning process, led by the attending physician.  Recommendations may be updated based on patient status, additional functional criteria and insurance authorization.   Follow Up Recommendations  Skilled nursing-short term rehab (<3 hours/day)     Assistance Recommended at Discharge Intermittent Supervision/Assistance  Patient can return home with the following Two people to help with walking and/or transfers;A lot of help with bathing/dressing/bathroom;Assistance with cooking/housework;Assist for transportation;Help with stairs or ramp for entrance    Functional Status Assessment  Patient has had a recent decline in their functional status and demonstrates the ability to make significant improvements in function in a  reasonable and predictable amount of time.  Equipment Recommendations  None recommended by OT    Recommendations for Other Services       Precautions / Restrictions Precautions Precautions: Fall Restrictions Weight Bearing Restrictions: No      Mobility Bed Mobility Overal bed mobility: Needs Assistance Bed Mobility: Supine to Sit, Sit to Supine     Supine to sit: Mod assist, HOB elevated Sit to supine: Mod assist   General bed mobility comments: assist to raise trunk and for LEs over EOB, pt uses momentum and rail    Transfers                   General transfer comment: pt declined      Balance Overall balance assessment: Needs assistance   Sitting balance-Leahy Scale: Fair                                     ADL either performed or assessed with clinical judgement   ADL Overall ADL's : Needs assistance/impaired Eating/Feeding: Set up;Bed level   Grooming: Set up;Sitting;Wash/dry face;Wash/dry hands   Upper Body Bathing: Moderate assistance;Sitting   Lower Body Bathing: Total assistance;Bed level   Upper Body Dressing : Moderate assistance;Sitting   Lower Body Dressing: Total assistance;Bed level       Toileting- Clothing Manipulation and Hygiene: Total assistance;Bed level         General ADL Comments: Session limited by pt to EOB, prefers to wait until LE swelling decreases to work on [place].     Vision Baseline Vision/History: 1 Wears glasses Ability to See in Adequate Light: 0 Adequate  Patient Visual Report: No change from baseline       Perception     Praxis      Pertinent Vitals/Pain Pain Assessment Pain Assessment: Faces Faces Pain Scale: Hurts little more Pain Location: lower abdomen, L shoulders, B LEs Pain Descriptors / Indicators: Discomfort, Grimacing, Guarding Pain Intervention(s): Limited activity within patient's tolerance, Repositioned, Monitored during session     Hand Dominance Right    Extremity/Trunk Assessment Upper Extremity Assessment Upper Extremity Assessment: LUE deficits/detail LUE Deficits / Details: shoulder pain, full ROM   Lower Extremity Assessment Lower Extremity Assessment: Defer to PT evaluation   Cervical / Trunk Assessment Cervical / Trunk Assessment: Other exceptions (morbid obesity, weakness)   Communication Communication Communication: No difficulties   Cognition Arousal/Alertness: Awake/alert Behavior During Therapy: WFL for tasks assessed/performed Overall Cognitive Status: Within Functional Limits for tasks assessed                                       General Comments       Exercises     Shoulder Instructions      Home Living Family/patient expects to be discharged to:: Private residence Living Arrangements: Other relatives (brother) Available Help at Discharge: Family;Available 24 hours/day Type of Home: House Home Access: Ramped entrance     Home Layout: One level     Bathroom Shower/Tub: Sponge bathes at baseline (at kitchen sink)   Bathroom Toilet: Handicapped height     Home Equipment: BSC/3in1;Electric scooter          Prior Functioning/Environment Prior Level of Function : Needs assist             Mobility Comments: Pt uses scooter, can typically transfer himself. Has recently needed assist to get OOB. ADLs Comments: Since recent d/c, pt has been dependent in back side bathing, pericare, LB ADLs and meal prep by his brother.        OT Problem List: Decreased strength;Decreased activity tolerance;Impaired balance (sitting and/or standing);Obesity;Pain      OT Treatment/Interventions: Self-care/ADL training;DME and/or AE instruction;Therapeutic activities;Patient/family education;Balance training    OT Goals(Current goals can be found in the care plan section) Acute Rehab OT Goals OT Goal Formulation: With patient Time For Goal Achievement: 06/14/22 Potential to Achieve Goals:  Fair ADL Goals Pt Will Perform Upper Body Bathing: with supervision;sitting;with adaptive equipment Pt Will Perform Lower Body Bathing: sit to/from stand;with adaptive equipment;with min assist Pt Will Perform Upper Body Dressing: with set-up;sitting Pt Will Perform Lower Body Dressing: with min assist;with adaptive equipment;sit to/from stand Pt Will Transfer to Toilet: with min assist;stand pivot transfer;bedside commode Additional ADL Goal #1: Pt will perform bed mobility modified independently in preparation for ADLs.  OT Frequency: Min 2X/week    Co-evaluation              AM-PAC OT "6 Clicks" Daily Activity     Outcome Measure Help from another person eating meals?: None Help from another person taking care of personal grooming?: A Little Help from another person toileting, which includes using toliet, bedpan, or urinal?: Total Help from another person bathing (including washing, rinsing, drying)?: A Lot Help from another person to put on and taking off regular upper body clothing?: A Lot Help from another person to put on and taking off regular lower body clothing?: Total 6 Click Score: 13   End of Session Nurse Communication: Other (comment) (d/c recommendation of SNF)  Activity Tolerance: Patient limited by pain;Patient limited by fatigue Patient left: in bed;with call bell/phone within reach;with bed alarm set;with nursing/sitter in room  OT Visit Diagnosis: Unsteadiness on feet (R26.81);Pain;Muscle weakness (generalized) (M62.81)                Time: [id number] OT Time Calculation (min): 17 min Charges:  OT General Charges $OT Visit: 1 Visit OT Evaluation $OT Eval Moderate Complexity: [place], OTR/L Acute Rehabilitation Services Office: [phone]  [name] 05/31/2022, 12:40 PM

## 2022-05-31 NOTE — Progress Notes (Signed)
Pt. Has not voided yet, states he feels like he has to but can't, bladder scan 536, MD aware, order for in and out cath obtained  [facility]

## 2022-05-31 NOTE — Plan of Care (Signed)

## 2022-05-31 NOTE — Progress Notes (Signed)
PT Cancellation Note  Patient Details Name: Andrew Fowler MRN: [id number] DOB: [birth year]   Cancelled Treatment:    Reason Eval/Treat Not Completed: Other (comment);Patient at procedure or test/unavailable.  Paracentesis, will return as time and pt allow.   Ramond Dial 05/31/2022, 9:27 AM  Mee Hives, PT PhD Acute Rehab Dept. Number: [place] [facility] [place]

## 2022-05-31 NOTE — Progress Notes (Signed)
OT Cancellation Note  Patient Details Name: Andrew Fowler MRN: [id number] DOB: 01-19-1961   Cancelled Treatment:    Reason Eval/Treat Not Completed: Patient at procedure or test/ unavailable (paracentesis). Will return.  [name] 05/31/2022, 8:47 AM Cleta Alberts, OTR/L Acute Rehabilitation Services Office: [phone]

## 2022-05-31 NOTE — Progress Notes (Signed)
RT called to assess patient due to respiratory distress. RT asked RN to place patient on [place] before I arrived. Upon entering the patient's room he states he feels much better with the [place] 2L, sats 94%. RN at bedside also gave patient his anxiety medication and said his WOB had improved. Patient has expiratory wheezes with scheduled Duoneb Q4 and Brovana BID.  RT will continue to monitor.

## 2022-05-31 NOTE — Procedures (Signed)
PROCEDURE SUMMARY:  Successful ultrasound guided paracentesis from the left quadrant.  Yielded 2.4 L of clear yellow fluid.  No immediate complications.  The patient tolerated the procedure well.   Specimen sent for labs.  EBL < 2 mL  The patient has required >/=2 paracenteses in a 30 day period and a screening evaluation by the [facility] has been arranged.  Soyla Dryer, [place] [phone] 05/31/2022, 9:26 AM

## 2022-05-31 NOTE — Progress Notes (Addendum)
Subjective:  Andrew Fowler was feeling bad this morning, experiencing the urge to urinate but cannot actually pee. Over the last few nights, he has only urinated once instead of his usual 2-3 times. Intentionally drinking less thinking that it would help his swelling. Denies taking lactulose. Paracentesis procedure earlier this morning was successful. Discussed plan to give albumin and hold diuretic medications. Explained diagnosis of cirrhosis and that interventions will target symptoms rather than the disease process itself.    Objective: Vitals over previous 24hr: Vitals:   05/31/22 0847 05/31/22 0924 05/31/22 0954 05/31/22 1346  BP: 111/61 (!) 115/59 120/60   Pulse:   (!) 105 (!) 109  Resp:   18   Temp:   98.1 F (36.7 C)   TempSrc:   Oral   SpO2:   94% 95%  Weight:        General:      awake and alert, lying in bed, cooperative, not in acute distress Skin:       warm and dry, intact without any obvious lesions or scars, no rashes Eyes:      Mild jaundice Lungs:      normal respiratory effort, breathing unlabored, symmetrical chest rise, mild expiratory wheezing, no crackles Cardiac:      regular rate and rhythm, normal S1 and S2, pitting edema 4+ to above knees bilaterally Abdomen:      soft and mildly distended, hypoactive bowel sounds present in all four quadrants, mild suprapubic tenderness to palpation, no guarding, flank pitting edema 2+ Neurologic:      oriented to person-place-time, moving all extremities, no gross focal deficits Psychiatric:      euthymic mood with congruent affect, intelligible speech    Assessment/Plan: Andrew Fowler is a 62 year old male with a past medical history of cirrhosis secondary to NASh, COPD, RA, adrenal insufficiency, hypertension, hyperlipidemia, and tobacco use who presents for breath shortness and lower extremity swelling, now admitted for decompensated cirrhosis and AKI.    ---Decompensated cirrhosis secondary to  NASh ---Hypoalbuminemia ---Hyponatremia Patient has history of cirrhosis secondary to non-alcoholic steatohepatitis identified via biopsy and was recently 04-2022 hospitalized for acute decompensation. Paracentesis during that hospitalization yielded 2.7L. On 2-16, home spironolactone and furosemide doses were doubled after [facility] visit. He presented on 3-8 with increased dyspnea and swelling. Upon arrival, weight had increased to 156kg from 151kg on date of previous discharge. Laboratory testing including Na, Alb, TBili, AST, ALT, and PT are consistent with cirrhosis. Creatinine was also elevated, which suggests dehydration especially given increased diuretic regimen and reduced oral intake. Ultrasound of RUQ demonstrated cirrhotic changes and associated perihepatic ascites. Interventional radiology performed paracentesis on 3-8 and removed 2.4L transudate fluid. Albumin repletion has been initiated.  > Albumin 100g x2  > Trend BMP q24  > Hold spironolactone and furosemide given elevated creatinine   ---Chronic obstructive pulmonary disease exacerbation Patient has history of chronic obstructive pulmonary disease, takes tiotropium and olodaterol at home for maintenance therapy. He reports increased breath shortness over last few weeks without changes in sputum quantity or quality. Upon arrival, chest radiograph demonstrated perihilar opacities. Exam notable for mild wheezing. Findings are consistent with mild COPD exacerbation. Treatment with prednisone and azithromycin started on admission.  > Prednisone '40mg'$  q24, last dose 3-12  > Azithromycin '500mg'$  q24, last dose 3-10  > Umeclidinium 62.5ug q24  > Arformoterol 15ug q12  > Ipratropium-albuterol 0.5-2.5ug q4   > Follow sputum culture, no growth day 0   ---Acute kidney injury ---Urinary retention Patient  developed urinary retention during previous hospitalization and required catheter until discharge. He reports decreased urinary output over the  last several days and has been intentionally drinking less fluids per recommendation of [facility] providers. Home doses of spironolactone and furosemide were doubled on 2-29. Upon arrival on 3-7, laboratory testing demonstrated creatinine 2.24 elevated above baseline of 1.0-1.1. Etiology likely dehydration in setting of poor oral intake and increased diuretic regimen. Primary team is currently holding diuretics to preserve renal function. Bladder scan on 3-8 demonstrated evidence of retention, urinary catheterization may prove necessary.  > Monitor ins-outs  > Trend bladder volume q8  > Trend BMP q24   ---Generalized debility At baseline, the patient ambulates with motorized wheelchair. He presented with marked lower extremity and abdominal swelling that is limiting his mobility. Occupational and physical therapy prescribed upon discharge, patient never completed any visits.  > Physical-occupational therapy evaluation   ---Adrenal insufficiency secondary to chronic steroid use ---Rheumatoid arthritis Patient has history of rheumatoid arthritis managed at home with adalimumab, hydroxychloroquine, and prednisone. Currently follows with rheumatologist as outpatient. Cosyntropin stimulation test performed during prior hospitalization consistent with adrenal insufficiency, patient takes prednisone at home. Additional prednisone prescribed for COPD exacerbation, which also provides coverage for increased physiologic stress of acute illness.  > Prednisone '40mg'$  q24   ---Hypertension ---Hyperlipidemia Patient has history of hypertension managed at home with spironolactone and furosemide. Also has hyperlipidemia and takes atorvastatin.  > Atorvastatin '40mg'$  q24  > Hold spironolactone and furosemide given elevated creatinine   ---Tobacco use disorder Patient currently smokes tobacco and is actively trying to quit, uses nicotine patches at home.  > Nicotine '14mg'$  q24    Principal Problem:   Decompensated  hepatic cirrhosis (HCC) Active Problems:   COPD (chronic obstructive pulmonary disease) (HCC)   AKI (acute kidney injury) ([place])   Anasarca    Prior to Admission Living Arrangement: home with brother and cousin Anticipated Discharge Location: pending Barriers to Discharge: medical management Dispo: Anticipated discharge in approximately 3-4 day(s).    Roswell Nickel, MD Internal Medicine PGY-1 Pager [phone]  After 5pm on weekdays and 1pm on weekends: On Call pager [phone]

## 2022-05-31 NOTE — Hospital Course (Signed)
He feels bad today, has not urinated since last night. He usually urinates about 2-3 times a night, but has only urinated about once overnight for the past 3 days. He has been limiting his fluid intake before coming in. He went for his paracentesis earlier today. He has been passing 1-3 BM every day. Not on lactulose.

## 2022-05-31 NOTE — Progress Notes (Signed)
Pt. C/o some shortness of breath, RR 22, increased expiratory wheezing and rhonchi noted throughout, O2 sat 92 % on R/A, RT called, O2 at 2 L via N/C applied, pt. States he feels anxious, Xanax given, pt. States he feels a bit better, O2 sat 94% on 2 L via N/C, will cont. To monitor  Andrew Fowler

## 2022-05-31 NOTE — ED Notes (Signed)
ED TO INPATIENT HANDOFF REPORT  ED Nurse Name and Phone #: Theresia Lo A. Laurian Edrington, RN  S Name/Age/Gender Andrew Fowler 62 y.o. male Room/Bed: 027C/027C  Code Status   Code Status: Full Code  Home/SNF/Other Home Patient oriented to: self, place, time, and situation Is this baseline? Yes   Triage Complete: Triage complete  Chief Complaint Decompensated hepatic cirrhosis ([place]) [K72.90, K74.60]  Triage Note Pt came from post post d/c from [facility], was here for same thing, previous admission had 3639m fluid removed by paracentesis. Medic arrived and heard wheezing, pt was given x2 douneb with solumedrol, no mag given. Pt has increased wheezing and sob since medic transport with rales being noted in transport.   Vitals: BP 100/46 HR: 100 92% Room air 158cbg    Allergies No Known Allergies  Level of Care/Admitting Diagnosis ED Disposition     ED Disposition  Admit   Condition  --   Comment  [id number]  Level of Care: Telemetry Medical [104]  [facility] equivalent level of care is available:: No  Covid Evaluation: Asymptomatic - no recent exposure (last 10 days) testing not required  Diagnosis: Decompensated hepatic cirrhosis ([facility] [[id number] Admitting Physician: MLottie Mussel[[id number] Attending Physician: MLottie Mussel[[id number] Certification:: I certify this patient will need inpatient services for at least 2 midnights  Estimated Length of Stay: 2          B Medical/Surgery History Past Medical History:  Diagnosis Date   Abnormal laboratory test result 07/04/2011   Immunofixation electrophoresis 3/13 showed slightly restricted mobility in the IgG and kappa lanes and suggested repeat end of year.    Anxiety associated with depression 04/19/2011   On chronic benzos and SSRI's. Gets panic attacks. Does not see mental health.   Chronic pain syndrome 04/19/2011   Combination of RA, obesity,  OA, & DDD. Has seen Dr DSharol Given& s/p R total hip arthroplasty 2/2 OA & ? AVN. s/p L4-L5 laminectomy. Lumbar MRI 4/11 : L3-L4 mod central canal narrowing.  Cervical MRI 6/11 : multi-level DDD and L foraminal stenosis at C4-5 and C5-6.    Chronic venous insufficiency 2013   ECHO 2013 was normal. LFT's, creatinine, TSH all normal. Alb a bit low.   COPD (chronic obstructive pulmonary disease) ([place]    Per pt, he has a diagnosis of COPD. No PFT's. Uses Alb MDI less than once a month.   Diabetes mellitus    Non-insulin dependent Type II.   Edema 04/19/2011   ECHO 2013 was normal. LFT's, creatinine, TSH all normal. Alb a bit low. Likely 2/2 chronic venous insuff 2/2 weight.   Elevated liver function tests 05/17/2011   04/2010. Increased GGT (pt uses ETOH). AMA negative. ABD U/S limited but otherwise negative. Follow Alk phos level. AST & ALT also elevated. False + IgM Hep B Core Ab. Hep B viral load negative.   Same pattern during hospitalization 2010 : highest alkaline phosphatase was 355, AST 259, and ALT 871. ANA, rheumatoid factor, ceruloplasmin, CMV IgM, alpha antitrypsin, and AMA, all within normal limits.   Encounter for power mobility device assessment 03/14/2020   Erythrocytosis 07/04/2016   Gout    Pt has never had a crystal diagnosis.   Hepatitis B antibody positive 2013   IgM Hep B core Ab + but Hepatitis B viral load negative.    Hyperlipidemia    At goal of LDL <100 with statin.  Hypertension    ACEI monotherapy   Leukocytosis 01/11/2014   Since 2010. Smear 2015 mild left shift. Is on prednisone for RA but increase started before leukocytosis.    NASH (nonalcoholic steatohepatitis), ? some EtOH contribution and fibrosis  05/17/2011   Elevated since 05/2008 during hospitalization. Relatively stable. Extensive W/U and no etiology but likely fatty liver 2/2 obesity despite normal imaging.  Hepatitis - all negative. There was an initial + IgM Hep B core Ab but repeat testing was negative and Hep B  viral load was negative.  TTG 06/15/2013  ANA x 2 (05/07/11 and 06/13/2008) ASMA x2 (07/18/2011 negative & weakly + at 21 on 05/27/2011 & nega   OA (osteoarthritis)    Obesity, Class III, BMI 40-49.9 (morbid obesity) ([place])    Obstructive sleep apnea 08/21/08   Sleep study AHI 16.4 with desat to 66%. CPAP of 17 decreased AHI to 0.9. Non-compliant with CPAP.   RA (rheumatoid arthritis) ([place]) 2013   Shortness of breath    Tobacco abuse    Per pt, he has a diagnosis of COPD. Need to locate PFT's.   Wheelchair bound    Past Surgical History:  Procedure Laterality Date   ESOPHAGOGASTRODUODENOSCOPY (EGD) WITH PROPOFOL N/A 05/08/2022   Procedure: ESOPHAGOGASTRODUODENOSCOPY (EGD) WITH PROPOFOL;  Surgeon: Daryel November, MD;  Location: [facility];  Service: Gastroenterology;  Laterality: N/A;   IR PARACENTESIS  05/09/2022   LAMINECTOMY  1995   L4-L5   TONSILLECTOMY     TOTAL HIP ARTHROPLASTY Left 2009     A IV Location/Drains/Wounds Patient Lines/Drains/Airways Status     Active Line/Drains/Airways     Name Placement date Placement time Site Days   Peripheral IV 05/30/22 18 G Left Antecubital 05/30/22  2213  Antecubital  1   Pressure Ulcer 06/13/11 Stage II -  Partial thickness loss of dermis presenting as a shallow open ulcer with a red, pink wound bed without slough. 06/13/11  1814  -- 4005            Intake/Output Last 24 hours No intake or output data in the 24 hours ending 05/31/22 0305  Labs/Imaging Results for orders placed or performed during the hospital encounter of 05/30/22 (from the past 48 hour(s))  Basic metabolic panel     Status: Abnormal   Collection Time: 05/30/22  9:50 PM  Result Value Ref Range   Sodium 124 (L) 135 - 145 mmol/L   Potassium 4.9 3.5 - 5.1 mmol/L    Comment: HEMOLYSIS AT THIS LEVEL MAY AFFECT RESULT   Chloride 94 (L) 98 - 111 mmol/L   CO2 20 (L) 22 - 32 mmol/L   Glucose, Bld 153 (H) 70 - 99 mg/dL    Comment: Glucose reference range applies  only to samples taken after fasting for at least 8 hours.   BUN 41 (H) 8 - 23 mg/dL   Creatinine, Ser 2.24 (H) 0.61 - 1.24 mg/dL   Calcium 8.0 (L) 8.9 - 10.3 mg/dL   GFR, Estimated 33 (L) >60 mL/min    Comment: (NOTE) Calculated using the CKD-EPI Creatinine Equation (2021)    Anion gap 10 5 - 15    Comment: Performed at [facility] [address] [place], [place] [address]  CBC with Differential     Status: Abnormal   Collection Time: 05/30/22  9:50 PM  Result Value Ref Range   WBC 12.5 (H) 4.0 - 10.5 K/uL   RBC 3.79 (L) 4.22 - 5.81 MIL/uL  Hemoglobin 12.4 (L) 13.0 - 17.0 g/dL   HCT 35.3 (L) 39.0 - 52.0 %   MCV 93.1 80.0 - 100.0 fL   MCH 32.7 26.0 - 34.0 pg   MCHC 35.1 30.0 - 36.0 g/dL   RDW 18.6 (H) 11.5 - 15.5 %   Platelets 104 (L) 150 - 400 K/uL   nRBC 0.0 0.0 - 0.2 %   Neutrophils Relative % 84 %   Neutro Abs 10.4 (H) 1.7 - 7.7 K/uL   Lymphocytes Relative 9 %   Lymphs Abs 1.1 0.7 - 4.0 K/uL   Monocytes Relative 7 %   Monocytes Absolute 0.9 0.1 - 1.0 K/uL   Eosinophils Relative 0 %   Eosinophils Absolute 0.0 0.0 - 0.5 K/uL   Basophils Relative 0 %   Basophils Absolute 0.0 0.0 - 0.1 K/uL   Immature Granulocytes 0 %   Abs Immature Granulocytes 0.05 0.00 - 0.07 K/uL    Comment: Performed at [facility] [address] [place], [place] [address]  Brain natriuretic peptide     Status: None   Collection Time: 05/30/22  9:50 PM  Result Value Ref Range   B Natriuretic Peptide 56.4 0.0 - 100.0 pg/mL    Comment: Performed at [facility] [address] [place], [place] [address]  Troponin I (High Sensitivity)     Status: None   Collection Time: 05/30/22  9:50 PM  Result Value Ref Range   Troponin I (High Sensitivity) 17 <18 ng/L    Comment: (NOTE) Elevated high sensitivity troponin I (hsTnI) values and significant  changes across serial measurements may suggest ACS but many other  chronic and acute conditions are known to elevate hsTnI results.   Refer to the "Links" section for chest pain algorithms and additional  guidance. Performed at [facility], [place] [address], [place], [place] [address]   Lipase, blood     Status: Abnormal   Collection Time: 05/30/22  9:50 PM  Result Value Ref Range   Lipase 58 (H) 11 - 51 U/L    Comment: Performed at [facility] [address] [place], [place] [address]  Hepatic function panel     Status: Abnormal   Collection Time: 05/30/22  9:50 PM  Result Value Ref Range   Total Protein 5.5 (L) 6.5 - 8.1 g/dL   Albumin 1.7 (L) 3.5 - 5.0 g/dL   AST 99 (H) 15 - 41 U/L    Comment: HEMOLYSIS AT THIS LEVEL MAY AFFECT RESULT   ALT 72 (H) 0 - 44 U/L    Comment: HEMOLYSIS AT THIS LEVEL MAY AFFECT RESULT   Alkaline Phosphatase 449 (H) 38 - 126 U/L   Total Bilirubin 5.6 (H) 0.3 - 1.2 mg/dL    Comment: HEMOLYSIS AT THIS LEVEL MAY AFFECT RESULT   Bilirubin, Direct 2.6 (H) 0.0 - 0.2 mg/dL   Indirect Bilirubin 3.0 (H) 0.3 - 0.9 mg/dL    Comment: Performed at [facility] [address], [place], [place] [address]  Lactic acid, plasma     Status: Abnormal   Collection Time: 05/30/22 10:09 PM  Result Value Ref Range   Lactic Acid, Venous 4.2 (HH) 0.5 - 1.9 mmol/L    Comment: CRITICAL RESULT CALLED TO, READ BACK BY AND VERIFIED WITH Leonides Schanz, S RN 05/30/22 2258 AMIREHSANI F Performed at [facility], [place] [address] [place], [place] [address]   Lactic acid, plasma     Status: Abnormal  Collection Time: 05/30/22 11:28 PM  Result Value Ref Range   Lactic Acid, Venous 2.9 (HH) 0.5 - 1.9 mmol/L    Comment: CRITICAL VALUE NOTED. VALUE IS CONSISTENT WITH PREVIOUSLY REPORTED/CALLED VALUE Performed at [facility], [place] [address], [place], [place] [address]   Protime-INR     Status: Abnormal   Collection Time: 05/30/22 11:28 PM  Result Value Ref Range   Prothrombin Time 15.3 (H) 11.4 - 15.2 seconds   INR 1.2 0.8 - 1.2    Comment: (NOTE) INR goal varies based on device and  disease states. Performed at [facility], [place] [address], [place], [place] [address]   Ammonia     Status: Abnormal   Collection Time: 05/30/22 11:28 PM  Result Value Ref Range   Ammonia 99 (H) 9 - 35 umol/L    Comment: Performed at [facility], [place] [address] [place], [place] [address]  Troponin I (High Sensitivity)     Status: None   Collection Time: 05/30/22 11:28 PM  Result Value Ref Range   Troponin I (High Sensitivity) 17 <18 ng/L    Comment: (NOTE) Elevated high sensitivity troponin I (hsTnI) values and significant  changes across serial measurements may suggest ACS but many other  chronic and acute conditions are known to elevate hsTnI results.  Refer to the "Links" section for chest pain algorithms and additional  guidance. Performed at [facility], [place] [address], [place], [place] [address]    US Abdomen Limited RUQ (LIVER/GB)  Result Date: 05/31/2022 CLINICAL DATA:  Ascites, cirrhosis EXAM: ULTRASOUND ABDOMEN LIMITED RIGHT UPPER QUADRANT COMPARISON:  CT 05/08/2022 FINDINGS: Gallbladder: Multiple small echogenic foci noted along the gallbladder wall. When compared to prior CT, these appear to reflect gallbladder wall calcifications. Some of these may reflect small stones. Gallbladder wall slightly thickened at 3.6 mm. Common bile duct: Diameter: Normal caliber, 3 mm Liver: Nodular shrunken liver compatible with cirrhosis. No focal hepatic abnormality. Portal vein is patent on color Doppler imaging with normal direction of blood flow towards the liver. Other: Right pleural effusion and perihepatic ascites noted. IMPRESSION: Changes of cirrhosis.  Associated perihepatic ascites. Gallbladder wall calcifications and possible small stones. Slight gallbladder wall thickening, likely related to liver disease. Right pleural effusion. Electronically Signed   By: Rolm Baptise M.D.   On: 05/31/2022 01:07   DG Chest Port 1 View  Result Date: 05/30/2022 CLINICAL DATA:   Shortness of breath EXAM: PORTABLE CHEST 1 VIEW COMPARISON:  05/02/2022 FINDINGS: Elevation of the right hemidiaphragm. Heart is mildly large. Mediastinal contours within normal limits. Continued perihilar opacities, right greater than left, similar prior study. No effusions or acute bony abnormality. IMPRESSION: Perihilar opacities, right greater than left again noted, similar to prior study. Findings could reflect edema or infection. Electronically Signed   By: Rolm Baptise M.D.   On: 05/30/2022 22:07    Pending Labs Unresulted Labs (From admission, onward)     Start     Ordered   05/31/22 0500  Comprehensive metabolic panel  Tomorrow morning,   R        05/31/22 0020   05/31/22 0500  CBC  Tomorrow morning,   R        05/31/22 0020   05/31/22 0500  Protime-INR  Once,   R        05/31/22 0112   05/31/22 0057  Expectorated Sputum Assessment w Gram Stain, Rflx to Resp Cult  Once,   R  05/31/22 0056   05/31/22 0051  Resp panel by RT-PCR (RSV, Flu A&B, Covid) Anterior Nasal Swab  (Tier 2 - SymptomaticResp panel by RT-PCR (RSV, Flu A&B, Covid))  Once,   R        05/31/22 0050   05/31/22 0051  Lactic acid, plasma  STAT Now then every 3 hours,   R (with STAT occurrences)      05/31/22 0051            Vitals/Pain Today's Vitals   05/31/22 0215 05/31/22 0230 05/31/22 0245 05/31/22 0305  BP: (!) 108/59 (!) 118/52 (!) 98/51   Pulse: (!) 106 (!) 105 (!) 105   Resp: '20 17 18   '$ Temp:    98.4 F (36.9 C)  SpO2: 94% 93% 94%   Weight:      PainSc:        Isolation Precautions Airborne and Contact precautions  Medications Medications  enoxaparin (LOVENOX) injection 80 mg (has no administration in time range)  ALPRAZolam (XANAX) tablet 0.5 mg (has no administration in time range)  atorvastatin (LIPITOR) tablet 40 mg (has no administration in time range)  HYDROcodone-acetaminophen (NORCO) 10-325 MG per tablet 1 tablet (has no administration in time range)  nicotine (NICODERM CQ -  dosed in mg/24 hours) patch 14 mg (has no administration in time range)  pantoprazole (PROTONIX) EC tablet 40 mg (has no administration in time range)  spironolactone (ALDACTONE) tablet 200 mg (has no administration in time range)  arformoterol (BROVANA) nebulizer solution 15 mcg (has no administration in time range)    And  umeclidinium bromide (INCRUSE ELLIPTA) 62.5 MCG/ACT 1 puff (has no administration in time range)  predniSONE (DELTASONE) tablet 40 mg (has no administration in time range)  ipratropium-albuterol (DUONEB) 0.5-2.5 (3) MG/3ML nebulizer solution 3 mL (has no administration in time range)  azithromycin (ZITHROMAX) tablet 500 mg (has no administration in time range)    Followed by  azithromycin (ZITHROMAX) tablet 250 mg (has no administration in time range)  cefTRIAXone (ROCEPHIN) 1 g in sodium chloride 0.9 % 100 mL IVPB (has no administration in time range)  furosemide (LASIX) injection 40 mg (40 mg Intravenous Given 05/30/22 2213)    Mobility non-ambulatory     Focused Assessments Pt is CHF/COPD/Fluid in abdomen   , Pulmonary Assessment Handoff:  Lung sounds: L Breath Sounds: Inspiratory wheezes R Breath Sounds: Inspiratory wheezes        R Recommendations: See Admitting Provider Note  Report given to:   Additional Notes:  Call [phone] or EPIC message for any additional question

## 2022-05-31 NOTE — Progress Notes (Signed)
Pt. C/o inability to void and lower abdominal discomfort bladder area, pt. States he voided last at around 1830, bladder scan showed 0 ml, will cont. To monitor  Andrew Fowler

## 2022-06-01 ENCOUNTER — Inpatient Hospital Stay (HOSPITAL_COMMUNITY): Payer: Medicaid Other

## 2022-06-01 ENCOUNTER — Other Ambulatory Visit: Payer: Self-pay

## 2022-06-01 DIAGNOSIS — J9601 Acute respiratory failure with hypoxia: Secondary | ICD-10-CM | POA: Insufficient documentation

## 2022-06-01 DIAGNOSIS — N179 Acute kidney failure, unspecified: Secondary | ICD-10-CM

## 2022-06-01 DIAGNOSIS — K7290 Hepatic failure, unspecified without coma: Secondary | ICD-10-CM

## 2022-06-01 DIAGNOSIS — K7460 Unspecified cirrhosis of liver: Secondary | ICD-10-CM | POA: Diagnosis not present

## 2022-06-01 LAB — CBC WITH DIFFERENTIAL/PLATELET
Abs Immature Granulocytes: 0.2 10*3/uL — ABNORMAL HIGH (ref 0.00–0.07)
Basophils Absolute: 0 10*3/uL (ref 0.0–0.1)
Basophils Relative: 0 %
Eosinophils Absolute: 0 10*3/uL (ref 0.0–0.5)
Eosinophils Relative: 0 %
HCT: 34.2 % — ABNORMAL LOW (ref 39.0–52.0)
Hemoglobin: 12.6 g/dL — ABNORMAL LOW (ref 13.0–17.0)
Immature Granulocytes: 1 %
Lymphocytes Relative: 5 %
Lymphs Abs: 1 10*3/uL (ref 0.7–4.0)
MCH: 33.1 pg (ref 26.0–34.0)
MCHC: 36.8 g/dL — ABNORMAL HIGH (ref 30.0–36.0)
MCV: 89.8 fL (ref 80.0–100.0)
Monocytes Absolute: 1.9 10*3/uL — ABNORMAL HIGH (ref 0.1–1.0)
Monocytes Relative: 10 %
Neutro Abs: 15.3 10*3/uL — ABNORMAL HIGH (ref 1.7–7.7)
Neutrophils Relative %: 84 %
Platelets: 100 10*3/uL — ABNORMAL LOW (ref 150–400)
RBC: 3.81 MIL/uL — ABNORMAL LOW (ref 4.22–5.81)
RDW: 18.6 % — ABNORMAL HIGH (ref 11.5–15.5)
WBC: 18.4 10*3/uL — ABNORMAL HIGH (ref 4.0–10.5)
nRBC: 0 % (ref 0.0–0.2)

## 2022-06-01 LAB — COMPREHENSIVE METABOLIC PANEL
ALT: 61 U/L — ABNORMAL HIGH (ref 0–44)
AST: 75 U/L — ABNORMAL HIGH (ref 15–41)
Albumin: 3.2 g/dL — ABNORMAL LOW (ref 3.5–5.0)
Alkaline Phosphatase: 406 U/L — ABNORMAL HIGH (ref 38–126)
Anion gap: 14 (ref 5–15)
BUN: 54 mg/dL — ABNORMAL HIGH (ref 8–23)
CO2: 19 mmol/L — ABNORMAL LOW (ref 22–32)
Calcium: 8.9 mg/dL (ref 8.9–10.3)
Chloride: 92 mmol/L — ABNORMAL LOW (ref 98–111)
Creatinine, Ser: 2.32 mg/dL — ABNORMAL HIGH (ref 0.61–1.24)
GFR, Estimated: 31 mL/min — ABNORMAL LOW (ref >60)
Glucose, Bld: 162 mg/dL — ABNORMAL HIGH (ref 70–99)
Potassium: 4.8 mmol/L (ref 3.5–5.1)
Sodium: 125 mmol/L — ABNORMAL LOW (ref 135–145)
Total Bilirubin: 5.4 mg/dL — ABNORMAL HIGH (ref 0.3–1.2)
Total Protein: 6.7 g/dL (ref 6.5–8.1)

## 2022-06-01 LAB — RESPIRATORY PANEL BY PCR

## 2022-06-01 LAB — POCT I-STAT 7, (LYTES, BLD GAS, ICA,H+H)
Acid-base deficit: 5 mmol/L — ABNORMAL HIGH (ref 0.0–2.0)
Acid-base deficit: 5 mmol/L — ABNORMAL HIGH (ref 0.0–2.0)
Bicarbonate: 23.2 mmol/L (ref 20.0–28.0)
Bicarbonate: 19.8 mmol/L — ABNORMAL LOW (ref 20.0–28.0)
Calcium, Ion: 1.17 mmol/L (ref 1.15–1.40)
Calcium, Ion: 1.14 mmol/L — ABNORMAL LOW (ref 1.15–1.40)
HCT: 38 % — ABNORMAL LOW (ref 39.0–52.0)
HCT: 37 % — ABNORMAL LOW (ref 39.0–52.0)
Hemoglobin: 12.9 g/dL — ABNORMAL LOW (ref 13.0–17.0)
Hemoglobin: 12.6 g/dL — ABNORMAL LOW (ref 13.0–17.0)
O2 Saturation: 100 %
O2 Saturation: 96 %
Patient temperature: 97.4
Patient temperature: 97.4
Potassium: 4.8 mmol/L (ref 3.5–5.1)
Potassium: 4.7 mmol/L (ref 3.5–5.1)
Sodium: 125 mmol/L — ABNORMAL LOW (ref 135–145)
Sodium: 125 mmol/L — ABNORMAL LOW (ref 135–145)
TCO2: 25 mmol/L (ref 22–32)
TCO2: 21 mmol/L — ABNORMAL LOW (ref 22–32)
pCO2 arterial: 54.3 mmHg — ABNORMAL HIGH (ref 32–48)
pCO2 arterial: 35.9 mmHg (ref 32–48)
pH, Arterial: 7.236 — ABNORMAL LOW (ref 7.35–7.45)
pH, Arterial: 7.347 — ABNORMAL LOW (ref 7.35–7.45)
pO2, Arterial: 240 mmHg — ABNORMAL HIGH (ref 83–108)
pO2, Arterial: 84 mmHg (ref 83–108)

## 2022-06-01 LAB — BLOOD GAS, ARTERIAL
Acid-base deficit: 5.1 mmol/L — ABNORMAL HIGH (ref 0.0–2.0)
Bicarbonate: 21.2 mmol/L (ref 20.0–28.0)
O2 Saturation: 99 %
Patient temperature: 36.4
pCO2 arterial: 42 mmHg (ref 32–48)
pH, Arterial: 7.31 — ABNORMAL LOW (ref 7.35–7.45)
pO2, Arterial: 111 mmHg — ABNORMAL HIGH (ref 83–108)

## 2022-06-01 LAB — PHOSPHORUS
Phosphorus: 5.6 mg/dL — ABNORMAL HIGH (ref 2.5–4.6)
Phosphorus: 6.2 mg/dL — ABNORMAL HIGH (ref 2.5–4.6)

## 2022-06-01 LAB — MRSA NEXT GEN BY PCR, NASAL: MRSA by PCR Next Gen: NOT DETECTED

## 2022-06-01 LAB — GLUCOSE, CAPILLARY
Glucose-Capillary: 161 mg/dL — ABNORMAL HIGH (ref 70–99)
Glucose-Capillary: 196 mg/dL — ABNORMAL HIGH (ref 70–99)
Glucose-Capillary: 237 mg/dL — ABNORMAL HIGH (ref 70–99)
Glucose-Capillary: 271 mg/dL — ABNORMAL HIGH (ref 70–99)
Glucose-Capillary: 262 mg/dL — ABNORMAL HIGH (ref 70–99)
Glucose-Capillary: 263 mg/dL — ABNORMAL HIGH (ref 70–99)

## 2022-06-01 LAB — STREP PNEUMONIAE URINARY ANTIGEN: Strep Pneumo Urinary Antigen: NEGATIVE

## 2022-06-01 LAB — MAGNESIUM
Magnesium: 2.4 mg/dL (ref 1.7–2.4)
Magnesium: 2.3 mg/dL (ref 1.7–2.4)
Magnesium: 2.4 mg/dL (ref 1.7–2.4)

## 2022-06-01 MED ORDER — FUROSEMIDE 10 MG/ML IJ SOLN
40.0000 mg | Freq: Once | INTRAMUSCULAR | Status: AC
  Administered 2022-06-01: 40 mg via INTRAVENOUS
  Filled 2022-06-01: qty 4

## 2022-06-01 MED ORDER — ALBUMIN HUMAN 25 % IV SOLN
INTRAVENOUS | Status: AC
  Administered 2022-06-01: 100 g
  Filled 2022-06-01: qty 100

## 2022-06-01 MED ORDER — EMPTY CONTAINERS FLEXIBLE MISC
Freq: Once | Status: AC
  Filled 2022-06-01 (×2): qty 400

## 2022-06-01 MED ORDER — NOREPINEPHRINE 4 MG/250ML-% IV SOLN
2.0000 ug/min | INTRAVENOUS | Status: DC
  Administered 2022-06-01: 10 ug/min via INTRAVENOUS
  Filled 2022-06-01: qty 250

## 2022-06-01 MED ORDER — FENTANYL CITRATE PF 50 MCG/ML IJ SOSY
50.0000 ug | PREFILLED_SYRINGE | INTRAMUSCULAR | Status: DC | PRN
  Administered 2022-06-01: 25 ug via INTRAVENOUS
  Administered 2022-06-01: 50 ug via INTRAVENOUS
  Administered 2022-06-01: 100 ug via INTRAVENOUS
  Administered 2022-06-01 – 2022-06-02 (×2): 50 ug via INTRAVENOUS
  Administered 2022-06-02: 150 ug via INTRAVENOUS
  Administered 2022-06-02: 50 ug via INTRAVENOUS
  Administered 2022-06-02 (×3): 100 ug via INTRAVENOUS
  Filled 2022-06-01: qty 1
  Filled 2022-06-01: qty 2
  Filled 2022-06-01: qty 1
  Filled 2022-06-01 (×2): qty 4
  Filled 2022-06-01 (×2): qty 3

## 2022-06-01 MED ORDER — NOREPINEPHRINE 4 MG/250ML-% IV SOLN
INTRAVENOUS | Status: AC
  Filled 2022-06-01: qty 250

## 2022-06-01 MED ORDER — ROCURONIUM BROMIDE 10 MG/ML (PF) SYRINGE
PREFILLED_SYRINGE | INTRAVENOUS | Status: AC
  Administered 2022-06-01: 70 mg
  Filled 2022-06-01: qty 10

## 2022-06-01 MED ORDER — SODIUM CHLORIDE 0.9 % IV SOLN
1.0000 g | Freq: Every day | INTRAVENOUS | Status: DC
  Administered 2022-06-01: 1 g via INTRAVENOUS
  Filled 2022-06-01: qty 10

## 2022-06-01 MED ORDER — PANTOPRAZOLE SODIUM 40 MG IV SOLR
40.0000 mg | Freq: Every day | INTRAVENOUS | Status: DC
  Administered 2022-06-01 – 2022-06-07 (×7): 40 mg via INTRAVENOUS
  Filled 2022-06-01 (×7): qty 10

## 2022-06-01 MED ORDER — HYDROCODONE-ACETAMINOPHEN 5-325 MG PO TABS: 2.0000 | ORAL_TABLET | Freq: Four times a day (QID) | ORAL | Status: DC | PRN

## 2022-06-01 MED ORDER — SODIUM CHLORIDE 0.9 % IV SOLN
1.0000 g | Freq: Once | INTRAVENOUS | Status: AC
  Administered 2022-06-01: 1 g via INTRAVENOUS
  Filled 2022-06-01: qty 10

## 2022-06-01 MED ORDER — POLYETHYLENE GLYCOL 3350 17 G PO PACK
17.0000 g | PACK | Freq: Every day | ORAL | Status: DC
  Administered 2022-06-01 – 2022-06-02 (×2): 17 g
  Filled 2022-06-01 (×2): qty 1

## 2022-06-01 MED ORDER — ORAL CARE MOUTH RINSE: 15.0000 mL | OROMUCOSAL | Status: DC | PRN

## 2022-06-01 MED ORDER — FOLIC ACID 1 MG PO TABS
1.0000 mg | ORAL_TABLET | Freq: Every day | ORAL | Status: DC
  Administered 2022-06-01 – 2022-06-07 (×7): 1 mg
  Filled 2022-06-01 (×7): qty 1

## 2022-06-01 MED ORDER — SODIUM CHLORIDE 0.9 % IV SOLN
250.0000 mL | INTRAVENOUS | Status: DC
  Administered 2022-06-01: 250 mL via INTRAVENOUS

## 2022-06-01 MED ORDER — FENTANYL CITRATE PF 50 MCG/ML IJ SOSY
PREFILLED_SYRINGE | INTRAMUSCULAR | Status: AC
  Administered 2022-06-01: 50 ug
  Filled 2022-06-01: qty 2

## 2022-06-01 MED ORDER — ETOMIDATE 2 MG/ML IV SOLN
INTRAVENOUS | Status: AC
  Filled 2022-06-01: qty 20

## 2022-06-01 MED ORDER — FENTANYL CITRATE PF 50 MCG/ML IJ SOSY
50.0000 ug | PREFILLED_SYRINGE | INTRAMUSCULAR | Status: DC | PRN
  Administered 2022-06-02 (×2): 50 ug via INTRAVENOUS
  Filled 2022-06-01 (×3): qty 1

## 2022-06-01 MED ORDER — ADULT MULTIVITAMIN W/MINERALS CH
1.0000 | ORAL_TABLET | Freq: Every day | ORAL | Status: DC
  Administered 2022-06-01 – 2022-06-07 (×7): 1
  Filled 2022-06-01 (×7): qty 1

## 2022-06-01 MED ORDER — MIDAZOLAM HCL 2 MG/2ML IJ SOLN
INTRAMUSCULAR | Status: AC
  Administered 2022-06-01: 1 mg
  Filled 2022-06-01: qty 2

## 2022-06-01 MED ORDER — PHENYLEPHRINE 80 MCG/ML (10ML) SYRINGE FOR IV PUSH (FOR BLOOD PRESSURE SUPPORT)
PREFILLED_SYRINGE | INTRAVENOUS | Status: AC
  Filled 2022-06-01: qty 10

## 2022-06-01 MED ORDER — DEXMEDETOMIDINE HCL IN NACL 400 MCG/100ML IV SOLN
0.0000 ug/kg/h | INTRAVENOUS | Status: DC
  Administered 2022-06-01: 1.2 ug/kg/h via INTRAVENOUS
  Administered 2022-06-01: 0.4 ug/kg/h via INTRAVENOUS
  Administered 2022-06-01: 1.2 ug/kg/h via INTRAVENOUS
  Administered 2022-06-01: 1 ug/kg/h via INTRAVENOUS
  Administered 2022-06-01: 1.2 ug/kg/h via INTRAVENOUS
  Administered 2022-06-02: 1 ug/kg/h via INTRAVENOUS
  Administered 2022-06-02: 1.2 ug/kg/h via INTRAVENOUS
  Administered 2022-06-02: 1.5 ug/kg/h via INTRAVENOUS
  Administered 2022-06-02 (×2): 1.2 ug/kg/h via INTRAVENOUS
  Administered 2022-06-02: 1.5 ug/kg/h via INTRAVENOUS
  Administered 2022-06-03: 1 ug/kg/h via INTRAVENOUS
  Administered 2022-06-03: 0.5 ug/kg/h via INTRAVENOUS
  Administered 2022-06-03: 1 ug/kg/h via INTRAVENOUS
  Administered 2022-06-03: 0.4 ug/kg/h via INTRAVENOUS
  Administered 2022-06-03: 0.9 ug/kg/h via INTRAVENOUS
  Administered 2022-06-03: 0.8 ug/kg/h via INTRAVENOUS
  Administered 2022-06-04: 0.5 ug/kg/h via INTRAVENOUS
  Administered 2022-06-04: 0.9 ug/kg/h via INTRAVENOUS
  Administered 2022-06-04: 1 ug/kg/h via INTRAVENOUS
  Administered 2022-06-04: 0.5 ug/kg/h via INTRAVENOUS
  Administered 2022-06-04 (×2): 1.4 ug/kg/h via INTRAVENOUS
  Administered 2022-06-05: 0.5 ug/kg/h via INTRAVENOUS
  Administered 2022-06-05: 0.6 ug/kg/h via INTRAVENOUS
  Administered 2022-06-05 (×2): 0.5 ug/kg/h via INTRAVENOUS
  Administered 2022-06-05: 0.7 ug/kg/h via INTRAVENOUS
  Administered 2022-06-06 (×3): 0.6 ug/kg/h via INTRAVENOUS
  Administered 2022-06-06: 0.4 ug/kg/h via INTRAVENOUS
  Administered 2022-06-06 (×2): 1.2 ug/kg/h via INTRAVENOUS
  Administered 2022-06-07 (×3): 0.8 ug/kg/h via INTRAVENOUS
  Administered 2022-06-07: 0.6 ug/kg/h via INTRAVENOUS
  Administered 2022-06-07: 1 ug/kg/h via INTRAVENOUS
  Administered 2022-06-07: 0.6 ug/kg/h via INTRAVENOUS
  Administered 2022-06-07: 1 ug/kg/h via INTRAVENOUS
  Administered 2022-06-08 (×3): 0.8 ug/kg/h via INTRAVENOUS
  Filled 2022-06-01 (×7): qty 100
  Filled 2022-06-01: qty 200
  Filled 2022-06-01 (×21): qty 100
  Filled 2022-06-01 (×3): qty 200
  Filled 2022-06-01 (×3): qty 100
  Filled 2022-06-01: qty 200
  Filled 2022-06-01 (×4): qty 100

## 2022-06-01 MED ORDER — ATORVASTATIN CALCIUM 40 MG PO TABS
40.0000 mg | ORAL_TABLET | Freq: Every day | ORAL | Status: DC
  Administered 2022-06-01 – 2022-06-07 (×7): 40 mg
  Filled 2022-06-01 (×7): qty 1

## 2022-06-01 MED ORDER — ARTIFICIAL TEARS OPHTHALMIC OINT
TOPICAL_OINTMENT | Freq: Three times a day (TID) | OPHTHALMIC | Status: DC
  Administered 2022-06-05 – 2022-06-06 (×3): 1 via OPHTHALMIC
  Administered 2022-06-07 – 2022-06-08 (×3): 2 via OPHTHALMIC
  Filled 2022-06-01 (×2): qty 3.5

## 2022-06-01 MED ORDER — MIDAZOLAM HCL 2 MG/2ML IJ SOLN
2.0000 mg | Freq: Once | INTRAMUSCULAR | Status: AC
  Administered 2022-06-02: 2 mg via INTRAVENOUS
  Filled 2022-06-01: qty 2

## 2022-06-01 MED ORDER — NOREPINEPHRINE 4 MG/250ML-% IV SOLN
0.0000 ug/min | INTRAVENOUS | Status: DC
  Administered 2022-06-01: 5 ug/min via INTRAVENOUS
  Administered 2022-06-02: 2 ug/min via INTRAVENOUS
  Administered 2022-06-02 – 2022-06-04 (×2): 5 ug/min via INTRAVENOUS
  Administered 2022-06-05: 6 ug/min via INTRAVENOUS
  Administered 2022-06-06: 2 ug/min via INTRAVENOUS
  Administered 2022-06-07: 6 ug/min via INTRAVENOUS
  Administered 2022-06-07: 28 ug/min via INTRAVENOUS
  Administered 2022-06-07: 30 ug/min via INTRAVENOUS
  Administered 2022-06-07: 25 ug/min via INTRAVENOUS
  Filled 2022-06-01 (×10): qty 250

## 2022-06-01 MED ORDER — SODIUM CHLORIDE 0.9 % IV SOLN
2.0000 g | Freq: Every day | INTRAVENOUS | Status: AC
  Administered 2022-06-02 – 2022-06-04 (×3): 2 g via INTRAVENOUS
  Filled 2022-06-01 (×3): qty 20

## 2022-06-01 MED ORDER — PROSOURCE TF20 ENFIT COMPATIBL EN LIQD
60.0000 mL | Freq: Every day | ENTERAL | Status: DC
  Filled 2022-06-01: qty 60

## 2022-06-01 MED ORDER — ORAL CARE MOUTH RINSE
15.0000 mL | OROMUCOSAL | Status: DC
  Administered 2022-06-01 – 2022-06-08 (×76): 15 mL via OROMUCOSAL

## 2022-06-01 MED ORDER — MIDAZOLAM HCL 2 MG/2ML IJ SOLN
INTRAMUSCULAR | Status: AC
  Filled 2022-06-01: qty 2

## 2022-06-01 MED ORDER — VITAL HIGH PROTEIN PO LIQD: 1000.0000 mL | ORAL | Status: DC

## 2022-06-01 MED ORDER — CHLORHEXIDINE GLUCONATE CLOTH 2 % EX PADS
6.0000 | MEDICATED_PAD | Freq: Every day | CUTANEOUS | Status: DC
  Administered 2022-06-01 – 2022-06-08 (×8): 6 via TOPICAL

## 2022-06-01 MED ORDER — INSULIN ASPART 100 UNIT/ML IJ SOLN
0.0000 [IU] | INTRAMUSCULAR | Status: DC
  Administered 2022-06-01 (×2): 5 [IU] via SUBCUTANEOUS
  Administered 2022-06-02: 3 [IU] via SUBCUTANEOUS

## 2022-06-01 MED ORDER — AZITHROMYCIN 500 MG PO TABS
500.0000 mg | ORAL_TABLET | Freq: Every day | ORAL | Status: AC
  Administered 2022-06-01: 500 mg
  Filled 2022-06-01: qty 1

## 2022-06-01 MED ORDER — DEXMEDETOMIDINE HCL IN NACL 400 MCG/100ML IV SOLN: 0.1000 ug/kg/h | INTRAVENOUS | Status: DC

## 2022-06-01 MED ORDER — METHYLPREDNISOLONE SODIUM SUCC 125 MG IJ SOLR
80.0000 mg | Freq: Every day | INTRAMUSCULAR | Status: DC
  Administered 2022-06-01 – 2022-06-05 (×5): 80 mg via INTRAVENOUS
  Filled 2022-06-01 (×5): qty 2

## 2022-06-01 MED ORDER — MIDAZOLAM HCL 2 MG/2ML IJ SOLN
1.0000 mg | INTRAMUSCULAR | Status: DC | PRN
  Administered 2022-06-02 – 2022-06-06 (×4): 1 mg via INTRAVENOUS
  Filled 2022-06-01 (×7): qty 2

## 2022-06-01 MED ORDER — HEPARIN SODIUM (PORCINE) 5000 UNIT/ML IJ SOLN
5000.0000 [IU] | Freq: Three times a day (TID) | INTRAMUSCULAR | Status: DC
  Administered 2022-06-01 – 2022-06-06 (×15): 5000 [IU] via SUBCUTANEOUS
  Filled 2022-06-01 (×15): qty 1

## 2022-06-01 MED ORDER — ALBUMIN HUMAN 25 % IV SOLN
INTRAVENOUS | Status: AC
  Filled 2022-06-01: qty 400

## 2022-06-01 MED ORDER — DOCUSATE SODIUM 50 MG/5ML PO LIQD
100.0000 mg | Freq: Two times a day (BID) | ORAL | Status: DC
  Administered 2022-06-01 – 2022-06-07 (×14): 100 mg
  Filled 2022-06-01 (×14): qty 10

## 2022-06-01 MED ORDER — PROSOURCE TF20 ENFIT COMPATIBL EN LIQD
60.0000 mL | Freq: Three times a day (TID) | ENTERAL | Status: DC
  Administered 2022-06-01 – 2022-06-05 (×12): 60 mL
  Filled 2022-06-01 (×12): qty 60

## 2022-06-01 MED ORDER — LORAZEPAM 2 MG/ML IJ SOLN
1.0000 mg | Freq: Once | INTRAMUSCULAR | Status: AC | PRN
  Administered 2022-06-01: 1 mg via INTRAVENOUS
  Filled 2022-06-01: qty 1

## 2022-06-01 MED ORDER — ALBUMIN HUMAN 25 % IV SOLN
12.5000 g | Freq: Once | INTRAVENOUS | Status: AC
  Administered 2022-06-01: 12.5 g via INTRAVENOUS
  Filled 2022-06-01: qty 50

## 2022-06-01 MED ORDER — VITAL 1.5 CAL PO LIQD
1000.0000 mL | ORAL | Status: DC
  Administered 2022-06-01 – 2022-06-07 (×6): 1000 mL

## 2022-06-01 MED ORDER — THIAMINE HCL 100 MG/ML IJ SOLN
100.0000 mg | Freq: Every day | INTRAMUSCULAR | Status: DC
  Administered 2022-06-01 – 2022-06-02 (×2): 100 mg via INTRAVENOUS
  Filled 2022-06-01 (×3): qty 2

## 2022-06-01 NOTE — Procedures (Signed)
Intubation Procedure Note  CROSS GOLDBECK  [id number]  28-Jan-1961  Date:06/01/22  Time:7:54 AM   Provider Performing:Poet Hineman A Iniko Robles    Procedure: Intubation ([id number])  Indication(s) Respiratory Failure  Consent Unable to obtain consent due to emergent nature of procedure.   Anesthesia Etomidate, Versed, Fentanyl, and Rocuronium 20 etomidate, 10 Versed, 50 fentanyl, 70 rocuronium  Time Out Verified patient identification, verified procedure, site/side was marked, verified correct patient position, special equipment/implants available, medications/allergies/relevant history reviewed, required imaging and test results available.   Sterile Technique Usual hand hygeine, masks, and gloves were used   Procedure Description Patient positioned in bed supine.  Sedation given as noted above.  Patient was intubated with endotracheal tube using Glidescope.  View was Grade 1 full glottis .  Number of attempts was 1.  Colorimetric CO2 detector was consistent with tracheal placement.   Complications/Tolerance None; patient tolerated the procedure well. Chest X-ray is ordered to verify placement.   EBL Minimal   Specimen(s) None

## 2022-06-01 NOTE — Progress Notes (Signed)
Patient is on 8L HFNC. Patient is extremely agitated and refuses oxygen. Patient is currently alert and oriented x4. Patient has been ecucated about the importance of keeping oxygen on. At this time, patient refuses oxygen and wants to speak to [facility] MD about going home. Markus Jarvis MD paged.

## 2022-06-01 NOTE — Progress Notes (Addendum)
IMTS paged by RN 0200 for respiratory distress and hypoxia. Per RN, patient sat up on edge of bed, had labored breathing with SpO2 dropping to 76% on 2L [place]. Placed on NRB on 15L with improvement of SpO2 to mid-90s. Last breathing treatment was around midnight.   On arrival, patient lying in bed awake and alert. Endorses SOB and chest pain. Patient states he was anxious and sat up. Vital showed SBP 106, tachycardic 110s, RR 28, SpO2 90-94% on NRB 15L. On exam, increased work of breathing, able to speak in short sentences. Rhonchi noted bilaterally.   CXR showed worsening bilateral opacities compared to 3/7 CXR, no pneumothorax.   Plan -breathing improved once he was lying in bed, remains on NRB 15L -transfer orders for progressive care for closer monitoring -start Rocephin  -continue scheduled Duoneb q4h  -wean off NRB if tolerated, with SpO2 goal 88-92%   ---------------------------------------------- 0500 Addendum IMTS paged by RN for patient removing NRB.   Patient asking if he is able to remove the mask but educated patient to keep mask on to maintain his oxygen levels. He did receive an additional breathing treatment. Patient appeared anxious and requesting to remove mask. Patient given 1 mg IV ativan.   On exam, patient has increased work of breathing on 15L. Audible crackles heard with abdominal breathing and using accessory muscles. Vitals showed BP 126/61, SpO2 90-93% on 15L NRB. ABG relatively unremarkable with pH 7.31, pCO2 42, bicarb 21.   Educated patient to keep NRB mask on to maintain adequate oxygen levels. Patient agreed to adherence.  Plan -gave IV 40 mg Lasix once -on supplemental oxygen to maintain SpO2 88-92% -consider PCCM consult if no improvement  ---------------------------------------------- 0600 Addendum Patient continues to remove mask. PCCM consulted and they will see him.

## 2022-06-01 NOTE — Consult Note (Signed)
NAME:  Andrew Fowler, MRN:  [id number], DOB:  [birth year], LOS: 1 ADMISSION DATE:  05/30/2022, CONSULTATION DATE:  3/9 REFERRING MD:  Dr. Markus Jarvis, CHIEF COMPLAINT:  hypoxic resp failure   History of Present Illness:  Patient is a 62 year old male with pertinent PMH NASH cirrhosis, COPD, OSA, RA, T2DM, severe obesity presents to [facility] ED on 3/7 with SOB.  Patient states he has been having progressively worsening SOB over the past 10 days.  States he has noticed more wheezing and productive cough.  Denies fever/chills.  Also noticed more swelling in legs and body and admits to gaining weight.  States he has been taking his home medications faithfully.  On 3/7 came to [facility] ED for further eval.  Vital stable.  Sats 95% on room air.  Labs showing significant hyponatremia, AKI, elevated liver enzymes , low albumin, LA 4.2 then 2.9.  Chronic thrombocytopenia 104.  Patient was given IV Lasix and some breathing treatments which helped with breathing.  Started on steroids.  CXR showing perihilar opacities.  Started on rocephin/azithromycin. On 3/8 paracentesis was performed pulling 2.4L of clear yellow fluid.  Albumin given.  Spironolactone/Lasix held due to elevated creatinine.  On 3/9 patient with worsening respiratory distress at 2 AM.  Patient sats dropped to 76% on 2 LNC.  Patient was placed on NRB with improvement in sats.  CXR showing worsening infiltrate versus edema bilaterally.  Patient transferred to progressive and given Lasix.  Patient only put out 300 mL UOP.  ABG 7.31, 42, 111, 21.  Patient with sats only 88% on NRB and still in respiratory distress.  PCCM consulted.  Pertinent  Medical History   Past Medical History:  Diagnosis Date   Abnormal laboratory test result 07/04/2011   Immunofixation electrophoresis 3/13 showed slightly restricted mobility in the IgG and kappa lanes and suggested repeat end of year.    Anxiety associated with depression 04/19/2011   On chronic benzos and SSRI's. Gets  panic attacks. Does not see mental health.   Chronic pain syndrome 04/19/2011   Combination of RA, obesity, OA, & DDD. Has seen Dr Sharol Given & s/p R total hip arthroplasty 2/2 OA & ? AVN. s/p L4-L5 laminectomy. Lumbar MRI 4/11 : L3-L4 mod central canal narrowing.  Cervical MRI 6/11 : multi-level DDD and L foraminal stenosis at C4-5 and C5-6.    Chronic venous insufficiency 2013   ECHO 2013 was normal. LFT's, creatinine, TSH all normal. Alb a bit low.   COPD (chronic obstructive pulmonary disease) ([place])    Per pt, he has a diagnosis of COPD. No PFT's. Uses Alb MDI less than once a month.   Diabetes mellitus    Non-insulin dependent Type II.   Edema 04/19/2011   ECHO 2013 was normal. LFT's, creatinine, TSH all normal. Alb a bit low. Likely 2/2 chronic venous insuff 2/2 weight.   Elevated liver function tests 05/17/2011   04/2010. Increased GGT (pt uses ETOH). AMA negative. ABD U/S limited but otherwise negative. Follow Alk phos level. AST & ALT also elevated. False + IgM Hep B Core Ab. Hep B viral load negative.   Same pattern during hospitalization 2010 : highest alkaline phosphatase was 355, AST 259, and ALT 871. ANA, rheumatoid factor, ceruloplasmin, CMV IgM, alpha antitrypsin, and AMA, all within normal limits.   Encounter for power mobility device assessment 03/14/2020   Erythrocytosis 07/04/2016   Gout    Pt has never had a crystal diagnosis.   Hepatitis B antibody positive 2013  IgM Hep B core Ab + but Hepatitis B viral load negative.    Hyperlipidemia    At goal of LDL <100 with statin.   Hypertension    ACEI monotherapy   Leukocytosis 01/11/2014   Since 2010. Smear 2015 mild left shift. Is on prednisone for RA but increase started before leukocytosis.    NASH (nonalcoholic steatohepatitis), ? some EtOH contribution and fibrosis  05/17/2011   Elevated since 05/2008 during hospitalization. Relatively stable. Extensive W/U and no etiology but likely fatty liver 2/2 obesity despite normal imaging.   Hepatitis - all negative. There was an initial + IgM Hep B core Ab but repeat testing was negative and Hep B viral load was negative.  TTG 06/15/2013  ANA x 2 (05/07/11 and 06/13/2008) ASMA x2 (07/18/2011 negative & weakly + at 21 on 05/27/2011 & nega   OA (osteoarthritis)    Obesity, Class III, BMI 40-49.9 (morbid obesity) ([place])    Obstructive sleep apnea 08/21/08   Sleep study AHI 16.4 with desat to 66%. CPAP of 17 decreased AHI to 0.9. Non-compliant with CPAP.   RA (rheumatoid arthritis) ([place]) 2013   Shortness of breath    Tobacco abuse    Per pt, he has a diagnosis of COPD. Need to locate PFT's.   Wheelchair bound      Significant Hospital Events: Including procedures, antibiotic start and stop dates in addition to other pertinent events   3/7 admitted to [facility] for SOB; admitted to floors 3/9 worsening respiratory distress with hypoxia; transferred to ICU and started on BiPAP  Interim History / Subjective:  See above A.m. labs pending  Objective   Blood pressure (!) 109/54, pulse (!) 119, temperature 98.1 F (36.7 C), temperature source Oral, resp. rate (!) 28, weight (!) 155.5 kg, SpO2 93 %.        Intake/Output Summary (Last 24 hours) at 06/01/2022 0630 Last data filed at 06/01/2022 0000 Gross per 24 hour  Intake 1054.56 ml  Output 1000 ml  Net 54.56 ml   Filed Weights   05/30/22 2234 05/31/22 0334  Weight: (!) 155.5 kg (!) 155.5 kg    Examination: General:  critically ill appearing on NRB HEENT: MM pink/moist; NRB in place Neuro: alert but confused; asking to go home; follows some commands CV: s1s2, tachy 110s, no m/r/g PULM:  coarse rales/wheezing BS bilaterally; NRB sats 88% GI: distended, bsx4 active  Extremities: warm/dry, diffuse anasarca; 3+ edema BLE Skin: no rashes or lesions appreciated   Resolved Hospital Problem list     Assessment & Plan:   Acute respiratory failure with hypoxia -Likely due to pulmonary edema Possible CAP COPD Plan: -Transfer to  ICU -Will start on BiPAP -Wean FiO2 for sats greater than 92% -Will give an additional 40 Lasix with albumin; monitor UOP -Continue Rocephin/azithromycin for CAP Ppx -follow expectorated sputum -check urine legionella/strep, rvp -Switch prednisone to IV Solu-Medrol -Continue DuoNeb every 4; will hold maintenance LAMA/LABA with scheduled DuoNeb -PT/OT when appropriate  Decompensated cirrhosis secondary to NASH Hypoalbuminemia Hyponatremia Plan: -Will give Lasix with albumin this a.m. -Trend BMP -Hold on spironolactone for now  AKI Urinary retention Plan: -Giving Lasix/albumin -Bladder scan patient; consider placing Foley -Trend BMP / urinary output -Replace electrolytes as indicated -Avoid nephrotoxic agents, ensure adequate renal perfusion  RA: on plaquenil and steroids Plan: -continue IV steroids -hold plaquenil for now  Chronic thrombocytopenia Anemia Plan: -Trend CBC  HTN HLD Plan: -cont statin -hold spironolactone; continue iv lasix   Tobacco use disorder Plan: -  Continue nicotine   Best Practice (right click and "Reselect all SmartList Selections" daily)   Diet/type: NPO w/ oral meds DVT prophylaxis: prophylactic heparin  GI prophylaxis: PPI Lines: N/A Foley:  N/A Code Status:  full code Last date of multidisciplinary goals of care discussion [pending]  Labs   CBC: Recent Labs  Lab 05/30/22 2150 05/31/22 0444  WBC 12.5* 9.7  NEUTROABS 10.4*  --   HGB 12.4* 13.1  HCT 35.3* 34.9*  MCV 93.1 88.1  PLT 104* 82*    Basic Metabolic Panel: Recent Labs  Lab 05/30/22 2150 05/31/22 0444  NA 124* 125*  K 4.9 4.5  CL 94* 93*  CO2 20* 23  GLUCOSE 153* 160*  BUN 41* 44*  CREATININE 2.24* 2.16*  CALCIUM 8.0* 8.3*   GFR: Estimated Creatinine Clearance: 54.6 mL/min (A) (by C-G formula based on SCr of 2.16 mg/dL (H)). Recent Labs  Lab 05/30/22 2150 05/30/22 2209 05/30/22 2328 05/31/22 0444 05/31/22 0943  WBC 12.5*  --   --  9.7  --    LATICACIDVEN  --  4.2* 2.9* 2.0* 1.8    Liver Function Tests: Recent Labs  Lab 05/30/22 2150 05/31/22 0444  AST 99* 85*  ALT 72* 73*  ALKPHOS 449* 456*  BILITOT 5.6* 5.4*  PROT 5.5* 6.1*  ALBUMIN 1.7* 1.8*   Recent Labs  Lab 05/30/22 2150  LIPASE 58*   Recent Labs  Lab 05/30/22 2328  AMMONIA 99*    ABG    Component Value Date/Time   PHART 7.31 (L) 06/01/2022 0500   PCO2ART 42 06/01/2022 0500   PO2ART 111 (H) 06/01/2022 0500   HCO3 21.2 06/01/2022 0500   TCO2 20.9 05/15/2012 0450   ACIDBASEDEF 5.1 (H) 06/01/2022 0500   O2SAT 99 06/01/2022 0500     Coagulation Profile: Recent Labs  Lab 05/30/22 2328 05/31/22 0444  INR 1.2 1.3*    Cardiac Enzymes: No results for input(s): "CKTOTAL", "CKMB", "CKMBINDEX", "TROPONINI" in the last 168 hours.  HbA1C: Hemoglobin A1C  Date/Time Value Ref Range Status  03/05/2022 10:13 AM 6.1 (A) 4.0 - 5.6 % Final  07/09/2021 12:13 PM 5.4 4.0 - 5.6 % Final  09/07/2018 12:00 AM 6.5  Final   Hgb A1c MFr Bld  Date/Time Value Ref Range Status  05/16/2012 04:48 AM 8.7 (H) <5.7 % Final    Comment:    (NOTE)                                                                       According to the ADA Clinical Practice Recommendations for 2011, when HbA1c is used as a screening test:  >=6.5%   Diagnostic of Diabetes Mellitus           (if abnormal result is confirmed) 5.7-6.4%   Increased risk of developing Diabetes Mellitus References:Diagnosis and Classification of Diabetes Mellitus,Diabetes [id number] 1):S62-S69 and Standards of Medical Care in         Diabetes - 2011,Diabetes [id number] (Suppl 1):S11-S61.  05/06/2011 09:15 PM 6.1 (H) <5.7 % Final    Comment:    (NOTE)  According to the ADA Clinical Practice Recommendations for 2011, when HbA1c is used as a screening test:  >=6.5%   Diagnostic of Diabetes Mellitus           (if abnormal result is  confirmed) 5.7-6.4%   Increased risk of developing Diabetes Mellitus References:Diagnosis and Classification of Diabetes Mellitus,Diabetes [id number] 1):S62-S69 and Standards of Medical Care in         Diabetes - 2011,Diabetes [id number] (Suppl 1):S11-S61.    CBG: No results for input(s): "GLUCAP" in the last 168 hours.  Review of Systems:   Review of Systems  Constitutional:  Negative for fever.  Respiratory:  Positive for cough, sputum production, shortness of breath and wheezing.   Cardiovascular:  Negative for chest pain.  Gastrointestinal:  Negative for diarrhea, nausea and vomiting.     Past Medical History:  He,  has a past medical history of Abnormal laboratory test result (07/04/2011), Anxiety associated with depression (04/19/2011), Chronic pain syndrome (04/19/2011), Chronic venous insufficiency (2013), COPD (chronic obstructive pulmonary disease) ([place]), Diabetes mellitus, Edema (04/19/2011), Elevated liver function tests (05/17/2011), Encounter for power mobility device assessment (03/14/2020), Erythrocytosis (07/04/2016), Gout, Hepatitis B antibody positive (2013), Hyperlipidemia, Hypertension, Leukocytosis (01/11/2014), NASH (nonalcoholic steatohepatitis), ? some EtOH contribution and fibrosis  (05/17/2011), OA (osteoarthritis), Obesity, Class III, BMI 40-49.9 (morbid obesity) ([place]), Obstructive sleep apnea (08/21/08), RA (rheumatoid arthritis) ([place]) (2013), Shortness of breath, Tobacco abuse, and Wheelchair bound.   Surgical History:   Past Surgical History:  Procedure Laterality Date   ESOPHAGOGASTRODUODENOSCOPY (EGD) WITH PROPOFOL N/A 05/08/2022   Procedure: ESOPHAGOGASTRODUODENOSCOPY (EGD) WITH PROPOFOL;  Surgeon: Daryel November, MD;  Location: [facility] ENDOSCOPY;  Service: Gastroenterology;  Laterality: N/A;   IR PARACENTESIS  05/09/2022   IR PARACENTESIS  05/31/2022   LAMINECTOMY  1995   L4-L5   TONSILLECTOMY     TOTAL HIP ARTHROPLASTY Left 2009     Social  History:   reports that he has been smoking cigarettes. He has a 3.00 pack-year smoking history. He has never used smokeless tobacco. He reports that he does not drink alcohol and does not use drugs.   Family History:  His family history includes Diabetes in his father and mother; Heart attack in his mother; Heart disease in his mother; Kidney disease in his father; Obesity in his brother.   Allergies Not on File   Home Medications  Prior to Admission medications   Medication Sig Start Date End Date Taking? Authorizing Provider  albuterol (VENTOLIN HFA) 108 (90 Base) MCG/ACT inhaler INHALE 1-2 PUFFS INTO THE LUNGS EVERY 4 (FOUR) HOURS AS NEEDED. SHORTNESS OF BREATH Patient taking differently: Inhale 2 puffs into the lungs every 4 (four) hours as needed for wheezing or shortness of breath. 12/31/21  Yes [name], MD  ALPRAZolam (XANAX) 0.5 MG tablet Take 1 tablet (0.5 mg total) by mouth 2 (two) times daily as needed for anxiety. 05/15/22  Yes Lucious Groves, DO  atorvastatin (LIPITOR) 40 MG tablet TAKE 1 TABLET BY MOUTH EVERY DAY Patient taking differently: Take 40 mg by mouth daily. 08/28/21  Yes [name], MD  furosemide (LASIX) 40 MG tablet Take 1 tablet (40 mg total) by mouth 2 (two) times daily. 05/23/22  Yes Lucious Groves, DO  HYDROcodone-acetaminophen (NORCO) 10-325 MG tablet Take 1 tablet by mouth every 6 (six) hours as needed. 05/15/22  Yes Lucious Groves, DO  nicotine (NICODERM CQ - DOSED IN MG/24 HOURS) 14 mg/24hr patch Place 1 patch (14 mg total) onto the skin daily.  05/11/22  Yes Serita Butcher, MD  predniSONE (DELTASONE) 5 MG tablet Take 1 tablet (5 mg total) by mouth daily with breakfast. 05/11/22  Yes Serita Butcher, MD  spironolactone (ALDACTONE) 100 MG tablet Take 2 tablets (200 mg total) by mouth daily. Patient taking differently: Take 100 mg by mouth daily. 05/23/22  Yes Lucious Groves, DO  Tiotropium Bromide-Olodaterol (STIOLTO RESPIMAT) 2.5-2.5 MCG/ACT AERS  INHALE 2 PUFFS BY MOUTH INTO THE LUNGS DAILY Patient taking differently: Inhale 2 each into the lungs daily. 04/01/22  Yes Lucious Groves, DO  ACCU-CHEK SOFTCLIX LANCETS lancets Check blood sugar once a day 12/25/17   Bartholomew Crews, MD  acetic acid 2 % otic solution Place 4 drops into both ears daily as needed. Patient not taking: Reported on 05/30/2022 03/05/22   Farrel Gordon, DO  Adalimumab (HUMIRA PEN) 40 MG/0.4ML PNKT Inject 40 mg into the skin every 14 (fourteen) days. Patient not taking: Reported on 05/30/2022    [provider]  Blood Glucose Monitoring Suppl (ACCU-CHEK AVIVA PLUS) w/Device KIT Check blood sugar once a day 04/19/16   Bartholomew Crews, MD  glucose blood (ACCU-CHEK AVIVA PLUS) test strip Check blood sugar once a day 12/25/17   Bartholomew Crews, MD  hydroxychloroquine (PLAQUENIL) 200 MG tablet Take 200 mg by mouth 2 (two) times daily. Patient not taking: Reported on 05/30/2022 10/17/20   [provider]  pantoprazole (PROTONIX) 40 MG tablet Take 1 tablet (40 mg total) by mouth daily. 05/11/22   Serita Butcher, MD     Critical care time: 45 minutes    JD Rexene Agent [facility] 06/01/2022, 6:30 AM  Please see [email or web address] for pager details.  From 7A-7P if no response, please call [phone]. After hours, please call ELink [phone].

## 2022-06-01 NOTE — Progress Notes (Signed)
Unable to assess pts SSRS due to pt intubated and sedated

## 2022-06-01 NOTE — Progress Notes (Signed)
PT Cancellation Note  Patient Details Name: Andrew Fowler MRN: [id number] DOB: [birth year]   Cancelled Treatment:    Reason Eval/Treat Not Completed: Patient not medically ready Pt transferred to ICU with increased respiratory distress and on now on BiPAP.  Wyona Almas, PT, DPT Acute Rehabilitation Services Office [phone]    Deno Etienne 06/01/2022, 7:46 AM

## 2022-06-01 NOTE — Progress Notes (Signed)
Report given to Andrew Fowler. Patient transferred shortly by 2 Rns. A&Ox4, on 8L HFNC, and able to follow commands. Transferred to [phone] and endorsed receiving RN. Care responsibilities transferred.

## 2022-06-01 NOTE — Progress Notes (Signed)
Patient transferred to ICU.  Report given

## 2022-06-01 NOTE — Progress Notes (Signed)
eLink Physician-Brief Progress Note Patient Name: Andrew Fowler DOB: 06/17/1960 MRN: [id number]   Date of Service  06/01/2022  HPI/Events of Note  Hyperglycemia - Blood glucose = 271. History of DM type II. Creatinine = 2.32.  eICU Interventions  Plan: Q 4 hour sensitive Novolog SSI.     Intervention Category Major Interventions: Hyperglycemia - active titration of insulin therapy  Lysle Dingwall 06/01/2022, 8:53 PM

## 2022-06-01 NOTE — Progress Notes (Signed)
An USGPIV (ultrasound guided PIV) has been placed for short-term vasopressor infusion. A correctly placed ivWatch must be used when administering Vasopressors. Should this treatment be needed beyond 72 hours, central line access should be obtained.  It will be the responsibility of the bedside nurse to follow best practice to prevent extravasations.   ?

## 2022-06-01 NOTE — Procedures (Signed)
Orogastric tube placed uneventfully  Tolerated well

## 2022-06-01 NOTE — Progress Notes (Addendum)
eLink Physician-Brief Progress Note Patient Name: Andrew Fowler DOB: 08-Aug-1960 MRN: [id number]   Date of Service  06/01/2022  HPI/Events of Note  Patient on BiPAP - Nursing request for Precedex. Patient appears calm via video assessment.   eICU Interventions  Plan: Will hold off on Precedex order pending assessment at bedside by PCCM ground team.      Intervention Category Major Interventions: Other:  Andrew Fowler 06/01/2022, 7:01 AM

## 2022-06-01 NOTE — Progress Notes (Signed)
Patient seen at bedside  Continues to struggle with the BiPAP Altered mental status  Intermittent agitation  ABG noted with worsening hypercarbia despite being on BiPAP  Decision made to intubate patient-intubated with a size 7.5 endotracheal tube

## 2022-06-01 NOTE — Significant Event (Signed)
Rapid Response Event Note   Reason for Call :  Respiratory distress, hypoxia  Per RN, pt sat up on the side of the bed, breathing became labored, and SpO2 dropped to 76% on 2L [place]. Pt was laid down in bed and place on a NRB with SpO2 increasing to 94%.  Initial Focused Assessment:  Pt lying in bed with eyes open, in respiratory distress. He is c/o CP/SOB. Breathing is tachypneic and labored. Lungs with rhonchi t/o. Skin cool to touch, dry.   HR-117, BP-106/48, RR-33, SpO2-94% on NRB.  Interventions:  NRB PCXR EKG-accelerated junction Plan of Care:  Pt breathing is better once back in bed. Tx to PCU for closer monitoring. Await PCXR results. May wean off NRB as SpO2 allows.  Please call RRT if further assistance needed.   Event Summary:   MD Notified: Drs. Faythe Ghee notified and came to bedside.  Call [place] Time:0220  Dillard Essex, RN

## 2022-06-01 NOTE — Progress Notes (Signed)
Patient is agitated. He continue to pull at things. Although he is able to answer orientation questions, he is becoming more confused. He is now requiring 15L non-rebreather and saturation is at 89-90% Markus Jarvis MD made aware.

## 2022-06-01 NOTE — Progress Notes (Signed)
Pt transported to 2M05 with 6E RN and 6E NT. Pt placed on bipap on arrival.

## 2022-06-01 NOTE — Progress Notes (Signed)
Unable to assess pts SSRS due to pt being intubated and sedated

## 2022-06-01 NOTE — Progress Notes (Signed)
Updated patients brother [name] of ongoing events   CXR reviewed, no pneumothorax, okay to use line

## 2022-06-01 NOTE — Progress Notes (Signed)
Received pt from [place] in no distress and on Bipap.vitals stable BP 121/6 HR 110 and O2 94

## 2022-06-01 NOTE — Progress Notes (Signed)
Initial Nutrition Assessment  DOCUMENTATION CODES:    (Unable to assess, significant edema (dry weight unknown), NFPE pending)  INTERVENTION:   Recommend Cortrak insertion when able  Tube Feeding via OG:  Vital 1.5 at 55 ml/hr Begin at 20 ml/hr; titrate by 10 mL q 8 hours until goal rate of 55 ml/hr Pro-Source TF20 60 mL TID Goal TF regimen provides   Suspect pt is at risk for refeeding, plan to start TF at low rate and titrate slowly to goal. Monitor magnesium, potassium, and phosphorus BID for at least 3 days, MD to replete as needed.  Add MVI with Minerals daily Add Folic Acid 1 mg daily Add IV thiamine 100 mg daily x 5 days  NUTRITION DIAGNOSIS:   Inadequate oral intake related to acute illness as evidenced by NPO status.  GOAL:   Patient will meet greater than or equal to 90% of their needs   MONITOR:   Vent status, TF tolerance, Labs, Weight trends, Skin  REASON FOR ASSESSMENT:   Consult, Ventilator Enteral/tube feeding initiation and management  ASSESSMENT:   62 yo male admitted with acute respiratory failure requiring intubation, decompensated cirrhosis secondary to NASH. PMH NASH cirrhosis, COPD, RA, HTN, tobacco use  3/08 Admitted, Paracentesis yielding 2.4 L fluid (pt requires frequent paracentesis) 3/09 Intubated, TF initiated  Pt currently sedated on vent support, currently on precedex gtt with fentanyl pushes not requiring pressor support-noted order for levophed but does not appear to be infusing yet  OG tube tip extends into stomach per chest xray but tip location not seen  Current wt 155.5 kg; noted weight has trended up over the years; noted significant gain between weight of 127.1 kg on 02/2022 and weight of 162 kg on 04/2022, suspect at least some of this is fluid weight gain. Noted pt with deep pitting generalized edema. EDW at this time unknown.  Paracentesis yesterday yielding 2.4 L of fluid. Per Radiology, pt has required >/=2 paracentesis  in 30 days  Unable to obtain diet and weight history from pt at this time  Ammonia 99 (not currently on lactulose), Lipase 58, T.bili 5.4 Lactic Acid 4.2 on admission, now wdl. WBC trending up.  Labs: sodium 125 (L), Creatinine 2.32, BUN 54, potassium 4.8 (wdl), ionized calcium wdl, phosphorus 5.6 (H), magnesium 2.3 (wdl) Meds: colace, miralax, lasix with albumin solumedrol, rocephin, azythromycin  NUTRITION - FOCUSED PHYSICAL EXAM:  Unable to assess, complete on follow-up  Diet Order:   Diet Order             Diet NPO time specified Except for: Sips with Meds  Diet effective now                   EDUCATION NEEDS:   Not appropriate for education at this time  Skin:  Skin Assessment: Reviewed RN Assessment  Last BM:  3/7  Height:   Ht Readings from Last 1 Encounters:  06/01/22 '5\' 11"'$  (1.803 m)    Weight:   Wt Readings from Last 1 Encounters:  05/31/22 (!) 155.5 kg    BMI:  Body mass index is 47.81 kg/m.  Estimated Nutritional Needs:   Kcal:  2000-2200 kcals  Protein:  140-160 g  Fluid:  1.8 L  Kerman Passey MS, RDN, LDN, CNSC Registered Dietitian 3 Clinical Nutrition RD Pager and On-Call Pager Number Located in [place]

## 2022-06-01 NOTE — Procedures (Signed)
Central Venous Catheter Insertion Procedure Note  NNAMDI DREWS  [id number]  April 28, 1960  Date:06/01/22  Time:4:21 PM   Provider Performing:Almendra Loria A Angie Hogg   Procedure: Insertion of Non-tunneled Central Venous Catheter(36556) with US guidance [id number])   Indication(s) Medication administration  Consent Unable to obtain consent due to emergent nature of procedure.  Anesthesia Topical only with 1% lidocaine   Timeout Verified patient identification, verified procedure, site/side was marked, verified correct patient position, special equipment/implants available, medications/allergies/relevant history reviewed, required imaging and test results available.  Sterile Technique Maximal sterile technique including full sterile barrier drape, hand hygiene, sterile gown, sterile gloves, mask, hair covering, sterile ultrasound probe cover (if used).  Procedure Description Area of catheter insertion was cleaned with chlorhexidine and draped in sterile fashion.  With real-time ultrasound guidance a central venous catheter was placed into the right internal jugular vein. Nonpulsatile blood flow and easy flushing noted in all ports.  The catheter was sutured in place and sterile dressing applied.  Complications/Tolerance None; patient tolerated the procedure well. Chest X-ray is ordered to verify placement for internal jugular or subclavian cannulation.   Chest x-ray is not ordered for femoral cannulation.  EBL Minimal  Specimen(s) None

## 2022-06-02 ENCOUNTER — Other Ambulatory Visit: Payer: Self-pay | Admitting: Student

## 2022-06-02 DIAGNOSIS — K7290 Hepatic failure, unspecified without coma: Secondary | ICD-10-CM | POA: Diagnosis not present

## 2022-06-02 DIAGNOSIS — K7460 Unspecified cirrhosis of liver: Secondary | ICD-10-CM | POA: Diagnosis not present

## 2022-06-02 LAB — BASIC METABOLIC PANEL
Anion gap: 15 (ref 5–15)
BUN: 63 mg/dL — ABNORMAL HIGH (ref 8–23)
CO2: 19 mmol/L — ABNORMAL LOW (ref 22–32)
Calcium: 9.1 mg/dL (ref 8.9–10.3)
Chloride: 92 mmol/L — ABNORMAL LOW (ref 98–111)
Creatinine, Ser: 2.15 mg/dL — ABNORMAL HIGH (ref 0.61–1.24)
GFR, Estimated: 34 mL/min — ABNORMAL LOW (ref >60)
Glucose, Bld: 258 mg/dL — ABNORMAL HIGH (ref 70–99)
Potassium: 4.7 mmol/L (ref 3.5–5.1)
Sodium: 126 mmol/L — ABNORMAL LOW (ref 135–145)

## 2022-06-02 LAB — CBC
HCT: 32.5 % — ABNORMAL LOW (ref 39.0–52.0)
Hemoglobin: 11.8 g/dL — ABNORMAL LOW (ref 13.0–17.0)
MCH: 32.8 pg (ref 26.0–34.0)
MCHC: 36.3 g/dL — ABNORMAL HIGH (ref 30.0–36.0)
MCV: 90.3 fL (ref 80.0–100.0)
Platelets: 64 10*3/uL — ABNORMAL LOW (ref 150–400)
RBC: 3.6 MIL/uL — ABNORMAL LOW (ref 4.22–5.81)
RDW: 18.1 % — ABNORMAL HIGH (ref 11.5–15.5)
WBC: 14.3 10*3/uL — ABNORMAL HIGH (ref 4.0–10.5)
nRBC: 0 % (ref 0.0–0.2)

## 2022-06-02 LAB — CULTURE, RESPIRATORY W GRAM STAIN

## 2022-06-02 LAB — POCT I-STAT 7, (LYTES, BLD GAS, ICA,H+H)
Acid-base deficit: 4 mmol/L — ABNORMAL HIGH (ref 0.0–2.0)
Bicarbonate: 21.4 mmol/L (ref 20.0–28.0)
Calcium, Ion: 1.23 mmol/L (ref 1.15–1.40)
HCT: 38 % — ABNORMAL LOW (ref 39.0–52.0)
Hemoglobin: 12.9 g/dL — ABNORMAL LOW (ref 13.0–17.0)
O2 Saturation: 100 %
Patient temperature: 98.2
Potassium: 4.8 mmol/L (ref 3.5–5.1)
Sodium: 126 mmol/L — ABNORMAL LOW (ref 135–145)
TCO2: 23 mmol/L (ref 22–32)
pCO2 arterial: 39.4 mmHg (ref 32–48)
pH, Arterial: 7.341 — ABNORMAL LOW (ref 7.35–7.45)
pO2, Arterial: 180 mmHg — ABNORMAL HIGH (ref 83–108)

## 2022-06-02 LAB — PHOSPHORUS: Phosphorus: 5.2 mg/dL — ABNORMAL HIGH (ref 2.5–4.6)

## 2022-06-02 LAB — GLUCOSE, CAPILLARY
Glucose-Capillary: 244 mg/dL — ABNORMAL HIGH (ref 70–99)
Glucose-Capillary: 279 mg/dL — ABNORMAL HIGH (ref 70–99)
Glucose-Capillary: 249 mg/dL — ABNORMAL HIGH (ref 70–99)
Glucose-Capillary: 233 mg/dL — ABNORMAL HIGH (ref 70–99)
Glucose-Capillary: 251 mg/dL — ABNORMAL HIGH (ref 70–99)
Glucose-Capillary: 242 mg/dL — ABNORMAL HIGH (ref 70–99)
Glucose-Capillary: 249 mg/dL — ABNORMAL HIGH (ref 70–99)

## 2022-06-02 LAB — AMMONIA: Ammonia: 39 umol/L — ABNORMAL HIGH (ref 9–35)

## 2022-06-02 LAB — MAGNESIUM: Magnesium: 2.6 mg/dL — ABNORMAL HIGH (ref 1.7–2.4)

## 2022-06-02 MED ORDER — FENTANYL 2500MCG IN NS 250ML (10MCG/ML) PREMIX INFUSION
100.0000 ug/h | INTRAVENOUS | Status: DC
  Administered 2022-06-02: 100 ug/h via INTRAVENOUS
  Administered 2022-06-03: 200 ug/h via INTRAVENOUS
  Administered 2022-06-03: 50 ug/h via INTRAVENOUS
  Administered 2022-06-04: 175 ug/h via INTRAVENOUS
  Administered 2022-06-06: 150 ug/h via INTRAVENOUS
  Administered 2022-06-07: 100 ug/h via INTRAVENOUS
  Administered 2022-06-08: 80 ug/h via INTRAVENOUS
  Filled 2022-06-02 (×7): qty 250

## 2022-06-02 MED ORDER — INSULIN ASPART 100 UNIT/ML IJ SOLN
0.0000 [IU] | INTRAMUSCULAR | Status: DC
  Administered 2022-06-02: 11 [IU] via SUBCUTANEOUS
  Administered 2022-06-02: 7 [IU] via SUBCUTANEOUS
  Administered 2022-06-02: 11 [IU] via SUBCUTANEOUS
  Administered 2022-06-02 (×2): 7 [IU] via SUBCUTANEOUS
  Administered 2022-06-03: 4 [IU] via SUBCUTANEOUS
  Administered 2022-06-03: 7 [IU] via SUBCUTANEOUS
  Administered 2022-06-03: 11 [IU] via SUBCUTANEOUS
  Administered 2022-06-03 – 2022-06-04 (×5): 7 [IU] via SUBCUTANEOUS
  Administered 2022-06-04 (×2): 11 [IU] via SUBCUTANEOUS
  Administered 2022-06-04: 7 [IU] via SUBCUTANEOUS
  Administered 2022-06-05: 15 [IU] via SUBCUTANEOUS
  Administered 2022-06-05: 11 [IU] via SUBCUTANEOUS
  Administered 2022-06-05: 15 [IU] via SUBCUTANEOUS
  Administered 2022-06-05 (×3): 11 [IU] via SUBCUTANEOUS
  Administered 2022-06-05: 15 [IU] via SUBCUTANEOUS
  Administered 2022-06-06 (×2): 4 [IU] via SUBCUTANEOUS
  Administered 2022-06-06: 11 [IU] via SUBCUTANEOUS
  Administered 2022-06-06 – 2022-06-07 (×8): 4 [IU] via SUBCUTANEOUS
  Administered 2022-06-08 (×2): 3 [IU] via SUBCUTANEOUS

## 2022-06-02 MED ORDER — POLYETHYLENE GLYCOL 3350 17 G PO PACK
17.0000 g | PACK | Freq: Two times a day (BID) | ORAL | Status: DC
  Administered 2022-06-02 – 2022-06-07 (×11): 17 g
  Filled 2022-06-02 (×11): qty 1

## 2022-06-02 MED ORDER — INSULIN ASPART 100 UNIT/ML IJ SOLN
4.0000 [IU] | INTRAMUSCULAR | Status: DC
  Administered 2022-06-02 – 2022-06-03 (×6): 4 [IU] via SUBCUTANEOUS

## 2022-06-02 MED ORDER — AZITHROMYCIN 500 MG PO TABS
500.0000 mg | ORAL_TABLET | Freq: Once | ORAL | Status: AC
  Administered 2022-06-02: 500 mg
  Filled 2022-06-02: qty 1

## 2022-06-02 MED ORDER — METOLAZONE 2.5 MG PO TABS
5.0000 mg | ORAL_TABLET | Freq: Every day | ORAL | Status: AC
  Administered 2022-06-02 – 2022-06-04 (×3): 5 mg
  Filled 2022-06-02 (×3): qty 2

## 2022-06-02 MED ORDER — MIDAZOLAM HCL 2 MG/2ML IJ SOLN: 2.0000 mg | Freq: Once | INTRAMUSCULAR | Status: AC

## 2022-06-02 MED ORDER — FUROSEMIDE 10 MG/ML IJ SOLN
40.0000 mg | Freq: Once | INTRAMUSCULAR | Status: AC
  Administered 2022-06-02: 40 mg via INTRAVENOUS
  Filled 2022-06-02: qty 4

## 2022-06-02 MED ORDER — THIAMINE MONONITRATE 100 MG PO TABS
100.0000 mg | ORAL_TABLET | Freq: Every day | ORAL | Status: DC
  Administered 2022-06-03 – 2022-06-07 (×5): 100 mg
  Filled 2022-06-02 (×5): qty 1

## 2022-06-02 MED ORDER — ACETAZOLAMIDE 250 MG PO TABS: 500.0000 mg | ORAL_TABLET | Freq: Every day | ORAL | Status: DC

## 2022-06-02 NOTE — Progress Notes (Signed)
NAME:  Andrew Fowler, MRN:  [id number], DOB:  1960/11/25, LOS: 2 ADMISSION DATE:  05/30/2022, CONSULTATION DATE:  06/02/2022 REFERRING MD:  Dr Markus Jarvis, CHIEF COMPLAINT:   Acute hypoxic respiratory failure  History of Present Illness:  Patient is a 62 year old male with pertinent PMH NASH cirrhosis, COPD, OSA, RA, T2DM, severe obesity presents to [facility] ED on 3/7 with SOB.   Patient states he has been having progressively worsening SOB over the past 10 days.  States he has noticed more wheezing and productive cough.  Denies fever/chills.  Also noticed more swelling in legs and body and admits to gaining weight.  States he has been taking his home medications faithfully.  On 3/7 came to [facility] ED for further eval.  Vital stable.  Sats 95% on room air.  Labs showing significant hyponatremia, AKI, elevated liver enzymes , low albumin, LA 4.2 then 2.9.  Chronic thrombocytopenia 104.  Patient was given IV Lasix and some breathing treatments which helped with breathing.  Started on steroids.  CXR showing perihilar opacities.  Started on rocephin/azithromycin. On 3/8 paracentesis was performed pulling 2.4L of clear yellow fluid.  Albumin given.  Spironolactone/Lasix held due to elevated creatinine.   On 3/9 patient with worsening respiratory distress at 2 AM.  Patient sats dropped to 76% on 2 LNC.  Patient was placed on NRB with improvement in sats.  CXR showing worsening infiltrate versus edema bilaterally.  Patient transferred to progressive and given Lasix.  Patient only put out 300 mL UOP.  ABG 7.31, 42, 111, 21.  Patient with sats only 88% on NRB and still in respiratory distress.  PCCM consulted.  Pertinent  Medical History   Past Medical History:  Diagnosis Date   Abnormal laboratory test result 07/04/2011   Immunofixation electrophoresis 3/13 showed slightly restricted mobility in the IgG and kappa lanes and suggested repeat end of year.    Anxiety associated with depression 04/19/2011   On chronic benzos  and SSRI's. Gets panic attacks. Does not see mental health.   Chronic pain syndrome 04/19/2011   Combination of RA, obesity, OA, & DDD. Has seen Dr Sharol Given & s/p R total hip arthroplasty 2/2 OA & ? AVN. s/p L4-L5 laminectomy. Lumbar MRI 4/11 : L3-L4 mod central canal narrowing.  Cervical MRI 6/11 : multi-level DDD and L foraminal stenosis at C4-5 and C5-6.    Chronic venous insufficiency 2013   ECHO 2013 was normal. LFT's, creatinine, TSH all normal. Alb a bit low.   COPD (chronic obstructive pulmonary disease) ([place])    Per pt, he has a diagnosis of COPD. No PFT's. Uses Alb MDI less than once a month.   Diabetes mellitus    Non-insulin dependent Type II.   Edema 04/19/2011   ECHO 2013 was normal. LFT's, creatinine, TSH all normal. Alb a bit low. Likely 2/2 chronic venous insuff 2/2 weight.   Elevated liver function tests 05/17/2011   04/2010. Increased GGT (pt uses ETOH). AMA negative. ABD U/S limited but otherwise negative. Follow Alk phos level. AST & ALT also elevated. False + IgM Hep B Core Ab. Hep B viral load negative.   Same pattern during hospitalization 2010 : highest alkaline phosphatase was 355, AST 259, and ALT 871. ANA, rheumatoid factor, ceruloplasmin, CMV IgM, alpha antitrypsin, and AMA, all within normal limits.   Encounter for power mobility device assessment 03/14/2020   Erythrocytosis 07/04/2016   Gout    Pt has never had a crystal diagnosis.   Hepatitis B antibody positive  2013   IgM Hep B core Ab + but Hepatitis B viral load negative.    Hyperlipidemia    At goal of LDL <100 with statin.   Hypertension    ACEI monotherapy   Leukocytosis 01/11/2014   Since 2010. Smear 2015 mild left shift. Is on prednisone for RA but increase started before leukocytosis.    NASH (nonalcoholic steatohepatitis), ? some EtOH contribution and fibrosis  05/17/2011   Elevated since 05/2008 during hospitalization. Relatively stable. Extensive W/U and no etiology but likely fatty liver 2/2 obesity despite  normal imaging.  Hepatitis - all negative. There was an initial + IgM Hep B core Ab but repeat testing was negative and Hep B viral load was negative.  TTG 06/15/2013  ANA x 2 (05/07/11 and 06/13/2008) ASMA x2 (07/18/2011 negative & weakly + at 21 on 05/27/2011 & nega   OA (osteoarthritis)    Obesity, Class III, BMI 40-49.9 (morbid obesity) ([place])    Obstructive sleep apnea 08/21/08   Sleep study AHI 16.4 with desat to 66%. CPAP of 17 decreased AHI to 0.9. Non-compliant with CPAP.   RA (rheumatoid arthritis) ([place]) 2013   Shortness of breath    Tobacco abuse    Per pt, he has a diagnosis of COPD. Need to locate PFT's.   Wheelchair bound    Significant Hospital Events: Including procedures, antibiotic start and stop dates in addition to other pertinent events   3/7 admitted to [facility] for shortness of breath, admitted to floors 3/9 worsening respiratory distress with hypoxia, transferred to ICU, started on BiPAP.  Respiratory worsening,-intubated.  Requiring pressors  Interim History / Subjective:  No overnight events Awake, restless this morning  Objective   Blood pressure 120/65, pulse 91, temperature 98.9 F (37.2 C), temperature source Axillary, resp. rate (!) 28, height '5\' 11"'$  (1.803 m), weight (!) 148.9 kg, SpO2 94 %. CVP:  [20 mmHg-22 mmHg] 21 mmHg  Vent Mode: PRVC FiO2 (%):  [60 %-100 %] 60 % Set Rate:  [24 bmp-28 bmp] 24 bmp Vt Set:  [450 mL-460 mL] 460 mL PEEP:  [10 cmH20-12 cmH20] 12 cmH20 Plateau Pressure:  [22 cmH20-29 cmH20] 29 cmH20   Intake/Output Summary (Last 24 hours) at 06/02/2022 0802 Last data filed at 06/02/2022 0600 Gross per 24 hour  Intake 2748.31 ml  Output 1025 ml  Net 1723.31 ml   Filed Weights   05/30/22 2234 05/31/22 0334 06/02/22 0500  Weight: (!) 155.5 kg (!) 155.5 kg (!) 148.9 kg    Examination: General: Middle-aged, chronically ill-appearing HENT: Endotracheal tube in place, moist oral mucosa Lungs: Wheezes bilaterally Cardiovascular: S1-S2  appreciated Abdomen: Soft, bowel sounds appreciated Extremities: No clubbing, mild edema Neuro: Awake, arousable, agitated.  Moving all extremities GU:    Resolved Hospital Problem list    Assessment & Plan:  Acute hypoxemic respiratory failure Acute hypercapnic respiratory failure Chronic obstructive pulmonary disease with exacerbation  possible community-acquired pneumonia -Continue full ventilatory support -Continue bronchodilator treatments -Continue steroids -Low tidal volume ventilation strategy -Target tidal volume 6-8 -Target plateau pressure less than 30 -Target PaO2 of 55-65 -VAP protocol in place  -Agitation management with Precedex, fentanyl -PRN Versed -Day 3 of azithromycin, day 3 of ceftriaxone -Will stop the azithromycin after today's dose, 5 days total of ceftriaxone  Decompensated cirrhosis secondary to NASH Hypoalbuminemia Chronic hyponatremia -Lasix today -Spironolactone held -metolazone for 3 days  Hyperammonemia -Repeat ammonia level pending at present  Acute kidney injury -Continue to trend -Maintain renal perfusion by maintain MAP greater  than 65  Rheumatoid arthritis -Continue IV steroids -Plaquenil on hold  Chronic, cytopenia Anemia -Trend CBC  Tobacco use disorder -Continue nicotine patch   Best Practice (right click and "Reselect all SmartList Selections" daily)   Diet/type: tubefeeds DVT prophylaxis: prophylactic heparin  GI prophylaxis: PPI Lines: Central line Foley:  Yes, and it is still needed Code Status:  full code Last date of multidisciplinary goals of care discussion [discussed with patient's brother 06/01/2022]  Labs   CBC: Recent Labs  Lab 05/30/22 2150 05/31/22 0444 06/01/22 0610 06/01/22 0731 06/01/22 1104 06/02/22 0322 06/02/22 0432  WBC 12.5* 9.7 18.4*  --   --   --  14.3*  NEUTROABS 10.4*  --  15.3*  --   --   --   --   HGB 12.4* 13.1 12.6* 12.9* 12.6* 12.9* 11.8*  HCT 35.3* 34.9* 34.2* 38.0* 37.0*  38.0* 32.5*  MCV 93.1 88.1 89.8  --   --   --  90.3  PLT 104* 82* 100*  --   --   --  64*    Basic Metabolic Panel: Recent Labs  Lab 05/30/22 2150 05/31/22 0444 06/01/22 0610 06/01/22 0731 06/01/22 0937 06/01/22 1104 06/01/22 1732 06/02/22 0322 06/02/22 0432  NA 124* 125* 125* 125*  --  125*  --  126* 126*  K 4.9 4.5 4.8 4.8  --  4.7  --  4.8 4.7  CL 94* 93* 92*  --   --   --   --   --  92*  CO2 20* 23 19*  --   --   --   --   --  19*  GLUCOSE 153* 160* 162*  --   --   --   --   --  258*  BUN 41* 44* 54*  --   --   --   --   --  63*  CREATININE 2.24* 2.16* 2.32*  --   --   --   --   --  2.15*  CALCIUM 8.0* 8.3* 8.9  --   --   --   --   --  9.1  MG  --   --  2.4  --  2.3  --  2.4  --  2.6*  PHOS  --   --   --   --  5.6*  --  6.2*  --  5.2*   GFR: Estimated Creatinine Clearance: 53.4 mL/min (A) (by C-G formula based on SCr of 2.15 mg/dL (H)). Recent Labs  Lab 05/30/22 2150 05/30/22 2209 05/30/22 2328 05/31/22 0444 05/31/22 0943 06/01/22 0610 06/02/22 0432  WBC 12.5*  --   --  9.7  --  18.4* 14.3*  LATICACIDVEN  --  4.2* 2.9* 2.0* 1.8  --   --     Liver Function Tests: Recent Labs  Lab 05/30/22 2150 05/31/22 0444 06/01/22 0610  AST 99* 85* 75*  ALT 72* 73* 61*  ALKPHOS 449* 456* 406*  BILITOT 5.6* 5.4* 5.4*  PROT 5.5* 6.1* 6.7  ALBUMIN 1.7* 1.8* 3.2*   Recent Labs  Lab 05/30/22 2150  LIPASE 58*   Recent Labs  Lab 05/30/22 2328  AMMONIA 99*    ABG    Component Value Date/Time   PHART 7.341 (L) 06/02/2022 0322   PCO2ART 39.4 06/02/2022 0322   PO2ART 180 (H) 06/02/2022 0322   HCO3 21.4 06/02/2022 0322   TCO2 23 06/02/2022 0322   ACIDBASEDEF 4.0 (H) 06/02/2022 0322   O2SAT 100 06/02/2022  [id number]     Coagulation Profile: Recent Labs  Lab 05/30/22 2328 05/31/22 0444  INR 1.2 1.3*    Cardiac Enzymes: No results for input(s): "CKTOTAL", "CKMB", "CKMBINDEX", "TROPONINI" in the last 168 hours.  HbA1C: Hemoglobin A1C  Date/Time Value Ref  Range Status  03/05/2022 10:13 AM 6.1 (A) 4.0 - 5.6 % Final  07/09/2021 12:13 PM 5.4 4.0 - 5.6 % Final  09/07/2018 12:00 AM 6.5  Final   Hgb A1c MFr Bld  Date/Time Value Ref Range Status  05/16/2012 04:48 AM 8.7 (H) <5.7 % Final    Comment:    (NOTE)                                                                       According to the ADA Clinical Practice Recommendations for 2011, when HbA1c is used as a screening test:  >=6.5%   Diagnostic of Diabetes Mellitus           (if abnormal result is confirmed) 5.7-6.4%   Increased risk of developing Diabetes Mellitus References:Diagnosis and Classification of Diabetes Mellitus,Diabetes Care,2011,34(Suppl 1):S62-S69 and Standards of Medical Care in         Diabetes - 2011,Diabetes [id number] (Suppl 1):S11-S61.  05/06/2011 09:15 PM 6.1 (H) <5.7 % Final    Comment:    (NOTE)                                                                       According to the ADA Clinical Practice Recommendations for 2011, when HbA1c is used as a screening test:  >=6.5%   Diagnostic of Diabetes Mellitus           (if abnormal result is confirmed) 5.7-6.4%   Increased risk of developing Diabetes Mellitus References:Diagnosis and Classification of Diabetes Mellitus,Diabetes [id number] 1):S62-S69 and Standards of Medical Care in         Diabetes - 2011,Diabetes Care,2011,34 (Suppl 1):S11-S61.    CBG: Recent Labs  Lab 06/01/22 1913 06/01/22 2119 06/01/22 2316 06/02/22 0319 06/02/22 0733  GLUCAP 271* 262* 263* 244* 279*    Review of Systems:   Unable to provide history  Past Medical History:  He,  has a past medical history of Abnormal laboratory test result (07/04/2011), Anxiety associated with depression (04/19/2011), Chronic pain syndrome (04/19/2011), Chronic venous insufficiency (2013), COPD (chronic obstructive pulmonary disease) ([place]), Diabetes mellitus, Edema (04/19/2011), Elevated liver function tests (05/17/2011), Encounter for  power mobility device assessment (03/14/2020), Erythrocytosis (07/04/2016), Gout, Hepatitis B antibody positive (2013), Hyperlipidemia, Hypertension, Leukocytosis (01/11/2014), NASH (nonalcoholic steatohepatitis), ? some EtOH contribution and fibrosis  (05/17/2011), OA (osteoarthritis), Obesity, Class III, BMI 40-49.9 (morbid obesity) ([place]), Obstructive sleep apnea (08/21/08), RA (rheumatoid arthritis) ([place]) (2013), Shortness of breath, Tobacco abuse, and Wheelchair bound.   Surgical History:   Past Surgical History:  Procedure Laterality Date   ESOPHAGOGASTRODUODENOSCOPY (EGD) WITH PROPOFOL N/A 05/08/2022   Procedure: ESOPHAGOGASTRODUODENOSCOPY (EGD) WITH PROPOFOL;  Surgeon: Daryel November, MD;  Location: [facility] ENDOSCOPY;  Service: Gastroenterology;  Laterality:  N/A;   IR PARACENTESIS  05/09/2022   IR PARACENTESIS  05/31/2022   LAMINECTOMY  1995   L4-L5   TONSILLECTOMY     TOTAL HIP ARTHROPLASTY Left 2009     Social History:   reports that he has been smoking cigarettes. He has a 3.00 pack-year smoking history. He has never used smokeless tobacco. He reports that he does not drink alcohol and does not use drugs.   Family History:  His family history includes Diabetes in his father and mother; Heart attack in his mother; Heart disease in his mother; Kidney disease in his father; Obesity in his brother.   Allergies Not on File   The patient is critically ill with multiple organ systems failure and requires high complexity decision making for assessment and support, frequent evaluation and titration of therapies, application of advanced monitoring technologies and extensive interpretation of multiple databases. Critical Care Time devoted to patient care services described in this note independent of APP/resident time (if applicable)  is 35 minutes.   Sherrilyn Rist MD [facility] Personal pager: See Amion If unanswered, please page CCM On-call: [phone] She has some  dementia at baseline

## 2022-06-02 NOTE — Progress Notes (Signed)
Unable to assess pts SSRS due to pt being intubated and sedated

## 2022-06-02 NOTE — Progress Notes (Signed)
Pt noted to have missing beats throughout the day and MD made aware.

## 2022-06-02 NOTE — Progress Notes (Signed)
PT Cancellation Note  Patient Details Name: VIAN PETTITT MRN: [id number] DOB: 1961-01-10   Cancelled Treatment:    Reason Eval/Treat Not Completed: Medical issues which prohibited therapy (pt now intubated and sedated; will sign off. Please re-consult if status changes).  Wyona Almas, PT, DPT Acute Rehabilitation Services Office [phone]    Deno Etienne 06/02/2022, 7:34 AM

## 2022-06-03 DIAGNOSIS — J9602 Acute respiratory failure with hypercapnia: Secondary | ICD-10-CM

## 2022-06-03 DIAGNOSIS — J9601 Acute respiratory failure with hypoxia: Secondary | ICD-10-CM | POA: Diagnosis not present

## 2022-06-03 LAB — CBC
HCT: 32.8 % — ABNORMAL LOW (ref 39.0–52.0)
Hemoglobin: 11.4 g/dL — ABNORMAL LOW (ref 13.0–17.0)
MCH: 32 pg (ref 26.0–34.0)
MCHC: 34.8 g/dL (ref 30.0–36.0)
MCV: 92.1 fL (ref 80.0–100.0)
Platelets: 70 10*3/uL — ABNORMAL LOW (ref 150–400)
RBC: 3.56 MIL/uL — ABNORMAL LOW (ref 4.22–5.81)
RDW: 18.1 % — ABNORMAL HIGH (ref 11.5–15.5)
WBC: 12.5 10*3/uL — ABNORMAL HIGH (ref 4.0–10.5)
nRBC: 0 % (ref 0.0–0.2)

## 2022-06-03 LAB — BASIC METABOLIC PANEL
Anion gap: 14 (ref 5–15)
BUN: 77 mg/dL — ABNORMAL HIGH (ref 8–23)
CO2: 20 mmol/L — ABNORMAL LOW (ref 22–32)
Calcium: 9 mg/dL (ref 8.9–10.3)
Chloride: 95 mmol/L — ABNORMAL LOW (ref 98–111)
Creatinine, Ser: 1.97 mg/dL — ABNORMAL HIGH (ref 0.61–1.24)
GFR, Estimated: 38 mL/min — ABNORMAL LOW (ref >60)
Glucose, Bld: 251 mg/dL — ABNORMAL HIGH (ref 70–99)
Potassium: 4.8 mmol/L (ref 3.5–5.1)
Sodium: 129 mmol/L — ABNORMAL LOW (ref 135–145)

## 2022-06-03 LAB — GLUCOSE, CAPILLARY
Glucose-Capillary: 230 mg/dL — ABNORMAL HIGH (ref 70–99)
Glucose-Capillary: 237 mg/dL — ABNORMAL HIGH (ref 70–99)
Glucose-Capillary: 261 mg/dL — ABNORMAL HIGH (ref 70–99)
Glucose-Capillary: 257 mg/dL — ABNORMAL HIGH (ref 70–99)
Glucose-Capillary: 227 mg/dL — ABNORMAL HIGH (ref 70–99)
Glucose-Capillary: 257 mg/dL — ABNORMAL HIGH (ref 70–99)

## 2022-06-03 LAB — CYTOLOGY - NON PAP

## 2022-06-03 LAB — PHOSPHORUS: Phosphorus: 4.1 mg/dL (ref 2.5–4.6)

## 2022-06-03 LAB — MAGNESIUM: Magnesium: 2.7 mg/dL — ABNORMAL HIGH (ref 1.7–2.4)

## 2022-06-03 MED ORDER — FUROSEMIDE 10 MG/ML IJ SOLN
40.0000 mg | Freq: Once | INTRAMUSCULAR | Status: AC
  Administered 2022-06-03: 40 mg via INTRAVENOUS
  Filled 2022-06-03: qty 4

## 2022-06-03 MED ORDER — INSULIN ASPART 100 UNIT/ML IJ SOLN
6.0000 [IU] | INTRAMUSCULAR | Status: DC
  Administered 2022-06-03 – 2022-06-04 (×4): 6 [IU] via SUBCUTANEOUS

## 2022-06-03 MED ORDER — FENTANYL BOLUS VIA INFUSION
50.0000 ug | INTRAVENOUS | Status: DC | PRN
  Administered 2022-06-03 – 2022-06-04 (×7): 100 ug via INTRAVENOUS
  Administered 2022-06-05 (×2): 50 ug via INTRAVENOUS
  Administered 2022-06-06: 100 ug via INTRAVENOUS
  Administered 2022-06-06 (×4): 50 ug via INTRAVENOUS
  Administered 2022-06-06: 100 ug via INTRAVENOUS
  Administered 2022-06-07: 50 ug via INTRAVENOUS
  Administered 2022-06-07 – 2022-06-08 (×4): 100 ug via INTRAVENOUS

## 2022-06-03 MED ORDER — INSULIN GLARGINE-YFGN 100 UNIT/ML ~~LOC~~ SOLN
5.0000 [IU] | Freq: Every day | SUBCUTANEOUS | Status: DC
  Filled 2022-06-03: qty 0.05

## 2022-06-03 MED ORDER — BISACODYL 10 MG RE SUPP
10.0000 mg | Freq: Once | RECTAL | Status: AC
  Administered 2022-06-03: 10 mg via RECTAL
  Filled 2022-06-03: qty 1

## 2022-06-03 MED ORDER — QUETIAPINE FUMARATE 50 MG PO TABS: 50.0000 mg | ORAL_TABLET | Freq: Every day | ORAL | Status: DC

## 2022-06-03 MED ORDER — INSULIN GLARGINE-YFGN 100 UNIT/ML ~~LOC~~ SOLN
10.0000 [IU] | Freq: Every day | SUBCUTANEOUS | Status: DC
  Administered 2022-06-03: 10 [IU] via SUBCUTANEOUS
  Filled 2022-06-03 (×2): qty 0.1

## 2022-06-03 MED ORDER — QUETIAPINE FUMARATE 50 MG PO TABS
50.0000 mg | ORAL_TABLET | Freq: Every day | ORAL | Status: DC
  Administered 2022-06-03: 50 mg
  Filled 2022-06-03: qty 1

## 2022-06-03 NOTE — Progress Notes (Signed)
Inpatient Diabetes Program Recommendations  AACE/ADA: New Consensus Statement on Inpatient Glycemic Control (2015)  Target Ranges:  Prepandial:   less than 140 mg/dL      Peak postprandial:   less than 180 mg/dL (1-2 hours)      Critically ill patients:  140 - 180 mg/dL   Lab Results  Component Value Date   GLUCAP 237 (H) 06/03/2022   HGBA1C 6.1 (A) 03/05/2022    Review of Glycemic Control  Latest Reference Range & Units 06/02/22 15:46 06/02/22 19:17 06/02/22 23:08 06/03/22 03:12 06/03/22 07:23  Glucose-Capillary 70 - 99 mg/dL 251 (H) 242 (H) 249 (H) 230 (H) 237 (H)   Diabetes history: DM  Outpatient Diabetes medications: None Current orders for Inpatient glycemic control:  Novolog resistant q 4 hours Novolog 4 units q 4 hours (tube feed coverage) Vital 55 ml/hr Semglee 10 units daily (started today) Solumedrol 80 mg IV daily  Inpatient Diabetes Program Recommendations:    Agree with current orders.  Semglee added today.  Will follow.   Thanks,  Adah Perl, RN, BC-ADM Inpatient Diabetes Coordinator Pager [phone]  (8a-5p)

## 2022-06-03 NOTE — Progress Notes (Signed)
eLink Physician-Brief Progress Note Patient Name: Andrew Fowler DOB: 03-Oct-1960 MRN: [id number]   Date of Service  06/03/2022  HPI/Events of Note  Agitation - Nursing request to renew bilateral wrist restraints.   eICU Interventions  Will renew order for bilateral soft wrist restraints X 4 hours.      Intervention Category Major Interventions: Delirium, psychosis, severe agitation - evaluation and management  Levester Waldridge Eugene 06/03/2022, 6:26 AM

## 2022-06-03 NOTE — Progress Notes (Signed)
RT NOTE: patient does not meet SBT criteria for this AM due to PEEP and FIO2 requirements.  Tolerating current ventilator settings well at this time.  Will continue to monitor.

## 2022-06-03 NOTE — Progress Notes (Cosign Needed)
Patient admitted for Acute hypoxemic respiratory failure. Currently intubated.  TOC following.

## 2022-06-03 NOTE — Progress Notes (Addendum)
NAME:  Andrew Fowler, MRN:  [id number], DOB:  01-22-1961, LOS: 3 ADMISSION DATE:  05/30/2022, CONSULTATION DATE:  06/02/2022 REFERRING MD:  Dr Markus Jarvis, CHIEF COMPLAINT:   Acute hypoxic respiratory failure  History of Present Illness:  Patient is a 62 year old male with pertinent PMH NASH cirrhosis, COPD, OSA, RA, T2DM, severe obesity presents to [facility] ED on 3/7 with SOB.   Patient states he has been having progressively worsening SOB over the past 10 days.  States he has noticed more wheezing and productive cough.  Denies fever/chills.  Also noticed more swelling in legs and body and admits to gaining weight.  States he has been taking his home medications faithfully.  On 3/7 came to [facility] ED for further eval.  Vital stable.  Sats 95% on room air.  Labs showing significant hyponatremia, AKI, elevated liver enzymes , low albumin, LA 4.2 then 2.9.  Chronic thrombocytopenia 104.  Patient was given IV Lasix and some breathing treatments which helped with breathing.  Started on steroids.  CXR showing perihilar opacities.  Started on rocephin/azithromycin. On 3/8 paracentesis was performed pulling 2.4L of clear yellow fluid.  Albumin given.  Spironolactone/Lasix held due to elevated creatinine.   On 3/9 patient with worsening respiratory distress at 2 AM.  Patient sats dropped to 76% on 2 LNC.  Patient was placed on NRB with improvement in sats.  CXR showing worsening infiltrate versus edema bilaterally.  Patient transferred to progressive and given Lasix.  Patient only put out 300 mL UOP.  ABG 7.31, 42, 111, 21.  Patient with sats only 88% on NRB and still in respiratory distress.  PCCM consulted.  Pertinent  Medical History   Past Medical History:  Diagnosis Date   Abnormal laboratory test result 07/04/2011   Immunofixation electrophoresis 3/13 showed slightly restricted mobility in the IgG and kappa lanes and suggested repeat end of year.    Anxiety associated with depression 04/19/2011   On chronic benzos  and SSRI's. Gets panic attacks. Does not see mental health.   Chronic pain syndrome 04/19/2011   Combination of RA, obesity, OA, & DDD. Has seen Dr Sharol Given & s/p R total hip arthroplasty 2/2 OA & ? AVN. s/p L4-L5 laminectomy. Lumbar MRI 4/11 : L3-L4 mod central canal narrowing.  Cervical MRI 6/11 : multi-level DDD and L foraminal stenosis at C4-5 and C5-6.    Chronic venous insufficiency 2013   ECHO 2013 was normal. LFT's, creatinine, TSH all normal. Alb a bit low.   COPD (chronic obstructive pulmonary disease) ([place])    Per pt, he has a diagnosis of COPD. No PFT's. Uses Alb MDI less than once a month.   Diabetes mellitus    Non-insulin dependent Type II.   Edema 04/19/2011   ECHO 2013 was normal. LFT's, creatinine, TSH all normal. Alb a bit low. Likely 2/2 chronic venous insuff 2/2 weight.   Elevated liver function tests 05/17/2011   04/2010. Increased GGT (pt uses ETOH). AMA negative. ABD U/S limited but otherwise negative. Follow Alk phos level. AST & ALT also elevated. False + IgM Hep B Core Ab. Hep B viral load negative.   Same pattern during hospitalization 2010 : highest alkaline phosphatase was 355, AST 259, and ALT 871. ANA, rheumatoid factor, ceruloplasmin, CMV IgM, alpha antitrypsin, and AMA, all within normal limits.   Encounter for power mobility device assessment 03/14/2020   Erythrocytosis 07/04/2016   Gout    Pt has never had a [place] diagnosis.   Hepatitis B antibody positive  2013   IgM Hep B core Ab + but Hepatitis B viral load negative.    Hyperlipidemia    At goal of LDL <100 with statin.   Hypertension    ACEI monotherapy   Leukocytosis 01/11/2014   Since 2010. Smear 2015 mild left shift. Is on prednisone for RA but increase started before leukocytosis.    NASH (nonalcoholic steatohepatitis), ? some EtOH contribution and fibrosis  05/17/2011   Elevated since 05/2008 during hospitalization. Relatively stable. Extensive W/U and no etiology but likely fatty liver 2/2 obesity despite  normal imaging.  Hepatitis - all negative. There was an initial + IgM Hep B core Ab but repeat testing was negative and Hep B viral load was negative.  TTG 06/15/2013  ANA x 2 (05/07/11 and 06/13/2008) ASMA x2 (07/18/2011 negative & weakly + at 21 on 05/27/2011 & nega   OA (osteoarthritis)    Obesity, Class III, BMI 40-49.9 (morbid obesity) ([place])    Obstructive sleep apnea 08/21/08   Sleep study AHI 16.4 with desat to 66%. CPAP of 17 decreased AHI to 0.9. Non-compliant with CPAP.   RA (rheumatoid arthritis) ([place]) 2013   Shortness of breath    Tobacco abuse    Per pt, he has a diagnosis of COPD. Need to locate PFT's.   Wheelchair bound    Significant Hospital Events: Including procedures, antibiotic start and stop dates in addition to other pertinent events   3/7 admitted to [facility] for shortness of breath, admitted to floors 3/9 worsening respiratory distress with hypoxia, transferred to ICU, started on BiPAP.  Respiratory worsening,-intubated.  Requiring pressors  Interim History / Subjective:  Continues to be agitated, requiring fentanyl and versed pushes overnight.   Objective   Blood pressure (!) 120/57, pulse 84, temperature 98.9 F (37.2 C), temperature source Axillary, resp. rate 20, height '5\' 11"'$  (1.803 m), weight (!) 148.9 kg, SpO2 96 %. CVP:  [9 mmHg-48 mmHg] 16 mmHg  Vent Mode: PRVC FiO2 (%):  [40 %-60 %] 40 % Set Rate:  [20 bmp-24 bmp] 20 bmp Vt Set:  [460 mL-600 mL] 600 mL PEEP:  [10 cmH20-12 cmH20] 10 cmH20 Plateau Pressure:  [27 cmH20-31 cmH20] 29 cmH20   Intake/Output Summary (Last 24 hours) at 06/03/2022 0733 Last data filed at 06/03/2022 0533 Gross per 24 hour  Intake 2478.5 ml  Output 1230 ml  Net 1248.5 ml   Filed Weights   05/31/22 0334 06/02/22 0500 06/03/22 0314  Weight: (!) 155.5 kg (!) 148.9 kg (!) 148.9 kg    Examination: General: Chronically ill-appearing, laying in bed in no acute distress HENT: Endotracheal tube in place, moist oral mucosa, Lungs:  Endotracheal tube in place, normal work of breathing. Rhonchi bilaterally, mild wheezing. Cardiovascular: Regular rate, rhythm. No murmurs. Warm extremities.  Abdomen: Soft, normoactive bowel sounds.  Extremities: 2-3+ pitting edema bilaterally to hips Neuro: Somnolent, not arousable on full sedation   Resolved Hospital Problem list    Assessment & Plan:  #Acute hypoxemic respiratory failure #Acute hypercapnic respiratory failure #COPD exacerbation, possible community-acquired pneumonia -Continue full ventilatory support, FiO2 down to 40% this morning -Continue DuoNebs every 4 hours -Continue Solumedrol '80mg'$  (day 3) -Continue ceftriaxone daily (day 4) -Low tidal volume ventilation strategy -Target tidal volume 6-8 -Target plateau pressure less than 30 -Target PaO2 of 55-65 -VAP protocol in place  -Minimal pressors required, wean as able  #Decompensated cirrhosis secondary to NASH #Hypoalbuminemia #Chronic hyponatremia Good urine output yesterday, still quite edematous. Pressure holding well on minimal pressures.  -  Another dose of lasix today -metolazone for 3 days -Hold spironolactone   #Acute kidney injury Renal function mildly improving after diuresis yesterday, plan to give more today.  -Continue to trend -Maintain renal perfusion by maintain MAP greater than 65  #Agitation On max dose fentanyl, started on Precedex as well. Required multiple pushes of PRN versed, fentanyl overnight. - Start seroquel - Continue fentanyl, Precedex for agitation  - Fentanyl, versed PRN  #Type II diabetes mellitus On tube feeds and IV steroids, sugars have remained elevated in 200's, will adjust insulin regimen today.  - Start semglee 10 units - Continue Novolog 4u + SSI q4h  #Rheumatoid arthritis -Continue IV steroids -Plaquenil on hold  #Chronic, cytopenia #Anemia -Trend CBC  #Tobacco use disorder -Continue nicotine patch  Best Practice (right click and "Reselect all  SmartList Selections" daily)   Diet/type: tubefeeds DVT prophylaxis: prophylactic heparin  GI prophylaxis: PPI Lines: Central line Foley:  Yes, and it is still needed Code Status:  full code Last date of multidisciplinary goals of care discussion [discussed with patient's brother 06/01/2022]  Labs   CBC: Recent Labs  Lab 05/30/22 2150 05/31/22 0444 06/01/22 0610 06/01/22 0731 06/01/22 1104 06/02/22 0322 06/02/22 0432 06/03/22 0541  WBC 12.5* 9.7 18.4*  --   --   --  14.3* 12.5*  NEUTROABS 10.4*  --  15.3*  --   --   --   --   --   HGB 12.4* 13.1 12.6* 12.9* 12.6* 12.9* 11.8* 11.4*  HCT 35.3* 34.9* 34.2* 38.0* 37.0* 38.0* 32.5* 32.8*  MCV 93.1 88.1 89.8  --   --   --  90.3 92.1  PLT 104* 82* 100*  --   --   --  64* 70*    Basic Metabolic Panel: Recent Labs  Lab 05/30/22 2150 05/31/22 0444 06/01/22 0610 06/01/22 0731 06/01/22 0937 06/01/22 1104 06/01/22 1732 06/02/22 0322 06/02/22 0432 06/03/22 0540 06/03/22 0541  NA 124* 125* 125* 125*  --  125*  --  126* 126*  --  129*  K 4.9 4.5 4.8 4.8  --  4.7  --  4.8 4.7  --  4.8  CL 94* 93* 92*  --   --   --   --   --  92*  --  95*  CO2 20* 23 19*  --   --   --   --   --  19*  --  20*  GLUCOSE 153* 160* 162*  --   --   --   --   --  258*  --  251*  BUN 41* 44* 54*  --   --   --   --   --  63*  --  77*  CREATININE 2.24* 2.16* 2.32*  --   --   --   --   --  2.15*  --  1.97*  CALCIUM 8.0* 8.3* 8.9  --   --   --   --   --  9.1  --  9.0  MG  --   --  2.4  --  2.3  --  2.4  --  2.6* 2.7*  --   PHOS  --   --   --   --  5.6*  --  6.2*  --  5.2* 4.1  --    GFR: Estimated Creatinine Clearance: 58.3 mL/min (A) (by C-G formula based on SCr of 1.97 mg/dL (H)). Recent Labs  Lab 05/30/22 2209 05/30/22 2328 05/31/22 0444  05/31/22 0943 06/01/22 0610 06/02/22 0432 06/03/22 0541  WBC  --   --  9.7  --  18.4* 14.3* 12.5*  LATICACIDVEN 4.2* 2.9* 2.0* 1.8  --   --   --     Liver Function Tests: Recent Labs  Lab 05/30/22 2150  05/31/22 0444 06/01/22 0610  AST 99* 85* 75*  ALT 72* 73* 61*  ALKPHOS 449* 456* 406*  BILITOT 5.6* 5.4* 5.4*  PROT 5.5* 6.1* 6.7  ALBUMIN 1.7* 1.8* 3.2*   Recent Labs  Lab 05/30/22 2150  LIPASE 58*   Recent Labs  Lab 05/30/22 2328 06/02/22 0806  AMMONIA 99* 39*    ABG    Component Value Date/Time   PHART 7.341 (L) 06/02/2022 0322   PCO2ART 39.4 06/02/2022 0322   PO2ART 180 (H) 06/02/2022 0322   HCO3 21.4 06/02/2022 0322   TCO2 23 06/02/2022 0322   ACIDBASEDEF 4.0 (H) 06/02/2022 0322   O2SAT 100 06/02/2022 0322     Coagulation Profile: Recent Labs  Lab 05/30/22 2328 05/31/22 0444  INR 1.2 1.3*    Cardiac Enzymes: No results for input(s): "CKTOTAL", "CKMB", "CKMBINDEX", "TROPONINI" in the last 168 hours.  HbA1C: Hemoglobin A1C  Date/Time Value Ref Range Status  03/05/2022 10:13 AM 6.1 (A) 4.0 - 5.6 % Final  07/09/2021 12:13 PM 5.4 4.0 - 5.6 % Final  09/07/2018 12:00 AM 6.5  Final   Hgb A1c MFr Bld  Date/Time Value Ref Range Status  05/16/2012 04:48 AM 8.7 (H) <5.7 % Final    Comment:    (NOTE)                                                                       According to the ADA Clinical Practice Recommendations for 2011, when HbA1c is used as a screening test:  >=6.5%   Diagnostic of Diabetes Mellitus           (if abnormal result is confirmed) 5.7-6.4%   Increased risk of developing Diabetes Mellitus References:Diagnosis and Classification of Diabetes Mellitus,Diabetes Care,2011,34(Suppl 1):S62-S69 and Standards of Medical Care in         Diabetes - 2011,Diabetes [id number] (Suppl 1):S11-S61.  05/06/2011 09:15 PM 6.1 (H) <5.7 % Final    Comment:    (NOTE)                                                                       According to the ADA Clinical Practice Recommendations for 2011, when HbA1c is used as a screening test:  >=6.5%   Diagnostic of Diabetes Mellitus           (if abnormal result is confirmed) 5.7-6.4%   Increased risk  of developing Diabetes Mellitus References:Diagnosis and Classification of Diabetes Mellitus,Diabetes [id number] 1):S62-S69 and Standards of Medical Care in         Diabetes - 2011,Diabetes Care,2011,34 (Suppl 1):S11-S61.    CBG: Recent Labs  Lab 06/02/22 1546 06/02/22 1917 06/02/22 2308 06/03/22 0312 06/03/22 0723  GLUCAP  251* 242* 249* 230* 237*    Review of Systems:   Unable to provide history  Past Medical History:  He,  has a past medical history of Abnormal laboratory test result (07/04/2011), Anxiety associated with depression (04/19/2011), Chronic pain syndrome (04/19/2011), Chronic venous insufficiency (2013), COPD (chronic obstructive pulmonary disease) ([place]), Diabetes mellitus, Edema (04/19/2011), Elevated liver function tests (05/17/2011), Encounter for power mobility device assessment (03/14/2020), Erythrocytosis (07/04/2016), Gout, Hepatitis B antibody positive (2013), Hyperlipidemia, Hypertension, Leukocytosis (01/11/2014), NASH (nonalcoholic steatohepatitis), ? some EtOH contribution and fibrosis  (05/17/2011), OA (osteoarthritis), Obesity, Class III, BMI 40-49.9 (morbid obesity) ([place]), Obstructive sleep apnea (08/21/08), RA (rheumatoid arthritis) ([place]) (2013), Shortness of breath, Tobacco abuse, and Wheelchair bound.   Surgical History:   Past Surgical History:  Procedure Laterality Date   ESOPHAGOGASTRODUODENOSCOPY (EGD) WITH PROPOFOL N/A 05/08/2022   Procedure: ESOPHAGOGASTRODUODENOSCOPY (EGD) WITH PROPOFOL;  Surgeon: Daryel November, MD;  Location: [facility] ENDOSCOPY;  Service: Gastroenterology;  Laterality: N/A;   IR PARACENTESIS  05/09/2022   IR PARACENTESIS  05/31/2022   LAMINECTOMY  1995   L4-L5   TONSILLECTOMY     TOTAL HIP ARTHROPLASTY Left 2009     Social History:   reports that he has been smoking cigarettes. He has a 3.00 pack-year smoking history. He has never used smokeless tobacco. He reports that he does not drink alcohol and does not use drugs.    Family History:  His family history includes Diabetes in his father and mother; Heart attack in his mother; Heart disease in his mother; Kidney disease in his father; Obesity in his brother.   Allergies Not on File   Sanjuan Dame, MD Internal Medicine PGY-3 Pager: [phone]

## 2022-06-04 ENCOUNTER — Inpatient Hospital Stay (HOSPITAL_COMMUNITY): Payer: Medicaid Other

## 2022-06-04 DIAGNOSIS — K7290 Hepatic failure, unspecified without coma: Secondary | ICD-10-CM | POA: Diagnosis not present

## 2022-06-04 DIAGNOSIS — J9601 Acute respiratory failure with hypoxia: Secondary | ICD-10-CM | POA: Diagnosis not present

## 2022-06-04 DIAGNOSIS — N179 Acute kidney failure, unspecified: Secondary | ICD-10-CM | POA: Diagnosis not present

## 2022-06-04 DIAGNOSIS — K7460 Unspecified cirrhosis of liver: Secondary | ICD-10-CM | POA: Diagnosis not present

## 2022-06-04 LAB — CBC
HCT: 32 % — ABNORMAL LOW (ref 39.0–52.0)
Hemoglobin: 11.7 g/dL — ABNORMAL LOW (ref 13.0–17.0)
MCH: 33.1 pg (ref 26.0–34.0)
MCHC: 36.6 g/dL — ABNORMAL HIGH (ref 30.0–36.0)
MCV: 90.7 fL (ref 80.0–100.0)
Platelets: 56 10*3/uL — ABNORMAL LOW (ref 150–400)
RBC: 3.53 MIL/uL — ABNORMAL LOW (ref 4.22–5.81)
RDW: 18.6 % — ABNORMAL HIGH (ref 11.5–15.5)
WBC: 12.1 10*3/uL — ABNORMAL HIGH (ref 4.0–10.5)
nRBC: 0 % (ref 0.0–0.2)

## 2022-06-04 LAB — LEGIONELLA PNEUMOPHILA SEROGP 1 UR AG: L. pneumophila Serogp 1 Ur Ag: NEGATIVE

## 2022-06-04 LAB — BASIC METABOLIC PANEL
Anion gap: 9 (ref 5–15)
BUN: 100 mg/dL — ABNORMAL HIGH (ref 8–23)
CO2: 21 mmol/L — ABNORMAL LOW (ref 22–32)
Calcium: 8.7 mg/dL — ABNORMAL LOW (ref 8.9–10.3)
Chloride: 99 mmol/L (ref 98–111)
Creatinine, Ser: 2.08 mg/dL — ABNORMAL HIGH (ref 0.61–1.24)
GFR, Estimated: 36 mL/min — ABNORMAL LOW (ref >60)
Glucose, Bld: 237 mg/dL — ABNORMAL HIGH (ref 70–99)
Potassium: 5.1 mmol/L (ref 3.5–5.1)
Sodium: 129 mmol/L — ABNORMAL LOW (ref 135–145)

## 2022-06-04 LAB — GLUCOSE, CAPILLARY
Glucose-Capillary: 209 mg/dL — ABNORMAL HIGH (ref 70–99)
Glucose-Capillary: 242 mg/dL — ABNORMAL HIGH (ref 70–99)
Glucose-Capillary: 241 mg/dL — ABNORMAL HIGH (ref 70–99)
Glucose-Capillary: 257 mg/dL — ABNORMAL HIGH (ref 70–99)
Glucose-Capillary: 267 mg/dL — ABNORMAL HIGH (ref 70–99)
Glucose-Capillary: 214 mg/dL — ABNORMAL HIGH (ref 70–99)
Glucose-Capillary: 269 mg/dL — ABNORMAL HIGH (ref 70–99)

## 2022-06-04 LAB — MAGNESIUM: Magnesium: 2.6 mg/dL — ABNORMAL HIGH (ref 1.7–2.4)

## 2022-06-04 LAB — PHOSPHORUS: Phosphorus: 3.8 mg/dL (ref 2.5–4.6)

## 2022-06-04 MED ORDER — BISACODYL 10 MG RE SUPP
10.0000 mg | Freq: Once | RECTAL | Status: AC
  Administered 2022-06-04: 10 mg via RECTAL
  Filled 2022-06-04: qty 1

## 2022-06-04 MED ORDER — OXYCODONE HCL 5 MG PO TABS
5.0000 mg | ORAL_TABLET | Freq: Three times a day (TID) | ORAL | Status: DC
  Administered 2022-06-04 – 2022-06-07 (×9): 5 mg
  Filled 2022-06-04 (×9): qty 1

## 2022-06-04 MED ORDER — MIDODRINE HCL 5 MG PO TABS
10.0000 mg | ORAL_TABLET | Freq: Three times a day (TID) | ORAL | Status: DC
  Administered 2022-06-04 – 2022-06-07 (×10): 10 mg
  Filled 2022-06-04 (×11): qty 2

## 2022-06-04 MED ORDER — FUROSEMIDE 10 MG/ML IJ SOLN
40.0000 mg | Freq: Three times a day (TID) | INTRAMUSCULAR | Status: AC
  Administered 2022-06-04 (×2): 40 mg via INTRAVENOUS
  Filled 2022-06-04 (×2): qty 4

## 2022-06-04 MED ORDER — INSULIN GLARGINE-YFGN 100 UNIT/ML ~~LOC~~ SOLN
15.0000 [IU] | Freq: Every day | SUBCUTANEOUS | Status: DC
  Administered 2022-06-04: 15 [IU] via SUBCUTANEOUS
  Filled 2022-06-04 (×2): qty 0.15

## 2022-06-04 MED ORDER — INSULIN ASPART 100 UNIT/ML IJ SOLN
10.0000 [IU] | INTRAMUSCULAR | Status: DC
  Administered 2022-06-04 – 2022-06-07 (×11): 10 [IU] via SUBCUTANEOUS

## 2022-06-04 MED ORDER — INSULIN ASPART 100 UNIT/ML IJ SOLN
8.0000 [IU] | INTRAMUSCULAR | Status: DC
  Administered 2022-06-04 (×2): 8 [IU] via SUBCUTANEOUS

## 2022-06-04 MED ORDER — MIDAZOLAM HCL 2 MG/2ML IJ SOLN
4.0000 mg | Freq: Once | INTRAMUSCULAR | Status: AC
  Administered 2022-06-04: 4 mg via INTRAVENOUS
  Filled 2022-06-04: qty 4

## 2022-06-04 MED ORDER — QUETIAPINE FUMARATE 50 MG PO TABS
50.0000 mg | ORAL_TABLET | Freq: Two times a day (BID) | ORAL | Status: DC
  Administered 2022-06-04 – 2022-06-07 (×8): 50 mg
  Filled 2022-06-04 (×8): qty 1

## 2022-06-04 NOTE — Progress Notes (Signed)
eLink Physician-Brief Progress Note Patient Name: Andrew Fowler DOB: 1960/09/30 MRN: [id number]   Date of Service  06/04/2022  HPI/Events of Note  pt is maxed on fentyl, received prns, and is almost maxed on precedex.  Has been dysynchronous with the vent since his bath. ET tube does not appear to be moved, but pt is doing abdominal breathing. Nurse does not hear a cuff leak.  eICU Interventions  Versed x1  CXR  Appearance of breath stacking, but no obvious agitation  Responded appropriately to Versed with improvement in vent synchrony     Intervention Category Minor Interventions: Agitation / anxiety - evaluation and management  Malley Hauter 06/04/2022, 1:07 AM

## 2022-06-04 NOTE — Telephone Encounter (Signed)
Patient is currently in ICU and not sure what will happen with this medication, will hold off refilling at this time

## 2022-06-04 NOTE — Progress Notes (Signed)
Inpatient Diabetes Program Recommendations  AACE/ADA: New Consensus Statement on Inpatient Glycemic Control (2015)  Target Ranges:  Prepandial:   less than 140 mg/dL      Peak postprandial:   less than 180 mg/dL (1-2 hours)      Critically ill patients:  140 - 180 mg/dL   Lab Results  Component Value Date   GLUCAP 257 (H) 06/04/2022   HGBA1C 6.1 (A) 03/05/2022    Review of Glycemic Control  Latest Reference Range & Units 06/03/22 11:45 06/03/22 15:16 06/03/22 19:15 06/03/22 23:08 06/04/22 03:03 06/04/22 04:12 06/04/22 07:34 06/04/22 11:08  Glucose-Capillary 70 - 99 mg/dL 261 (H) 257 (H) 227 (H) 257 (H) 209 (H) 242 (H) 241 (H) 257 (H)  (H): Data is abnormally high Diabetes history: DM  Outpatient Diabetes medications: None Current orders for Inpatient glycemic control:  Novolog resistant q 4 hours Novolog 8 units q 4 hours (tube feed coverage) Vital 55 ml/hr Semglee 15 units daily Solumedrol 80 mg IV daily  Inpatient Diabetes Program Recommendations:    Consider increasing Novolog tube feed coverage to 10 units q 4 hours and consider increasing Semglee to 20 units daily.  Thanks,  Adah Perl, RN, BC-ADM Inpatient Diabetes Coordinator Pager [phone]  (8a-5p)

## 2022-06-04 NOTE — Progress Notes (Addendum)
NAME:  Andrew Fowler, MRN:  [id number], DOB:  [birth year], LOS: 4 ADMISSION DATE:  05/30/2022, CONSULTATION DATE:  06/02/2022 REFERRING MD:  Dr Markus Jarvis, CHIEF COMPLAINT:   Acute hypoxic respiratory failure  History of Present Illness:  Patient is a 62 year old male with pertinent PMH NASH cirrhosis, COPD, OSA, RA, T2DM, severe obesity presents to [facility] ED on 3/7 with SOB.   Patient states he has been having progressively worsening SOB over the past 10 days.  States he has noticed more wheezing and productive cough.  Denies fever/chills.  Also noticed more swelling in legs and body and admits to gaining weight.  States he has been taking his home medications faithfully.  On 3/7 came to [facility] ED for further eval.  Vital stable.  Sats 95% on room air.  Labs showing significant hyponatremia, AKI, elevated liver enzymes , low albumin, LA 4.2 then 2.9.  Chronic thrombocytopenia 104.  Patient was given IV Lasix and some breathing treatments which helped with breathing.  Started on steroids.  CXR showing perihilar opacities.  Started on rocephin/azithromycin. On 3/8 paracentesis was performed pulling 2.4L of clear yellow fluid.  Albumin given.  Spironolactone/Lasix held due to elevated creatinine.   On 3/9 patient with worsening respiratory distress at 2 AM.  Patient sats dropped to 76% on 2 LNC.  Patient was placed on NRB with improvement in sats.  CXR showing worsening infiltrate versus edema bilaterally.  Patient transferred to progressive and given Lasix.  Patient only put out 300 mL UOP.  ABG 7.31, 42, 111, 21.  Patient with sats only 88% on NRB and still in respiratory distress.  PCCM consulted.  Pertinent  Medical History   Past Medical History:  Diagnosis Date   Abnormal laboratory test result 07/04/2011   Immunofixation electrophoresis 3/13 showed slightly restricted mobility in the IgG and kappa lanes and suggested repeat end of year.    Anxiety associated with depression 04/19/2011   On chronic benzos  and SSRI's. Gets panic attacks. Does not see mental health.   Chronic pain syndrome 04/19/2011   Combination of RA, obesity, OA, & DDD. Has seen Dr Sharol Given & s/p R total hip arthroplasty 2/2 OA & ? AVN. s/p L4-L5 laminectomy. Lumbar MRI 4/11 : L3-L4 mod central canal narrowing.  Cervical MRI 6/11 : multi-level DDD and L foraminal stenosis at C4-5 and C5-6.    Chronic venous insufficiency 2013   ECHO 2013 was normal. LFT's, creatinine, TSH all normal. Alb a bit low.   COPD (chronic obstructive pulmonary disease) ([place])    Per pt, he has a diagnosis of COPD. No PFT's. Uses Alb MDI less than once a month.   Diabetes mellitus    Non-insulin dependent Type II.   Edema 04/19/2011   ECHO 2013 was normal. LFT's, creatinine, TSH all normal. Alb a bit low. Likely 2/2 chronic venous insuff 2/2 weight.   Elevated liver function tests 05/17/2011   04/2010. Increased GGT (pt uses ETOH). AMA negative. ABD U/S limited but otherwise negative. Follow Alk phos level. AST & ALT also elevated. False + IgM Hep B Core Ab. Hep B viral load negative.   Same pattern during hospitalization 2010 : highest alkaline phosphatase was 355, AST 259, and ALT 871. ANA, rheumatoid factor, ceruloplasmin, CMV IgM, alpha antitrypsin, and AMA, all within normal limits.   Encounter for power mobility device assessment 03/14/2020   Erythrocytosis 07/04/2016   Gout    Pt has never had a crystal diagnosis.   Hepatitis B antibody positive  2013   IgM Hep B core Ab + but Hepatitis B viral load negative.    Hyperlipidemia    At goal of LDL <100 with statin.   Hypertension    ACEI monotherapy   Leukocytosis 01/11/2014   Since 2010. Smear 2015 mild left shift. Is on prednisone for RA but increase started before leukocytosis.    NASH (nonalcoholic steatohepatitis), ? some EtOH contribution and fibrosis  05/17/2011   Elevated since 05/2008 during hospitalization. Relatively stable. Extensive W/U and no etiology but likely fatty liver 2/2 obesity despite  normal imaging.  Hepatitis - all negative. There was an initial + IgM Hep B core Ab but repeat testing was negative and Hep B viral load was negative.  TTG 06/15/2013  ANA x 2 (05/07/11 and 06/13/2008) ASMA x2 (07/18/2011 negative & weakly + at 21 on 05/27/2011 & nega   OA (osteoarthritis)    Obesity, Class III, BMI 40-49.9 (morbid obesity) ([place])    Obstructive sleep apnea 08/21/08   Sleep study AHI 16.4 with desat to 66%. CPAP of 17 decreased AHI to 0.9. Non-compliant with CPAP.   RA (rheumatoid arthritis) ([place]) 2013   Shortness of breath    Tobacco abuse    Per pt, he has a diagnosis of COPD. Need to locate PFT's.   Wheelchair bound    Significant Hospital Events: Including procedures, antibiotic start and stop dates in addition to other pertinent events   3/7 admitted to [facility] for shortness of breath, admitted to floors 3/9 worsening respiratory distress with hypoxia, transferred to ICU, started on BiPAP.  Respiratory worsening,-intubated.  Requiring pressors  Interim History / Subjective:  Continues to be agitated, especially after bath last night. Continues to require fentanyl and versed pushes overnight.   Objective   Blood pressure (!) 118/58, pulse 77, temperature 98.8 F (37.1 C), temperature source Oral, resp. rate 20, height '5\' 11"'$  (1.803 m), weight (!) 149.9 kg, SpO2 95 %. CVP:  [15 mmHg-16 mmHg] 16 mmHg  Vent Mode: PRVC FiO2 (%):  [40 %] 40 % Set Rate:  [20 bmp] 20 bmp Vt Set:  [600 mL] 600 mL PEEP:  [8 cmH20-10 cmH20] 8 cmH20 Plateau Pressure:  [23 cmH20-29 cmH20] 27 cmH20   Intake/Output Summary (Last 24 hours) at 06/04/2022 0640 Last data filed at 06/04/2022 0600 Gross per 24 hour  Intake 2907.63 ml  Output 1000 ml  Net 1907.63 ml    Filed Weights   06/02/22 0500 06/03/22 0314 06/04/22 0500  Weight: (!) 148.9 kg (!) 148.9 kg (!) 149.9 kg    Examination: General: Chronically ill-appearing, laying in bed in no acute distress HENT: Endotracheal tube in place, moist oral  mucosa, Lungs:Normal work of breathing. Rhonchi bilaterally, no wheezing appreciated.  Cardiovascular: Regular rate, rhythm. No murmurs. Warm extremities.  Abdomen: Soft, normoactive bowel sounds.  Extremities: 2-3+ pitting edema bilaterally to hips Neuro: Somnolent, but arousable, not following directions  Resolved Hospital Problem list    Assessment & Plan:  #Acute hypoxemic respiratory failure #Acute hypercapnic respiratory failure #COPD exacerbation, possible community-acquired pneumonia Overall appears to be improving from respiratory standpoint, requiring less ventilatory support. However, mental status will be biggest barrier for extubation. Will plan to diurese today as well.  -Continue full ventilatory support, FiO2 down to 40% this morning -Continue DuoNebs every 4 hours -Continue Solumedrol '80mg'$  (day 4) -Last day of ceftriaxone (day 5) -Target PaO2 of 55-65 -VAP protocol in place  -Minimal pressors required, wean as able  #Decompensated cirrhosis secondary to NASH #Oliguric  acute kidney injury Urine output 0.3cc/kg/hr after receiving lasix yesterday. Patient is still very edematous, likely third-spacing. Will push diuresis today.  -IV furosemide '40mg'$  twice dialy -Start midodrine '10mg'$  q8h -metolazone for 3 days -Hold spironolactone, furosemide  #Agitation Continues to require pushes of fentanyl and versed with Precedex/Fentanyl drips. Started seroquel yesterday, will increase dose today and start oxycodone.  - Seroquel '50mg'$  twice daily - Start oxycodone '5mg'$  every 8 hours - Continue fentanyl, Precedex for agitation  - Fentanyl, versed PRN  #Type II diabetes mellitus On tube feeds and IV steroids, sugars have remained elevated in 200's, will adjust insulin regimen today.  - Increase semglee 15 units - Increase Novolog 8u + SSI q4h  #Rheumatoid arthritis -Continue IV steroids -Plaquenil on hold  #Chronic thrombocytopenia #Anemia -Trend CBC  #Tobacco use  disorder -Continue nicotine patch  Best Practice (right click and "Reselect all SmartList Selections" daily)   Diet/type: tubefeeds DVT prophylaxis: prophylactic heparin  GI prophylaxis: PPI Lines: Central line Foley:  Yes, and it is still needed Code Status:  full code Last date of multidisciplinary goals of care discussion [discussed with patient's brother 06/03/2022]  Labs   CBC: Recent Labs  Lab 05/30/22 2150 05/31/22 0444 06/01/22 0610 06/01/22 0731 06/01/22 1104 06/02/22 0322 06/02/22 0432 06/03/22 0541 06/04/22 0429  WBC 12.5* 9.7 18.4*  --   --   --  14.3* 12.5* 12.1*  NEUTROABS 10.4*  --  15.3*  --   --   --   --   --   --   HGB 12.4* 13.1 12.6*   < > 12.6* 12.9* 11.8* 11.4* 11.7*  HCT 35.3* 34.9* 34.2*   < > 37.0* 38.0* 32.5* 32.8* 32.0*  MCV 93.1 88.1 89.8  --   --   --  90.3 92.1 90.7  PLT 104* 82* 100*  --   --   --  64* 70* 56*   < > = values in this interval not displayed.     Basic Metabolic Panel: Recent Labs  Lab 05/31/22 0444 05/31/22 0444 06/01/22 0610 06/01/22 0731 06/01/22 0937 06/01/22 1104 06/01/22 1732 06/02/22 0322 06/02/22 0432 06/03/22 0540 06/03/22 0541 06/04/22 0429  NA 125*  --  125*   < >  --  125*  --  126* 126*  --  129* 129*  K 4.5  --  4.8   < >  --  4.7  --  4.8 4.7  --  4.8 5.1  CL 93*  --  92*  --   --   --   --   --  92*  --  95* 99  CO2 23  --  19*  --   --   --   --   --  19*  --  20* 21*  GLUCOSE 160*  --  162*  --   --   --   --   --  258*  --  251* 237*  BUN 44*  --  54*  --   --   --   --   --  63*  --  77* 100*  CREATININE 2.16*  --  2.32*  --   --   --   --   --  2.15*  --  1.97* 2.08*  CALCIUM 8.3*  --  8.9  --   --   --   --   --  9.1  --  9.0 8.7*  MG  --    < > 2.4  --  2.3  --  2.4  --  2.6* 2.7*  --  2.6*  PHOS  --   --   --   --  5.6*  --  6.2*  --  5.2* 4.1  --  3.8   < > = values in this interval not displayed.    GFR: Estimated Creatinine Clearance: 55.4 mL/min (A) (by C-G formula based on SCr of  2.08 mg/dL (H)). Recent Labs  Lab 05/30/22 2209 05/30/22 2328 05/31/22 0444 05/31/22 0943 06/01/22 0610 06/02/22 0432 06/03/22 0541 06/04/22 0429  WBC  --   --  9.7  --  18.4* 14.3* 12.5* 12.1*  LATICACIDVEN 4.2* 2.9* 2.0* 1.8  --   --   --   --      Liver Function Tests: Recent Labs  Lab 05/30/22 2150 05/31/22 0444 06/01/22 0610  AST 99* 85* 75*  ALT 72* 73* 61*  ALKPHOS 449* 456* 406*  BILITOT 5.6* 5.4* 5.4*  PROT 5.5* 6.1* 6.7  ALBUMIN 1.7* 1.8* 3.2*    Recent Labs  Lab 05/30/22 2150  LIPASE 58*    Recent Labs  Lab 05/30/22 2328 06/02/22 0806  AMMONIA 99* 39*     ABG    Component Value Date/Time   PHART 7.341 (L) 06/02/2022 0322   PCO2ART 39.4 06/02/2022 0322   PO2ART 180 (H) 06/02/2022 0322   HCO3 21.4 06/02/2022 0322   TCO2 23 06/02/2022 0322   ACIDBASEDEF 4.0 (H) 06/02/2022 0322   O2SAT 100 06/02/2022 0322     Coagulation Profile: Recent Labs  Lab 05/30/22 2328 05/31/22 0444  INR 1.2 1.3*     Cardiac Enzymes: No results for input(s): "CKTOTAL", "CKMB", "CKMBINDEX", "TROPONINI" in the last 168 hours.  HbA1C: Hemoglobin A1C  Date/Time Value Ref Range Status  03/05/2022 10:13 AM 6.1 (A) 4.0 - 5.6 % Final  07/09/2021 12:13 PM 5.4 4.0 - 5.6 % Final  09/07/2018 12:00 AM 6.5  Final   Hgb A1c MFr Bld  Date/Time Value Ref Range Status  05/16/2012 04:48 AM 8.7 (H) <5.7 % Final    Comment:    (NOTE)                                                                       According to the ADA Clinical Practice Recommendations for 2011, when HbA1c is used as a screening test:  >=6.5%   Diagnostic of Diabetes Mellitus           (if abnormal result is confirmed) 5.7-6.4%   Increased risk of developing Diabetes Mellitus References:Diagnosis and Classification of Diabetes Mellitus,Diabetes [id number] 1):S62-S69 and Standards of Medical Care in         Diabetes - 2011,Diabetes [id number] (Suppl 1):S11-S61.  05/06/2011 09:15 PM 6.1  (H) <5.7 % Final    Comment:    (NOTE)                                                                       According to the  ADA Clinical Practice Recommendations for 2011, when HbA1c is used as a screening test:  >=6.5%   Diagnostic of Diabetes Mellitus           (if abnormal result is confirmed) 5.7-6.4%   Increased risk of developing Diabetes Mellitus References:Diagnosis and Classification of Diabetes Mellitus,Diabetes [id number] 1):S62-S69 and Standards of Medical Care in         Diabetes - 2011,Diabetes [id number] (Suppl 1):S11-S61.    CBG: Recent Labs  Lab 06/03/22 1516 06/03/22 1915 06/03/22 2308 06/04/22 0303 06/04/22 0412  GLUCAP 257* 227* 257* 209* 242*     Review of Systems:   Unable to provide history  Past Medical History:  He,  has a past medical history of Abnormal laboratory test result (07/04/2011), Anxiety associated with depression (04/19/2011), Chronic pain syndrome (04/19/2011), Chronic venous insufficiency (2013), COPD (chronic obstructive pulmonary disease) ([place]), Diabetes mellitus, Edema (04/19/2011), Elevated liver function tests (05/17/2011), Encounter for power mobility device assessment (03/14/2020), Erythrocytosis (07/04/2016), Gout, Hepatitis B antibody positive (2013), Hyperlipidemia, Hypertension, Leukocytosis (01/11/2014), NASH (nonalcoholic steatohepatitis), ? some EtOH contribution and fibrosis  (05/17/2011), OA (osteoarthritis), Obesity, Class III, BMI 40-49.9 (morbid obesity) ([place]), Obstructive sleep apnea (08/21/08), RA (rheumatoid arthritis) ([place]) (2013), Shortness of breath, Tobacco abuse, and Wheelchair bound.   Surgical History:   Past Surgical History:  Procedure Laterality Date   ESOPHAGOGASTRODUODENOSCOPY (EGD) WITH PROPOFOL N/A 05/08/2022   Procedure: ESOPHAGOGASTRODUODENOSCOPY (EGD) WITH PROPOFOL;  Surgeon: Daryel November, MD;  Location: [facility] ENDOSCOPY;  Service: Gastroenterology;  Laterality: N/A;   IR PARACENTESIS  05/09/2022    IR PARACENTESIS  05/31/2022   LAMINECTOMY  1995   L4-L5   TONSILLECTOMY     TOTAL HIP ARTHROPLASTY Left 2009     Social History:   reports that he has been smoking cigarettes. He has a 3.00 pack-year smoking history. He has never used smokeless tobacco. He reports that he does not drink alcohol and does not use drugs.   Family History:  His family history includes Diabetes in his father and mother; Heart attack in his mother; Heart disease in his mother; Kidney disease in his father; Obesity in his brother.   Allergies Not on File   Sanjuan Dame, MD Internal Medicine PGY-3 Pager: [phone]

## 2022-06-05 DIAGNOSIS — J9601 Acute respiratory failure with hypoxia: Secondary | ICD-10-CM | POA: Diagnosis not present

## 2022-06-05 DIAGNOSIS — K7460 Unspecified cirrhosis of liver: Secondary | ICD-10-CM | POA: Diagnosis not present

## 2022-06-05 DIAGNOSIS — K7290 Hepatic failure, unspecified without coma: Secondary | ICD-10-CM | POA: Diagnosis not present

## 2022-06-05 LAB — RENAL FUNCTION PANEL
Albumin: 2.9 g/dL — ABNORMAL LOW (ref 3.5–5.0)
Albumin: 2.8 g/dL — ABNORMAL LOW (ref 3.5–5.0)
Anion gap: 9 (ref 5–15)
Anion gap: 9 (ref 5–15)
BUN: 120 mg/dL — ABNORMAL HIGH (ref 8–23)
BUN: 130 mg/dL — ABNORMAL HIGH (ref 8–23)
CO2: 21 mmol/L — ABNORMAL LOW (ref 22–32)
CO2: 22 mmol/L (ref 22–32)
Calcium: 8.7 mg/dL — ABNORMAL LOW (ref 8.9–10.3)
Calcium: 8.6 mg/dL — ABNORMAL LOW (ref 8.9–10.3)
Chloride: 101 mmol/L (ref 98–111)
Chloride: 101 mmol/L (ref 98–111)
Creatinine, Ser: 2.08 mg/dL — ABNORMAL HIGH (ref 0.61–1.24)
Creatinine, Ser: 2.03 mg/dL — ABNORMAL HIGH (ref 0.61–1.24)
GFR, Estimated: 36 mL/min — ABNORMAL LOW (ref >60)
GFR, Estimated: 37 mL/min — ABNORMAL LOW (ref >60)
Glucose, Bld: 301 mg/dL — ABNORMAL HIGH (ref 70–99)
Glucose, Bld: 296 mg/dL — ABNORMAL HIGH (ref 70–99)
Phosphorus: 3.6 mg/dL (ref 2.5–4.6)
Phosphorus: 4.5 mg/dL (ref 2.5–4.6)
Potassium: 5.3 mmol/L — ABNORMAL HIGH (ref 3.5–5.1)
Potassium: 5.9 mmol/L — ABNORMAL HIGH (ref 3.5–5.1)
Sodium: 131 mmol/L — ABNORMAL LOW (ref 135–145)
Sodium: 132 mmol/L — ABNORMAL LOW (ref 135–145)

## 2022-06-05 LAB — CBC
HCT: 31.6 % — ABNORMAL LOW (ref 39.0–52.0)
Hemoglobin: 11.4 g/dL — ABNORMAL LOW (ref 13.0–17.0)
MCH: 32.7 pg (ref 26.0–34.0)
MCHC: 36.1 g/dL — ABNORMAL HIGH (ref 30.0–36.0)
MCV: 90.5 fL (ref 80.0–100.0)
Platelets: 53 10*3/uL — ABNORMAL LOW (ref 150–400)
RBC: 3.49 MIL/uL — ABNORMAL LOW (ref 4.22–5.81)
RDW: 18.6 % — ABNORMAL HIGH (ref 11.5–15.5)
WBC: 13.6 10*3/uL — ABNORMAL HIGH (ref 4.0–10.5)
nRBC: 0 % (ref 0.0–0.2)

## 2022-06-05 LAB — GLUCOSE, CAPILLARY
Glucose-Capillary: 307 mg/dL — ABNORMAL HIGH (ref 70–99)
Glucose-Capillary: 313 mg/dL — ABNORMAL HIGH (ref 70–99)
Glucose-Capillary: 265 mg/dL — ABNORMAL HIGH (ref 70–99)
Glucose-Capillary: 287 mg/dL — ABNORMAL HIGH (ref 70–99)
Glucose-Capillary: 283 mg/dL — ABNORMAL HIGH (ref 70–99)
Glucose-Capillary: 309 mg/dL — ABNORMAL HIGH (ref 70–99)

## 2022-06-05 LAB — CULTURE, BODY FLUID W GRAM STAIN -BOTTLE: Culture: NO GROWTH

## 2022-06-05 LAB — PROTIME-INR
INR: 1.5 — ABNORMAL HIGH (ref 0.8–1.2)
Prothrombin Time: 18.2 seconds — ABNORMAL HIGH (ref 11.4–15.2)

## 2022-06-05 MED ORDER — FUROSEMIDE 10 MG/ML IJ SOLN
40.0000 mg | Freq: Three times a day (TID) | INTRAMUSCULAR | Status: AC
  Administered 2022-06-05 (×2): 40 mg via INTRAVENOUS
  Filled 2022-06-05 (×2): qty 4

## 2022-06-05 MED ORDER — INSULIN GLARGINE-YFGN 100 UNIT/ML ~~LOC~~ SOLN
35.0000 [IU] | Freq: Every day | SUBCUTANEOUS | Status: DC
  Administered 2022-06-05 – 2022-06-07 (×3): 35 [IU] via SUBCUTANEOUS
  Filled 2022-06-05 (×4): qty 0.35

## 2022-06-05 MED ORDER — SODIUM ZIRCONIUM CYCLOSILICATE 10 G PO PACK
10.0000 g | PACK | Freq: Once | ORAL | Status: AC
  Administered 2022-06-05: 10 g
  Filled 2022-06-05: qty 1

## 2022-06-05 MED ORDER — SORBITOL 70 % SOLN
30.0000 mL | Freq: Once | Status: AC
  Administered 2022-06-05: 30 mL
  Filled 2022-06-05: qty 30

## 2022-06-05 MED ORDER — INSULIN GLARGINE-YFGN 100 UNIT/ML ~~LOC~~ SOLN
25.0000 [IU] | Freq: Every day | SUBCUTANEOUS | Status: DC
  Filled 2022-06-05: qty 0.25

## 2022-06-05 MED ORDER — PROSOURCE TF20 ENFIT COMPATIBL EN LIQD
60.0000 mL | Freq: Two times a day (BID) | ENTERAL | Status: DC
  Administered 2022-06-05 – 2022-06-07 (×5): 60 mL
  Filled 2022-06-05 (×5): qty 60

## 2022-06-05 NOTE — Progress Notes (Addendum)
NAME:  Andrew Fowler, MRN:  [id number], DOB:  03-Oct-1960, LOS: 5 ADMISSION DATE:  05/30/2022, CONSULTATION DATE:  06/02/2022 REFERRING MD:  Dr Markus Jarvis, CHIEF COMPLAINT:   Acute hypoxic respiratory failure  History of Present Illness:  Patient is a 62 year old male with pertinent PMH NASH cirrhosis, COPD, OSA, RA, T2DM, severe obesity presents to [facility] ED on 3/7 with SOB.   Patient states he has been having progressively worsening SOB over the past 10 days.  States he has noticed more wheezing and productive cough.  Denies fever/chills.  Also noticed more swelling in legs and body and admits to gaining weight.  States he has been taking his home medications faithfully.  On 3/7 came to [facility] ED for further eval.  Vital stable.  Sats 95% on room air.  Labs showing significant hyponatremia, AKI, elevated liver enzymes , low albumin, LA 4.2 then 2.9.  Chronic thrombocytopenia 104.  Patient was given IV Lasix and some breathing treatments which helped with breathing.  Started on steroids.  CXR showing perihilar opacities.  Started on rocephin/azithromycin. On 3/8 paracentesis was performed pulling 2.4L of clear yellow fluid.  Albumin given.  Spironolactone/Lasix held due to elevated creatinine.   On 3/9 patient with worsening respiratory distress at 2 AM.  Patient sats dropped to 76% on 2 LNC.  Patient was placed on NRB with improvement in sats.  CXR showing worsening infiltrate versus edema bilaterally.  Patient transferred to progressive and given Lasix.  Patient only put out 300 mL UOP.  ABG 7.31, 42, 111, 21.  Patient with sats only 88% on NRB and still in respiratory distress.  PCCM consulted.  Pertinent  Medical History   Past Medical History:  Diagnosis Date   Abnormal laboratory test result 07/04/2011   Immunofixation electrophoresis 3/13 showed slightly restricted mobility in the IgG and kappa lanes and suggested repeat end of year.    Anxiety associated with depression 04/19/2011   On chronic benzos  and SSRI's. Gets panic attacks. Does not see mental health.   Chronic pain syndrome 04/19/2011   Combination of RA, obesity, OA, & DDD. Has seen Dr Sharol Given & s/p R total hip arthroplasty 2/2 OA & ? AVN. s/p L4-L5 laminectomy. Lumbar MRI 4/11 : L3-L4 mod central canal narrowing.  Cervical MRI 6/11 : multi-level DDD and L foraminal stenosis at C4-5 and C5-6.    Chronic venous insufficiency 2013   ECHO 2013 was normal. LFT's, creatinine, TSH all normal. Alb a bit low.   COPD (chronic obstructive pulmonary disease) ([place])    Per pt, he has a diagnosis of COPD. No PFT's. Uses Alb MDI less than once a month.   Diabetes mellitus    Non-insulin dependent Type II.   Edema 04/19/2011   ECHO 2013 was normal. LFT's, creatinine, TSH all normal. Alb a bit low. Likely 2/2 chronic venous insuff 2/2 weight.   Elevated liver function tests 05/17/2011   04/2010. Increased GGT (pt uses ETOH). AMA negative. ABD U/S limited but otherwise negative. Follow Alk phos level. AST & ALT also elevated. False + IgM Hep B Core Ab. Hep B viral load negative.   Same pattern during hospitalization 2010 : highest alkaline phosphatase was 355, AST 259, and ALT 871. ANA, rheumatoid factor, ceruloplasmin, CMV IgM, alpha antitrypsin, and AMA, all within normal limits.   Encounter for power mobility device assessment 03/14/2020   Erythrocytosis 07/04/2016   Gout    Pt has never had a crystal diagnosis.   Hepatitis B antibody positive  2013   IgM Hep B core Ab + but Hepatitis B viral load negative.    Hyperlipidemia    At goal of LDL <100 with statin.   Hypertension    ACEI monotherapy   Leukocytosis 01/11/2014   Since 2010. Smear 2015 mild left shift. Is on prednisone for RA but increase started before leukocytosis.    NASH (nonalcoholic steatohepatitis), ? some EtOH contribution and fibrosis  05/17/2011   Elevated since 05/2008 during hospitalization. Relatively stable. Extensive W/U and no etiology but likely fatty liver 2/2 obesity despite  normal imaging.  Hepatitis - all negative. There was an initial + IgM Hep B core Ab but repeat testing was negative and Hep B viral load was negative.  TTG 06/15/2013  ANA x 2 (05/07/11 and 06/13/2008) ASMA x2 (07/18/2011 negative & weakly + at 21 on 05/27/2011 & nega   OA (osteoarthritis)    Obesity, Class III, BMI 40-49.9 (morbid obesity) ([place])    Obstructive sleep apnea 08/21/08   Sleep study AHI 16.4 with desat to 66%. CPAP of 17 decreased AHI to 0.9. Non-compliant with CPAP.   RA (rheumatoid arthritis) ([place]) 2013   Shortness of breath    Tobacco abuse    Per pt, he has a diagnosis of COPD. Need to locate PFT's.   Wheelchair bound    Significant Hospital Events: Including procedures, antibiotic start and stop dates in addition to other pertinent events   3/7 admitted to [facility] for shortness of breath, admitted to floors 3/9 worsening respiratory distress with hypoxia, transferred to ICU, started on BiPAP.  Respiratory worsening,-intubated.  Requiring pressors  Interim History / Subjective:  Agitation improved overnight, seems to be stable from yesterday. Better urine output yesterday with further diuresis.   Objective   Blood pressure (!) 118/55, pulse 83, temperature 98.8 F (37.1 C), temperature source Oral, resp. rate (!) 21, height '5\' 11"'$  (1.803 m), weight (!) 153.5 kg, SpO2 96 %. CVP:  [2 mmHg-14 mmHg] 13 mmHg  Vent Mode: PRVC FiO2 (%):  [40 %] 40 % Set Rate:  [20 bmp] 20 bmp Vt Set:  [600 mL] 600 mL PEEP:  [8 cmH20] 8 cmH20 Plateau Pressure:  [16 cmH20-28 cmH20] 28 cmH20   Intake/Output Summary (Last 24 hours) at 06/05/2022 0953 Last data filed at 06/05/2022 0800 Gross per 24 hour  Intake 2439.66 ml  Output 1900 ml  Net 539.66 ml    Filed Weights   06/03/22 0314 06/04/22 0500 06/05/22 0335  Weight: (!) 148.9 kg (!) 149.9 kg (!) 153.5 kg   Examination: General: Chronically ill-appearing, laying in bed in no acute distress HENT: Endotracheal tube in place, moist oral  mucosa, Lungs:Normal work of breathing. Rhonchi bilaterally, no wheezing.  Cardiovascular: Regular rate, rhythm. No murmurs. Warm extremities.  Abdomen: Soft, normoactive bowel sounds.  Extremities: 2-3+ pitting edema bilaterally to hips Neuro: Somnolent, but arousable, not following directions  Resolved Hospital Problem list    Assessment & Plan:  #Acute hypoxemic, hypercapnic respiratory failure #Pulmonary edema, COPD exacerbation, possible community-acquired pneumonia Respiratory status stable from yesterday. Urine output improved as well. Likely his presentation is mostly due to excess fluid, however he likely has COPD exacerbation which is contributing as well. He has completed 5d course of antibiotics, do not feel as though he needs anymore. We will give one more day of stress steroids for COPD, we will decrease this tomorrow (he is chronically on prednisone '5mg'$ ). He has been weaned off of pressors as well.  -Continue full ventilatory support,  FiO2 down to 40% -Diurese as below -Continue DuoNebs every 4 hours -Continue Solumedrol '80mg'$  (day 5) -Target PaO2 of 55-65 -VAP protocol in place  -Minimal pressors required, wean as able  #Decompensated cirrhosis secondary to NASH #Acute kidney injury After a few doses of IV diuresis yesterday urine output has improved to 0.5cc/kg/hr. Renal function is stable from yesterday as well, we will continue with aggressive diuresis given his overt hypervolemic state. Of note, he previously had paracentesis on 3/8 where 2.4L was removed - prior to this he had a paracentesis on 2/15 which yielded 2.7L. His exam is difficult given body habitus but I do not believe he has large volume ascites at this point that would benefit from paracentesis.  -IV furosemide '40mg'$  x2 today -Continue midodrine '10mg'$  q8h -Strict I/O  #Agitation This has improved since yesterday after adjustment of oral medications. We will try to wean Precedex and fentanyl some today,  hopefully we can extubate in the upcoming days.  - Seroquel '50mg'$  twice daily - Continue oxycodone '5mg'$  every 8 hours - Continue fentanyl, Precedex for agitation  - Fentanyl, versed PRN  #Type II diabetes mellitus Sugars remain elevated in 200-300's with tube feeds and IV steroids. We will increase his long-acting today,  - Increase semglee 35 units - Continue Novolog 10u + SSI q4h  #Constipation Patient has not had bowel movement in six days. Have trialed multiple laxatives including suppository the past two days without success. Plan for sorbitol today. - Docusate, Miralax - Sorbitol 56m x1  #Rheumatoid arthritis -Continue IV steroids -Plaquenil on hold  #Chronic thrombocytopenia #Anemia Platelets holding steady at 53, will continue with prophylactic anticoagulation. Will hold if platelets drop <50.   #Tobacco use disorder -Continue nicotine patch  Best Practice (right click and "Reselect all SmartList Selections" daily)   Diet/type: tubefeeds DVT prophylaxis: prophylactic heparin  GI prophylaxis: PPI Lines: Central line Foley:  Yes, and it is still needed Code Status:  full code Last date of multidisciplinary goals of care discussion: Discussion with brother and sister yesterday 3/12  Labs   CBC: Recent Labs  Lab 05/30/22 2150 05/31/22 0444 06/01/22 0610 06/01/22 0731 06/02/22 0322 06/02/22 0432 06/03/22 0541 06/04/22 0429 06/05/22 0332  WBC 12.5*   < > 18.4*  --   --  14.3* 12.5* 12.1* 13.6*  NEUTROABS 10.4*  --  15.3*  --   --   --   --   --   --   HGB 12.4*   < > 12.6*   < > 12.9* 11.8* 11.4* 11.7* 11.4*  HCT 35.3*   < > 34.2*   < > 38.0* 32.5* 32.8* 32.0* 31.6*  MCV 93.1   < > 89.8  --   --  90.3 92.1 90.7 90.5  PLT 104*   < > 100*  --   --  64* 70* 56* 53*   < > = values in this interval not displayed.     Basic Metabolic Panel: Recent Labs  Lab 06/01/22 0610 06/01/22 0731 06/01/22 0937 06/01/22 1104 06/01/22 1732 06/02/22 0322 06/02/22 0432  06/03/22 0540 06/03/22 0541 06/04/22 0429 06/05/22 0332  NA 125*   < >  --    < >  --  126* 126*  --  129* 129* 131*  K 4.8   < >  --    < >  --  4.8 4.7  --  4.8 5.1 5.3*  CL 92*  --   --   --   --   --  92*  --  95* 99 101  CO2 19*  --   --   --   --   --  19*  --  20* 21* 21*  GLUCOSE 162*  --   --   --   --   --  258*  --  251* 237* 301*  BUN 54*  --   --   --   --   --  63*  --  77* 100* 120*  CREATININE 2.32*  --   --   --   --   --  2.15*  --  1.97* 2.08* 2.08*  CALCIUM 8.9  --   --   --   --   --  9.1  --  9.0 8.7* 8.7*  MG 2.4  --  2.3  --  2.4  --  2.6* 2.7*  --  2.6*  --   PHOS  --    < > 5.6*  --  6.2*  --  5.2* 4.1  --  3.8 3.6   < > = values in this interval not displayed.    GFR: Estimated Creatinine Clearance: 56.2 mL/min (A) (by C-G formula based on SCr of 2.08 mg/dL (H)). Recent Labs  Lab 05/30/22 2209 05/30/22 2328 05/31/22 0444 05/31/22 0943 06/01/22 0610 06/02/22 0432 06/03/22 0541 06/04/22 0429 06/05/22 0332  WBC  --   --  9.7  --    < > 14.3* 12.5* 12.1* 13.6*  LATICACIDVEN 4.2* 2.9* 2.0* 1.8  --   --   --   --   --    < > = values in this interval not displayed.     Liver Function Tests: Recent Labs  Lab 05/30/22 2150 05/31/22 0444 06/01/22 0610 06/05/22 0332  AST 99* 85* 75*  --   ALT 72* 73* 61*  --   ALKPHOS 449* 456* 406*  --   BILITOT 5.6* 5.4* 5.4*  --   PROT 5.5* 6.1* 6.7  --   ALBUMIN 1.7* 1.8* 3.2* 2.9*    Recent Labs  Lab 05/30/22 2150  LIPASE 58*    Recent Labs  Lab 05/30/22 2328 06/02/22 0806  AMMONIA 99* 39*     ABG    Component Value Date/Time   PHART 7.341 (L) 06/02/2022 0322   PCO2ART 39.4 06/02/2022 0322   PO2ART 180 (H) 06/02/2022 0322   HCO3 21.4 06/02/2022 0322   TCO2 23 06/02/2022 0322   ACIDBASEDEF 4.0 (H) 06/02/2022 0322   O2SAT 100 06/02/2022 0322     Coagulation Profile: Recent Labs  Lab 05/30/22 2328 05/31/22 0444 06/05/22 0332  INR 1.2 1.3* 1.5*     Cardiac Enzymes: No results  for input(s): "CKTOTAL", "CKMB", "CKMBINDEX", "TROPONINI" in the last 168 hours.  HbA1C: Hemoglobin A1C  Date/Time Value Ref Range Status  03/05/2022 10:13 AM 6.1 (A) 4.0 - 5.6 % Final  07/09/2021 12:13 PM 5.4 4.0 - 5.6 % Final  09/07/2018 12:00 AM 6.5  Final   Hgb A1c MFr Bld  Date/Time Value Ref Range Status  05/16/2012 04:48 AM 8.7 (H) <5.7 % Final    Comment:    (NOTE)  According to the ADA Clinical Practice Recommendations for 2011, when HbA1c is used as a screening test:  >=6.5%   Diagnostic of Diabetes Mellitus           (if abnormal result is confirmed) 5.7-6.4%   Increased risk of developing Diabetes Mellitus References:Diagnosis and Classification of Diabetes Mellitus,Diabetes Care,2011,34(Suppl 1):S62-S69 and Standards of Medical Care in         Diabetes - 2011,Diabetes [id number] (Suppl 1):S11-S61.  05/06/2011 09:15 PM 6.1 (H) <5.7 % Final    Comment:    (NOTE)                                                                       According to the ADA Clinical Practice Recommendations for 2011, when HbA1c is used as a screening test:  >=6.5%   Diagnostic of Diabetes Mellitus           (if abnormal result is confirmed) 5.7-6.4%   Increased risk of developing Diabetes Mellitus References:Diagnosis and Classification of Diabetes Mellitus,Diabetes [id number] 1):S62-S69 and Standards of Medical Care in         Diabetes - 2011,Diabetes Care,2011,34 (Suppl 1):S11-S61.    CBG: Recent Labs  Lab 06/04/22 1517 06/04/22 1913 06/04/22 2307 06/05/22 0311 06/05/22 0715  GLUCAP 267* 214* 269* 307* 313*     Review of Systems:   Unable to provide history  Past Medical History:  He,  has a past medical history of Abnormal laboratory test result (07/04/2011), Anxiety associated with depression (04/19/2011), Chronic pain syndrome (04/19/2011), Chronic venous insufficiency (2013), COPD (chronic  obstructive pulmonary disease) ([place]), Diabetes mellitus, Edema (04/19/2011), Elevated liver function tests (05/17/2011), Encounter for power mobility device assessment (03/14/2020), Erythrocytosis (07/04/2016), Gout, Hepatitis B antibody positive (2013), Hyperlipidemia, Hypertension, Leukocytosis (01/11/2014), NASH (nonalcoholic steatohepatitis), ? some EtOH contribution and fibrosis  (05/17/2011), OA (osteoarthritis), Obesity, Class III, BMI 40-49.9 (morbid obesity) ([place]), Obstructive sleep apnea (08/21/08), RA (rheumatoid arthritis) ([place]) (2013), Shortness of breath, Tobacco abuse, and Wheelchair bound.   Surgical History:   Past Surgical History:  Procedure Laterality Date   ESOPHAGOGASTRODUODENOSCOPY (EGD) WITH PROPOFOL N/A 05/08/2022   Procedure: ESOPHAGOGASTRODUODENOSCOPY (EGD) WITH PROPOFOL;  Surgeon: Daryel November, MD;  Location: [facility] ENDOSCOPY;  Service: Gastroenterology;  Laterality: N/A;   IR PARACENTESIS  05/09/2022   IR PARACENTESIS  05/31/2022   LAMINECTOMY  1995   L4-L5   TONSILLECTOMY     TOTAL HIP ARTHROPLASTY Left 2009     Social History:   reports that he has been smoking cigarettes. He has a 3.00 pack-year smoking history. He has never used smokeless tobacco. He reports that he does not drink alcohol and does not use drugs.   Family History:  His family history includes Diabetes in his father and mother; Heart attack in his mother; Heart disease in his mother; Kidney disease in his father; Obesity in his brother.   Allergies Not on File   Sanjuan Dame, MD Internal Medicine PGY-3 Pager: [phone]

## 2022-06-05 NOTE — Progress Notes (Signed)
eLink Physician-Brief Progress Note Patient Name: Andrew Fowler DOB: 18-Apr-1960 MRN: [id number]   Date of Service  06/05/2022  HPI/Events of Note  K+ 5.3.  eICU Interventions  Lokelma 10 gm po x 1 ordered.        [name] Jylian Pappalardo 06/05/2022, 4:44 AM

## 2022-06-05 NOTE — Inpatient Diabetes Management (Signed)
Inpatient Diabetes Program Recommendations  AACE/ADA: New Consensus Statement on Inpatient Glycemic Control (2015)  Target Ranges:  Prepandial:   less than 140 mg/dL      Peak postprandial:   less than 180 mg/dL (1-2 hours)      Critically ill patients:  140 - 180 mg/dL   Lab Results  Component Value Date   GLUCAP 313 (H) 06/05/2022   HGBA1C 6.1 (A) 03/05/2022    Review of Glycemic Control  Latest Reference Range & Units 06/04/22 19:13 06/04/22 23:07 06/05/22 03:11 06/05/22 07:15  Glucose-Capillary 70 - 99 mg/dL 214 (H) 269 (H) 307 (H) 313 (H)  (H): Data is abnormally high Diabetes history: DM  Outpatient Diabetes medications: None Current orders for Inpatient glycemic control:  Novolog resistant q 4 hours Novolog 10 units q 4 hours (tube feed coverage) Vital 55 ml/hr Semglee 35 units daily  Solumedrol 80 mg IV daily   Inpatient Diabetes Program Recommendations:    Noted changes made to [facility] this AM. Of note, patient has received >140 units of short acting insulin in the last 24 hours. Anticipate insulin needs to be increased even with changes.   Consider: -further increasing Semglee 20 units BID -Increasing tube feed coverage to Novolog 15 units Q4H (to be stopped or held if tube feeds are stopped).   Thanks, Bronson Curb, MSN, RNC-OB Diabetes Coordinator [phone] (8a-5p)

## 2022-06-05 NOTE — Progress Notes (Signed)
Pt's SPo2 decreased to 60s, placed back on PRVC mode but unable to ventilate. RT attempted to use BVM but ETT occluded with mucus plugs. RT bag lavaged pt x2 and suctioned out copious tan, brown mucus plugs and secretions. Pt's sats increased to 96%. RT placed pt back on ventilator. Pt is tolerating settings well. Vitals are stable. RT will continue to monitor as needed.

## 2022-06-05 NOTE — Telephone Encounter (Signed)
Ok looks like he is improving suspect he will need to stay on PPI after discharge.

## 2022-06-05 NOTE — Progress Notes (Signed)
Nutrition Follow-up  DOCUMENTATION CODES:    (Unable to assess, significant edema (dry weight unknown), NFPE pending)  INTERVENTION:  Continue TF via OGT: Vital 1.5 at 55 ml/hr (1.32L/d) Pro-Source TF20 60 mL BID Regimen provides 2140, 130g of protein, and 1080m/d Continue MVI, thiamine, and folic acid daily  NUTRITION DIAGNOSIS:  Inadequate oral intake related to acute illness as evidenced by NPO status. - remains applicable  GOAL:  Patient will meet greater than or equal to 90% of their needs - progressing, TF at goal  MONITOR:  Vent status, TF tolerance, Labs, Weight trends, Skin  REASON FOR ASSESSMENT:  Consult, Ventilator Enteral/tube feeding initiation and management  ASSESSMENT:  62yo male admitted with acute respiratory failure requiring intubation, decompensated cirrhosis secondary to NASH. PMH NASH cirrhosis, COPD, RA, HTN, tobacco use   Patient is currently intubated on ventilator support. No family at bedside at the time of assessment. Pt still with significant edema at the time of assessment, pitting to above the knee. There is evidence of fat loss, but unable to definitively diagnosis with malnutrition due to edema and no diet hx.   Feeds infusing at goal of 545mh at this time. Will continue current regimen. Pt has not had a BM. Receiving a bowel regimen.   Temp (24hrs), Avg:99.1 F (37.3 C), Min:98.5 F (36.9 C), Max:100.4 F (38 C)   Intake/Output Summary (Last 24 hours) at 06/05/2022 1345 Last data filed at 06/05/2022 0800 Gross per 24 hour  Intake 1841.92 ml  Output 1575 ml  Net 266.92 ml  Net IO Since Admission: 5,881.2 mL [06/05/22 1345]  Nutritionally Relevant Medications: Scheduled Meds:  atorvastatin  40 mg Per Tube Daily   docusate  100 mg Per Tube BID   PROSource TF20  60 mL Per Tube TID   folic acid  1 mg Per Tube Daily   insulin aspart  0-20 Units Subcutaneous Q4H   insulin aspart  10 Units Subcutaneous Q4H   insulin glargine-yfgn   25 Units Subcutaneous Daily   methylPREDNISolone   80 mg Intravenous Daily   multivitamin with minerals  1 tablet Per Tube Daily   pantoprazole IV  40 mg Intravenous Daily   polyethylene glycol  17 g Per Tube BID   thiamine  100 mg Per Tube Daily   Continuous Infusions:  dexmedetomidine (PRECEDEX) IV infusion 0.6 mcg/kg/hr (06/05/22 0700)   feeding supplement (VITAL 1.5 CAL) 55 mL/hr at 06/05/22 0700   norepinephrine (LEVOPHED) Adult infusion Stopped (06/05/22 0519)   Labs Reviewed: Na 131 K 5.3 BUN 102, creatinine 2.08 CBG ranges from 209-313 mg/dL over the last 24 hours  NUTRITION - FOCUSED PHYSICAL EXAM: Flowsheet Row Most Recent Value  Orbital Region Mild depletion  Upper Arm Region Moderate depletion  Thoracic and Lumbar Region No depletion  Buccal Region Mild depletion  Temple Region Mild depletion  Clavicle Bone Region No depletion  Clavicle and Acromion Bone Region No depletion  Scapular Bone Region No depletion  Dorsal Hand Unable to assess  Patellar Region Unable to assess  [pitting edema]  Anterior Thigh Region Unable to assess  [pitting edema]  Posterior Calf Region Unable to assess  [pitting edema]  Edema (RD Assessment) Severe  [pitting edema in both legs to above the knee]  Hair Reviewed  Eyes Unable to assess  Mouth Unable to assess  Skin Reviewed  [scattered pinpoint hemorrhages noted to left shoulder area]  Nails Unable to assess  [mittens]   Diet Order:   Diet Order  Diet NPO time specified Except for: Sips with Meds  Diet effective now                   EDUCATION NEEDS:   Not appropriate for education at this time  Skin:  Skin Assessment: Reviewed RN Assessment  Last BM:  3/7  Height:   Ht Readings from Last 1 Encounters:  06/01/22 '5\' 11"'$  (1.803 m)    Weight:   Wt Readings from Last 1 Encounters:  06/05/22 (!) 153.5 kg    Ideal Body Weight:  78.2 kg  BMI:  Body mass index is 47.2 kg/m.  Estimated Nutritional  Needs:  Kcal:  2000-2200 kcals Protein:  120-150 g/d Fluid:  1.8 L    Ranell Patrick, RD, LDN Clinical Dietitian RD pager # available in [place]  After hours/weekend pager # available in [facility]

## 2022-06-05 NOTE — Progress Notes (Signed)
OT Cancellation Note  Patient Details Name: Andrew Fowler MRN: [id number] DOB: 1960-07-12   Cancelled Treatment:    Reason Eval/Treat Not Completed: Medical issues which prohibited therapy;Patient not medically ready (Will sign off, please re-order OT evaluation when pt is medically appropriate.)  Elliot Cousin 06/05/2022, 3:37 PM

## 2022-06-06 ENCOUNTER — Inpatient Hospital Stay (HOSPITAL_COMMUNITY): Payer: Medicaid Other

## 2022-06-06 DIAGNOSIS — K7460 Unspecified cirrhosis of liver: Secondary | ICD-10-CM | POA: Diagnosis not present

## 2022-06-06 DIAGNOSIS — J9601 Acute respiratory failure with hypoxia: Secondary | ICD-10-CM | POA: Diagnosis not present

## 2022-06-06 DIAGNOSIS — K7290 Hepatic failure, unspecified without coma: Secondary | ICD-10-CM | POA: Diagnosis not present

## 2022-06-06 LAB — POCT I-STAT 7, (LYTES, BLD GAS, ICA,H+H)
Acid-base deficit: 4 mmol/L — ABNORMAL HIGH (ref 0.0–2.0)
Bicarbonate: 23.1 mmol/L (ref 20.0–28.0)
Calcium, Ion: 1.29 mmol/L (ref 1.15–1.40)
HCT: 34 % — ABNORMAL LOW (ref 39.0–52.0)
Hemoglobin: 11.6 g/dL — ABNORMAL LOW (ref 13.0–17.0)
O2 Saturation: 99 %
Patient temperature: 97.9
Potassium: 5.8 mmol/L — ABNORMAL HIGH (ref 3.5–5.1)
Sodium: 133 mmol/L — ABNORMAL LOW (ref 135–145)
TCO2: 25 mmol/L (ref 22–32)
pCO2 arterial: 47.4 mmHg (ref 32–48)
pH, Arterial: 7.293 — ABNORMAL LOW (ref 7.35–7.45)
pO2, Arterial: 134 mmHg — ABNORMAL HIGH (ref 83–108)

## 2022-06-06 LAB — CBC
HCT: 32.1 % — ABNORMAL LOW (ref 39.0–52.0)
Hemoglobin: 11.2 g/dL — ABNORMAL LOW (ref 13.0–17.0)
MCH: 32.9 pg (ref 26.0–34.0)
MCHC: 34.9 g/dL (ref 30.0–36.0)
MCV: 94.4 fL (ref 80.0–100.0)
Platelets: 47 10*3/uL — ABNORMAL LOW (ref 150–400)
RBC: 3.4 MIL/uL — ABNORMAL LOW (ref 4.22–5.81)
RDW: 19.4 % — ABNORMAL HIGH (ref 11.5–15.5)
WBC: 14.1 10*3/uL — ABNORMAL HIGH (ref 4.0–10.5)
nRBC: 0 % (ref 0.0–0.2)

## 2022-06-06 LAB — GLUCOSE, CAPILLARY
Glucose-Capillary: 266 mg/dL — ABNORMAL HIGH (ref 70–99)
Glucose-Capillary: 212 mg/dL — ABNORMAL HIGH (ref 70–99)
Glucose-Capillary: 201 mg/dL — ABNORMAL HIGH (ref 70–99)
Glucose-Capillary: 176 mg/dL — ABNORMAL HIGH (ref 70–99)
Glucose-Capillary: 168 mg/dL — ABNORMAL HIGH (ref 70–99)
Glucose-Capillary: 214 mg/dL — ABNORMAL HIGH (ref 70–99)
Glucose-Capillary: 159 mg/dL — ABNORMAL HIGH (ref 70–99)
Glucose-Capillary: 166 mg/dL — ABNORMAL HIGH (ref 70–99)
Glucose-Capillary: 174 mg/dL — ABNORMAL HIGH (ref 70–99)

## 2022-06-06 LAB — RENAL FUNCTION PANEL
Albumin: 2.7 g/dL — ABNORMAL LOW (ref 3.5–5.0)
Anion gap: 7 (ref 5–15)
BUN: 138 mg/dL — ABNORMAL HIGH (ref 8–23)
CO2: 23 mmol/L (ref 22–32)
Calcium: 8.5 mg/dL — ABNORMAL LOW (ref 8.9–10.3)
Chloride: 102 mmol/L (ref 98–111)
Creatinine, Ser: 2.12 mg/dL — ABNORMAL HIGH (ref 0.61–1.24)
GFR, Estimated: 35 mL/min — ABNORMAL LOW (ref >60)
Glucose, Bld: 305 mg/dL — ABNORMAL HIGH (ref 70–99)
Phosphorus: 4.8 mg/dL — ABNORMAL HIGH (ref 2.5–4.6)
Potassium: 6.1 mmol/L — ABNORMAL HIGH (ref 3.5–5.1)
Sodium: 132 mmol/L — ABNORMAL LOW (ref 135–145)

## 2022-06-06 LAB — AMMONIA: Ammonia: 83 umol/L — ABNORMAL HIGH (ref 9–35)

## 2022-06-06 LAB — URINALYSIS, ROUTINE W REFLEX MICROSCOPIC
Bilirubin Urine: NEGATIVE
Glucose, UA: NEGATIVE mg/dL
Ketones, ur: NEGATIVE mg/dL
Leukocytes,Ua: NEGATIVE
Nitrite: NEGATIVE
Protein, ur: NEGATIVE mg/dL
Specific Gravity, Urine: 1.014 (ref 1.005–1.030)
pH: 5 (ref 5.0–8.0)

## 2022-06-06 LAB — BASIC METABOLIC PANEL
Anion gap: 11 (ref 5–15)
BUN: 147 mg/dL — ABNORMAL HIGH (ref 8–23)
CO2: 22 mmol/L (ref 22–32)
Calcium: 8.7 mg/dL — ABNORMAL LOW (ref 8.9–10.3)
Chloride: 100 mmol/L (ref 98–111)
Creatinine, Ser: 2.04 mg/dL — ABNORMAL HIGH (ref 0.61–1.24)
GFR, Estimated: 36 mL/min — ABNORMAL LOW (ref >60)
Glucose, Bld: 164 mg/dL — ABNORMAL HIGH (ref 70–99)
Potassium: 6.1 mmol/L — ABNORMAL HIGH (ref 3.5–5.1)
Sodium: 133 mmol/L — ABNORMAL LOW (ref 135–145)

## 2022-06-06 LAB — PROTIME-INR
INR: 1.5 — ABNORMAL HIGH (ref 0.8–1.2)
Prothrombin Time: 18.1 seconds — ABNORMAL HIGH (ref 11.4–15.2)

## 2022-06-06 LAB — LACTIC ACID, PLASMA: Lactic Acid, Venous: 1.6 mmol/L (ref 0.5–1.9)

## 2022-06-06 LAB — NA AND K (SODIUM & POTASSIUM), RAND UR
Potassium Urine: 31 mmol/L
Sodium, Ur: <10 mmol/L

## 2022-06-06 LAB — POTASSIUM
Potassium: 5.8 mmol/L — ABNORMAL HIGH (ref 3.5–5.1)
Potassium: 6.2 mmol/L — ABNORMAL HIGH (ref 3.5–5.1)
Potassium: 6.1 mmol/L — ABNORMAL HIGH (ref 3.5–5.1)
Potassium: 6.1 mmol/L — ABNORMAL HIGH (ref 3.5–5.1)
Potassium: 6.2 mmol/L — ABNORMAL HIGH (ref 3.5–5.1)

## 2022-06-06 LAB — CREATININE, URINE, RANDOM: Creatinine, Urine: 99 mg/dL

## 2022-06-06 MED ORDER — SODIUM ZIRCONIUM CYCLOSILICATE 10 G PO PACK
10.0000 g | PACK | Freq: Every day | ORAL | Status: AC
  Administered 2022-06-06: 10 g via ORAL
  Filled 2022-06-06: qty 1

## 2022-06-06 MED ORDER — DEXTROSE 50 % IV SOLN
1.0000 | Freq: Once | INTRAVENOUS | Status: AC
  Administered 2022-06-06: 50 mL via INTRAVENOUS
  Filled 2022-06-06: qty 50

## 2022-06-06 MED ORDER — ALBUTEROL SULFATE (2.5 MG/3ML) 0.083% IN NEBU
10.0000 mg | INHALATION_SOLUTION | Freq: Once | RESPIRATORY_TRACT | Status: AC
  Administered 2022-06-06: 10 mg via RESPIRATORY_TRACT
  Filled 2022-06-06: qty 12

## 2022-06-06 MED ORDER — INSULIN ASPART 100 UNIT/ML IV SOLN
5.0000 [IU] | Freq: Once | INTRAVENOUS | Status: AC
  Administered 2022-06-06: 5 [IU] via INTRAVENOUS

## 2022-06-06 MED ORDER — FUROSEMIDE 10 MG/ML IJ SOLN
40.0000 mg | Freq: Four times a day (QID) | INTRAMUSCULAR | Status: AC
  Administered 2022-06-06 (×3): 40 mg via INTRAVENOUS
  Filled 2022-06-06 (×2): qty 4

## 2022-06-06 MED ORDER — FUROSEMIDE 10 MG/ML IJ SOLN
40.0000 mg | Freq: Once | INTRAMUSCULAR | Status: DC
  Filled 2022-06-06: qty 4

## 2022-06-06 MED ORDER — LACTULOSE 10 GM/15ML PO SOLN
30.0000 g | Freq: Three times a day (TID) | ORAL | Status: DC
  Administered 2022-06-06 – 2022-06-07 (×4): 30 g
  Filled 2022-06-06 (×5): qty 45

## 2022-06-06 MED ORDER — INSULIN ASPART 100 UNIT/ML IV SOLN
10.0000 [IU] | Freq: Once | INTRAVENOUS | Status: AC
  Administered 2022-06-06: 10 [IU] via INTRAVENOUS

## 2022-06-06 MED ORDER — SORBITOL 70 % SOLN
960.0000 mL | TOPICAL_OIL | Freq: Once | ORAL | Status: AC
  Administered 2022-06-06: 960 mL via RECTAL
  Filled 2022-06-06: qty 240

## 2022-06-06 MED ORDER — FUROSEMIDE 10 MG/ML IJ SOLN
40.0000 mg | Freq: Three times a day (TID) | INTRAMUSCULAR | Status: AC
  Administered 2022-06-06 – 2022-06-07 (×2): 40 mg via INTRAVENOUS
  Filled 2022-06-06 (×2): qty 4

## 2022-06-06 MED ORDER — ALBUTEROL SULFATE (2.5 MG/3ML) 0.083% IN NEBU
INHALATION_SOLUTION | RESPIRATORY_TRACT | Status: AC
  Filled 2022-06-06: qty 3

## 2022-06-06 MED ORDER — SODIUM ZIRCONIUM CYCLOSILICATE 10 G PO PACK
10.0000 g | PACK | Freq: Every day | ORAL | Status: DC
  Administered 2022-06-06: 10 g
  Filled 2022-06-06: qty 1

## 2022-06-06 MED ORDER — MIDAZOLAM HCL 2 MG/2ML IJ SOLN
0.5000 mg | INTRAMUSCULAR | Status: DC | PRN
  Administered 2022-06-06: 2 mg via INTRAVENOUS
  Administered 2022-06-06: 1 mg via INTRAVENOUS
  Administered 2022-06-07 – 2022-06-08 (×4): 2 mg via INTRAVENOUS
  Filled 2022-06-06 (×6): qty 2

## 2022-06-06 MED ORDER — PREDNISONE 10 MG PO TABS
5.0000 mg | ORAL_TABLET | Freq: Every day | ORAL | Status: DC
  Administered 2022-06-06 – 2022-06-07 (×2): 5 mg
  Filled 2022-06-06 (×2): qty 1

## 2022-06-06 MED ORDER — LACTULOSE 10 GM/15ML PO SOLN
30.0000 g | Freq: Three times a day (TID) | ORAL | Status: DC | PRN
  Administered 2022-06-06: 30 g
  Filled 2022-06-06: qty 45

## 2022-06-06 NOTE — Progress Notes (Signed)
eLink Physician-Brief Progress Note Patient Name: GARDINER SYDOW DOB: 02-02-1961 MRN: [id number]   Date of Service  06/06/2022  HPI/Events of Note  Ammonia level 83.  eICU Interventions  Lactulose ordered.        Georgie Eduardo U Tayanna Talford 06/06/2022, 6:18 AM

## 2022-06-06 NOTE — Progress Notes (Signed)
eLink Physician-Brief Progress Note Patient Name: Andrew Fowler DOB: 1961-03-12 MRN: [id number]   Date of Service  06/06/2022  HPI/Events of Note  KUB reviewed, K+ 6.1  eICU Interventions  Reglan 10 mg iv x 1, Hyperkalemia treatment protocol ordered.        [name] [name] 06/06/2022, 4:17 AM

## 2022-06-06 NOTE — Progress Notes (Addendum)
NAME:  NAGEE PREY, MRN:  [id number], DOB:  [birth year], LOS: 6 ADMISSION DATE:  05/30/2022, CONSULTATION DATE:  06/02/2022 REFERRING MD:  Dr Markus Jarvis, CHIEF COMPLAINT:   Acute hypoxic respiratory failure  History of Present Illness:  Patient is a 62 year old male with pertinent PMH NASH cirrhosis, COPD, OSA, RA, T2DM, severe obesity presents to [facility] ED on 3/7 with SOB.   Patient states he has been having progressively worsening SOB over the past 10 days.  States he has noticed more wheezing and productive cough.  Denies fever/chills.  Also noticed more swelling in legs and body and admits to gaining weight.  States he has been taking his home medications faithfully.  On 3/7 came to [facility] ED for further eval.  Vital stable.  Sats 95% on room air.  Labs showing significant hyponatremia, AKI, elevated liver enzymes , low albumin, LA 4.2 then 2.9.  Chronic thrombocytopenia 104.  Patient was given IV Lasix and some breathing treatments which helped with breathing.  Started on steroids.  CXR showing perihilar opacities.  Started on rocephin/azithromycin. On 3/8 paracentesis was performed pulling 2.4L of clear yellow fluid.  Albumin given.  Spironolactone/Lasix held due to elevated creatinine.   On 3/9 patient with worsening respiratory distress at 2 AM.  Patient sats dropped to 76% on 2 LNC.  Patient was placed on NRB with improvement in sats.  CXR showing worsening infiltrate versus edema bilaterally.  Patient transferred to progressive and given Lasix.  Patient only put out 300 mL UOP.  ABG 7.31, 42, 111, 21.  Patient with sats only 88% on NRB and still in respiratory distress.  PCCM consulted.  Pertinent  Medical History   Past Medical History:  Diagnosis Date   Abnormal laboratory test result 07/04/2011   Immunofixation electrophoresis 3/13 showed slightly restricted mobility in the IgG and kappa lanes and suggested repeat end of year.    Anxiety associated with depression 04/19/2011   On chronic benzos  and SSRI's. Gets panic attacks. Does not see mental health.   Chronic pain syndrome 04/19/2011   Combination of RA, obesity, OA, & DDD. Has seen Dr Sharol Given & s/p R total hip arthroplasty 2/2 OA & ? AVN. s/p L4-L5 laminectomy. Lumbar MRI 4/11 : L3-L4 mod central canal narrowing.  Cervical MRI 6/11 : multi-level DDD and L foraminal stenosis at C4-5 and C5-6.    Chronic venous insufficiency 2013   ECHO 2013 was normal. LFT's, creatinine, TSH all normal. Alb a bit low.   COPD (chronic obstructive pulmonary disease) ([place])    Per pt, he has a diagnosis of COPD. No PFT's. Uses Alb MDI less than once a month.   Diabetes mellitus    Non-insulin dependent Type II.   Edema 04/19/2011   ECHO 2013 was normal. LFT's, creatinine, TSH all normal. Alb a bit low. Likely 2/2 chronic venous insuff 2/2 weight.   Elevated liver function tests 05/17/2011   04/2010. Increased GGT (pt uses ETOH). AMA negative. ABD U/S limited but otherwise negative. Follow Alk phos level. AST & ALT also elevated. False + IgM Hep B Core Ab. Hep B viral load negative.   Same pattern during hospitalization 2010 : highest alkaline phosphatase was 355, AST 259, and ALT 871. ANA, rheumatoid factor, ceruloplasmin, CMV IgM, alpha antitrypsin, and AMA, all within normal limits.   Encounter for power mobility device assessment 03/14/2020   Erythrocytosis 07/04/2016   Gout    Pt has never had a crystal diagnosis.   Hepatitis B antibody positive  2013   IgM Hep B core Ab + but Hepatitis B viral load negative.    Hyperlipidemia    At goal of LDL <100 with statin.   Hypertension    ACEI monotherapy   Leukocytosis 01/11/2014   Since 2010. Smear 2015 mild left shift. Is on prednisone for RA but increase started before leukocytosis.    NASH (nonalcoholic steatohepatitis), ? some EtOH contribution and fibrosis  05/17/2011   Elevated since 05/2008 during hospitalization. Relatively stable. Extensive W/U and no etiology but likely fatty liver 2/2 obesity despite  normal imaging.  Hepatitis - all negative. There was an initial + IgM Hep B core Ab but repeat testing was negative and Hep B viral load was negative.  TTG 06/15/2013  ANA x 2 (05/07/11 and 06/13/2008) ASMA x2 (07/18/2011 negative & weakly + at 21 on 05/27/2011 & nega   OA (osteoarthritis)    Obesity, Class III, BMI 40-49.9 (morbid obesity) ([place])    Obstructive sleep apnea 08/21/08   Sleep study AHI 16.4 with desat to 66%. CPAP of 17 decreased AHI to 0.9. Non-compliant with CPAP.   RA (rheumatoid arthritis) ([place]) 2013   Shortness of breath    Tobacco abuse    Per pt, he has a diagnosis of COPD. Need to locate PFT's.   Wheelchair bound    Significant Hospital Events: Including procedures, antibiotic start and stop dates in addition to other pertinent events   3/7 admitted to [facility] for shortness of breath, admitted to floors 3/9 worsening respiratory distress with hypoxia, transferred to ICU, started on BiPAP.  Respiratory worsening,-intubated.  Requiring pressors  Interim History / Subjective:  Overnight increased sedation but continued to have dyssynchrony with the vent. Also had 1.2L residual tube feeds in abdomen, TF were stopped overnight.   Objective   Blood pressure (!) 109/48, pulse 79, temperature 97.9 F (36.6 C), temperature source Axillary, resp. rate 20, height '5\' 11"'$  (1.803 m), weight (!) 151.1 kg, SpO2 97 %. CVP:  [11 mmHg-14 mmHg] 12 mmHg  Vent Mode: PRVC FiO2 (%):  [40 %] 40 % Set Rate:  [20 bmp] 20 bmp Vt Set:  [600 mL] 600 mL PEEP:  [8 cmH20] 8 cmH20 Pressure Support:  [10 cmH20] 10 cmH20 Plateau Pressure:  [24 cmH20-26 cmH20] 26 cmH20   Intake/Output Summary (Last 24 hours) at 06/06/2022 0827 Last data filed at 06/06/2022 0631 Gross per 24 hour  Intake 1804.07 ml  Output 2405 ml  Net -600.93 ml    Filed Weights   06/04/22 0500 06/05/22 0335 06/06/22 0353  Weight: (!) 149.9 kg (!) 153.5 kg (!) 151.1 kg   Examination: General: Chronically ill-appearing, laying in bed  in no acute distress HENT: Endotracheal tube in place, moist oral mucosa, Lungs:Normal work of breathing. Rhonchi bilaterally, mild wheezing appreciated. Cardiovascular: Regular rate, rhythm. No murmurs. Warm extremities.  Abdomen: Soft, normoactive bowel sounds.  Extremities: 2-3+ pitting edema bilaterally to hips Neuro: Somnolent, not arousable, not following directions  Resolved Hospital Problem list    Assessment & Plan:  #Acute hypoxemic, hypercapnic respiratory failure #Pulmonary edema, COPD exacerbation, possible community-acquired pneumonia Overnight had worsening dyssynchrony with the ventilation with more sedation. Repeat chest XR this AM with continued bilateral infiltrates, most likely due to pulmonary edema. WBC has slightly increased but is on IV steroids. Today will push diuresis further given stable renal function. Will try and wean off sedation and see if he is able to be extubated later today.  -Continue full ventilatory support -Diurese as below -  Continue DuoNebs every 4 hours -Return to home prednisone '5mg'$  per tube -Target PaO2 of 55-65 -VAP protocol in place   #Decompensated cirrhosis secondary to NASH #Acute kidney injury Urine output yesterday okay, 0.4cc/kg/hr with two doses of IV furosemide. Patient still having significant pulmonary edema and pressures are stable. Given renal function is stable today will plan to increase diuresis.  -Increase IV furosemide '40mg'$  x3 today -Continue midodrine '10mg'$  q8h -Strict I/O  #Hyperkalemia K 6.1 this AM, received additional insulin, one dose of Lokelma. Repeat 5.8 and received additional Lokelma. Will repeat this later this morning. Increase in lasix should also aid in this. - S/p '10mg'$  Lokelma x2 - Increase in furosemide - Follow-up repeat K  #Agitation Overnight patient had some issues with dyssynchrony. Ammonia level 83 this AM and lactulose was initiated. Could have component of hepatic encephalopathy but difficult to  discern just based on ammonia level.  - Seroquel '50mg'$  twice daily - Continue oxycodone '5mg'$  every 8 hours - Lactulose 30g three times daily - Continue fentanyl, Precedex for agitation  - Fentanyl, versed PRN  #Type II diabetes mellitus Sugars remain in 200's this AM. However given tube feeds have been stopped and we are stopping high-dose IV steroids keep insulin regimen the same today.  - Continue semglee 35 units - Continue Novolog 10u + SSI q4h  #Constipation Patient has not had bowel movement in seven days now. Tube feed contents were suctioned out overnight, roughly 1.2L. KUB with gaseous distention in large bowel, concerning for possible ileus. - Bowel rest - Hold laxatives  #Rheumatoid arthritis -Re-start home prednisone '5mg'$  daily  #Chronic thrombocytopenia #Anemia Platelets 47 this morning, plan to hold prophylactic heparin today. - Daily CBC  #Tobacco use disorder -Continue nicotine patch  Best Practice (right click and "Reselect all SmartList Selections" daily)   Diet/type: tubefeeds DVT prophylaxis: prophylactic heparin  - holding today in setting of thrombocytopenia <50 GI prophylaxis: PPI Lines: Central line Foley:  Yes, and it is still needed Code Status:  full code Last date of multidisciplinary goals of care discussion: Discussion with brother and sister yesterday 3/12  Labs   CBC: Recent Labs  Lab 05/30/22 2150 05/31/22 0444 06/01/22 0610 06/01/22 0731 06/02/22 0432 06/03/22 0541 06/04/22 0429 06/05/22 0332 06/06/22 0203  WBC 12.5*   < > 18.4*  --  14.3* 12.5* 12.1* 13.6* 14.1*  NEUTROABS 10.4*  --  15.3*  --   --   --   --   --   --   HGB 12.4*   < > 12.6*   < > 11.8* 11.4* 11.7* 11.4* 11.2*  HCT 35.3*   < > 34.2*   < > 32.5* 32.8* 32.0* 31.6* 32.1*  MCV 93.1   < > 89.8  --  90.3 92.1 90.7 90.5 94.4  PLT 104*   < > 100*  --  64* 70* 56* 53* 47*   < > = values in this interval not displayed.     Basic Metabolic Panel: Recent Labs  Lab  06/01/22 0937 06/01/22 1104 06/01/22 1732 06/02/22 0322 06/02/22 0432 06/03/22 0540 06/03/22 0541 06/04/22 0429 06/05/22 0332 06/05/22 1638 06/06/22 0203 06/06/22 0515  NA  --    < >  --    < > 126*  --  129* 129* 131* 132* 132*  --   K  --    < >  --    < > 4.7  --  4.8 5.1 5.3* 5.9* 6.1* 5.8*  CL  --   --   --   --  92*  --  95* 99 101 101 102  --   CO2  --   --   --   --  19*  --  20* 21* 21* 22 23  --   GLUCOSE  --   --   --   --  258*  --  251* 237* 301* 296* 305*  --   BUN  --   --   --   --  63*  --  77* 100* 120* 130* 138*  --   CREATININE  --   --   --   --  2.15*  --  1.97* 2.08* 2.08* 2.03* 2.12*  --   CALCIUM  --   --   --   --  9.1  --  9.0 8.7* 8.7* 8.6* 8.5*  --   MG 2.3  --  2.4  --  2.6* 2.7*  --  2.6*  --   --   --   --   PHOS 5.6*  --  6.2*  --  5.2* 4.1  --  3.8 3.6 4.5 4.8*  --    < > = values in this interval not displayed.    GFR: Estimated Creatinine Clearance: 54.7 mL/min (A) (by C-G formula based on SCr of 2.12 mg/dL (H)). Recent Labs  Lab 05/30/22 2209 05/30/22 2328 05/31/22 0444 05/31/22 0943 06/01/22 0610 06/03/22 0541 06/04/22 0429 06/05/22 0332 06/06/22 0203  WBC  --   --  9.7  --    < > 12.5* 12.1* 13.6* 14.1*  LATICACIDVEN 4.2* 2.9* 2.0* 1.8  --   --   --   --   --    < > = values in this interval not displayed.     Liver Function Tests: Recent Labs  Lab 05/30/22 2150 05/31/22 0444 06/01/22 0610 06/05/22 0332 06/05/22 1638 06/06/22 0203  AST 99* 85* 75*  --   --   --   ALT 72* 73* 61*  --   --   --   ALKPHOS 449* 456* 406*  --   --   --   BILITOT 5.6* 5.4* 5.4*  --   --   --   PROT 5.5* 6.1* 6.7  --   --   --   ALBUMIN 1.7* 1.8* 3.2* 2.9* 2.8* 2.7*    Recent Labs  Lab 05/30/22 2150  LIPASE 58*    Recent Labs  Lab 05/30/22 2328 06/02/22 0806 06/06/22 0449  AMMONIA 99* 39* 83*     ABG    Component Value Date/Time   PHART 7.341 (L) 06/02/2022 0322   PCO2ART 39.4 06/02/2022 0322   PO2ART 180 (H) 06/02/2022  0322   HCO3 21.4 06/02/2022 0322   TCO2 23 06/02/2022 0322   ACIDBASEDEF 4.0 (H) 06/02/2022 0322   O2SAT 100 06/02/2022 0322     Coagulation Profile: Recent Labs  Lab 05/30/22 2328 05/31/22 0444 06/05/22 0332 06/06/22 0203  INR 1.2 1.3* 1.5* 1.5*     Cardiac Enzymes: No results for input(s): "CKTOTAL", "CKMB", "CKMBINDEX", "TROPONINI" in the last 168 hours.  HbA1C: Hemoglobin A1C  Date/Time Value Ref Range Status  03/05/2022 10:13 AM 6.1 (A) 4.0 - 5.6 % Final  07/09/2021 12:13 PM 5.4 4.0 - 5.6 % Final  09/07/2018 12:00 AM 6.5  Final   Hgb A1c MFr Bld  Date/Time Value Ref Range Status  05/16/2012 04:48 AM 8.7 (H) <5.7 % Final    Comment:    (NOTE)  According to the ADA Clinical Practice Recommendations for 2011, when HbA1c is used as a screening test:  >=6.5%   Diagnostic of Diabetes Mellitus           (if abnormal result is confirmed) 5.7-6.4%   Increased risk of developing Diabetes Mellitus References:Diagnosis and Classification of Diabetes Mellitus,Diabetes Care,2011,34(Suppl 1):S62-S69 and Standards of Medical Care in         Diabetes - 2011,Diabetes [id number] (Suppl 1):S11-S61.  05/06/2011 09:15 PM 6.1 (H) <5.7 % Final    Comment:    (NOTE)                                                                       According to the ADA Clinical Practice Recommendations for 2011, when HbA1c is used as a screening test:  >=6.5%   Diagnostic of Diabetes Mellitus           (if abnormal result is confirmed) 5.7-6.4%   Increased risk of developing Diabetes Mellitus References:Diagnosis and Classification of Diabetes Mellitus,Diabetes [id number] 1):S62-S69 and Standards of Medical Care in         Diabetes - 2011,Diabetes Care,2011,34 (Suppl 1):S11-S61.    CBG: Recent Labs  Lab 06/05/22 1911 06/05/22 2305 06/06/22 0313 06/06/22 0546 06/06/22 0651  GLUCAP 283* 309* 266* 212* 201*      Review of Systems:   Unable to provide history  Past Medical History:  He,  has a past medical history of Abnormal laboratory test result (07/04/2011), Anxiety associated with depression (04/19/2011), Chronic pain syndrome (04/19/2011), Chronic venous insufficiency (2013), COPD (chronic obstructive pulmonary disease) ([place]), Diabetes mellitus, Edema (04/19/2011), Elevated liver function tests (05/17/2011), Encounter for power mobility device assessment (03/14/2020), Erythrocytosis (07/04/2016), Gout, Hepatitis B antibody positive (2013), Hyperlipidemia, Hypertension, Leukocytosis (01/11/2014), NASH (nonalcoholic steatohepatitis), ? some EtOH contribution and fibrosis  (05/17/2011), OA (osteoarthritis), Obesity, Class III, BMI 40-49.9 (morbid obesity) ([place]), Obstructive sleep apnea (08/21/08), RA (rheumatoid arthritis) ([place]) (2013), Shortness of breath, Tobacco abuse, and Wheelchair bound.   Surgical History:   Past Surgical History:  Procedure Laterality Date   ESOPHAGOGASTRODUODENOSCOPY (EGD) WITH PROPOFOL N/A 05/08/2022   Procedure: ESOPHAGOGASTRODUODENOSCOPY (EGD) WITH PROPOFOL;  Surgeon: Daryel November, MD;  Location: [facility] ENDOSCOPY;  Service: Gastroenterology;  Laterality: N/A;   IR PARACENTESIS  05/09/2022   IR PARACENTESIS  05/31/2022   LAMINECTOMY  1995   L4-L5   TONSILLECTOMY     TOTAL HIP ARTHROPLASTY Left 2009     Social History:   reports that he has been smoking cigarettes. He has a 3.00 pack-year smoking history. He has never used smokeless tobacco. He reports that he does not drink alcohol and does not use drugs.   Family History:  His family history includes Diabetes in his father and mother; Heart attack in his mother; Heart disease in his mother; Kidney disease in his father; Obesity in his brother.   Allergies Not on File   Sanjuan Dame, MD Internal Medicine PGY-3 Pager: [phone]

## 2022-06-06 NOTE — Progress Notes (Addendum)
eLink Physician-Brief Progress Note Patient Name: Andrew Fowler DOB: December 05, 1960 MRN: [id number]   Date of Service  06/06/2022  HPI/Events of Note  62 year old male who presented with decompensated cirrhosis complicated by acute kidney injury and respiratory failure.  potassium 6.1, pt has OG tube  eICU Interventions  Status post multiple doses of Lokelma, additional Lokelma.  Additional furosemide.    0245 - repeat k 6.1; Hyperk protocol again. Add bicarb infusion for 1L   Intervention Category Minor Interventions: Electrolytes abnormality - evaluation and management  Jaece Ducharme 06/06/2022, 7:59 PM

## 2022-06-06 NOTE — Progress Notes (Signed)
eLink Physician-Brief Progress Note Patient Name: Andrew Fowler DOB: 10-18-1960 MRN: [id number]   Date of Service  06/06/2022  HPI/Events of Note  Patient with ventilator dyssynchrony after receiving a bath, he also had 1200 cc of tube feed aspirated from his stomach by the bedside RN.  eICU Interventions  PRN Versed  1-2 mg Q 1 hour added for optimal sedation. KUB ordered to r/o ileus.        [name] Malerie Eakins 06/06/2022, 3:14 AM

## 2022-06-07 ENCOUNTER — Inpatient Hospital Stay (HOSPITAL_COMMUNITY): Payer: Medicaid Other

## 2022-06-07 DIAGNOSIS — K7290 Hepatic failure, unspecified without coma: Secondary | ICD-10-CM | POA: Diagnosis not present

## 2022-06-07 DIAGNOSIS — K7460 Unspecified cirrhosis of liver: Secondary | ICD-10-CM | POA: Diagnosis not present

## 2022-06-07 LAB — RENAL FUNCTION PANEL
Albumin: 2.7 g/dL — ABNORMAL LOW (ref 3.5–5.0)
Anion gap: 9 (ref 5–15)
BUN: 155 mg/dL — ABNORMAL HIGH (ref 8–23)
CO2: 21 mmol/L — ABNORMAL LOW (ref 22–32)
Calcium: 8.6 mg/dL — ABNORMAL LOW (ref 8.9–10.3)
Chloride: 103 mmol/L (ref 98–111)
Creatinine, Ser: 2.12 mg/dL — ABNORMAL HIGH (ref 0.61–1.24)
GFR, Estimated: 35 mL/min — ABNORMAL LOW (ref >60)
Glucose, Bld: 220 mg/dL — ABNORMAL HIGH (ref 70–99)
Phosphorus: 4.9 mg/dL — ABNORMAL HIGH (ref 2.5–4.6)
Potassium: 5.8 mmol/L — ABNORMAL HIGH (ref 3.5–5.1)
Sodium: 133 mmol/L — ABNORMAL LOW (ref 135–145)

## 2022-06-07 LAB — CBC
HCT: 32.4 % — ABNORMAL LOW (ref 39.0–52.0)
Hemoglobin: 10.9 g/dL — ABNORMAL LOW (ref 13.0–17.0)
MCH: 32.2 pg (ref 26.0–34.0)
MCHC: 33.6 g/dL (ref 30.0–36.0)
MCV: 95.6 fL (ref 80.0–100.0)
Platelets: 45 10*3/uL — ABNORMAL LOW (ref 150–400)
RBC: 3.39 MIL/uL — ABNORMAL LOW (ref 4.22–5.81)
RDW: 19.5 % — ABNORMAL HIGH (ref 11.5–15.5)
WBC: 17.5 10*3/uL — ABNORMAL HIGH (ref 4.0–10.5)
nRBC: 0 % (ref 0.0–0.2)

## 2022-06-07 LAB — PROTIME-INR
INR: 1.5 — ABNORMAL HIGH (ref 0.8–1.2)
Prothrombin Time: 17.7 seconds — ABNORMAL HIGH (ref 11.4–15.2)

## 2022-06-07 LAB — LACTIC ACID, PLASMA
Lactic Acid, Venous: 2.1 mmol/L (ref 0.5–1.9)
Lactic Acid, Venous: 2 mmol/L (ref 0.5–1.9)

## 2022-06-07 LAB — HEPATIC FUNCTION PANEL
ALT: 64 U/L — ABNORMAL HIGH (ref 0–44)
AST: 77 U/L — ABNORMAL HIGH (ref 15–41)
Albumin: 2.8 g/dL — ABNORMAL LOW (ref 3.5–5.0)
Alkaline Phosphatase: 190 U/L — ABNORMAL HIGH (ref 38–126)
Bilirubin, Direct: 2.5 mg/dL — ABNORMAL HIGH (ref 0.0–0.2)
Indirect Bilirubin: 2.5 mg/dL — ABNORMAL HIGH (ref 0.3–0.9)
Total Bilirubin: 5 mg/dL — ABNORMAL HIGH (ref 0.3–1.2)
Total Protein: 5.7 g/dL — ABNORMAL LOW (ref 6.5–8.1)

## 2022-06-07 LAB — POTASSIUM
Potassium: 6.1 mmol/L — ABNORMAL HIGH (ref 3.5–5.1)
Potassium: 6 mmol/L — ABNORMAL HIGH (ref 3.5–5.1)
Potassium: 6.1 mmol/L — ABNORMAL HIGH (ref 3.5–5.1)
Potassium: 6.1 mmol/L — ABNORMAL HIGH (ref 3.5–5.1)
Potassium: 6.1 mmol/L — ABNORMAL HIGH (ref 3.5–5.1)

## 2022-06-07 LAB — GLUCOSE, CAPILLARY
Glucose-Capillary: 159 mg/dL — ABNORMAL HIGH (ref 70–99)
Glucose-Capillary: 167 mg/dL — ABNORMAL HIGH (ref 70–99)
Glucose-Capillary: 182 mg/dL — ABNORMAL HIGH (ref 70–99)
Glucose-Capillary: 176 mg/dL — ABNORMAL HIGH (ref 70–99)
Glucose-Capillary: 162 mg/dL — ABNORMAL HIGH (ref 70–99)

## 2022-06-07 LAB — AMMONIA: Ammonia: 98 umol/L — ABNORMAL HIGH (ref 9–35)

## 2022-06-07 MED ORDER — MIDODRINE HCL 5 MG PO TABS
15.0000 mg | ORAL_TABLET | Freq: Three times a day (TID) | ORAL | Status: DC
  Administered 2022-06-07 – 2022-06-08 (×2): 15 mg
  Filled 2022-06-07: qty 3

## 2022-06-07 MED ORDER — SODIUM BICARBONATE 8.4 % IV SOLN
INTRAVENOUS | Status: AC
  Filled 2022-06-07: qty 1000

## 2022-06-07 MED ORDER — ALBUTEROL SULFATE (2.5 MG/3ML) 0.083% IN NEBU
10.0000 mg | INHALATION_SOLUTION | Freq: Once | RESPIRATORY_TRACT | Status: AC
  Administered 2022-06-07: 10 mg via RESPIRATORY_TRACT
  Filled 2022-06-07: qty 12

## 2022-06-07 MED ORDER — SODIUM ZIRCONIUM CYCLOSILICATE 10 G PO PACK
10.0000 g | PACK | Freq: Two times a day (BID) | ORAL | Status: DC
  Administered 2022-06-07: 10 g via ORAL
  Filled 2022-06-07: qty 1

## 2022-06-07 MED ORDER — FUROSEMIDE 10 MG/ML IJ SOLN
40.0000 mg | Freq: Four times a day (QID) | INTRAMUSCULAR | Status: DC
  Administered 2022-06-07: 40 mg via INTRAVENOUS
  Filled 2022-06-07: qty 4

## 2022-06-07 MED ORDER — FUROSEMIDE 10 MG/ML IJ SOLN
120.0000 mg | Freq: Four times a day (QID) | INTRAVENOUS | Status: DC
  Administered 2022-06-07 – 2022-06-08 (×3): 120 mg via INTRAVENOUS
  Filled 2022-06-07: qty 10
  Filled 2022-06-07: qty 2
  Filled 2022-06-07: qty 10
  Filled 2022-06-07 (×3): qty 12

## 2022-06-07 MED ORDER — LACTULOSE 10 GM/15ML PO SOLN
30.0000 g | Freq: Four times a day (QID) | ORAL | Status: DC
  Administered 2022-06-07 (×2): 30 g
  Filled 2022-06-07: qty 45

## 2022-06-07 MED ORDER — NOREPINEPHRINE 16 MG/250ML-% IV SOLN
0.0000 ug/min | INTRAVENOUS | Status: DC
  Administered 2022-06-08: 28 ug/min via INTRAVENOUS
  Administered 2022-06-08: 14 ug/min via INTRAVENOUS
  Filled 2022-06-07 (×2): qty 250

## 2022-06-07 MED ORDER — OCTREOTIDE ACETATE 100 MCG/ML IJ SOLN
200.0000 ug | Freq: Three times a day (TID) | INTRAMUSCULAR | Status: DC
  Administered 2022-06-07 (×2): 200 ug via SUBCUTANEOUS
  Filled 2022-06-07 (×4): qty 2

## 2022-06-07 MED ORDER — DEXTROSE 50 % IV SOLN
1.0000 | Freq: Once | INTRAVENOUS | Status: AC
  Administered 2022-06-07: 50 mL via INTRAVENOUS
  Filled 2022-06-07: qty 50

## 2022-06-07 MED ORDER — ALBUMIN HUMAN 25 % IV SOLN
25.0000 g | Freq: Four times a day (QID) | INTRAVENOUS | Status: AC
  Administered 2022-06-07 (×2): 25 g via INTRAVENOUS
  Filled 2022-06-07 (×2): qty 100

## 2022-06-07 MED ORDER — SENNOSIDES 8.8 MG/5ML PO SYRP
10.0000 mL | ORAL_SOLUTION | Freq: Two times a day (BID) | ORAL | Status: DC
  Administered 2022-06-07 (×2): 10 mL
  Filled 2022-06-07 (×2): qty 10

## 2022-06-07 MED ORDER — INSULIN ASPART 100 UNIT/ML IV SOLN
10.0000 [IU] | Freq: Once | INTRAVENOUS | Status: AC
  Administered 2022-06-07: 10 [IU] via INTRAVENOUS

## 2022-06-07 MED ORDER — SODIUM ZIRCONIUM CYCLOSILICATE 10 G PO PACK
10.0000 g | PACK | Freq: Two times a day (BID) | ORAL | Status: AC
  Administered 2022-06-07 (×2): 10 g
  Filled 2022-06-07 (×2): qty 1

## 2022-06-07 NOTE — Progress Notes (Signed)
Updated Pts brother [name] explained that despite everything the physicians are doing Pt is not getting better. Pts brother notified sisters Sharee Pimple and Seth Bake, and they are coming up from [place].   Palliative care team has been consulted but hasn't seen Pt or met with patient at the time of this note.  Family wishes to meet with MD 0900 June 28, 2022

## 2022-06-07 NOTE — Consult Note (Addendum)
Reason for Consult:AKI Referring Physician: Vaughan Browner, MD  Andrew Fowler is an 62 y.o. male with a PMH significant for NASH cirrhosis, chronic thrombocytopenia, COPD, OSA, morbid obesity, DM type 2, rheumatoid arthritis, and adrenal insufficiency who presented to [facility] ED on 05/29/2022 with worsening lower extremity edema and SOB.  In the ED, Bp 103/33, pulse 109, RR 18, SpO2 95%, labs notable for Na 124, Cl 94, Co2 20, BUN 41, Cr 2.24, AST 99, ALT 72, Alk phos 449, T bili 5.6, direct 2.6, indirect bili 3, WBC 12.5, Hgb 12.4, plt 104.  He was admitted for decompensated cirrhosis and AKI.  He underwent IV lasix and breathing treatments.  He underwent paracentesis on 05/31/22 with 2.4 L of clear yellow fluid.  Spironolactone and lasix held due to rising Scr.  He developed respiratory distress on 06/01/22 and transferred to ICU and intubated due to worsening respiratory status.  We were consulted due to decreased UOP, hyperkalemia, and inability to diurese with AKI, concerning for HRS.  The trend in Scr is seen below.    He has been on midodrine and IV albumin.  UNa <10, [place] 31.  Trend in Creatinine: Creatinine, Ser  Date/Time Value Ref Range Status  06/07/2022 04:27 AM 2.12 (H) 0.61 - 1.24 mg/dL Final  06/06/2022 12:58 PM 2.04 (H) 0.61 - 1.24 mg/dL Final  06/06/2022 02:03 AM 2.12 (H) 0.61 - 1.24 mg/dL Final  06/05/2022 04:38 PM 2.03 (H) 0.61 - 1.24 mg/dL Final  06/05/2022 03:32 AM 2.08 (H) 0.61 - 1.24 mg/dL Final  06/04/2022 04:29 AM 2.08 (H) 0.61 - 1.24 mg/dL Final  06/03/2022 05:41 AM 1.97 (H) 0.61 - 1.24 mg/dL Final  06/02/2022 04:32 AM 2.15 (H) 0.61 - 1.24 mg/dL Final  06/01/2022 06:10 AM 2.32 (H) 0.61 - 1.24 mg/dL Final  05/31/2022 04:44 AM 2.16 (H) 0.61 - 1.24 mg/dL Final  06/07/2022 09:50 PM 2.24 (H) 0.61 - 1.24 mg/dL Final  05/23/2022 11:52 AM 1.05 0.76 - 1.27 mg/dL Final  05/10/2022 10:40 AM 1.00 0.61 - 1.24 mg/dL Final  05/09/2022 07:08 AM 1.05 0.61 - 1.24 mg/dL Final  05/08/2022 06:17 AM 1.00  0.61 - 1.24 mg/dL Final  05/07/2022 04:16 AM 1.05 0.61 - 1.24 mg/dL Final  05/06/2022 06:43 AM 1.00 0.61 - 1.24 mg/dL Final  05/05/2022 12:51 AM 1.01 0.61 - 1.24 mg/dL Final  05/04/2022 12:37 AM 1.19 0.61 - 1.24 mg/dL Final  05/03/2022 05:39 AM 1.02 0.61 - 1.24 mg/dL Final  05/02/2022 01:15 PM 1.05 0.61 - 1.24 mg/dL Final  03/05/2022 11:27 AM 0.75 (L) 0.76 - 1.27 mg/dL Final  07/09/2021 11:44 AM 0.90 0.76 - 1.27 mg/dL Final  07/25/2020 11:39 AM 0.75 (L) 0.76 - 1.27 mg/dL Final  12/21/2019 09:43 AM 0.78 0.76 - 1.27 mg/dL Final  04/07/2019 11:13 AM 0.66 0.61 - 1.24 mg/dL Final  04/19/2016 10:58 AM 1.08 0.76 - 1.27 mg/dL Final  05/17/2012 05:33 AM 0.91 0.50 - 1.35 mg/dL Final  05/16/2012 04:48 AM 0.95 0.50 - 1.35 mg/dL Final  05/15/2012 03:04 AM 1.73 (H) 0.50 - 1.35 mg/dL Final  05/14/2012 10:05 PM 1.60 (H) 0.50 - 1.35 mg/dL Final  06/14/2011 02:25 AM 0.62 0.50 - 1.35 mg/dL Final  05/07/2011 05:32 AM 0.65 0.50 - 1.35 mg/dL Final  05/06/2011 02:53 PM 0.77 0.50 - 1.35 mg/dL Final  03/29/2011 05:01 PM 0.70 0.50 - 1.35 mg/dL Final  12/26/2010 12:53 AM 0.72 0.50 - 1.35 mg/dL Final  12/19/2010 02:39 AM 0.74 0.50 - 1.35 mg/dL Final  06/26/2010 05:40 AM 0.84 0.4 -  1.5 mg/dL Final  06/25/2010 05:41 AM 0.78 0.4 - 1.5 mg/dL Final  06/24/2010 09:33 PM 0.98 0.4 - 1.5 mg/dL Final  01/31/2010 08:49 PM 0.79 (0.40-1.50 mg/dL Final  12/25/2009 10:54 AM 0.65 0.4 - 1.5 mg/dL Final  08/01/2008 09:54 PM 0.87 0.40 - 1.50 mg/dL Final  06/22/2008 04:14 AM 1.02 0.4 - 1.5 mg/dL Final  06/21/2008 06:15 AM 1.02 0.4 - 1.5 mg/dL Final  06/20/2008 03:15 AM 0.98 0.4 - 1.5 mg/dL Final  06/19/2008 04:30 AM 0.94 0.4 - 1.5 mg/dL Final  06/18/2008 03:55 AM 0.97 0.4 - 1.5 mg/dL Final  06/17/2008 05:34 AM 0.81 0.4 - 1.5 mg/dL Final  06/16/2008 05:50 AM 0.82 0.4 - 1.5 mg/dL Final  06/15/2008 06:15 AM 0.79 0.4 - 1.5 mg/dL Final  06/13/2008 03:55 AM 0.78 0.4 - 1.5 mg/dL Final    PMH:   Past Medical History:  Diagnosis  Date   Abnormal laboratory test result 07/04/2011   Immunofixation electrophoresis 3/13 showed slightly restricted mobility in the IgG and kappa lanes and suggested repeat end of year.    Anxiety associated with depression 04/19/2011   On chronic benzos and SSRI's. Gets panic attacks. Does not see mental health.   Chronic pain syndrome 04/19/2011   Combination of RA, obesity, OA, & DDD. Has seen Dr Sharol Given & s/p R total hip arthroplasty 2/2 OA & ? AVN. s/p L4-L5 laminectomy. Lumbar MRI 4/11 : L3-L4 mod central canal narrowing.  Cervical MRI 6/11 : multi-level DDD and L foraminal stenosis at C4-5 and C5-6.    Chronic venous insufficiency 2013   ECHO 2013 was normal. LFT's, creatinine, TSH all normal. Alb a bit low.   COPD (chronic obstructive pulmonary disease) ([place])    Per pt, he has a diagnosis of COPD. No PFT's. Uses Alb MDI less than once a month.   Diabetes mellitus    Non-insulin dependent Type II.   Edema 04/19/2011   ECHO 2013 was normal. LFT's, creatinine, TSH all normal. Alb a bit low. Likely 2/2 chronic venous insuff 2/2 weight.   Elevated liver function tests 05/17/2011   04/2010. Increased GGT (pt uses ETOH). AMA negative. ABD U/S limited but otherwise negative. Follow Alk phos level. AST & ALT also elevated. False + IgM Hep B Core Ab. Hep B viral load negative.   Same pattern during hospitalization 2010 : highest alkaline phosphatase was 355, AST 259, and ALT 871. ANA, rheumatoid factor, ceruloplasmin, CMV IgM, alpha antitrypsin, and AMA, all within normal limits.   Encounter for power mobility device assessment 03/14/2020   Erythrocytosis 07/04/2016   Gout    Pt has never had a crystal diagnosis.   Hepatitis B antibody positive 2013   IgM Hep B core Ab + but Hepatitis B viral load negative.    Hyperlipidemia    At goal of LDL <100 with statin.   Hypertension    ACEI monotherapy   Leukocytosis 01/11/2014   Since 2010. Smear 2015 mild left shift. Is on prednisone for RA but increase  started before leukocytosis.    NASH (nonalcoholic steatohepatitis), ? some EtOH contribution and fibrosis  05/17/2011   Elevated since 05/2008 during hospitalization. Relatively stable. Extensive W/U and no etiology but likely fatty liver 2/2 obesity despite normal imaging.  Hepatitis - all negative. There was an initial + IgM Hep B core Ab but repeat testing was negative and Hep B viral load was negative.  TTG 06/15/2013  ANA x 2 (05/07/11 and 06/13/2008) ASMA x2 (07/18/2011 negative & weakly +  at 21 on 05/27/2011 & nega   OA (osteoarthritis)    Obesity, Class III, BMI 40-49.9 (morbid obesity) ([place])    Obstructive sleep apnea 08/21/08   Sleep study AHI 16.4 with desat to 66%. CPAP of 17 decreased AHI to 0.9. Non-compliant with CPAP.   RA (rheumatoid arthritis) ([place]) 2013   Shortness of breath    Tobacco abuse    Per pt, he has a diagnosis of COPD. Need to locate PFT's.   Wheelchair bound     PSH:   Past Surgical History:  Procedure Laterality Date   ESOPHAGOGASTRODUODENOSCOPY (EGD) WITH PROPOFOL N/A 05/08/2022   Procedure: ESOPHAGOGASTRODUODENOSCOPY (EGD) WITH PROPOFOL;  Surgeon: Daryel November, MD;  Location: [place];  Service: Gastroenterology;  Laterality: N/A;   IR PARACENTESIS  05/09/2022   IR PARACENTESIS  05/31/2022   LAMINECTOMY  1995   L4-L5   TONSILLECTOMY     TOTAL HIP ARTHROPLASTY Left 2009    Allergies: Not on File  Medications:   Prior to Admission medications   Medication Sig Start Date End Date Taking? Authorizing Provider  albuterol (VENTOLIN HFA) 108 (90 Base) MCG/ACT inhaler INHALE 1-2 PUFFS INTO THE LUNGS EVERY 4 (FOUR) HOURS AS NEEDED. SHORTNESS OF BREATH Patient taking differently: Inhale 2 puffs into the lungs every 4 (four) hours as needed for wheezing or shortness of breath. 12/31/21  Yes [name], MD  ALPRAZolam (XANAX) 0.5 MG tablet Take 1 tablet (0.5 mg total) by mouth 2 (two) times daily as needed for anxiety. 05/15/22  Yes Lucious Groves, DO   atorvastatin (LIPITOR) 40 MG tablet TAKE 1 TABLET BY MOUTH EVERY DAY Patient taking differently: Take 40 mg by mouth daily. 08/28/21  Yes [name], MD  furosemide (LASIX) 40 MG tablet Take 1 tablet (40 mg total) by mouth 2 (two) times daily. 05/23/22  Yes Lucious Groves, DO  HYDROcodone-acetaminophen (NORCO) 10-325 MG tablet Take 1 tablet by mouth every 6 (six) hours as needed. 05/15/22  Yes Lucious Groves, DO  nicotine (NICODERM CQ - DOSED IN MG/24 HOURS) 14 mg/24hr patch Place 1 patch (14 mg total) onto the skin daily. 05/11/22  Yes Serita Butcher, MD  predniSONE (DELTASONE) 5 MG tablet Take 1 tablet (5 mg total) by mouth daily with breakfast. 05/11/22  Yes Serita Butcher, MD  spironolactone (ALDACTONE) 100 MG tablet Take 2 tablets (200 mg total) by mouth daily. Patient taking differently: Take 100 mg by mouth daily. 05/23/22  Yes Lucious Groves, DO  Tiotropium Bromide-Olodaterol (STIOLTO RESPIMAT) 2.5-2.5 MCG/ACT AERS INHALE 2 PUFFS BY MOUTH INTO THE LUNGS DAILY Patient taking differently: Inhale 2 each into the lungs daily. 04/01/22  Yes Lucious Groves, DO  ACCU-CHEK SOFTCLIX LANCETS lancets Check blood sugar once a day 12/25/17   Bartholomew Crews, MD  acetic acid 2 % otic solution Place 4 drops into both ears daily as needed. Patient not taking: Reported on 06/21/2022 03/05/22   Farrel Gordon, DO  Adalimumab (HUMIRA PEN) 40 MG/0.4ML PNKT Inject 40 mg into the skin every 14 (fourteen) days. Patient not taking: Reported on 06/22/2022    [provider]  Blood Glucose Monitoring Suppl (ACCU-CHEK AVIVA PLUS) w/Device KIT Check blood sugar once a day 04/19/16   Bartholomew Crews, MD  glucose blood (ACCU-CHEK AVIVA PLUS) test strip Check blood sugar once a day 12/25/17   Bartholomew Crews, MD  hydroxychloroquine (PLAQUENIL) 200 MG tablet Take 200 mg by mouth 2 (two) times daily. Patient not taking: Reported on  05/29/2022 10/17/20   [provider]  pantoprazole (PROTONIX) 40  MG tablet TAKE 1 TABLET BY MOUTH EVERY DAY 06/05/22   Lucious Groves, DO    Inpatient medications:  artificial tears   Both Eyes Q8H   atorvastatin  40 mg Per Tube Daily   Chlorhexidine Gluconate Cloth  6 each Topical Q0600   docusate  100 mg Per Tube BID   feeding supplement (PROSource TF20)  60 mL Per Tube BID   folic acid  1 mg Per Tube Daily   furosemide  40 mg Intravenous Q6H   insulin aspart  0-20 Units Subcutaneous Q4H   insulin aspart  10 Units Subcutaneous Q4H   insulin glargine-yfgn  35 Units Subcutaneous Daily   ipratropium-albuterol  3 mL Nebulization Q4H   lactulose  30 g Per Tube TID   midodrine  10 mg Per Tube Q8H   multivitamin with minerals  1 tablet Per Tube Daily   mouth rinse  15 mL Mouth Rinse Q2H   pantoprazole (PROTONIX) IV  40 mg Intravenous Daily   polyethylene glycol  17 g Per Tube BID   predniSONE  5 mg Per Tube Q breakfast   QUEtiapine  50 mg Per Tube BID   sennosides  10 mL Per Tube BID   sodium zirconium cyclosilicate  10 g Per Tube BID   thiamine  100 mg Per Tube Daily    Discontinued Meds:   Medications Discontinued During This Encounter  Medication Reason   azithromycin (ZITHROMAX) tablet 500 mg    azithromycin (ZITHROMAX) tablet 250 mg    cefTRIAXone (ROCEPHIN) 1 g in sodium chloride 0.9 % 100 mL IVPB    spironolactone (ALDACTONE) tablet 200 mg    ipratropium-albuterol (DUONEB) 0.5-2.5 (3) MG/3ML nebulizer solution 3 mL    albumin human 25 % solution 100 g    predniSONE (DELTASONE) tablet 40 mg    ALPRAZolam (XANAX) tablet 0.5 mg    arformoterol (BROVANA) nebulizer solution 15 mcg    umeclidinium bromide (INCRUSE ELLIPTA) 62.5 MCG/ACT 1 puff    enoxaparin (LOVENOX) injection 80 mg    HYDROcodone-acetaminophen (NORCO) 10-325 MG per tablet 1 tablet    dexmedetomidine (PRECEDEX) 400 MCG/100ML (4 mcg/mL) infusion    pantoprazole (PROTONIX) EC tablet 40 mg    azithromycin (ZITHROMAX) tablet 500 mg    cefTRIAXone (ROCEPHIN) 1 g in sodium  chloride 0.9 % 100 mL IVPB    HYDROcodone-acetaminophen (NORCO/VICODIN) 5-325 MG per tablet 2 tablet    atorvastatin (LIPITOR) tablet 40 mg    feeding supplement (VITAL HIGH PROTEIN) liquid 1,000 mL    feeding supplement (PROSource TF20) liquid 60 mL    norepinephrine (LEVOPHED) 4mg  in 240mL (0.016 mg/mL) premix infusion Dose change   insulin aspart (novoLOG) injection 0-9 Units    acetaZOLAMIDE (DIAMOX) tablet 500 mg    nicotine (NICODERM CQ - dosed in mg/24 hours) patch 14 mg    polyethylene glycol (MIRALAX / GLYCOLAX) packet 17 g    thiamine (VITAMIN B1) injection 100 mg    insulin glargine-yfgn (SEMGLEE) injection 5 Units    fentaNYL (SUBLIMAZE) injection 50 mcg Inpatient Standard   fentaNYL (SUBLIMAZE) injection 50-200 mcg Inpatient Standard   QUEtiapine (SEROQUEL) tablet 50 mg    insulin aspart (novoLOG) injection 4 Units    insulin glargine-yfgn (SEMGLEE) injection 10 Units    insulin aspart (novoLOG) injection 6 Units    QUEtiapine (SEROQUEL) tablet 50 mg    insulin aspart (novoLOG) injection 8 Units  insulin glargine-yfgn (SEMGLEE) injection 15 Units    insulin glargine-yfgn (SEMGLEE) injection 25 Units    feeding supplement (PROSource TF20) liquid 60 mL    midazolam (VERSED) injection 1 mg    albuterol (PROVENTIL) (2.5 MG/3ML) 0.083% nebulizer solution Returned to ADS   heparin injection 5,000 Units    lactulose (CHRONULAC) 10 GM/15ML solution 30 g    methylPREDNISolone sodium succinate (SOLU-MEDROL) 125 mg/2 mL injection 80 mg    furosemide (LASIX) injection 40 mg    sodium zirconium cyclosilicate (LOKELMA) packet 10 g Duplicate   sodium zirconium cyclosilicate (LOKELMA) packet 10 g    oxyCODONE (Oxy IR/ROXICODONE) immediate release tablet 5 mg     Social History:  reports that he has been smoking cigarettes. He has a 3.00 pack-year smoking history. He has never used smokeless tobacco. He reports that he does not drink alcohol and does not use drugs.  Family History:    Family History  Problem Relation Age of Onset   Diabetes Mother    Heart attack Mother    Heart disease Mother    Diabetes Father    Kidney disease Father    Obesity Brother     Review of systems not obtained due to patient factors. Weight change: 2 kg  Intake/Output Summary (Last 24 hours) at 06/07/2022 1200 Last data filed at 06/07/2022 0600 Gross per 24 hour  Intake 732.73 ml  Output 580 ml  Net 152.73 ml   BP (!) 109/52   Pulse 93   Temp 97.9 F (36.6 C) (Axillary)   Resp 11   Ht 5\' 11"  (1.803 m)   Wt (!) 153.1 kg   SpO2 97%   BMI 47.08 kg/m  Vitals:   06/07/22 0900 06/07/22 1000 06/07/22 1110 06/07/22 1117  BP: 113/73 (!) 109/52    Pulse: 90 93    Resp: 12 11    Temp:    97.9 F (36.6 C)  TempSrc:    Axillary  SpO2: 95% 94% 97%   Weight:      Height:         General appearance: morbidly obese and intubated and sedated Head: Normocephalic, without obvious abnormality, atraumatic Resp: rhonchi bilaterally Cardio: tachy no rub GI: obese, tense, distended, +BS Extremities: edema 2+ anasarca  Labs: Basic Metabolic Panel: Recent Labs  Lab 06/01/22 0610 06/01/22 0731 06/02/22 0432 06/03/22 0540 06/03/22 0541 06/04/22 0429 06/05/22 0332 06/05/22 1638 06/06/22 0203 06/06/22 0515 06/06/22 1040 06/06/22 1258 06/06/22 1724 06/06/22 2010 06/07/22 0041 06/07/22 0427 06/07/22 0918  NA 125*   < > 126*  --  129* 129* 131* 132* 132*  --  133* 133*  --   --   --  133*  --   K 4.8   < > 4.7  --  4.8 5.1 5.3* 5.9* 6.1*   < > 5.8* 6.1*  6.1* 6.1* 6.2* 6.1* 5.8* 6.0*  CL 92*  --  92*  --  95* 99 101 101 102  --   --  100  --   --   --  103  --   CO2 19*  --  19*  --  20* 21* 21* 22 23  --   --  22  --   --   --  21*  --   GLUCOSE 162*  --  258*  --  251* 237* 301* 296* 305*  --   --  164*  --   --   --  220*  --  BUN 54*  --  63*  --  77* 100* 120* 130* 138*  --   --  147*  --   --   --  155*  --   CREATININE 2.32*  --  2.15*  --  1.97* 2.08* 2.08* 2.03*  2.12*  --   --  2.04*  --   --   --  2.12*  --   ALBUMIN 3.2*  --   --   --   --   --  2.9* 2.8* 2.7*  --   --   --   --   --   --  2.7* 2.8*  CALCIUM 8.9  --  9.1  --  9.0 8.7* 8.7* 8.6* 8.5*  --   --  8.7*  --   --   --  8.6*  --   PHOS  --    < > 5.2* 4.1  --  3.8 3.6 4.5 4.8*  --   --   --   --   --   --  4.9*  --    < > = values in this interval not displayed.   Liver Function Tests: Recent Labs  Lab 06/01/22 0610 06/05/22 0332 06/06/22 0203 06/07/22 0427 06/07/22 0918  AST 75*  --   --   --  77*  ALT 61*  --   --   --  64*  ALKPHOS 406*  --   --   --  190*  BILITOT 5.4*  --   --   --  5.0*  PROT 6.7  --   --   --  5.7*  ALBUMIN 3.2*   < > 2.7* 2.7* 2.8*   < > = values in this interval not displayed.   No results for input(s): "LIPASE", "AMYLASE" in the last 168 hours. Recent Labs  Lab 06/02/22 0806 06/06/22 0449  AMMONIA 39* 83*   CBC: Recent Labs  Lab 06/01/22 0610 06/01/22 0731 06/04/22 0429 06/05/22 0332 06/06/22 0203 06/06/22 1040 06/07/22 0427  WBC 18.4*   < > 12.1* 13.6* 14.1*  --  17.5*  NEUTROABS 15.3*  --   --   --   --   --   --   HGB 12.6*   < > 11.7* 11.4* 11.2* 11.6* 10.9*  HCT 34.2*   < > 32.0* 31.6* 32.1* 34.0* 32.4*  MCV 89.8   < > 90.7 90.5 94.4  --  95.6  PLT 100*   < > 56* 53* 47*  --  45*   < > = values in this interval not displayed.   PT/INR: @LABRCNTIP (inr:5) Cardiac Enzymes: )No results for input(s): "CKTOTAL", "CKMB", "CKMBINDEX", "TROPONINI" in the last 168 hours. CBG: Recent Labs  Lab 06/06/22 1920 06/06/22 2311 06/07/22 0305 06/07/22 0819 06/07/22 1114  GLUCAP 166* 174* 159* 167* 182*    Iron Studies: No results for input(s): "IRON", "TIBC", "TRANSFERRIN", "FERRITIN" in the last 168 hours.  Xrays/Other Studies: DG Abd 1 View  Result Date: 06/07/2022 CLINICAL DATA:  Abdominal distension. EXAM: ABDOMEN - 1 VIEW COMPARISON:  Radiographs 06/06/2022.  CT 05/08/2022. FINDINGS: Four supine views of the abdomen are  submitted. Examination limited by body habitus. A nasogastric tube projects over the mid stomach. Contrast material remains in the dependent portion of the gastric fundus. Colonic distension appears minimally improved from yesterday. No evidence of extravasated contrast or pneumoperitoneum. Advanced right hip osteoarthritis again noted. Previous left total hip arthroplasty. IMPRESSION: Minimal improvement in colonic distension. No evidence  of extravasated contrast or pneumoperitoneum. Electronically Signed   By: Richardean Sale M.D.   On: 06/07/2022 09:00   DG CHEST PORT 1 VIEW  Result Date: 06/06/2022 CLINICAL DATA:  [id number] Dyspnea [id number] EXAM: PORTABLE CHEST - 1 VIEW COMPARISON:  06/04/2022 FINDINGS: Endotracheal tube, gastric tube, right IJ central line stable in position. Slight improvement in diffuse bilateral infiltrates or edema. Heart size and mediastinal contours are within normal limits. Aortic Atherosclerosis (ICD10-170.0). No effusion. Visualized bones unremarkable. IMPRESSION: 1. Slight improvement in bilateral infiltrates or edema. 2. Support hardware stable in position. Electronically Signed   By: Lucrezia Europe M.D.   On: 06/06/2022 08:52   DG Abd 1 View  Result Date: 06/06/2022 CLINICAL DATA:  [id number] Ileus ([place]) [id number] EXAM: ABDOMEN - 1 VIEW COMPARISON:  None Available. FINDINGS: Enteric tube courses below the hemidiaphragm with tip and side port overlying the expected region of the gastric lumen. PO contrast noted within the gastric lumen. Gaseous distension the large bowel. No radio-opaque calculi or other significant radiographic abnormality are seen. Severe degenerative changes right hip with associated right femoral head avascular necrosis. Total left hip arthroplasty. IMPRESSION: 1. Enteric tube in good position. 2. Severe degenerative changes right hip with associated right femoral head avascular necrosis. Electronically Signed   By: Iven Finn M.D.   On: 06/06/2022 03:42      Assessment/Plan:  AKI - in setting of decompensated cirrhosis and decreasing UOP concerning for hepatorenal syndrome.  Urine Na consistent with HRS.  Will add octreotide to midodrine and albumin.  No urgent indication for CRRT at this time and would discuss with family regarding goals of care.  I don't think he is a transplant candidate since he has not followed up since November 2022.  Without transplant, would not recommend starting CRRT.   Avoid nephrotoxic medications including NSAIDs and iodinated intravenous contrast exposure unless the latter is absolutely indicated.   Preferred narcotic agents for pain control are hydromorphone, fentanyl, and methadone. Morphine should not be used.  Avoid Baclofen and avoid oral sodium phosphate and magnesium citrate based laxatives / bowel preps.  Continue strict Input and Output monitoring.  Will monitor the patient closely with you and intervene or adjust therapy as indicated by changes in clinical status/labs  Hyperkalemia - continue with lasix and lokelma.  Unfortunately no BM despite multiple agents and enema.  Will increase dose of IV lasix and add albumin prior to diuretic. Acute hypoxic respiratory failure - on vent per PCCM.  Also with pulmonary edema, copd exacerbation, and possible CAP. Decompensated cirrhosis secondary to NASH - second hospitalization in the past 30 days.  Poor overall prognosis without transplantation as an option. DM type 2 - per primary svc Rheumatoid arthritis - on prednisone 5 mg daily Disposition - poor overall prognosis.  Recommend transition to comfort measures.    Broadus John A Ladasha Schnackenberg 06/07/2022, 12:00 PM

## 2022-06-07 NOTE — Progress Notes (Incomplete)
Attending note: I have seen and examined the patient. History, labs and imaging reviewed.  62 Y/O with NASH, OSA, DM, RA. Intubated for COPD exacerbation, CAP, pulmonary edema s/p abx and steroids.  Blood pressure 111/66, pulse 94, temperature 97.7 F (36.5 C), temperature source Axillary, resp. rate 14, height 5\' 11"  (1.803 m), weight (!) 153.1 kg, SpO2 96 %. Gen:      No acute distress HEENT:  EOMI, sclera anicteric Neck:     No masses; no thyromegaly Lungs:    Clear to auscultation bilaterally; normal respiratory effort*** CV:         Regular rate and rhythm; no murmurs Abd:      Distended Ext:    No edema; adequate peripheral perfusion Skin:      Warm and dry; no rash Neuro: alert and oriented x 3 Psych: normal mood and affect   Labs/Imaging personally reviewed, significant for Na 133, K 5.8 BUN/Cr 155/2.12, phos 4.7   Assessment/plan: Acute resp failure On precedex and fentanyl Remains on vent. Mental status is a barrier to extubation. Wean as tolerated Follow intermittent chest x ray  Decompensated NASH cirrhosis AKI, hyperkalemia Lasix and albumin Has not made much urine and may be going into HRS Will get renal involved Check LFTs  Abd distention KUB does not show ileus Bowel regimen  Rt IJ Foley and peripherals Restart trickle tube feeds  The patient is critically ill with multiple organ systems failure and requires high complexity decision making for assessment and support, frequent evaluation and titration of therapies, application of advanced monitoring technologies and extensive interpretation of multiple databases.  Critical care time - 35 mins. This represents my time independent of the NPs time taking care of the pt.  Marshell Garfinkel MD [facility] 06/07/2022, 9:29 AM

## 2022-06-07 NOTE — Progress Notes (Signed)
NAME:  Andrew Fowler, MRN:  [id number], DOB:  [birth year], LOS: 7 ADMISSION DATE:  06/14/2022, CONSULTATION DATE:  06/02/2022 REFERRING MD:  Dr Markus Jarvis, CHIEF COMPLAINT:   Acute hypoxic respiratory failure  History of Present Illness:  Patient is a 62 year old male with pertinent PMH NASH cirrhosis, COPD, OSA, RA, T2DM, severe obesity presents to [facility] ED on 3/7 with SOB.   Patient states he has been having progressively worsening SOB over the past 10 days.  States he has noticed more wheezing and productive cough.  Denies fever/chills.  Also noticed more swelling in legs and body and admits to gaining weight.  States he has been taking his home medications faithfully.  On 3/7 came to [facility] ED for further eval.  Vital stable.  Sats 95% on room air.  Labs showing significant hyponatremia, AKI, elevated liver enzymes , low albumin, LA 4.2 then 2.9.  Chronic thrombocytopenia 104.  Patient was given IV Lasix and some breathing treatments which helped with breathing.  Started on steroids.  CXR showing perihilar opacities.  Started on rocephin/azithromycin. On 3/8 paracentesis was performed pulling 2.4L of clear yellow fluid.  Albumin given.  Spironolactone/Lasix held due to elevated creatinine.   On 3/9 patient with worsening respiratory distress at 2 AM.  Patient sats dropped to 76% on 2 LNC.  Patient was placed on NRB with improvement in sats.  CXR showing worsening infiltrate versus edema bilaterally.  Patient transferred to progressive and given Lasix.  Patient only put out 300 mL UOP.  ABG 7.31, 42, 111, 21.  Patient with sats only 88% on NRB and still in respiratory distress.  PCCM consulted.  Pertinent  Medical History   Past Medical History:  Diagnosis Date   Abnormal laboratory test result 07/04/2011   Immunofixation electrophoresis 3/13 showed slightly restricted mobility in the IgG and kappa lanes and suggested repeat end of year.    Anxiety associated with depression 04/19/2011   On chronic benzos  and SSRI's. Gets panic attacks. Does not see mental health.   Chronic pain syndrome 04/19/2011   Combination of RA, obesity, OA, & DDD. Has seen Dr Sharol Given & s/p R total hip arthroplasty 2/2 OA & ? AVN. s/p L4-L5 laminectomy. Lumbar MRI 4/11 : L3-L4 mod central canal narrowing.  Cervical MRI 6/11 : multi-level DDD and L foraminal stenosis at C4-5 and C5-6.    Chronic venous insufficiency 2013   ECHO 2013 was normal. LFT's, creatinine, TSH all normal. Alb a bit low.   COPD (chronic obstructive pulmonary disease) ([place])    Per pt, he has a diagnosis of COPD. No PFT's. Uses Alb MDI less than once a month.   Diabetes mellitus    Non-insulin dependent Type II.   Edema 04/19/2011   ECHO 2013 was normal. LFT's, creatinine, TSH all normal. Alb a bit low. Likely 2/2 chronic venous insuff 2/2 weight.   Elevated liver function tests 05/17/2011   04/2010. Increased GGT (pt uses ETOH). AMA negative. ABD U/S limited but otherwise negative. Follow Alk phos level. AST & ALT also elevated. False + IgM Hep B Core Ab. Hep B viral load negative.   Same pattern during hospitalization 2010 : highest alkaline phosphatase was 355, AST 259, and ALT 871. ANA, rheumatoid factor, ceruloplasmin, CMV IgM, alpha antitrypsin, and AMA, all within normal limits.   Encounter for power mobility device assessment 03/14/2020   Erythrocytosis 07/04/2016   Gout    Pt has never had a crystal diagnosis.   Hepatitis B antibody positive  2013   IgM Hep B core Ab + but Hepatitis B viral load negative.    Hyperlipidemia    At goal of LDL <100 with statin.   Hypertension    ACEI monotherapy   Leukocytosis 01/11/2014   Since 2010. Smear 2015 mild left shift. Is on prednisone for RA but increase started before leukocytosis.    NASH (nonalcoholic steatohepatitis), ? some EtOH contribution and fibrosis  05/17/2011   Elevated since 05/2008 during hospitalization. Relatively stable. Extensive W/U and no etiology but likely fatty liver 2/2 obesity despite  normal imaging.  Hepatitis - all negative. There was an initial + IgM Hep B core Ab but repeat testing was negative and Hep B viral load was negative.  TTG 06/15/2013  ANA x 2 (05/07/11 and 06/13/2008) ASMA x2 (07/18/2011 negative & weakly + at 21 on 05/27/2011 & nega   OA (osteoarthritis)    Obesity, Class III, BMI 40-49.9 (morbid obesity) ([place])    Obstructive sleep apnea 08/21/08   Sleep study AHI 16.4 with desat to 66%. CPAP of 17 decreased AHI to 0.9. Non-compliant with CPAP.   RA (rheumatoid arthritis) ([place]) 2013   Shortness of breath    Tobacco abuse    Per pt, he has a diagnosis of COPD. Need to locate PFT's.   Wheelchair bound    Significant Hospital Events: Including procedures, antibiotic start and stop dates in addition to other pertinent events   3/7 admitted to [facility] for shortness of breath, admitted to floors 3/9 worsening respiratory distress with hypoxia, transferred to ICU, started on BiPAP.  Respiratory worsening,-intubated.  Requiring pressors  Interim History / Subjective:  No acute events overnight. Potassium remains elevated, minimum urine output.   Objective   Blood pressure 111/66, pulse 94, temperature 97.7 F (36.5 C), temperature source Axillary, resp. rate 14, height 5\' 11"  (1.803 m), weight (!) 153.1 kg, SpO2 96 %. CVP:  [9 mmHg-23 mmHg] 12 mmHg  Vent Mode: PSV;CPAP FiO2 (%):  [40 %] 40 % Set Rate:  [16 bmp] 16 bmp Vt Set:  [600 mL] 600 mL PEEP:  [8 cmH20] 8 cmH20 Pressure Support:  [8 cmH20] 8 cmH20 Plateau Pressure:  [19 cmH20-22 cmH20] 22 cmH20   Intake/Output Summary (Last 24 hours) at 06/07/2022 0831 Last data filed at 06/07/2022 0600 Gross per 24 hour  Intake 868.85 ml  Output 780 ml  Net 88.85 ml    Filed Weights   06/05/22 0335 06/06/22 0353 06/07/22 0511  Weight: (!) 153.5 kg (!) 151.1 kg (!) 153.1 kg   Examination: General: Chronically ill-appearing, laying in bed in no acute distress HENT: Endotracheal tube in place, moist oral  mucosa. Lungs:Normal work of breathing. Rhonchi and mild wheezing appreciated. Cardiovascular: Regular rate, rhythm. No murmurs. Warm extremities.  Abdomen: Taught, distended, tympanic. No bowel sounds appreciated.  Extremities: 2-3+ pitting edema bilaterally to hips Neuro: Somnolent, not arousable, not following directions  Resolved Hospital Problem list    Assessment & Plan:  #Acute hypoxemic, hypercapnic respiratory failure #Pulmonary edema, COPD exacerbation, possible community-acquired pneumonia I think most likely respiratory failure at this point is due to hypervolemia from cirrhosis and now acute renal failure. Agitation has also been an issue for him, we will trial pressure support and see how he does with this.  -Continue full ventilatory support -Diurese as below -Continue DuoNebs every 4 hours -SBT as tolerated -VAP protocol in place   #Decompensated cirrhosis secondary to NASH #Oliguric acute renal failure #Persistent hyperkalemia  Urine output 0.2cc/kg/hr over last  24h despite furosemide 120mg  daily. Creatinine has been stable, BUN continues climb with no significant changes in blood count. I suspect persistent hyperkalemia could be due to decreased renal function and no stool output over last 24h. Urine studies yesterday with appropriate fractional excretion of potassium, consistent with diuretic use. He still appears quite hypervolemic on exam. Given cirrhosis could be HRS, will plan to get nephrology involved.  - Consult to nephrology - Continue IV furosemide 40mg  x3 today - Lokelma daily - Continue midodrine 10mg  q8h - K check q4h, albuterol and insulin as needed - Strict I/O  #Abdominal distention #Constipation Enema yesterday with minimum stool output. Today on exam his abdomen is quite distended with no bowel sounds appreciated. I am concerned this could be a developing ileus or obstruction. Tube feeds are currently on hold, we will plan to get imaging. If no  bowel obstruction will plan to d/c oxycodone and initiate scheduled senokot. - Follow-up abdominal XR, might need CT abdomen/pelvis - Bowel rest - Hold further laxatives   #Type II diabetes mellitus Sugars largely at goal, CBG 167 this morning. Tube feeds on hold and he is back on his home steroid regimen.  - Continue semglee 35 units - Continue Novolog 10u + SSI q4h  #Rheumatoid arthritis -Continue home prednisone 5mg  daily  #Chronic thrombocytopenia #Anemia Platelets 45<47 this morning, holding prophylactic heparin. - Daily CBC - SCDs  #Tobacco use disorder -Continue nicotine patch  Best Practice (right click and "Reselect all SmartList Selections" daily)   Diet/type: tubefeeds DVT prophylaxis: SCD GI prophylaxis: PPI Lines: Central line Foley:  Yes, and it is still needed Code Status:  full code Last date of multidisciplinary goals of care discussion: Discussion with brother and sister 3/12  Labs   CBC: Recent Labs  Lab 06/01/22 0610 06/01/22 0731 06/03/22 0541 06/04/22 0429 06/05/22 0332 06/06/22 0203 06/06/22 1040 06/07/22 0427  WBC 18.4*   < > 12.5* 12.1* 13.6* 14.1*  --  17.5*  NEUTROABS 15.3*  --   --   --   --   --   --   --   HGB 12.6*   < > 11.4* 11.7* 11.4* 11.2* 11.6* 10.9*  HCT 34.2*   < > 32.8* 32.0* 31.6* 32.1* 34.0* 32.4*  MCV 89.8   < > 92.1 90.7 90.5 94.4  --  95.6  PLT 100*   < > 70* 56* 53* 47*  --  45*   < > = values in this interval not displayed.     Basic Metabolic Panel: Recent Labs  Lab 06/01/22 0937 06/01/22 1104 06/01/22 1732 06/02/22 0322 06/02/22 0432 06/03/22 0540 06/03/22 0541 06/04/22 0429 06/05/22 0332 06/05/22 1638 06/06/22 0203 06/06/22 0515 06/06/22 1040 06/06/22 1258 06/06/22 1724 06/06/22 2010 06/07/22 0041 06/07/22 0427  NA  --    < >  --    < > 126*  --    < > 129* 131* 132* 132*  --  133* 133*  --   --   --  133*  K  --    < >  --    < > 4.7  --    < > 5.1 5.3* 5.9* 6.1*   < > 5.8* 6.1*  6.1* 6.1*  6.2* 6.1* 5.8*  CL  --   --   --   --  92*  --    < > 99 101 101 102  --   --  100  --   --   --  103  CO2  --   --   --   --  19*  --    < > 21* 21* 22 23  --   --  22  --   --   --  21*  GLUCOSE  --   --   --   --  258*  --    < > 237* 301* 296* 305*  --   --  164*  --   --   --  220*  BUN  --   --   --   --  63*  --    < > 100* 120* 130* 138*  --   --  147*  --   --   --  155*  CREATININE  --   --   --   --  2.15*  --    < > 2.08* 2.08* 2.03* 2.12*  --   --  2.04*  --   --   --  2.12*  CALCIUM  --   --   --   --  9.1  --    < > 8.7* 8.7* 8.6* 8.5*  --   --  8.7*  --   --   --  8.6*  MG 2.3  --  2.4  --  2.6* 2.7*  --  2.6*  --   --   --   --   --   --   --   --   --   --   PHOS 5.6*  --  6.2*  --  5.2* 4.1  --  3.8 3.6 4.5 4.8*  --   --   --   --   --   --  4.9*   < > = values in this interval not displayed.    GFR: Estimated Creatinine Clearance: 55.1 mL/min (A) (by C-G formula based on SCr of 2.12 mg/dL (H)). Recent Labs  Lab 05/31/22 0943 06/01/22 0610 06/04/22 0429 06/05/22 0332 06/06/22 0203 06/06/22 1125 06/07/22 0427  WBC  --    < > 12.1* 13.6* 14.1*  --  17.5*  LATICACIDVEN 1.8  --   --   --   --  1.6  --    < > = values in this interval not displayed.     Liver Function Tests: Recent Labs  Lab 06/01/22 0610 06/05/22 0332 06/05/22 1638 06/06/22 0203 06/07/22 0427  AST 75*  --   --   --   --   ALT 61*  --   --   --   --   ALKPHOS 406*  --   --   --   --   BILITOT 5.4*  --   --   --   --   PROT 6.7  --   --   --   --   ALBUMIN 3.2* 2.9* 2.8* 2.7* 2.7*    No results for input(s): "LIPASE", "AMYLASE" in the last 168 hours.  Recent Labs  Lab 06/02/22 0806 06/06/22 0449  AMMONIA 39* 83*     ABG    Component Value Date/Time   PHART 7.293 (L) 06/06/2022 1040   PCO2ART 47.4 06/06/2022 1040   PO2ART 134 (H) 06/06/2022 1040   HCO3 23.1 06/06/2022 1040   TCO2 25 06/06/2022 1040   ACIDBASEDEF 4.0 (H) 06/06/2022 1040   O2SAT 99 06/06/2022 1040      Coagulation Profile: Recent Labs  Lab 06/05/22 0332 06/06/22 0203 06/07/22 0427  INR  1.5* 1.5* 1.5*     Cardiac Enzymes: No results for input(s): "CKTOTAL", "CKMB", "CKMBINDEX", "TROPONINI" in the last 168 hours.  HbA1C: Hemoglobin A1C  Date/Time Value Ref Range Status  03/05/2022 10:13 AM 6.1 (A) 4.0 - 5.6 % Final  07/09/2021 12:13 PM 5.4 4.0 - 5.6 % Final  09/07/2018 12:00 AM 6.5  Final   Hgb A1c MFr Bld  Date/Time Value Ref Range Status  05/16/2012 04:48 AM 8.7 (H) <5.7 % Final    Comment:    (NOTE)                                                                       According to the ADA Clinical Practice Recommendations for 2011, when HbA1c is used as a screening test:  >=6.5%   Diagnostic of Diabetes Mellitus           (if abnormal result is confirmed) 5.7-6.4%   Increased risk of developing Diabetes Mellitus References:Diagnosis and Classification of Diabetes Mellitus,Diabetes Care,2011,34(Suppl 1):S62-S69 and Standards of Medical Care in         Diabetes - 2011,Diabetes [id number] (Suppl 1):S11-S61.  05/06/2011 09:15 PM 6.1 (H) <5.7 % Final    Comment:    (NOTE)                                                                       According to the ADA Clinical Practice Recommendations for 2011, when HbA1c is used as a screening test:  >=6.5%   Diagnostic of Diabetes Mellitus           (if abnormal result is confirmed) 5.7-6.4%   Increased risk of developing Diabetes Mellitus References:Diagnosis and Classification of Diabetes Mellitus,Diabetes [id number] 1):S62-S69 and Standards of Medical Care in         Diabetes - 2011,Diabetes Care,2011,34 (Suppl 1):S11-S61.    CBG: Recent Labs  Lab 06/06/22 1729 06/06/22 1920 06/06/22 2311 06/07/22 0305 06/07/22 0819  GLUCAP 159* 166* 174* 159* 167*     Review of Systems:   Unable to provide history  Past Medical History:  He,  has a past medical history of Abnormal laboratory test result  (07/04/2011), Anxiety associated with depression (04/19/2011), Chronic pain syndrome (04/19/2011), Chronic venous insufficiency (2013), COPD (chronic obstructive pulmonary disease) ([place]), Diabetes mellitus, Edema (04/19/2011), Elevated liver function tests (05/17/2011), Encounter for power mobility device assessment (03/14/2020), Erythrocytosis (07/04/2016), Gout, Hepatitis B antibody positive (2013), Hyperlipidemia, Hypertension, Leukocytosis (01/11/2014), NASH (nonalcoholic steatohepatitis), ? some EtOH contribution and fibrosis  (05/17/2011), OA (osteoarthritis), Obesity, Class III, BMI 40-49.9 (morbid obesity) ([place]), Obstructive sleep apnea (08/21/08), RA (rheumatoid arthritis) ([place]) (2013), Shortness of breath, Tobacco abuse, and Wheelchair bound.   Surgical History:   Past Surgical History:  Procedure Laterality Date   ESOPHAGOGASTRODUODENOSCOPY (EGD) WITH PROPOFOL N/A 05/08/2022   Procedure: ESOPHAGOGASTRODUODENOSCOPY (EGD) WITH PROPOFOL;  Surgeon: Daryel November, MD;  Location[facility] ENDOSCOPY;  Service: Gastroenterology;  Laterality: N/A;   IR PARACENTESIS  05/09/2022   IR PARACENTESIS  05/31/2022  LAMINECTOMY  1995   L4-L5   TONSILLECTOMY     TOTAL HIP ARTHROPLASTY Left 2009     Social History:   reports that he has been smoking cigarettes. He has a 3.00 pack-year smoking history. He has never used smokeless tobacco. He reports that he does not drink alcohol and does not use drugs.   Family History:  His family history includes Diabetes in his father and mother; Heart attack in his mother; Heart disease in his mother; Kidney disease in his father; Obesity in his brother.   Allergies Not on File   Sanjuan Dame, MD Internal Medicine PGY-3 Pager: [phone]

## 2022-06-08 DIAGNOSIS — K7460 Unspecified cirrhosis of liver: Secondary | ICD-10-CM | POA: Diagnosis not present

## 2022-06-08 DIAGNOSIS — Z515 Encounter for palliative care: Secondary | ICD-10-CM | POA: Diagnosis not present

## 2022-06-08 DIAGNOSIS — Z7189 Other specified counseling: Secondary | ICD-10-CM | POA: Diagnosis not present

## 2022-06-08 DIAGNOSIS — K7290 Hepatic failure, unspecified without coma: Secondary | ICD-10-CM | POA: Diagnosis not present

## 2022-06-08 LAB — BASIC METABOLIC PANEL
Anion gap: 15 (ref 5–15)
BUN: 160 mg/dL — ABNORMAL HIGH (ref 8–23)
CO2: 21 mmol/L — ABNORMAL LOW (ref 22–32)
Calcium: 8.8 mg/dL — ABNORMAL LOW (ref 8.9–10.3)
Chloride: 98 mmol/L (ref 98–111)
Creatinine, Ser: 2.62 mg/dL — ABNORMAL HIGH (ref 0.61–1.24)
GFR, Estimated: 27 mL/min — ABNORMAL LOW (ref >60)
Glucose, Bld: 155 mg/dL — ABNORMAL HIGH (ref 70–99)
Potassium: 6.4 mmol/L (ref 3.5–5.1)
Sodium: 134 mmol/L — ABNORMAL LOW (ref 135–145)

## 2022-06-08 LAB — CBC
HCT: 34.5 % — ABNORMAL LOW (ref 39.0–52.0)
Hemoglobin: 11.6 g/dL — ABNORMAL LOW (ref 13.0–17.0)
MCH: 32.7 pg (ref 26.0–34.0)
MCHC: 33.6 g/dL (ref 30.0–36.0)
MCV: 97.2 fL (ref 80.0–100.0)
Platelets: 79 10*3/uL — ABNORMAL LOW (ref 150–400)
RBC: 3.55 MIL/uL — ABNORMAL LOW (ref 4.22–5.81)
RDW: 19.9 % — ABNORMAL HIGH (ref 11.5–15.5)
WBC: 39.5 10*3/uL — ABNORMAL HIGH (ref 4.0–10.5)
nRBC: 0.2 % (ref 0.0–0.2)

## 2022-06-08 LAB — GLUCOSE, CAPILLARY
Glucose-Capillary: 136 mg/dL — ABNORMAL HIGH (ref 70–99)
Glucose-Capillary: 149 mg/dL — ABNORMAL HIGH (ref 70–99)
Glucose-Capillary: 150 mg/dL — ABNORMAL HIGH (ref 70–99)

## 2022-06-08 LAB — COMPREHENSIVE METABOLIC PANEL
ALT: 66 U/L — ABNORMAL HIGH (ref 0–44)
AST: 79 U/L — ABNORMAL HIGH (ref 15–41)
Albumin: 3.4 g/dL — ABNORMAL LOW (ref 3.5–5.0)
Alkaline Phosphatase: 170 U/L — ABNORMAL HIGH (ref 38–126)
Anion gap: 13 (ref 5–15)
BUN: 159 mg/dL — ABNORMAL HIGH (ref 8–23)
CO2: 21 mmol/L — ABNORMAL LOW (ref 22–32)
Calcium: 8.9 mg/dL (ref 8.9–10.3)
Chloride: 97 mmol/L — ABNORMAL LOW (ref 98–111)
Creatinine, Ser: 2.62 mg/dL — ABNORMAL HIGH (ref 0.61–1.24)
GFR, Estimated: 27 mL/min — ABNORMAL LOW (ref >60)
Glucose, Bld: 170 mg/dL — ABNORMAL HIGH (ref 70–99)
Potassium: 6.2 mmol/L — ABNORMAL HIGH (ref 3.5–5.1)
Sodium: 131 mmol/L — ABNORMAL LOW (ref 135–145)
Total Bilirubin: 7 mg/dL — ABNORMAL HIGH (ref 0.3–1.2)
Total Protein: 5.9 g/dL — ABNORMAL LOW (ref 6.5–8.1)

## 2022-06-08 LAB — PROTIME-INR
INR: 1.5 — ABNORMAL HIGH (ref 0.8–1.2)
Prothrombin Time: 17.6 seconds — ABNORMAL HIGH (ref 11.4–15.2)

## 2022-06-08 LAB — AMMONIA: Ammonia: 108 umol/L — ABNORMAL HIGH (ref 9–35)

## 2022-06-08 LAB — POTASSIUM: Potassium: 6.1 mmol/L — ABNORMAL HIGH (ref 3.5–5.1)

## 2022-06-08 MED ORDER — ALBUTEROL SULFATE (2.5 MG/3ML) 0.083% IN NEBU
10.0000 mg | INHALATION_SOLUTION | Freq: Once | RESPIRATORY_TRACT | Status: AC
  Administered 2022-06-08: 10 mg via RESPIRATORY_TRACT
  Filled 2022-06-08: qty 12

## 2022-06-08 MED ORDER — SODIUM ZIRCONIUM CYCLOSILICATE 10 G PO PACK: 10.0000 g | PACK | Freq: Three times a day (TID) | ORAL | Status: DC

## 2022-06-08 MED ORDER — BIOTENE DRY MOUTH MT LIQD: 15.0000 mL | OROMUCOSAL | Status: DC | PRN

## 2022-06-08 MED ORDER — ONDANSETRON HCL 4 MG/2ML IJ SOLN: 4.0000 mg | Freq: Four times a day (QID) | INTRAMUSCULAR | Status: DC | PRN

## 2022-06-08 MED ORDER — ONDANSETRON 4 MG PO TBDP: 4.0000 mg | ORAL_TABLET | Freq: Four times a day (QID) | ORAL | Status: DC | PRN

## 2022-06-08 MED ORDER — ACETAMINOPHEN 325 MG PO TABS: 650.0000 mg | ORAL_TABLET | Freq: Four times a day (QID) | ORAL | Status: DC | PRN

## 2022-06-08 MED ORDER — SODIUM POLYSTYRENE SULFONATE 15 GM/60ML PO SUSP
15.0000 g | Freq: Once | ORAL | Status: AC
  Administered 2022-06-08: 15 g via RECTAL
  Filled 2022-06-08: qty 60

## 2022-06-08 MED ORDER — ACETAMINOPHEN 650 MG RE SUPP: 650.0000 mg | Freq: Four times a day (QID) | RECTAL | Status: DC | PRN

## 2022-06-08 MED ORDER — MIDAZOLAM HCL 2 MG/2ML IJ SOLN
2.0000 mg | INTRAMUSCULAR | Status: DC | PRN
  Administered 2022-06-08 (×2): 2 mg via INTRAVENOUS
  Filled 2022-06-08 (×2): qty 2

## 2022-06-08 MED ORDER — GLYCOPYRROLATE 0.2 MG/ML IJ SOLN
0.4000 mg | INTRAMUSCULAR | Status: DC | PRN
  Administered 2022-06-08: 0.4 mg via INTRAVENOUS
  Filled 2022-06-08: qty 2

## 2022-06-08 MED ORDER — INSULIN ASPART 100 UNIT/ML IV SOLN
5.0000 [IU] | Freq: Once | INTRAVENOUS | Status: AC
  Administered 2022-06-08: 5 [IU] via INTRAVENOUS

## 2022-06-08 MED ORDER — DEXTROSE 50 % IV SOLN
1.0000 | Freq: Once | INTRAVENOUS | Status: AC
  Administered 2022-06-08: 50 mL via INTRAVENOUS
  Filled 2022-06-08: qty 50

## 2022-06-24 NOTE — Consult Note (Signed)
Palliative Medicine Inpatient Consult Note  Consulting Provider:  Donato Heinz, MD   Reason for consult:   [place] Palliative Medicine Consult  Reason for Consult? goals/limits of care   2022/06/09  HPI:  Per intake H&P --> Patient is a 62 year old male with pertinent PMH NASH cirrhosis, COPD, OSA, RA, T2DM, severe obesity. In [facility] ICU support by mechanical ventilator in the setting of hypoxemic respiratory failure due to bilateral pulmonary infiltrates and an acute exacerbation of his COPD. Patient with signs of MSOD. Palliative care has been asked to get involved for further conversations related to goals of care.   Clinical Assessment/Goals of Care:  *Please note that this is a verbal dictation therefore any spelling or grammatical errors are due to the "[employer]" system interpretation.  I have reviewed medical records including EPIC notes, labs and imaging, received report from bedside RN, assessed the patient who generally ill-appearing on mechanical ventilator third spacing.   I met with patient's 3 siblings his brother Andrew Fowler and sisters Andrew Fowler and Andrew Fowler to further discuss diagnosis prognosis, GOC, EOL wishes, disposition and options.   I introduced Palliative Medicine as specialized medical care for people living with serious illness. It focuses on providing relief from the symptoms and stress of a serious illness. The goal is to improve quality of life for both the patient and the family.  Medical History Review and Understanding:  A review of Andrew Fowler's past medical history inclusive of his NASH cirrhosis, COPD, OSA, RA, T2DM, severe obesity was held.  Social History:  Andrew Fowler is from [facility].  He has never been married and has no children.  He formally worked as a Glass blower/designer.  He is a man who used to love shooting pool in his healthier years.  His brother shares that they would often go to [employer] and speak to  people within their town he is identified as a social man.  Andrew Fowler is a man of faith and practices within the [facility] denomination.  Functional and Nutritional State:  Preceding hospitalization Andrew Fowler lives with his brother Andrew Fowler and a 1 bedroom room.  They had been renting this out from their cousin.  He had gotten weaker over the last few years and his brother would help with things like dressing.  He has not had a bath or shower in over 10 years due to issues with standing and mobility.  He has been wheelchair-bound for roughly a decade.  His appetite was robust.  Advance Directives:  A detailed discussion was had today regarding advanced directives.  Patient sisters deferred to his brother Andrew Fowler to be his decision-maker as he has the closest relationship with Andrew Fowler.  Code Status:  Concepts specific to code status, artifical feeding and hydration, continued IV antibiotics and rehospitalization was had.  The difference between a aggressive medical intervention path  and a palliative comfort care path for this patient at this time was had.   Encouraged patient/family to consider DNR status understanding evidenced based poor outcomes in similar hospitalized patient, as the cause of arrest is likely associated with advanced chronic/terminal illness rather than an easily reversible acute cardio-pulmonary event. I explained that DNR/DNI does not change the medical plan and it only comes into effect after a person has arrested (died).  It is a protective measure to keep [place] from harming the patient in their last moments of life.  Andrew Fowler was agreeable to DNR with understanding that patient would not receive CPR, defibrillation, ACLS medications,  or intubation.   Discussion:  Dr. Vaughan Browner was able to join the conversation to provide patient's family a comprehensive medical update.  Dr. Vaughan Browner provided information that Andrew Fowler is as ill as he is potentially in the setting of her viral infection.  He went over the  various organ system failures that Andrew Fowler is experiencing inclusive of his liver and his kidneys.  He further reviewed that per discussion with nephrology he is not a dialysis candidate.  We discussed the options of continuing in the modality of treatment and care versus less aggressive options.   We talked about transition to comfort measures in house and what that would entail inclusive of medications to control pain, dyspnea, agitation, nausea, itching, and hiccups.   We discussed stopping all uneccessary measures such as cardiac monitoring, blood draws, needle sticks, and frequent vital signs. Utilized reflective listening throughout our time together.   Patients family is clear that Andrew Fowler would never want to be on aggressive measures. They are in agreement with compassionate extubation and comfort measures only.   Decision Maker: Andrew Fowler (Brother) [phone] (Home Phone)   SUMMARY OF RECOMMENDATIONS   DNAR  Comfort Care  Continue precedex and fentanyl gtts - change parameters to reflect comfort  Additional medications per [facility]  Anticipate in hospice death  Ongoing PMT support  Code Status/Advance Care Planning: DNAR  Palliative Prophylaxis:  Aspiration, Bowel Regimen, Delirium Protocol, Frequent Pain Assessment, Oral Care, Palliative Wound Care, and Turn Reposition  Additional Recommendations (Limitations, Scope, Preferences): Comfort care  Psycho-social/Spiritual:  Desire for further Chaplaincy support: Yes Additional Recommendations: Education on end of life processes   Prognosis: Hours  Discharge Planning: Discharge will be celestial  Vitals:   06-27-22 0530 Jun 27, 2022 0600  BP: (!) 114/42 (!) 131/48  Pulse: (!) 111 (!) 116  Resp: 15 12  Temp:    SpO2: 93% 96%    Intake/Output Summary (Last 24 hours) at 27-Jun-2022 [id number] Last data filed at 06/27/2022 0200 Gross per 24 hour  Intake 3076.93 ml  Output 165 ml  Net 2911.93 ml   Last Weight  Most recent  update: 2022-06-27  6:10 AM    Weight  156.8 kg (345 lb 10.9 oz)              Gen:  Exceptionally ill obese caucasian M HEENT: ETT, OGT, mucous membranes CV: Irregular rate and rhythm  PULM:  Mechanical ventilator ABD: Taught, distended EXT: Generalized edema Neuro: Sedated  PPS: 10%   This conversation/these recommendations were discussed with patient primary care team, Dr. Vaughan Browner  Billing based on MDM: High  ______________________________________________________ [place] Team Team Cell Phone: [phone] Please utilize secure chat with additional questions, if there is no response within 30 minutes please call the above phone number  Palliative Medicine Team providers are available by phone from 7am to 7pm daily and can be reached through the team cell phone.  Should this patient require assistance outside of these hours, please call the patient's attending physician.

## 2022-06-24 NOTE — Progress Notes (Signed)
Pt extubated to comfort care.  

## 2022-06-24 NOTE — Death Summary Note (Addendum)
  DEATH SUMMARY   Patient Details  Name: Andrew Fowler MRN: [id number] DOB: 23-Feb-1961  Admission/Discharge Information   Admit Date:  06/26/22  Date of Death: Date of Death: 2022/07/05  Time of Death: Time of Death: 1007  Length of Stay: 8  Referring Physician: Gust Rung, DO   Reason(s) for Hospitalization  Acute hypoxic respiratory failure   Diagnoses  Preliminary cause of death:  Acute on chronic hypoxic and hypercarbic respiratory failure COPD exacerbation due to community-acquired pneumonia Sepsis present on admission Septic Shock - present on admission  Non-COVID coronavirus viral infection Decompensated cirrhosis secondary to NASH Acute renal failure Persistent hypokalemia Type 2 diabetes Rheumatoid arthritis Severe protein calorie malnutrition, present on admission Anasarca Chronic thrombocytopenia, anemia DNR status, comfort measures Palliative care consult Tobacco use disorder  Secondary Diagnoses (including complications and co-morbidities):  Principal Problem:   Decompensated hepatic cirrhosis (HCC) Active Problems:   COPD (chronic obstructive pulmonary disease) (HCC)   AKI (acute kidney injury) (HCC)   Anasarca   Acute respiratory failure with hypoxia [facility])   Brief Hospital Course (including significant findings, care, treatment, and services provided and events leading to death)  [name] is a 62 y.o. year old male who continues to respiratory failure with hypoxia. States he has noticed more wheezing and productive cough.  Denies fever/chills.  Also noticed more swelling in legs and body and admits to gaining weight.  States he has been taking his home medications faithfully.  On 06/26/22 came to [facility] ED for further eval.  Vital stable.  Sats 95% on room air.  Labs showing significant hyponatremia, AKI, elevated liver enzymes , low albumin, LA 4.2 then 2.9.  Chronic thrombocytopenia 104.  Patient was given IV Lasix and some breathing treatments  which helped with breathing.  Started on steroids.  CXR showing perihilar opacities.  Started on rocephin/azithromycin. On 3/8 paracentesis was performed pulling 2.4L of clear yellow fluid.  Albumin given.  Spironolactone/Lasix held due to elevated creatinine.  Respiratory virus panel showed non-COVID coronavirus.   On 3/9 patient with worsening respiratory distress at 2 AM.  Patient sats dropped to 76% on 2 LNC.  Patient was placed on NRB with improvement in sats.  CXR showing worsening infiltrate versus edema bilaterally.  Patient transferred to progressive and given Lasix.  Patient only put out 300 mL UOP.  ABG 7.31, 42, 111, 21.  Patient with sats only 88% on NRB and still in respiratory distress.  PCCM consulted.   Respiratory distress and states BiPAP and was intubated.  He went into shock requiring pressors, antibiotics.  He had worsening multiorgan failure.  Decompensated liver failure, shock, worsening renal failure, hepatorenal syndrome. He was consulted and started on octreotide, midodrine, albumin and Lasix.  Lokelma was given for hyperkalemia.  He is not a candidate for renal replacement  Palliative care was consulted.  Will have family discussions and family felt that he would not want this quality of life and requested transition to comfort measures.  He was compassionately extubated and passed away on Jul 05, 2022.  Signature:   Chilton Greathouse MD [facility] See Amion for pager  If no response to pager , please call [phone] until 7pm After 7:00 pm call Elink  [phone] 06/27/2022, 8:03 AM

## 2022-06-24 NOTE — Progress Notes (Signed)
eLink Physician-Brief Progress Note Patient Name: Andrew Fowler DOB: Sep 12, 1960 MRN: [id number]   Date of Service  28-Jun-2022  HPI/Events of Note  K+ 6.2.  eICU Interventions  Hyperkalemia treatment protocol ordered.        [name] [name] June 28, 2022, 3:48 AM

## 2022-06-24 NOTE — Progress Notes (Signed)
   Jun 22, 2022 1022  Spiritual Encounters  Type of Visit Initial  Care provided to: [facility] partners present during encounter Nurse  Referral source Other (comment);Nurse (RN/NT/LPN)  Reason for visit End-of-life  OnCall Visit Yes  Spiritual Framework  Presenting Themes Rituals and practive;Impactful experiences and emotions;Other (comment)  Community/Connection Family  Family Stress Factors Major life changes  Interventions  Spiritual Care Interventions Made Prayer;Compassionate presence;Reflective listening;Narrative/life review  Intervention Outcomes  Outcomes Connection to spiritual care  Spiritual Care Plan  Spiritual Care Issues Still Outstanding No further spiritual care needs at this time (see row info)   Responded to call from nursing station for EOL, family present, 2 sisters and a brother. Reviewed family dynamics, discussed surviving brother's need for residency as he was patient's care giver. Discussed final arrangements. Remained with family while patient was being extubated.Prayed with family. Advised family to contact chaplain is any further assistance requested.

## 2022-06-24 NOTE — Progress Notes (Signed)
NAME:  Andrew Fowler, MRN:  [id number], DOB:  03-05-1961, LOS: 8 ADMISSION DATE:  06/05/2022, CONSULTATION DATE:  06/02/2022 REFERRING MD:  Dr Markus Jarvis, CHIEF COMPLAINT:   Acute hypoxic respiratory failure  History of Present Illness:   Patient is a 62 year old male with pertinent PMH NASH cirrhosis, COPD, OSA, RA, T2DM, severe obesity presents to [facility] ED on 3/7 with SOB.   Patient states he has been having progressively worsening SOB over the past 10 days.  States he has noticed more wheezing and productive cough.  Denies fever/chills.  Also noticed more swelling in legs and body and admits to gaining weight.  States he has been taking his home medications faithfully.  On 3/7 came to [facility] ED for further eval.  Vital stable.  Sats 95% on room air.  Labs showing significant hyponatremia, AKI, elevated liver enzymes , low albumin, LA 4.2 then 2.9.  Chronic thrombocytopenia 104.  Patient was given IV Lasix and some breathing treatments which helped with breathing.  Started on steroids.  CXR showing perihilar opacities.  Started on rocephin/azithromycin. On 3/8 paracentesis was performed pulling 2.4L of clear yellow fluid.  Albumin given.  Spironolactone/Lasix held due to elevated creatinine.   On 3/9 patient with worsening respiratory distress at 2 AM.  Patient sats dropped to 76% on 2 LNC.  Patient was placed on NRB with improvement in sats.  CXR showing worsening infiltrate versus edema bilaterally.  Patient transferred to progressive and given Lasix.  Patient only put out 300 mL UOP.  ABG 7.31, 42, 111, 21.  Patient with sats only 88% on NRB and still in respiratory distress.  PCCM consulted.  Pertinent  Medical History   Past Medical History:  Diagnosis Date   Abnormal laboratory test result 07/04/2011   Immunofixation electrophoresis 3/13 showed slightly restricted mobility in the IgG and kappa lanes and suggested repeat end of year.    Anxiety associated with depression 04/19/2011   On chronic benzos  and SSRI's. Gets panic attacks. Does not see mental health.   Chronic pain syndrome 04/19/2011   Combination of RA, obesity, OA, & DDD. Has seen Dr Sharol Given & s/p R total hip arthroplasty 2/2 OA & ? AVN. s/p L4-L5 laminectomy. Lumbar MRI 4/11 : L3-L4 mod central canal narrowing.  Cervical MRI 6/11 : multi-level DDD and L foraminal stenosis at C4-5 and C5-6.    Chronic venous insufficiency 2013   ECHO 2013 was normal. LFT's, creatinine, TSH all normal. Alb a bit low.   COPD (chronic obstructive pulmonary disease) ([place])    Per pt, he has a diagnosis of COPD. No PFT's. Uses Alb MDI less than once a month.   Diabetes mellitus    Non-insulin dependent Type II.   Edema 04/19/2011   ECHO 2013 was normal. LFT's, creatinine, TSH all normal. Alb a bit low. Likely 2/2 chronic venous insuff 2/2 weight.   Elevated liver function tests 05/17/2011   04/2010. Increased GGT (pt uses ETOH). AMA negative. ABD U/S limited but otherwise negative. Follow Alk phos level. AST & ALT also elevated. False + IgM Hep B Core Ab. Hep B viral load negative.   Same pattern during hospitalization 2010 : highest alkaline phosphatase was 355, AST 259, and ALT 871. ANA, rheumatoid factor, ceruloplasmin, CMV IgM, alpha antitrypsin, and AMA, all within normal limits.   Encounter for power mobility device assessment 03/14/2020   Erythrocytosis 07/04/2016   Gout    Pt has never had a crystal diagnosis.   Hepatitis B antibody  positive 2013   IgM Hep B core Ab + but Hepatitis B viral load negative.    Hyperlipidemia    At goal of LDL <100 with statin.   Hypertension    ACEI monotherapy   Leukocytosis 01/11/2014   Since 2010. Smear 2015 mild left shift. Is on prednisone for RA but increase started before leukocytosis.    NASH (nonalcoholic steatohepatitis), ? some EtOH contribution and fibrosis  05/17/2011   Elevated since 05/2008 during hospitalization. Relatively stable. Extensive W/U and no etiology but likely fatty liver 2/2 obesity despite  normal imaging.  Hepatitis - all negative. There was an initial + IgM Hep B core Ab but repeat testing was negative and Hep B viral load was negative.  TTG 06/15/2013  ANA x 2 (05/07/11 and 06/13/2008) ASMA x2 (07/18/2011 negative & weakly + at 21 on 05/27/2011 & nega   OA (osteoarthritis)    Obesity, Class III, BMI 40-49.9 (morbid obesity) ([place])    Obstructive sleep apnea 08/21/08   Sleep study AHI 16.4 with desat to 66%. CPAP of 17 decreased AHI to 0.9. Non-compliant with CPAP.   RA (rheumatoid arthritis) ([place]) 2013   Shortness of breath    Tobacco abuse    Per pt, he has a diagnosis of COPD. Need to locate PFT's.   Wheelchair bound    Significant Hospital Events: Including procedures, antibiotic start and stop dates in addition to other pertinent events   3/7 admitted to [facility] for shortness of breath, admitted to floors 3/9 worsening respiratory distress with hypoxia, transferred to ICU, started on BiPAP.  Respiratory worsening,-intubated.  Requiring pressors 3/15 worsening renal failure, hyperkalemia.  Renal consulted  Interim History / Subjective:   Worsening renal failure, hyperkalemia.  Renal consulted.  He is not a candidate for renal replacement therapy Started on midodrine, octreotide and albumin and Lasix for HRS.  Objective   Blood pressure (!) 128/46, pulse (!) 116, temperature 98.2 F (36.8 C), temperature source Oral, resp. rate 14, height 5\' 11"  (1.803 m), weight (!) 156.8 kg, SpO2 97 %. CVP:  [9 mmHg-12 mmHg] 12 mmHg  Vent Mode: PRVC FiO2 (%):  [40 %] 40 % Set Rate:  [16 bmp] 16 bmp Vt Set:  [600 mL] 600 mL PEEP:  [5 cmH20-8 cmH20] 8 cmH20 Pressure Support:  [5 [id number] cmH20] 12 cmH20 Plateau Pressure:  [22 cmH20-27 cmH20] 26 cmH20   Intake/Output Summary (Last 24 hours) at 06-20-2022 0738 Last data filed at 06-20-22 0600 Gross per 24 hour  Intake 3509.08 ml  Output 215 ml  Net 3294.08 ml   Filed Weights   06/06/22 0353 06/07/22 0511 06-20-22 0500  Weight: (!)  151.1 kg (!) 153.1 kg (!) 156.8 kg   Examination: Blood pressure (!) 128/46, pulse (!) 116, temperature 98.2 F (36.8 C), temperature source Oral, resp. rate 14, height 5\' 11"  (1.803 m), weight (!) 156.8 kg, SpO2 97 %. Gen:      No acute distress, obese, chronically ill-appearing HEENT:  EOMI, sclera anicteric Neck:     No masses; no thyromegaly Lungs:    Clear to auscultation bilaterally; normal respiratory effort CV:         Regular rate and rhythm; no murmurs Abd:   Distended abdomen, diminished bowel sounds Ext:    Anasarca, adequate peripheral perfusion Skin:      Petechial rash on the chest Neuro: Unresponsive  Lab/imaging reviewed Significant for sodium 134, potassium 6.4, creatinine 2.62 AST 79, ALT 66, total bilirubin 7, INR 1.5 WBC 39.5  Resolved Hospital Problem list    Assessment & Plan:  #Acute hypoxemic, hypercapnic respiratory failure #Pulmonary edema, COPD exacerbation, possible community-acquired pneumonia Non-COVID coronavirus on respiratory virus panel Continue vent support Weaning on PSV but not in the position to extubate due to worsening multiorgan failure and poor mental status Follow intermittent chest x-ray  #Decompensated cirrhosis secondary to NASH #Oliguric acute renal failure #Persistent hyperkalemia  Has worsening renal failure with persistent hyperkalemia Nephrology consulted yesterday and started on octreotide On midodrine, albumin and Lasix Continue with [facility] Not a candidate for renal replacement.  #Leukocytosis Likely going to sepsis On pressors now WBC count up to 39.5 today  #Abdominal distention #Constipation Continue bowel regimen Likely needs CT abdomen pelvis suspect he may not fit in the scanner and is unstable to transport right now  #Type II diabetes mellitus Sugars largely at goal, CBG 167 this morning. Tube feeds on hold and he is back on his home steroid regimen.  Continue Semglee, NovoLog  #Rheumatoid  arthritis Continue home prednisone  #Chronic thrombocytopenia #Anemia Platelets improved to 79 Monitor CBC Restart heparin subcu SCDs  #Tobacco use disorder -Continue nicotine patch  # Goals of care Discussed with brother and 2 sisters at bedside with palliative care He has declining status and would not like to be supported in current state They have requested transition to full comfort measures.  Best Practice (right click and "Reselect all SmartList Selections" daily)   Diet/type: tubefeeds DVT prophylaxis: SCD GI prophylaxis: PPI Lines: Central line Foley:  Yes, and it is still needed Code Status:  full code Last date of multidisciplinary goals of care discussion:  Signature:   The patient is critically ill with multiple organ system failure and requires high complexity decision making for assessment and support, frequent evaluation and titration of therapies, advanced monitoring, review of radiographic studies and interpretation of complex data.   Critical Care Time devoted to patient care services, exclusive of separately billable procedures, described in this note is 35 minutes.   Marshell Garfinkel MD [facility] See Amion for pager  If no response to pager , please call [phone] until 7pm After 7:00 pm call Elink  [phone] 2022-07-02, 7:38 AM

## 2022-06-24 NOTE — Progress Notes (Signed)
eLink Physician-Brief Progress Note Patient Name: Andrew Fowler DOB: November 05, 1960 MRN: [id number]   Date of Service  06-09-2022  HPI/Events of Note  K+ 6.4  eICU Interventions  Hyperkalemia treatment protocol ordered.        [name] Destany Severns 09-Jun-2022, 6:02 AM

## 2022-06-24 NOTE — Progress Notes (Signed)
Admit: 06/14/2022 LOS: 8  58M AKI + hyperkalemia in setting of decompensated cirrhosis, VDRF, AECOPD; PMH including RA, DM2,  Subjective:  Family meeting scheduled today 0900  Hyperkalemia overnight, K 6.2--> 6.4 --> 6.1 SCr increasing further, BUN 160 Lokelma 10gm x3 yesterday Remins on vent 40% FIO2 Remains on NE, midodrine, octreotide < 0.2L UOP yesterday, net 8.4L positive Sister from [facility] at bedside, discussed status WBC 17 to 39.5  03/15 0701 - 08-Jul-2022 0700 In: 3076.9 [I.V.:2425.8; NG/GT:346.7; IV Piggyback:304.4] Out: 165 [Urine:165]  Filed Weights   06/06/22 0353 06/07/22 0511 07-08-22 0500  Weight: (!) 151.1 kg (!) 153.1 kg (!) 156.8 kg    Scheduled Meds:  artificial tears   Both Eyes Q8H   atorvastatin  40 mg Per Tube Daily   Chlorhexidine Gluconate Cloth  6 each Topical Q0600   docusate  100 mg Per Tube BID   feeding supplement (PROSource TF20)  60 mL Per Tube BID   folic acid  1 mg Per Tube Daily   insulin aspart  0-20 Units Subcutaneous Q4H   insulin aspart  10 Units Subcutaneous Q4H   insulin glargine-yfgn  35 Units Subcutaneous Daily   ipratropium-albuterol  3 mL Nebulization Q4H   lactulose  30 g Per Tube QID   midodrine  15 mg Per Tube Q8H   multivitamin with minerals  1 tablet Per Tube Daily   octreotide  200 mcg Subcutaneous TID   mouth rinse  15 mL Mouth Rinse Q2H   pantoprazole (PROTONIX) IV  40 mg Intravenous Daily   polyethylene glycol  17 g Per Tube BID   predniSONE  5 mg Per Tube Q breakfast   QUEtiapine  50 mg Per Tube BID   sennosides  10 mL Per Tube BID   thiamine  100 mg Per Tube Daily   Continuous Infusions:  sodium chloride Stopped (06/02/22 0946)   dexmedetomidine (PRECEDEX) IV infusion 0.8 mcg/kg/hr (08-Jul-2022 0341)   feeding supplement (VITAL 1.5 CAL) 20 mL/hr at 07-08-2022 0200   fentaNYL infusion INTRAVENOUS 80 mcg/hr (Jul 08, 2022 0457)   furosemide 62 mL/hr at 07-08-2022 0200   norepinephrine (LEVOPHED) Adult infusion 14 mcg/min (Jul 08, 2022  0628)   PRN Meds:.fentaNYL, midazolam, mouth rinse  Current Labs: reviewed   Latest Reference Range & Units 06/06/22 17:39  Sodium, Urine mmol/L <10    Physical Exam:  Blood pressure (!) 131/48, pulse (!) 116, temperature 98.2 F (36.8 C), temperature source Oral, resp. rate 12, height 5\' 11"  (1.803 m), weight (!) 156.8 kg, SpO2 96 %. Obese male intubated and sedated Tachy, regular, no rub Coarse bs b/l Diffuse pitting edema, 4+ including above waist ETT in OP Distended abdomen, quiet BS Sedated  A Anuric AoCKD3 likely 2/2 HRS I on NE, Midodrine, Octreotide Persistent Hyperkalemia Decompensated NASH Cirrhosis; ascites AHRF / VDRF; CAP; AECOPD per CCM Abd distension DM2 RA on chronic prednisone Tobacco user Worsening leukocytosis  P Grim prognosis Given decompensated liver disease without an obvious transplant path I have strongly recommended against any use of RRT Schedule lokelma 10gm TID Trend K Medication Issues; Preferred narcotic agents for pain control are hydromorphone, fentanyl, and methadone. Morphine should not be used.  Baclofen should be avoided Avoid oral sodium phosphate and magnesium citrate based laxatives / bowel preps    Pearson Grippe MD July 08, 2022, 7:07 AM  Recent Labs  Lab 06/05/22 1638 06/06/22 0203 06/06/22 0515 06/07/22 0427 06/07/22 [id number] 07-08-2022 0202 2022-07-08 0350 07-08-2022 0441  NA 132* 132*   < > 133*  --  131* 134*  --   K 5.9* 6.1*   < > 5.8*   < > 6.2* 6.4* 6.1*  CL 101 102   < > 103  --  97* 98  --   CO2 22 23   < > 21*  --  21* 21*  --   GLUCOSE 296* 305*   < > 220*  --  170* 155*  --   BUN 130* 138*   < > 155*  --  159* 160*  --   CREATININE 2.03* 2.12*   < > 2.12*  --  2.62* 2.62*  --   CALCIUM 8.6* 8.5*   < > 8.6*  --  8.9 8.8*  --   PHOS 4.5 4.8*  --  4.9*  --   --   --   --    < > = values in this interval not displayed.    Recent Labs  Lab 06/01/22 0610 06/01/22 0731 06/06/22 0203 06/06/22 1040 06/07/22 0427  07/04/22 0202  WBC 18.4*   < > 14.1*  --  17.5* 39.5*  NEUTROABS 15.3*  --   --   --   --   --   HGB 12.6*   < > 11.2* 11.6* 10.9* 11.6*  HCT 34.2*   < > 32.1* 34.0* 32.4* 34.5*  MCV 89.8   < > 94.4  --  95.6 97.2  PLT 100*   < > 47*  --  45* 79*   < > = values in this interval not displayed.

## 2022-06-24 DEATH — deceased

## 2022-07-24 ENCOUNTER — Ambulatory Visit: Payer: Medicaid Other | Admitting: Internal Medicine
